# Patient Record
Sex: Female | Born: 1960 | Race: White | Hispanic: No | Marital: Married | Smoking: Former smoker
Health system: Southern US, Community
[De-identification: ages and names within clinical notes are randomized; demographics above are authoritative.]

## PROBLEM LIST (undated history)

## (undated) DIAGNOSIS — M797 Fibromyalgia: Secondary | ICD-10-CM

## (undated) DIAGNOSIS — F430 Acute stress reaction: Secondary | ICD-10-CM

## (undated) DIAGNOSIS — I10 Essential (primary) hypertension: Secondary | ICD-10-CM

## (undated) DIAGNOSIS — F329 Major depressive disorder, single episode, unspecified: Secondary | ICD-10-CM

## (undated) DIAGNOSIS — J329 Chronic sinusitis, unspecified: Secondary | ICD-10-CM

## (undated) DIAGNOSIS — E785 Hyperlipidemia, unspecified: Secondary | ICD-10-CM

## (undated) DIAGNOSIS — K219 Gastro-esophageal reflux disease without esophagitis: Secondary | ICD-10-CM

## (undated) DIAGNOSIS — G4733 Obstructive sleep apnea (adult) (pediatric): Secondary | ICD-10-CM

## (undated) DIAGNOSIS — F32A Depression, unspecified: Secondary | ICD-10-CM

## (undated) DIAGNOSIS — E669 Obesity, unspecified: Secondary | ICD-10-CM

## (undated) DIAGNOSIS — F419 Anxiety disorder, unspecified: Secondary | ICD-10-CM

## (undated) DIAGNOSIS — A6920 Lyme disease, unspecified: Secondary | ICD-10-CM

## (undated) DIAGNOSIS — R918 Other nonspecific abnormal finding of lung field: Secondary | ICD-10-CM

## (undated) DIAGNOSIS — IMO0002 Reserved for concepts with insufficient information to code with codable children: Secondary | ICD-10-CM

## (undated) HISTORY — DX: Chronic sinusitis, unspecified: J32.9

## (undated) HISTORY — DX: Reserved for concepts with insufficient information to code with codable children: IMO0002

## (undated) HISTORY — PX: TUBAL LIGATION: SHX77

## (undated) HISTORY — DX: Fibromyalgia: M79.7

## (undated) HISTORY — DX: Hyperlipidemia, unspecified: E78.5

## (undated) HISTORY — DX: Acute stress reaction: F43.0

## (undated) HISTORY — DX: Obesity, unspecified: E66.9

## (undated) HISTORY — DX: Anxiety disorder, unspecified: F41.9

## (undated) HISTORY — DX: Obstructive sleep apnea (adult) (pediatric): G47.33

## (undated) HISTORY — DX: Gastro-esophageal reflux disease without esophagitis: K21.9

---

## 1997-09-18 ENCOUNTER — Other Ambulatory Visit: Admission: RE | Admit: 1997-09-18 | Discharge: 1997-09-18 | Payer: Self-pay | Admitting: Obstetrics and Gynecology

## 1998-01-19 ENCOUNTER — Emergency Department (HOSPITAL_COMMUNITY): Admission: EM | Admit: 1998-01-19 | Discharge: 1998-01-19 | Payer: Self-pay | Admitting: Emergency Medicine

## 1998-10-21 ENCOUNTER — Other Ambulatory Visit: Admission: RE | Admit: 1998-10-21 | Discharge: 1998-10-21 | Payer: Self-pay | Admitting: Obstetrics & Gynecology

## 1999-01-13 ENCOUNTER — Encounter: Admission: RE | Admit: 1999-01-13 | Discharge: 1999-01-13 | Payer: Self-pay | Admitting: Gastroenterology

## 1999-01-13 ENCOUNTER — Encounter: Payer: Self-pay | Admitting: Gastroenterology

## 1999-03-11 ENCOUNTER — Other Ambulatory Visit: Admission: RE | Admit: 1999-03-11 | Discharge: 1999-03-11 | Payer: Self-pay | Admitting: Obstetrics & Gynecology

## 1999-05-02 ENCOUNTER — Ambulatory Visit (HOSPITAL_COMMUNITY): Admission: RE | Admit: 1999-05-02 | Discharge: 1999-05-02 | Payer: Self-pay | Admitting: Obstetrics & Gynecology

## 1999-05-02 ENCOUNTER — Encounter (INDEPENDENT_AMBULATORY_CARE_PROVIDER_SITE_OTHER): Payer: Self-pay | Admitting: Specialist

## 2000-03-01 HISTORY — PX: LAPAROSCOPY: SHX197

## 2002-04-12 ENCOUNTER — Other Ambulatory Visit: Admission: RE | Admit: 2002-04-12 | Discharge: 2002-04-12 | Payer: Self-pay | Admitting: Obstetrics & Gynecology

## 2003-07-24 ENCOUNTER — Other Ambulatory Visit: Admission: RE | Admit: 2003-07-24 | Discharge: 2003-07-24 | Payer: Self-pay | Admitting: Obstetrics & Gynecology

## 2003-12-23 ENCOUNTER — Ambulatory Visit (HOSPITAL_COMMUNITY): Admission: RE | Admit: 2003-12-23 | Discharge: 2003-12-23 | Payer: Self-pay | Admitting: Pulmonary Disease

## 2004-01-14 ENCOUNTER — Ambulatory Visit: Payer: Self-pay | Admitting: Adult Health

## 2004-06-07 ENCOUNTER — Emergency Department (HOSPITAL_COMMUNITY): Admission: EM | Admit: 2004-06-07 | Discharge: 2004-06-07 | Payer: Self-pay | Admitting: Emergency Medicine

## 2004-08-19 ENCOUNTER — Other Ambulatory Visit: Admission: RE | Admit: 2004-08-19 | Discharge: 2004-08-19 | Payer: Self-pay | Admitting: Obstetrics & Gynecology

## 2004-11-27 ENCOUNTER — Ambulatory Visit: Payer: Self-pay | Admitting: Pulmonary Disease

## 2005-01-24 ENCOUNTER — Emergency Department (HOSPITAL_COMMUNITY): Admission: EM | Admit: 2005-01-24 | Discharge: 2005-01-25 | Payer: Self-pay | Admitting: Emergency Medicine

## 2005-05-31 ENCOUNTER — Ambulatory Visit: Payer: Self-pay | Admitting: Pulmonary Disease

## 2005-12-27 ENCOUNTER — Ambulatory Visit: Payer: Self-pay | Admitting: Pulmonary Disease

## 2006-03-15 ENCOUNTER — Ambulatory Visit: Payer: Self-pay | Admitting: Pulmonary Disease

## 2006-09-13 ENCOUNTER — Ambulatory Visit: Payer: Self-pay | Admitting: Pulmonary Disease

## 2006-09-13 LAB — CONVERTED CEMR LAB
ALT: 25 units/L (ref 0–35)
AST: 20 units/L (ref 0–37)
Albumin: 4.3 g/dL (ref 3.5–5.2)
BUN: 9 mg/dL (ref 6–23)
Basophils Relative: 0.5 % (ref 0.0–1.0)
CO2: 30 meq/L (ref 19–32)
Calcium: 9.6 mg/dL (ref 8.4–10.5)
Chloride: 104 meq/L (ref 96–112)
Creatinine, Ser: 0.9 mg/dL (ref 0.4–1.2)
GFR calc non Af Amer: 72 mL/min
Glucose, Bld: 101 mg/dL — ABNORMAL HIGH (ref 70–99)
Lymphocytes Relative: 21.2 % (ref 12.0–46.0)
MCHC: 35.3 g/dL (ref 30.0–36.0)
MCV: 84.2 fL (ref 78.0–100.0)
Monocytes Absolute: 0.5 10*3/uL (ref 0.2–0.7)
Neutro Abs: 5.1 10*3/uL (ref 1.4–7.7)
RBC: 4.74 M/uL (ref 3.87–5.11)
Sed Rate: 22 mm/hr (ref 0–25)
Sodium: 142 meq/L (ref 135–145)
Total Bilirubin: 0.7 mg/dL (ref 0.3–1.2)
Total Protein: 7.6 g/dL (ref 6.0–8.3)
WBC: 7.2 10*3/uL (ref 4.5–10.5)

## 2006-09-15 ENCOUNTER — Ambulatory Visit: Payer: Self-pay | Admitting: Cardiology

## 2006-09-30 HISTORY — PX: VENTRAL HERNIA REPAIR: SHX424

## 2006-10-26 ENCOUNTER — Ambulatory Visit (HOSPITAL_BASED_OUTPATIENT_CLINIC_OR_DEPARTMENT_OTHER): Admission: RE | Admit: 2006-10-26 | Discharge: 2006-10-26 | Payer: Self-pay | Admitting: General Surgery

## 2007-02-10 ENCOUNTER — Telehealth (INDEPENDENT_AMBULATORY_CARE_PROVIDER_SITE_OTHER): Payer: Self-pay | Admitting: *Deleted

## 2007-03-02 HISTORY — PX: ENDOMETRIAL ABLATION: SHX621

## 2007-03-05 ENCOUNTER — Emergency Department (HOSPITAL_COMMUNITY): Admission: EM | Admit: 2007-03-05 | Discharge: 2007-03-05 | Payer: Self-pay | Admitting: Emergency Medicine

## 2007-04-14 ENCOUNTER — Ambulatory Visit: Payer: Self-pay | Admitting: Pulmonary Disease

## 2007-04-14 DIAGNOSIS — J309 Allergic rhinitis, unspecified: Secondary | ICD-10-CM | POA: Insufficient documentation

## 2007-04-14 DIAGNOSIS — K219 Gastro-esophageal reflux disease without esophagitis: Secondary | ICD-10-CM | POA: Insufficient documentation

## 2007-04-14 LAB — CONVERTED CEMR LAB: Influenza B Ag: NEGATIVE

## 2007-05-22 ENCOUNTER — Ambulatory Visit: Payer: Self-pay | Admitting: Pulmonary Disease

## 2007-05-22 DIAGNOSIS — M797 Fibromyalgia: Secondary | ICD-10-CM | POA: Insufficient documentation

## 2007-05-22 DIAGNOSIS — J4540 Moderate persistent asthma, uncomplicated: Secondary | ICD-10-CM | POA: Insufficient documentation

## 2007-05-22 DIAGNOSIS — E785 Hyperlipidemia, unspecified: Secondary | ICD-10-CM | POA: Insufficient documentation

## 2007-05-22 DIAGNOSIS — IMO0002 Reserved for concepts with insufficient information to code with codable children: Secondary | ICD-10-CM | POA: Insufficient documentation

## 2007-05-23 ENCOUNTER — Encounter: Payer: Self-pay | Admitting: Pulmonary Disease

## 2007-05-24 ENCOUNTER — Ambulatory Visit: Payer: Self-pay | Admitting: Pulmonary Disease

## 2007-06-04 LAB — CONVERTED CEMR LAB
AST: 22 units/L (ref 0–37)
Alkaline Phosphatase: 53 units/L (ref 39–117)
BUN: 9 mg/dL (ref 6–23)
Bilirubin, Direct: 0.1 mg/dL (ref 0.0–0.3)
CO2: 28 meq/L (ref 19–32)
Chloride: 108 meq/L (ref 96–112)
Cholesterol: 201 mg/dL (ref 0–200)
GFR calc non Af Amer: 82 mL/min
HCT: 41.1 % (ref 36.0–46.0)
HDL: 40.6 mg/dL (ref >39.0)
MCV: 84.7 fL (ref 78.0–100.0)
Monocytes Relative: 6.7 % (ref 3.0–11.0)
RBC: 4.85 M/uL (ref 3.87–5.11)
Total Bilirubin: 0.5 mg/dL (ref 0.3–1.2)
Total CHOL/HDL Ratio: 5
Total Protein: 6.9 g/dL (ref 6.0–8.3)
VLDL: 41 mg/dL — ABNORMAL HIGH (ref 0–40)

## 2007-06-06 ENCOUNTER — Encounter (INDEPENDENT_AMBULATORY_CARE_PROVIDER_SITE_OTHER): Payer: Self-pay | Admitting: *Deleted

## 2007-09-14 ENCOUNTER — Ambulatory Visit: Payer: Self-pay | Admitting: Internal Medicine

## 2007-10-12 ENCOUNTER — Telehealth: Payer: Self-pay | Admitting: Pulmonary Disease

## 2008-04-26 ENCOUNTER — Encounter: Payer: Self-pay | Admitting: Pulmonary Disease

## 2008-07-16 ENCOUNTER — Ambulatory Visit: Payer: Self-pay | Admitting: Pulmonary Disease

## 2008-12-26 ENCOUNTER — Ambulatory Visit: Payer: Self-pay | Admitting: Pulmonary Disease

## 2008-12-27 DIAGNOSIS — E559 Vitamin D deficiency, unspecified: Secondary | ICD-10-CM | POA: Insufficient documentation

## 2008-12-27 LAB — CONVERTED CEMR LAB
Basophils Relative: 1.2 % (ref 0.0–3.0)
Bilirubin, Direct: 0.1 mg/dL (ref 0.0–0.3)
CO2: 27 meq/L (ref 19–32)
Calcium: 9 mg/dL (ref 8.4–10.5)
Eosinophils Absolute: 0.2 10*3/uL (ref 0.0–0.7)
GFR calc non Af Amer: 71.16 mL/min (ref >60)
Glucose, Bld: 109 mg/dL — ABNORMAL HIGH (ref 70–99)
HCT: 43.3 % (ref 36.0–46.0)
Hemoglobin, Urine: NEGATIVE
Hemoglobin: 14.8 g/dL (ref 12.0–15.0)
Lymphocytes Relative: 24.3 % (ref 12.0–46.0)
MCV: 88.5 fL (ref 78.0–100.0)
Monocytes Relative: 7.2 % (ref 3.0–12.0)
Nitrite: NEGATIVE
Platelets: 294 10*3/uL (ref 150.0–400.0)
Potassium: 4.1 meq/L (ref 3.5–5.1)
RBC: 4.89 M/uL (ref 3.87–5.11)
Specific Gravity, Urine: >=1.03 (ref 1.000–1.030)
VLDL: 45 mg/dL — ABNORMAL HIGH (ref 0.0–40.0)
WBC: 6.8 10*3/uL (ref 4.5–10.5)
pH: 5.5 (ref 5.0–8.0)

## 2009-03-30 ENCOUNTER — Emergency Department (HOSPITAL_COMMUNITY): Admission: EM | Admit: 2009-03-30 | Discharge: 2009-03-30 | Payer: Self-pay | Admitting: Emergency Medicine

## 2009-04-03 ENCOUNTER — Ambulatory Visit: Payer: Self-pay | Admitting: Pulmonary Disease

## 2009-04-29 ENCOUNTER — Telehealth (INDEPENDENT_AMBULATORY_CARE_PROVIDER_SITE_OTHER): Payer: Self-pay | Admitting: *Deleted

## 2009-04-30 ENCOUNTER — Ambulatory Visit: Payer: Self-pay | Admitting: Pulmonary Disease

## 2009-04-30 DIAGNOSIS — R079 Chest pain, unspecified: Secondary | ICD-10-CM | POA: Insufficient documentation

## 2009-05-02 LAB — CONVERTED CEMR LAB
Basophils Absolute: 0 10*3/uL (ref 0.0–0.1)
CO2: 30 meq/L (ref 19–32)
Chloride: 107 meq/L (ref 96–112)
Creatinine, Ser: 0.8 mg/dL (ref 0.4–1.2)
Eosinophils Absolute: 0.2 10*3/uL (ref 0.0–0.7)
Eosinophils Relative: 2.9 % (ref 0.0–5.0)
Glucose, Bld: 113 mg/dL — ABNORMAL HIGH (ref 70–99)
Hemoglobin: 13.6 g/dL (ref 12.0–15.0)
Hgb A1c MFr Bld: 5.9 % (ref 4.6–6.5)
Lymphocytes Relative: 25.6 % (ref 12.0–46.0)
Monocytes Absolute: 0.4 10*3/uL (ref 0.1–1.0)
Neutrophils Relative %: 63.6 % (ref 43.0–77.0)
Potassium: 4.2 meq/L (ref 3.5–5.1)
RBC: 4.64 M/uL (ref 3.87–5.11)
Sodium: 142 meq/L (ref 135–145)
TSH: 1.72 microintl units/mL (ref 0.35–5.50)
WBC: 5.9 10*3/uL (ref 4.5–10.5)

## 2009-05-08 ENCOUNTER — Ambulatory Visit: Payer: Self-pay | Admitting: Cardiology

## 2009-05-08 DIAGNOSIS — R0602 Shortness of breath: Secondary | ICD-10-CM | POA: Insufficient documentation

## 2009-05-21 ENCOUNTER — Ambulatory Visit: Payer: Self-pay | Admitting: Cardiology

## 2009-05-26 LAB — CONVERTED CEMR LAB
Cholesterol: 164 mg/dL (ref 0–200)
VLDL: 52.6 mg/dL — ABNORMAL HIGH (ref 0.0–40.0)

## 2009-05-28 ENCOUNTER — Encounter: Payer: Self-pay | Admitting: Cardiology

## 2009-05-28 ENCOUNTER — Ambulatory Visit: Payer: Self-pay | Admitting: Cardiology

## 2009-05-28 ENCOUNTER — Ambulatory Visit: Payer: Self-pay

## 2009-05-28 ENCOUNTER — Ambulatory Visit (HOSPITAL_COMMUNITY): Admission: RE | Admit: 2009-05-28 | Discharge: 2009-05-28 | Payer: Self-pay | Admitting: Cardiology

## 2009-05-28 ENCOUNTER — Ambulatory Visit: Payer: Self-pay | Admitting: Internal Medicine

## 2009-05-29 ENCOUNTER — Ambulatory Visit: Payer: Self-pay | Admitting: Cardiology

## 2009-06-05 ENCOUNTER — Ambulatory Visit: Payer: Self-pay | Admitting: Adult Health

## 2009-06-26 ENCOUNTER — Encounter: Payer: Self-pay | Admitting: Pulmonary Disease

## 2009-06-26 ENCOUNTER — Ambulatory Visit (HOSPITAL_BASED_OUTPATIENT_CLINIC_OR_DEPARTMENT_OTHER): Admission: RE | Admit: 2009-06-26 | Discharge: 2009-06-26 | Payer: Self-pay | Admitting: Pulmonary Disease

## 2009-06-28 ENCOUNTER — Ambulatory Visit: Payer: Self-pay | Admitting: Pulmonary Disease

## 2009-07-23 ENCOUNTER — Encounter: Payer: Self-pay | Admitting: Pulmonary Disease

## 2009-07-24 ENCOUNTER — Encounter: Admission: RE | Admit: 2009-07-24 | Discharge: 2009-07-24 | Payer: Self-pay | Admitting: General Surgery

## 2009-08-20 ENCOUNTER — Encounter: Payer: Self-pay | Admitting: Pulmonary Disease

## 2009-08-25 ENCOUNTER — Telehealth: Payer: Self-pay | Admitting: Pulmonary Disease

## 2009-08-29 HISTORY — PX: OTHER SURGICAL HISTORY: SHX169

## 2009-09-08 ENCOUNTER — Ambulatory Visit: Payer: Self-pay | Admitting: Pulmonary Disease

## 2009-09-08 DIAGNOSIS — K439 Ventral hernia without obstruction or gangrene: Secondary | ICD-10-CM | POA: Insufficient documentation

## 2009-09-16 ENCOUNTER — Inpatient Hospital Stay (HOSPITAL_COMMUNITY): Admission: RE | Admit: 2009-09-16 | Discharge: 2009-09-19 | Payer: Self-pay | Admitting: General Surgery

## 2009-09-26 ENCOUNTER — Ambulatory Visit: Payer: Self-pay | Admitting: Pulmonary Disease

## 2009-09-26 ENCOUNTER — Ambulatory Visit: Payer: Self-pay | Admitting: Internal Medicine

## 2009-09-26 ENCOUNTER — Encounter: Payer: Self-pay | Admitting: Adult Health

## 2009-10-22 ENCOUNTER — Encounter: Payer: Self-pay | Admitting: Pulmonary Disease

## 2009-11-12 ENCOUNTER — Encounter: Payer: Self-pay | Admitting: Pulmonary Disease

## 2010-01-09 ENCOUNTER — Encounter: Payer: Self-pay | Admitting: Pulmonary Disease

## 2010-02-06 ENCOUNTER — Encounter
Admission: RE | Admit: 2010-02-06 | Discharge: 2010-02-06 | Payer: Self-pay | Source: Home / Self Care | Attending: General Surgery | Admitting: General Surgery

## 2010-02-11 ENCOUNTER — Encounter: Payer: Self-pay | Admitting: Pulmonary Disease

## 2010-02-26 ENCOUNTER — Telehealth: Payer: Self-pay | Admitting: Pulmonary Disease

## 2010-03-20 ENCOUNTER — Encounter: Payer: Self-pay | Admitting: Pulmonary Disease

## 2010-03-31 NOTE — Assessment & Plan Note (Signed)
Summary: NP follow up   Primary Provider/Referring Provider:  Dr. Murray Hodgkins  CC:  64month follow up - states doing well, is taking amoxicillan for strep throat given by dr. Shirlee Latch.  pt states she has the sensation of something in the back of her throat that feels as though its closing something off when she sneezes and coughs x2weeks, and has been told in the past she may need a sleep study.  History of Present Illness: 50 year old female patient has a known history of asthmatic bronchitis.      April 03, 2009 --Presents for an acute office visit. Pt c/o losing voice last 1 week ago.w/  productive cough with green mucus, nasal drainage clear to light green, S.O.B. Pt states has been in contact with numerous family members with virus. Pt was seen @ [facility] 03/30/09  and was told she has a virus. No tx given. REC for OTC meds. .Cough is getting worse, cant sleep.    April 30, 2009--Presents for an acute office visit. Complains of shortness of breath , fatigue, onset of chest pain x 3 over last 2 weeks lasting about 15 minutes and nausea denies left arm pain. She is under alot of stress w/ work and raising 2 grandchildren-5 yr old/20 months . Last visit w/ asthmatic bronchitis tx w/ omnicef and prednisone taper. She is improving but still has lingering dry cough. Does have intermittent heatburn w/ burning into throat. Lots of family sick w/ virus. Episode occurred yesterday prior to lunch, felt weak,  mid sternal chest pain-gripping pain,  lasted 15 min. took zantac which did helped. Feels tired alot. and thirsty.   June 05, 2009--Presents for 1 month follow up. She is feeling better. chest pain resolved. /w prilosec.  Last visit w/ multiple somatic complaints. She atypical chest pain sent to cards w/ neg stress echo for ischemia, did show mod diastolic dysfunction. EF 60-65%. Recommended to start fish oil. She was tx for GERD w/ Prilosec daily .  Labs were unremarkable and neg for DM . Is  concerned that she has sleep apnea, she snores alot, tired during daytime, feels like she could nap after lunch. Denies chest pain, dyspnea, orthopnea, hemoptysis, fever, n/v/d, edema, headache.   Medications Prior to Update: 1)  Allegra 180 Mg  Tabs (Fexofenadine Hcl) .... Take 1 Tablet By Mouth Once A Day 2)  Advair Diskus 250-50 Mcg/dose  Misc (Fluticasone-Salmeterol) .... Inhale 1 Puff Two Times A Day 3)  Singulair 10 Mg  Tabs (Montelukast Sodium) .... Take 1 Tab By Mouth Once Daily.Marland KitchenMarland Kitchen 4)  Proventil Hfa 108 (90 Base) Mcg/act  Aers (Albuterol Sulfate) .... Inhale 2 Puffs Every 4-6 Hours As Needed For Wheezing... 5)  Cymbalta 60 Mg  Cpep (Duloxetine Hcl) .... Take 1 Tablet By Mouth Once A Day 6)  Nabumetone 750 Mg  Tabs (Nabumetone) .[name].. 1 Tab By Mouth Two Times A Day As Needed For Arthritis Pain... 7)  Vitamin D3 5000 Unit/ml Liqd (Cholecalciferol) .... Take As Directed By Rober Minion... 8)  Ibuprofen 200 Mg Tabs (Ibuprofen) .... Take 1 Tablet By Mouth Every 6 Hours As Needed 9)  Omeprazole 20 Mg Cpdr (Omeprazole) .[name].. 1 By Mouth Once Daily 10)  Fish Oil 1000 Mg Caps (Omega-3 Fatty Acids) .... 2 Daily 11)  Amoxicillin 875 Mg Tabs (Amoxicillin) .... One Tablet Twice A Day For 10days  Current Medications (verified): 1)  Allegra 180 Mg  Tabs (Fexofenadine Hcl) .... Take 1 Tablet By Mouth Once A Day 2)  Advair Diskus 250-50 Mcg/dose  Misc (Fluticasone-Salmeterol) .... Inhale 1 Puff Two Times A Day 3)  Singulair 10 Mg  Tabs (Montelukast Sodium) .... Take 1 Tab By Mouth Once Daily.Marland KitchenMarland Kitchen 4)  Proventil Hfa 108 (90 Base) Mcg/act  Aers (Albuterol Sulfate) .... Inhale 2 Puffs Every 4-6 Hours As Needed For Wheezing... 5)  Cymbalta 60 Mg  Cpep (Duloxetine Hcl) .... Take 1 Tablet By Mouth Once A Day 6)  Nabumetone 750 Mg  Tabs (Nabumetone) .[name].. 1 Tab By Mouth Two Times A Day As Needed For Arthritis Pain... 7)  Vitamin D3 5000 Unit/ml Liqd (Cholecalciferol) .... Take As Directed By Rober Minion... 8)   Ibuprofen 200 Mg Tabs (Ibuprofen) .... Take 1 Tablet By Mouth Every 6 Hours As Needed 9)  Omeprazole 20 Mg Cpdr (Omeprazole) .[name].. 1 By Mouth Once Daily 10)  Fish Oil 1000 Mg Caps (Omega-3 Fatty Acids) .... 2 Daily 11)  Amoxicillin 875 Mg Tabs (Amoxicillin) .... One Tablet Twice A Day For 10days  Allergies (verified): 1)  ! Codeine  Past History:  Family History: Last updated: 05/19/09 Father, estranged- died age 61 w/ Alzheimer's disease. Mother alive age 94 w/ DM, CHF (unsure if she had an MI) 2 Siblings: Brother died age 46 after assault Sister age 83 in good health several half-siblings as well... Grandmother-passed 72 w/ MI Lung cancer in grandmother  Social History: Last updated: 2009-05-19 Married, husb= [place], 65yrs. 2 Children from prev marriage & one daughter on crack She is raising 2 grandkids. Ex-smoker, quit 2006 Social alcohol Caffeine daily  Risk Factors: Smoking Status: quit (04/14/2007)  Past Medical History: 1. hx of SINUSITIS,  (ICD-461.9) 2. ALLERGIC RHINITIS (ICD-477.9) 3. ASTHMA (ICD-493.90) 4. DYSLIPIDEMIA (ICD-272.4) TC 164, HDL 33, LDL 99, TG 262--rx fish oil.  5. GERD (ICD-530.81) 6. DEGENERATIVE DISC DISEASE (ICD-722.6) 7. FIBROMYALGIA (ICD-729.1) 8. VITAMIN D DEFICIENCY (ICD-268.9) 9. STRESS DISORDER (ICD-V62.89) 10.  Obesity 11.  Stress echo (3/11): No evidence for ischemia or infarction.  EF 60-65%, moderate diastolic dysfunction, no significant valvular disease, normal RV.  -TC 164, HDL 33, TG 263, LDL 99 Vital Signs:  Patient profile:   50 year old female Height:      64 inches Weight:      221 pounds BMI:     38.07 O2 Sat:      98 % on Room air Temp:     98.5 degrees F oral Pulse rate:   105 / minute BP sitting:   106 / 72  (left arm) Cuff size:   large  Vitals Entered By: [name] (June 05, 2009 3:04 PM)  O2 Flow:  Room air CC: 56month follow up - states doing well, is taking amoxicillan for strep throat given  by dr. Shirlee Latch.  pt states she has the sensation of something in the back of her throat that feels as though its closing something off when she sneezes and coughs x2weeks, has been told in the past she may need a sleep study Is Patient Diabetic? No Comments Medications reviewed with patient Daytime contact number verified with patient. [name]  June 05, 2009 3:05 PM       Physical Exam  Additional Exam:  WD, Overweight, 50 y/o WF in NAD... GENERAL:  Alert & oriented; pleasant & cooperative... HEENT:  [place]/AT,   EACs-clear, TMs-wnl, NOSE clear , THROAT-clear & wnl. NECK:  Supple w/ full ROM; no JVD; normal carotid impulses w/o bruits; no thyromegaly or nodules palpated; no lymphadenopathy. CHEST:  CTA  bilaterally HEART:  Regular Rhythm; without murmurs/ rubs/ or gallops. ABDOMEN:  Soft & nontender; normal bowel sounds; no organomegaly or masses detected. EXT: without deformities or arthritic changes; no varicose veins/ +venous insuffic/ tr edema.     Impression & Recommendations:  Problem # 1:  GERD (ICD-530.81) Advised on GERD diet.  cont on prilosec once daily  Her updated medication list for this problem includes:    Omeprazole 20 Mg Cpdr (Omeprazole) .[name]... 1 by mouth once daily  Problem # 2:  DYSLIPIDEMIA (ICD-272.4) low fat chol diet.  diet and exercise.  Labs Reviewed: SGOT: 23 (12/26/2008)   SGPT: 28 (12/26/2008)   HDL:33.10 (05/21/2009), 38.70 (12/26/2008)  LDL:DEL (05/24/2007)  Chol:164 (05/21/2009), 217 (12/26/2008)  Trig:263.0 (05/21/2009), 225.0 (12/26/2008)  Problem # 3:  HYPERSOMNIA (ICD-780.54)  Daytime hypersolomence and fatigue will set up for sleep study.   Orders: Est. Patient Level IV (16109) Sleep Disorder Referral (Sleep Disorder)  Complete Medication List: 1)  Allegra 180 Mg Tabs (Fexofenadine hcl) .... Take 1 tablet by mouth once a day 2)  Advair Diskus 250-50 Mcg/dose Misc (Fluticasone-salmeterol) .... Inhale 1 puff two times a day 3)   Singulair 10 Mg Tabs (Montelukast sodium) .... Take 1 tab by mouth once daily.Marland KitchenMarland Kitchen 4)  Proventil Hfa 108 (90 Base) Mcg/act Aers (Albuterol sulfate) .... Inhale 2 puffs every 4-6 hours as needed for wheezing... 5)  Cymbalta 60 Mg Cpep (Duloxetine hcl) .... Take 1 tablet by mouth once a day 6)  Nabumetone 750 Mg Tabs (Nabumetone) .[name].. 1 tab by mouth two times a day as needed for arthritis pain.Marland KitchenMarland Kitchen 7)  Vitamin D3 5000 Unit/ml Liqd (Cholecalciferol) .... Take as directed by drdeveshwar... 8)  Ibuprofen 200 Mg Tabs (Ibuprofen) .... Take 1 tablet by mouth every 6 hours as needed 9)  Omeprazole 20 Mg Cpdr (Omeprazole) .[name].. 1 by mouth once daily 10)  Fish Oil 1000 Mg Caps (Omega-3 fatty acids) .... 2 daily 11)  Amoxicillin 875 Mg Tabs (Amoxicillin) .... One tablet twice a day for 10days  Patient Instructions: 1)  GERD diet 2)  Continue on  Omeprazole 20mg  once daily before meal 3)   We are setting you up a sleep study , I will call with resutls.  4)  follow up Dr. Kriste Basque in 3 months  5)  Increase activity as tolerated 6)  Please contact office for sooner follow up if symptoms do not improve or worsen

## 2010-03-31 NOTE — Letter (Signed)
Summary: [facility]   Imported By: Sherian Rein 08/18/2009 11:57:15  _____________________________________________________________________  External Attachment:    Type:   Image     Comment:   External Document

## 2010-03-31 NOTE — Assessment & Plan Note (Signed)
Summary: NP6/CHEST PAIN/INCREASE RISK FACTOR   Primary Provider:  Dr. Murray Hodgkins   History of Present Illness: 50 yo with history of asthma presents for evaluation of chest pain.  About 1 week ago, while patient was sitting at her desk at work in the morning, she developed a "hard" pain in her central chest.  This was different than her typical GERD, which is a substernal burning with an acid taste in her mouth.  THe pain lasted 10-15 minutes.  She had had coffee but no breakfast prior to the symptoms. She has not had any chest pain since.  She saw her PCP who suggested that she try omeprazole to control GERD, but she has not started it.  Patient also reports dyspnea after walking for 50 yards on flat ground or with climbing a flight of steps.  This has been present for over a year.  The shortness of breath is not associated with wheezing, and she states that her asthma has seemed to be well-controlled.   Labs (10/10): TG 225, LDL 150, HDL 38, TSH normal  ECG: NSR, normal  Current Medications (verified): 1)  Allegra 180 Mg  Tabs (Fexofenadine Hcl) .... Take 1 Tablet By Mouth Once A Day 2)  Advair Diskus 250-50 Mcg/dose  Misc (Fluticasone-Salmeterol) .... Inhale 1 Puff Two Times A Day 3)  Singulair 10 Mg  Tabs (Montelukast Sodium) .... Take 1 Tab By Mouth Once Daily.Marland KitchenMarland Kitchen 4)  Proventil Hfa 108 (90 Base) Mcg/act  Aers (Albuterol Sulfate) .... Inhale 2 Puffs Every 4-6 Hours As Needed For Wheezing... 5)  Cymbalta 60 Mg  Cpep (Duloxetine Hcl) .... Take 1 Tablet By Mouth Once A Day 6)  Nabumetone 750 Mg  Tabs (Nabumetone) .[name].. 1 Tab By Mouth Two Times A Day As Needed For Arthritis Pain... 7)  Vitamin D3 5000 Unit/ml Liqd (Cholecalciferol) .... Take As Directed By Rober Minion... 8)  Ibuprofen 200 Mg Tabs (Ibuprofen) .... Take 1 Tablet By Mouth Every 6 Hours As Needed 9)  Omeprazole 20 Mg Cpdr (Omeprazole) .[name].. 1 By Mouth Once Daily  Allergies (verified): 1)  ! Codeine  Past History:  Past  Medical History: 1. SINUSITIS, ACUTE (ICD-461.9) 2. ALLERGIC RHINITIS (ICD-477.9) 3. ASTHMA (ICD-493.90) 4. DYSLIPIDEMIA (ICD-272.4) 5. GERD (ICD-530.81) 6. DEGENERATIVE DISC DISEASE (ICD-722.6) 7. FIBROMYALGIA (ICD-729.1) 8. VITAMIN D DEFICIENCY (ICD-268.9) 9. STRESS DISORDER (ICD-V62.89) 10.  Obesity  Family History: Father, estranged- died age 53 w/ Alzheimer's disease. Mother alive age 39 w/ DM, CHF (unsure if she had an MI) 2 Siblings: Brother died age 12 after assault Sister age 40 in good health several half-siblings as well... Grandmother-passed 72 w/ MI Lung cancer in grandmother  Social History: Married, husb= [place], 42yrs. 2 Children from prev marriage & one daughter on crack She is raising 2 grandkids. Ex-smoker, quit 2006 Social alcohol Caffeine daily  Review of Systems       All systems reviewed and negative except as noted in HPI.   Vital Signs:  Patient profile:   50 year old female Height:      64 inches Weight:      221 pounds BMI:     38.07 Pulse rate:   93 / minute Resp:     18 per minute BP sitting:   132 / 94  (left arm)  Vitals Entered By: Marrion Coy, CNA (May 08, 2009 11:15 AM)  Physical Exam  General:  Well developed, well nourished, in no acute distress.  Obese.  Head:  normocephalic and atraumatic Nose:  no deformity, discharge, inflammation, or lesions Mouth:  Teeth, gums and palate normal. Oral mucosa normal. Neck:  Neck supple, no JVD. No masses, thyromegaly or abnormal cervical nodes. Lungs:  Clear bilaterally to auscultation and percussion. Heart:  Non-displaced PMI, chest non-tender; regular rate and rhythm, S1, S2 without murmurs, rubs or gallops. Carotid upstroke normal, no bruit. Pedals normal pulses. No edema, no varicosities. Abdomen:  Bowel sounds positive; abdomen soft and non-tender without masses, organomegaly, or hernias noted. No hepatosplenomegaly. Msk:  Back normal, normal gait. Muscle strength and tone  normal. Extremities:  No clubbing or cyanosis. Neurologic:  Alert and oriented x 3. Skin:  Intact without lesions or rashes. Psych:  Normal affect.   Impression & Recommendations:  Problem # 1:  CHEST PAIN (ICD-786.50) Episode of severe atypical chest pain, different than prior GERD. Her only significant risk factor appears to be hyperlipidemia.  I will risk stratify her with an ETT-echo to make sure that there is no evidence for high grade coronary disease.    Problem # 2:  SHORTNESS OF BREATH (ICD-786.05) This could certainly be due to obesity and deconditioning.  I will get an echocardiogram to make sure that the heart is structurally normal.    Problem # 3:  DYSLIPIDEMIA (ICD-272.4) When the patient comes back for echo, we will check fasting lipids.  If still high, I would encourage her to start a statin.    Problem # 4:  OBESITY We spent a long time talking about weight loss and increasing her exercise regimen.    Other Orders: Echocardiogram (Echo) Stress Echo (Stress Echo)  Patient Instructions: 1)  Your physician recommends that you schedule a follow-up appointment in: 2 weeks with Dr. Shirlee Latch. 2)  Your physician recommends that you return for a FASTING lipid profile: the day of your echocardiogram. 3)  Your physician has requested that you have a stress echocardiogram. For further information please visit [email or web address]  Please follow instruction sheet as given. 4)  Your physician has requested that you have an echocardiogram.  Echocardiography is a painless test that uses sound waves to create images of your heart. It provides your doctor with information about the size and shape of your heart and how well your heart's chambers and valves are working.  This procedure takes approximately one hour. There are no restrictions for this procedure.

## 2010-03-31 NOTE — Letter (Signed)
Summary: [facility]   Imported By: Sherian Rein 10/23/2009 07:49:10  _____________________________________________________________________  External Attachment:    Type:   Image     Comment:   External Document

## 2010-03-31 NOTE — Progress Notes (Signed)
Summary: chest pain  Phone Note Call from Patient Call back at [phone]   Caller: Patient Call For: nadel Reason for Call: Talk to Nurse, Talk to Doctor Complaint: Cough/Sore throat, Chest Pain Summary of Call: Would like appt w/SN, c/o chest pains(doesn't feel like Acid Reflux.  Pain waydown in chest sarea towards middle, having coughing fits, get real dizzy, sometimes to the point she has to lay down.  Is 85-90 lbs overweight, real concerned due to high risk of heart attack.  Would like to see SN asap. Initial call taken by: Eugene Gavia,  April 29, 2009 1:14 PM  Follow-up for Phone Call        pt advised she should go to er if she feels these symptoms are acute and can't wait until tomorrow, advised pt we can not determine or r/o if this is cardiac over the phone that she should be seen at the er if symptoms persist or worsen pt wanted ov here for tomorrow--scheduled pt to see tp wed 3/2 at 9am Follow-up by: Philipp Deputy CMA,  April 29, 2009 3:17 PM

## 2010-03-31 NOTE — Letter (Signed)
Summary: [facility]   Imported By: Sherian Rein 11/18/2009 11:14:37  _____________________________________________________________________  External Attachment:    Type:   Image     Comment:   External Document

## 2010-03-31 NOTE — Assessment & Plan Note (Signed)
Summary: sick since w/e/apc   CC:  Pt c/o losing voice last Wednesday, productive cough with green mucus, nasal drainage clear to light green, and S.O.B. Pt states has been in contact with numerous family members with virus. Pt was seen @ [facility] and was told she has a virus.  History of Present Illness: 50 year old female patient has a known history of asthmatic bronchitis.     Jul 16, 2008--Presents for sinus headaches with PND causing prod cough with green sputum, increased SOB, wheezing, chills and sweats for 5 days. Has been out of allegra for few weeks.  Not used any OTC . Coughing is bad, sore throat, cant go to work. Denies chest pain, dyspnea, orthopnea, hemoptysis, fever, n/v/d, edema, headache,recent travel or antibiotics.   April 03, 2009 --Presents for an acute office visit. Pt c/o losing voice last 1 week ago.w/  productive cough with green mucus, nasal drainage clear to light green, S.O.B. Pt states has been in contact with numerous family members with virus. Pt was seen @ [facility] 03/30/09  and was told she has a virus. No tx given. REC for OTC meds. .Cough is getting worse, cant sleep. Denies chest pain, dyspnea, orthopnea, hemoptysis, fever, n/v/d, edema, headache,recent travel or antibiotics   Current Medications (verified): 1)  Allegra 180 Mg  Tabs (Fexofenadine Hcl) .... Take 1 Tablet By Mouth Once A Day 2)  Mucinex Dm 30-600 Mg  Tb12 (Dextromethorphan-Guaifenesin) .[name].. 1-2 Tabs Every 12 Hours As Needed 3)  Advair Diskus 250-50 Mcg/dose  Misc (Fluticasone-Salmeterol) .... Inhale 1 Puff Two Times A Day 4)  Singulair 10 Mg  Tabs (Montelukast Sodium) .... Take 1 Tab By Mouth Once Daily.Marland KitchenMarland Kitchen 5)  Proventil Hfa 108 (90 Base) Mcg/act  Aers (Albuterol Sulfate) .... Inhale 2 Puffs Every 4-6 Hours As Needed For Wheezing... 6)  Cymbalta 60 Mg  Cpep (Duloxetine Hcl) .... Take 1 Tablet By Mouth Once A Day 7)  Nabumetone 750 Mg  Tabs (Nabumetone) .[name].. 1 Tab By Mouth Two Times A Day As Needed For  Arthritis Pain... 8)  Vitamin D3 5000 Unit/ml Liqd (Cholecalciferol) .... Take As Directed By Rober Minion... 9)  Ibuprofen 200 Mg Tabs (Ibuprofen) .... Take 1 Tablet By Mouth Every 6 Hours As Needed  Allergies (verified): 1)  ! Codeine  Past History:  Past Medical History: Last updated: 12/26/2008 SINUSITIS, ACUTE (ICD-461.9) PHYSICAL EXAMINATION (ICD-V70.0) ALLERGIC RHINITIS (ICD-477.9) ASTHMA (ICD-493.90) DYSLIPIDEMIA (ICD-272.4) GERD (ICD-530.81) DEGENERATIVE DISC DISEASE (ICD-722.6) FIBROMYALGIA (ICD-729.1) VITAMIN D DEFICIENCY (ICD-268.9) STRESS DISORDER (ICD-V62.89)  Past Surgical History: Last updated: 12/26/2008 S/P ventral hernia repair by DrThompson 8/08  Review of Systems      See HPI  Vital Signs:  Patient profile:   50 year old female Height:      64 inches Weight:      224.13 pounds O2 Sat:      98 % on Room air Temp:     98.0 degrees F oral Pulse rate:   117 / minute BP sitting:   138 / 82  (left arm) Cuff size:   large  Vitals Entered By: Zackery Barefoot CMA (April 03, 2009 9:50 AM)  O2 Flow:  Room air CC: Pt c/o losing voice last Wednesday, productive cough with green mucus, nasal drainage clear to light green, S.O.B. Pt states has been in contact with numerous family members with virus. Pt was seen @ [facility] and was told she has a virus Comments Medications reviewed with patient Verified pt's contact number Zackery Barefoot CMA  April 03, 2009 9:52 AM    Physical Exam  Additional Exam:  WD, Overweight, 50 y/o WF in NAD... GENERAL:  Alert & oriented; pleasant & cooperative... HEENT:  [place]/AT,   EACs-clear, TMs-wnl, NOSE-red mucosa, max tenderness, THROAT-clear & wnl. NECK:  Supple w/ full ROM; no JVD; normal carotid impulses w/o bruits; no thyromegaly or nodules palpated; no lymphadenopathy. CHEST:  Coarse BS bilaterally HEART:  Regular Rhythm; without murmurs/ rubs/ or gallops. ABDOMEN:  Soft & nontender; normal bowel sounds; no  organomegaly or masses detected. EXT: without deformities or arthritic changes; no varicose veins/ +venous insuffic/ tr edema.     Impression & Recommendations:  Problem # 1:  ASTHMA (ICD-493.90)  Slow to resolve exacerbation REC:  Omnicef 300mg  two times a day for 7 days Muicnex DM two times a day as needed cough/congestion Prednsione taper over next week.  Hydromet 1-2 tsp every 4-6 hr as needed cough/congestion  Please contact office for sooner follow up if symptoms do not improve or worsen   Her updated medication list for this problem includes:    Mucinex Dm 30-600 Mg Tb12 (Dextromethorphan-guaifenesin) .[name]... 1-2 tabs every 12 hours as needed    Cefdinir 300 Mg Caps (Cefdinir) .[name]... 1 by mouth two times a day    Hydromet 5-1.5 Mg/43ml Syrp (Hydrocodone-homatropine) .[name]... 1-2 tsp every 4-6 hr as needed cough  Orders: Est. Patient Level IV ([address])  Medications Added to Medication List This Visit: 1)  Ibuprofen 200 Mg Tabs (Ibuprofen) .... Take 1 tablet by mouth every 6 hours as needed 2)  Cefdinir 300 Mg Caps (Cefdinir) .[name].. 1 by mouth two times a day 3)  Prednisone 10 Mg Tabs (Prednisone) .... 4 tabs for 2 days, then 3 tabs for 2 days, 2 tabs for 2 days, then 1 tab for 2 days, then stop 4)  Hydromet 5-1.5 Mg/34ml Syrp (Hydrocodone-homatropine) .[name].. 1-2 tsp every 4-6 hr as needed cough  Complete Medication List: 1)  Allegra 180 Mg Tabs (Fexofenadine hcl) .... Take 1 tablet by mouth once a day 2)  Mucinex Dm 30-600 Mg Tb12 (Dextromethorphan-guaifenesin) .[name].. 1-2 tabs every 12 hours as needed 3)  Advair Diskus 250-50 Mcg/dose Misc (Fluticasone-salmeterol) .... Inhale 1 puff two times a day 4)  Singulair 10 Mg Tabs (Montelukast sodium) .... Take 1 tab by mouth once daily.Marland KitchenMarland Kitchen 5)  Proventil Hfa 108 (90 Base) Mcg/act Aers (Albuterol sulfate) .... Inhale 2 puffs every 4-6 hours as needed for wheezing... 6)  Cymbalta 60 Mg Cpep (Duloxetine hcl) .... Take 1 tablet by mouth once a day 7)   Nabumetone 750 Mg Tabs (Nabumetone) .[name].. 1 tab by mouth two times a day as needed for arthritis pain.Marland KitchenMarland Kitchen 8)  Vitamin D3 5000 Unit/ml Liqd (Cholecalciferol) .... Take as directed by drdeveshwar... 9)  Ibuprofen 200 Mg Tabs (Ibuprofen) .... Take 1 tablet by mouth every 6 hours as needed 10)  Cefdinir 300 Mg Caps (Cefdinir) .[name].. 1 by mouth two times a day 11)  Prednisone 10 Mg Tabs (Prednisone) .... 4 tabs for 2 days, then 3 tabs for 2 days, 2 tabs for 2 days, then 1 tab for 2 days, then stop 12)  Hydromet 5-1.5 Mg/18ml Syrp (Hydrocodone-homatropine) .[name].. 1-2 tsp every 4-6 hr as needed cough  Patient Instructions: 1)  Omnicef 300mg  two times a day for 7 days 2)  Muicnex DM two times a day as needed cough/congestion 3)  Prednsione taper over next week.  4)  Hydromet 1-2 tsp every 4-6 hr as needed cough/congestion  5)  Please contact office for sooner follow up if symptoms do not improve or worsen  Prescriptions: HYDROMET 5-1.5 MG/5ML SYRP (HYDROCODONE-HOMATROPINE) 1-2 tsp every 4-6 hr as needed cough  #8 oz x 0   Entered and Authorized by:   Rubye Oaks NP   Signed by:   Ray Gervasi NP on 04/03/2009   Method used:   Print then Give to Patient   RxID:   [id number] PREDNISONE 10 MG TABS (PREDNISONE) 4 tabs for 2 days, then 3 tabs for 2 days, 2 tabs for 2 days, then 1 tab for 2 days, then stop  #20 x 0   Entered and Authorized by:   Rubye Oaks NP   Signed by:   Kenza Munar NP on 04/03/2009   Method used:   Electronically to        [facility]  [address] [id number] (retail)       [address]       [place], [place]  [address]       Ph: [phone] or [phone]       Fax: [phone]   RxID:   [id number] CEFDINIR 300 MG CAPS (CEFDINIR) 1 by mouth two times a day  #14 x 0   Entered and Authorized by:   Rubye Oaks NP   Signed by:   Kiearra Oyervides NP on 04/03/2009   Method used:   [facility]ronically to        [facility]  [address] [id number] (retail)       [address]       [place], [place]  [address]       Ph: [phone] or [phone]       Fax: [phone]   RxID:   [id number]     Appended Document: Orders Update    Clinical Lists Changes  Orders: Added new Service order of Xopenex 1.25mg  [phone]) - Signed Added new Service order of Nebulizer Tx ([phone]) - Signed       Medication Administration  Medication # 1:    Medication: Xopenex 1.25mg     Diagnosis: ASTHMA (ICD-493.90)    Dose: 1 VIAL    Route: inhaled    Exp Date: 09-11    Lot #: [id number]    Mfr: SEPRACOR    Patient tolerated medication without complications    Given by: Zackery Barefoot CMA (April 03, 2009 10:33 AM)  Orders Added: 1)  Xopenex 1.25mg  [O2703] 2)  Nebulizer Tx [phone]

## 2010-03-31 NOTE — Assessment & Plan Note (Signed)
Summary: follow up///JJ   Primary Care Provider:  Dr. Murray Hodgkins  CC:  8-9 month ROV & review of mult medical problems....  History of Present Illness: 50 y/o WF here for a follow up visit... she has several medical issues as noted below... Followed for general medical purposes w/ hx of Asthma (ex-smoker quit 2006), Dyslipidiemia, GERD, DJD/ DDD/ FM, and Anxiety... she is also followed by DrDeveshwar for Rheum, DrMcLean for Cards, & DrThompson for [facility]...   ~  December 26, 2008:  when last seen in Mar09 she was sent to [facility] for FM & placed on Cymbalta... she still follows up w/ DrD & ?last seen 2/10- for the FM & Lumbago on Cymbalta 60, Flexeril 10mg  Prn, and Relafen Prn...  also found to have a low Vit d level- treated w/ 50000 u weekly for awhile, then changed to 5000 u twice daily (Vit D levels & Rx per DrDeveshwar per pt)...  she has not had any asthma exac; unfortunately her weight is up 221# & we discussed diet/ exercise...   ~  September 08, 2009:  she saw DrThompson at [facility] for persistant ventral hernia complaints & is sched for re=do w/ mesh next week... she had a Sleep Study 4/11 for hypersomnia- AHI= 3, loud snoring, & rec for weight reduction... she also had cards eval DrMcLean for CP w/ norm EKG> felt to be atyp GERD & improved w/ Omep;  she had a StressEcho which was neg- no ischemia, normal EF, mod DD noted...   Current Problems:   ALLERGIC RHINITIS (ICD-477.9) - on ALLEGRA 180/d- states she needs it daily!  ASTHMA (ICD-493.90) - on ADVAIR 250Bid, SINGULAIR 10mg /d, PROVENTIL HFA as needed, & MUCINEX 1-2 Bid... she had an asthma flair in Feb09 due to exposure w/ grandson in daycare rx'd w/ Levaquin and Pred and improved...   CHEST PAIN (ICD-786.50) - Cardiac eval 3/11 by DrMcLean for atyp CP> symptoms improved w/ PPI Rx.  ~  EKG showed NSR, WNL...  ~  2DECho showed normal LVF w/ EF= 60-65%, norm wall motion, gr 2 DD, valves normal.  ~  Stress Echo was neg- no  ischemia... DYSLIPIDEMIA (ICD-272.4) - on diet + FISH OIL but unfortunately her weight is unchanged at 221#  ~  last FLP 10/07 showed TChol 181, TG 117, HDL 35, LDL 123... rec for better diet & exercise program.  ~  FLP 3/09 (wt=207#) showed TChol 201, TG 204, HDL 41, LDL 128... she prefers diet Rx.  ~  FLP 10/10 (wt=221#) showed TChol 217, TG 225, HDL 39, LDL 150... offered med Rx.  OBESITY (ICD-278.00) - she weighs  ~220# and is 64" tall for a BMI= 38... we discussed diet & exercise program.  ~  7/11:  weight remains 222# & she blames the Cymbalta for her lack of wt reduction.  GERD (ICD-530.81) - on OMEPRAZOLE 20mg /d w/ improvement... no prev eval and symptoms are minimal at present... we discussed the assoc of reflux to asthma...  DEGENERATIVE DISC DISEASE (ICD-722.6) - she is followed by DrDeveshwar for Rheum...she notes "a really bad spell" w/ her FM recently and "my back locks up on me alot"... she has used Nabumetone, and Parafon in the past...  FIBROMYALGIA (ICD-729.1) - DrNeal has given her Ambien to help w/ her sleep pattern... last note from DrDeveshwar 2/10 indicated pt on CYMBALTA 60mg /d & muscle relaxer... she has been referred to [facility] in the past...  VITAMIN D DEFICIENCY (ICD-268.9) - Vit D level here 3/09 was  13 and started on 50K weekly... pt indicates that DrDeveshwar has been following her Vit D level since then and switched her to 5000 u two times a day...  STRESS DISORDER (ICD-V62.89) - she is not currently on anxiolytic therapy.  HEALTH MAINT --- GYN= DrNeal & he does mammograms in his office & BMD as well...   Preventive Screening-Counseling & Management  Alcohol-Tobacco     Smoking Status: quit     Year Quit: 2006     Pack years: 56yrs, less than 1ppd  Allergies: 1)  ! Codeine  Comments:  Nurse/Medical Assistant: The patient's medications and allergies were reviewed with the patient and were updated in the Medication and Allergy  Lists.  Past History:  Past Medical History: 1. hx of SINUSITIS,  (ICD-461.9) 2. ALLERGIC RHINITIS (ICD-477.9) 3. ASTHMA (ICD-493.90) 4. DYSLIPIDEMIA (ICD-272.4) 5. GERD (ICD-530.81) 6. DEGENERATIVE DISC DISEASE (ICD-722.6) 7. FIBROMYALGIA (ICD-729.1) 8. VITAMIN D DEFICIENCY (ICD-268.9) 9. STRESS DISORDER (ICD-V62.89) 10.  Obesity 11.  Stress echo (3/11): No evidence for ischemia or infarction.  EF 60-65%, moderate diastolic dysfunction, no significant valvular disease, normal RV.  12. ?OSA-obese, snoring>>sleep study 06/26/09>>sm. no. of obstructive events, does not meet AHI criteria, she had loud snoring, recommended on aggressive weight loss.   Past Surgical History: S/P ventral hernia repair by DrThompson 8/08 Laparoscopy 2002 Endometrial Ablation 2009  Family History: Reviewed history from 05/08/2009 and no changes required. Father, estranged- died age 52 w/ Alzheimer's disease. Mother alive age 31 w/ DM, CHF (unsure if she had an MI) 2 Siblings: Brother died age 36 after assault Sister age 61 in good health several half-siblings as well... Grandmother-passed 72 w/ MI Lung cancer in grandmother  Social History: Reviewed history from 05/08/2009 and no changes required. Married, husb= [place], 46yrs. 2 Children from prev marriage & one daughter on crack She is raising 2 grandkids. Ex-smoker, quit 2006 Social alcohol Caffeine daily  Review of Systems      See HPI  The patient denies anorexia, fever, weight loss, weight gain, vision loss, decreased hearing, hoarseness, chest pain, syncope, dyspnea on exertion, peripheral edema, prolonged cough, headaches, hemoptysis, abdominal pain, melena, hematochezia, severe indigestion/heartburn, hematuria, incontinence, muscle weakness, suspicious skin lesions, transient blindness, difficulty walking, depression, unusual weight change, abnormal bleeding, enlarged lymph nodes, and angioedema.    Vital Signs:  Patient profile:   50  year old female Height:      64 inches Weight:      221.50 pounds BMI:     38.16 O2 Sat:      97 % on Room air Temp:     98.5 degrees F oral Pulse rate:   105 / minute BP sitting:   138 / 84  (right arm) Cuff size:   regular  Vitals Entered By: [name] CMA (September 08, 2009 3:09 PM)  O2 Sat at Rest %:  97 O2 Flow:  Room air CC: 8-9 month ROV & review of mult medical problems... Is Patient Diabetic? No Pain Assessment Patient in pain? yes      Onset of pain  abd hernia--having surgery on monday--7-18 Comments meds updated today with pt   Physical Exam  Additional Exam:  WD, Overweight, 50 y/o WF in NAD... GENERAL:  Alert & oriented; pleasant & cooperative... HEENT:  [place]/AT, EOM-wnl, PERRLA, EACs-clear, TMs-wnl, NOSE-clear, THROAT-clear & wnl. NECK:  Supple w/ full ROM; no JVD; normal carotid impulses w/o bruits; no thyromegaly or nodules palpated; no lymphadenopathy. CHEST:  Clear to P & A; without wheezes/  rales/ or rhonchi. HEART:  Regular Rhythm; without murmurs/ rubs/ or gallops. ABDOMEN:  Obese, soft & nontender; normal bowel sounds; no organomegaly or masses detected. EXT: without deformities or arthritic changes; no varicose veins/ +venous insuffic/ tr edema. NEURO:  CN's intact;  no focal neuro deficits;  + trigger points on exam... DERM:  No lesions noted; no rash etc...    Impression & Recommendations:  Problem # 1:  ASTHMA (ICD-493.90) She is stable>  we discussed decreasing the Advair to 100-50 Bid... Her updated medication list for this problem includes:    Advair Diskus 100-50 Mcg/dose Aepb (Fluticasone-salmeterol) .[name]... 1 inhalation two times a day...    Singulair 10 Mg Tabs (Montelukast sodium) .[name]... Take 1 tab by mouth once daily...    Proventil Hfa 108 (90 Base) Mcg/act Aers (Albuterol sulfate) ..... Inhale 2 puffs every 4-6 hours as needed for wheezing...  Problem # 2:  CHEST PAIN (ICD-786.50) Neg Cards w/u by DrMcLean...  Problem # 3:   DYSLIPIDEMIA (ICD-272.4) She is not adeq managed on diet alone but has resisted starting medication... we discussed on more opportunity to lose weight & then reconsider meds if she is unsuccessful w/ weight reduction...  Problem # 4:  GERD (ICD-530.81) Symptoms improved w/ OMEP 20mg /d... Her updated medication list for this problem includes:    Omeprazole 20 Mg Cpdr (Omeprazole) .[name]... 1 by mouth once daily  Problem # 5:  FIBROMYALGIA (ICD-729.1) She will discuss the Cymbalta w/ DrDeveshwar & call for f/u visit... The following medications were removed from the medication list:    Ibuprofen 200 Mg Tabs (Ibuprofen) .[name]... Take 1 tablet by mouth every 6 hours as needed Her updated medication list for this problem includes:    Nabumetone 750 Mg Tabs (Nabumetone) .[name]... 1 tab by mouth two times a day as needed for arthritis pain...  Problem # 6:  VITAMIN D DEFICIENCY (ICD-268.9) She continues Vit D supplementation per DrDeveshwar...  Problem # 7:  OTHER MEDICAL PROBLEMS AS NOTED>>>  Complete Medication List: 1)  Allegra 180 Mg Tabs (Fexofenadine hcl) .... Take 1 tablet by mouth once a day 2)  Advair Diskus 100-50 Mcg/dose Aepb (Fluticasone-salmeterol) .[name].. 1 inhalation two times a day... 3)  Singulair 10 Mg Tabs (Montelukast sodium) .... Take 1 tab by mouth once daily.Marland KitchenMarland Kitchen 4)  Proventil Hfa 108 (90 Base) Mcg/act Aers (Albuterol sulfate) .... Inhale 2 puffs every 4-6 hours as needed for wheezing... 5)  Fish Oil 1000 Mg Caps (Omega-3 fatty acids) .... 2 daily 6)  Omeprazole 20 Mg Cpdr (Omeprazole) .[name].. 1 by mouth once daily 7)  Cymbalta 60 Mg Cpep (Duloxetine hcl) .... Take 1 tablet by mouth once a day 8)  Nabumetone 750 Mg Tabs (Nabumetone) .[name].. 1 tab by mouth two times a day as needed for arthritis pain...  Patient Instructions: 1)  Today we updated your med list- see below.... 2)  We refilled your meds per request... 3)  Good luck on the up-coming hernia surg... 4)  Call for any  problems.Marland KitchenMarland Kitchen 5)  Please schedule a follow-up appointment in 6 months. Prescriptions: OMEPRAZOLE 20 MG CPDR (OMEPRAZOLE) 1 by mouth once daily  #30 x 12   Entered and Authorized by:   Michele Mcalpine MD   Signed by:   Michele Mcalpine MD on 09/08/2009   Method used:   Print then Give to Patient   RxID:   [id number] NABUMETONE 750 MG  TABS (NABUMETONE) 1 tab by mouth two times a day as needed for arthritis  pain.Marland KitchenMarland Kitchen  #60 x 12   Entered and Authorized by:   Michele Mcalpine MD   Signed by:   Michele Mcalpine MD on 09/08/2009   Method used:   Print then Give to Patient   RxID:   [id number] PROVENTIL HFA 108 (90 BASE) MCG/ACT  AERS (ALBUTEROL SULFATE) Inhale 2 puffs every 4-6 hours as needed for wheezing...  #1 x 12   Entered and Authorized by:   Michele Mcalpine MD   Signed by:   Michele Mcalpine MD on 09/08/2009   Method used:   Print then Give to Patient   RxID:   [id number] SINGULAIR 10 MG  TABS (MONTELUKAST SODIUM) Take 1 tab by mouth once daily...  #30 x 12   Entered and Authorized by:   Michele Mcalpine MD   Signed by:   Michele Mcalpine MD on 09/08/2009   Method used:   Print then Give to Patient   RxID:   [id number] ADVAIR DISKUS 100-50 MCG/DOSE AEPB (FLUTICASONE-SALMETEROL) 1 inhalation two times a day...  #1 x 12   Entered and Authorized by:   Michele Mcalpine MD   Signed by:   Michele Mcalpine MD on 09/08/2009   Method used:   Print then Give to Patient   RxID:   [id number] ALLEGRA 180 MG  TABS (FEXOFENADINE HCL) Take 1 tablet by mouth once a day  #30 x 12   Entered and Authorized by:   Michele Mcalpine MD   Signed by:   Michele Mcalpine MD on 09/08/2009   Method used:   Print then Give to Patient   RxID:   [id number]

## 2010-03-31 NOTE — Miscellaneous (Signed)
  Clinical Lists Changes  Orders: Added new Service order of Nebulizer Tx ([id number]) - Signed      Medication Administration  Medication # 1:    Medication: Xopenex 1.25mg     Diagnosis: ASTHMA (ICD-493.90)    Dose: 1 vial    Route: inhaled    Exp Date: 10/30/2009    Lot #: [id number]    Mfr: sepracor    Patient tolerated medication without complications    Given by: Kandice Hams CMA (September 26, 2009 4:21 PM)  Orders Added: 1)  Nebulizer Tx [phone]

## 2010-03-31 NOTE — Letter (Signed)
Summary: [facility]   Imported By: Sherian Rein 01/30/2010 14:00:53  _____________________________________________________________________  External Attachment:    Type:   Image     Comment:   External Document

## 2010-03-31 NOTE — Letter (Signed)
Summary: [facility]   Imported By: Lennie Odor 11/26/2009 11:21:14  _____________________________________________________________________  External Attachment:    Type:   Image     Comment:   External Document

## 2010-03-31 NOTE — Assessment & Plan Note (Signed)
Summary: f/u echo and stress echo done 05-28-09   Primary Provider:  Dr. Murray Hodgkins  CC:  f/u echo and stress echo done 05-28-09.  Pt complains of sore throat and possible strep throat infection.  History of Present Illness: 50 yo with history of asthma presents for evaluation of chest pain.  About 1 week prior to last appointment, while patient was sitting at her desk at work in the morning, she developed a "hard" pain in her central chest.  This was different than her typical GERD, which is a substernal burning with an acid taste in her mouth.  THe pain lasted 10-15 minutes.  She had had coffee but no breakfast prior to the symptoms. She has not had any chest pain since.  Patient also reports dyspnea after walking for 50 yards on flat ground or with climbing a flight of steps.  This has been present for over a year.  The shortness of breath is not associated with wheezing, and she states that her asthma has seemed to be well-controlled.   Since last appointment, patient had a stress echo.  There was no evidence for ischemia.  EF was normal.  There was moderate diastolic dysfunction.  I started her on omeprazole and she has had no recurrence of chest pain.   Main complaint today is a severe sore throat.  Her husband and grandson have documented Strep pharyngitis.  She has tender nodes in her neck.  No cough. She is not sure if she is running a fever.    Labs (10/10): TG 225, LDL 150, HDL 38, TSH normal Labs (3/11): HDL 33, LDL 99.5, TGs 263  Current Medications (verified): 1)  Allegra 180 Mg  Tabs (Fexofenadine Hcl) .... Take 1 Tablet By Mouth Once A Day 2)  Advair Diskus 250-50 Mcg/dose  Misc (Fluticasone-Salmeterol) .... Inhale 1 Puff Two Times A Day 3)  Singulair 10 Mg  Tabs (Montelukast Sodium) .... Take 1 Tab By Mouth Once Daily.Marland KitchenMarland Kitchen 4)  Proventil Hfa 108 (90 Base) Mcg/act  Aers (Albuterol Sulfate) .... Inhale 2 Puffs Every 4-6 Hours As Needed For Wheezing... 5)  Cymbalta 60 Mg  Cpep  (Duloxetine Hcl) .... Take 1 Tablet By Mouth Once A Day 6)  Nabumetone 750 Mg  Tabs (Nabumetone) .[name].. 1 Tab By Mouth Two Times A Day As Needed For Arthritis Pain... 7)  Vitamin D3 5000 Unit/ml Liqd (Cholecalciferol) .... Take As Directed By Rober Minion... 8)  Ibuprofen 200 Mg Tabs (Ibuprofen) .... Take 1 Tablet By Mouth Every 6 Hours As Needed 9)  Omeprazole 20 Mg Cpdr (Omeprazole) .[name].. 1 By Mouth Once Daily 10)  Fish Oil 1000 Mg Caps (Omega-3 Fatty Acids) .... 2 Daily 11)  Amoxicillin 875 Mg Tabs (Amoxicillin) .... One Tablet Twice A Day For 10days  Allergies (verified): 1)  ! Codeine  Past History:  Past Medical History: 1. SINUSITIS, ACUTE (ICD-461.9) 2. ALLERGIC RHINITIS (ICD-477.9) 3. ASTHMA (ICD-493.90) 4. DYSLIPIDEMIA (ICD-272.4) 5. GERD (ICD-530.81) 6. DEGENERATIVE DISC DISEASE (ICD-722.6) 7. FIBROMYALGIA (ICD-729.1) 8. VITAMIN D DEFICIENCY (ICD-268.9) 9. STRESS DISORDER (ICD-V62.89) 10.  Obesity 11.  Stress echo (3/11): No evidence for ischemia or infarction.  EF 60-65%, moderate diastolic dysfunction, no significant valvular disease, normal RV.   Family History: Reviewed history from 05/08/2009 and no changes required. Father, estranged- died age 35 w/ Alzheimer's disease. Mother alive age 60 w/ DM, CHF (unsure if she had an MI) 2 Siblings: Brother died age 38 after assault Sister age 28 in good health several half-siblings as well.Marland KitchenMarland Kitchen  Grandmother-passed 72 w/ MI Lung cancer in grandmother  Social History: Reviewed history from 05/08/2009 and no changes required. Married, husb= [place], 7yrs. 2 Children from prev marriage & one daughter on crack She is raising 2 grandkids. Ex-smoker, quit 2006 Social alcohol Caffeine daily  Vital Signs:  Patient profile:   50 year old female Height:      64 inches Weight:      222 pounds BMI:     38.24 Pulse rate:   101 / minute Pulse rhythm:   regular BP sitting:   145 / 92  (left arm) Cuff size:   large  Vitals  Entered By: Judithe Modest CMA (May 29, 2009 4:40 PM)  Physical Exam  General:  Well developed, well nourished, in no acute distress.  Obese.  Mouth:  Erythematous oropharynx.  Neck:  Neck supple, no JVD. No masses, thyromegaly. Tender anterior cervical nodes.  Lungs:  Clear bilaterally to auscultation and percussion. Heart:  Non-displaced PMI, chest non-tender; regular rate and rhythm, S1, S2 without murmurs, rubs or gallops. Carotid upstroke normal, no bruit. Pedals normal pulses. No edema, no varicosities. Abdomen:  Bowel sounds positive; abdomen soft and non-tender without masses, organomegaly, or hernias noted. No hepatosplenomegaly. Extremities:  No clubbing or cyanosis. Neurologic:  Alert and oriented x 3. Psych:  Normal affect.   Impression & Recommendations:  Problem # 1:  CHEST PAIN (ICD-786.50) I suspect that the patient's symptoms were due to GERD.  Chest pain has resolved on omeprazole.  Stress echo showed no evidence for ischemia or infarction.   Problem # 2:  PHARYNGITIS Patient has a sore, erythematous oropharynx on exam and has tender enlarged anterior cervical nodes.  Given her husband and grandson with documented Strep pharyngitis, I suspect that she has the same.  Will treat with amoxicillin 875 mg two times a day x 10 days.   Problem # 3:  DYSLIPIDEMIA (ICD-272.4) LDL is much better.  HDL low and triglycerides high.  I recommended that she start 2000 mg daily fish oil and increase her exercise level.   Patient Instructions: 1)  Your physician has recommended you make the following change in your medication:  2)  Take Amoxicillin 875mg  twice a day for 10days 3)  Start Fish Oil 2000mg  daily 4)  Your physician recommends that you schedule a follow-up appointment as needed with Dr Shirlee Latch.

## 2010-03-31 NOTE — Assessment & Plan Note (Signed)
Summary: chest hurts//td   CC:  Acute Visit pt clo shortness of breath , fatigue, and onset of chest pain x 3 over last 2 weeks lasting about 15 minutes and nausea denies left arm pain.  History of Present Illness: 50 year old female patient has a known history of asthmatic bronchitis.     Jul 16, 2008--Presents for sinus headaches with PND causing prod cough with green sputum, increased SOB, wheezing, chills and sweats for 5 days. Has been out of allegra for few weeks.  Not used any OTC . Coughing is bad, sore throat, cant go to work. Denies chest pain, dyspnea, orthopnea, hemoptysis, fever, n/v/d, edema, headache,recent travel or antibiotics.   April 03, 2009 --Presents for an acute office visit. Pt c/o losing voice last 1 week ago.w/  productive cough with green mucus, nasal drainage clear to light green, S.O.B. Pt states has been in contact with numerous family members with virus. Pt was seen @ [facility] 03/30/09  and was told she has a virus. No tx given. REC for OTC meds. .Cough is getting worse, cant sleep.    April 30, 2009--Presents for an acute office visit. Complains of shortness of breath , fatigue, onset of chest pain x 3 over last 2 weeks lasting about 15 minutes and nausea denies left arm pain. She is under alot of stress w/ work and raising 2 grandchildren-5 yr old/20 months . Last visit w/ asthmatic bronchitis tx w/ omnicef and prednisone taper. She is improving but still has lingering dry cough. Does have intermittent heatburn w/ burning into throat. Lots of family sick w/ virus. Episode occurred yesterday prior to lunch, felt weak,  mid sternal chest pain-gripping pain,  lasted 15 min. took zantac which did helped. Feels tired alot. and thirsty.   Medications Prior to Update: 1)  Allegra 180 Mg  Tabs (Fexofenadine Hcl) .... Take 1 Tablet By Mouth Once A Day 2)  Mucinex Dm 30-600 Mg  Tb12 (Dextromethorphan-Guaifenesin) .[name].. 1-2 Tabs Every 12 Hours As Needed 3)  Advair Diskus 250-50  Mcg/dose  Misc (Fluticasone-Salmeterol) .... Inhale 1 Puff Two Times A Day 4)  Singulair 10 Mg  Tabs (Montelukast Sodium) .... Take 1 Tab By Mouth Once Daily.Marland KitchenMarland Kitchen 5)  Proventil Hfa 108 (90 Base) Mcg/act  Aers (Albuterol Sulfate) .... Inhale 2 Puffs Every 4-6 Hours As Needed For Wheezing... 6)  Cymbalta 60 Mg  Cpep (Duloxetine Hcl) .... Take 1 Tablet By Mouth Once A Day 7)  Nabumetone 750 Mg  Tabs (Nabumetone) .[name].. 1 Tab By Mouth Two Times A Day As Needed For Arthritis Pain... 8)  Vitamin D3 5000 Unit/ml Liqd (Cholecalciferol) .... Take As Directed By Rober Minion... 9)  Ibuprofen 200 Mg Tabs (Ibuprofen) .... Take 1 Tablet By Mouth Every 6 Hours As Needed 10)  Cefdinir 300 Mg Caps (Cefdinir) .[name].. 1 By Mouth Two Times A Day 11)  Prednisone 10 Mg Tabs (Prednisone) .... 4 Tabs For 2 Days, Then 3 Tabs For 2 Days, 2 Tabs For 2 Days, Then 1 Tab For 2 Days, Then Stop 12)  Hydromet 5-1.5 Mg/31ml Syrp (Hydrocodone-Homatropine) .[name].. 1-2 Tsp Every 4-6 Hr As Needed Cough  Current Medications (verified): 1)  Allegra 180 Mg  Tabs (Fexofenadine Hcl) .... Take 1 Tablet By Mouth Once A Day 2)  Advair Diskus 250-50 Mcg/dose  Misc (Fluticasone-Salmeterol) .... Inhale 1 Puff Two Times A Day 3)  Singulair 10 Mg  Tabs (Montelukast Sodium) .... Take 1 Tab By Mouth Once Daily.Marland KitchenMarland Kitchen 4)  Proventil Hfa 108 (90  Base) Mcg/act  Aers (Albuterol Sulfate) .... Inhale 2 Puffs Every 4-6 Hours As Needed For Wheezing... 5)  Cymbalta 60 Mg  Cpep (Duloxetine Hcl) .... Take 1 Tablet By Mouth Once A Day 6)  Nabumetone 750 Mg  Tabs (Nabumetone) .[name].. 1 Tab By Mouth Two Times A Day As Needed For Arthritis Pain... 7)  Vitamin D3 5000 Unit/ml Liqd (Cholecalciferol) .... Take As Directed By Rober Minion... 8)  Ibuprofen 200 Mg Tabs (Ibuprofen) .... Take 1 Tablet By Mouth Every 6 Hours As Needed  Allergies (verified): 1)  ! Codeine  Past History:  Past Medical History: Last updated: 12/26/2008 SINUSITIS, ACUTE (ICD-461.9) PHYSICAL EXAMINATION  (ICD-V70.0) ALLERGIC RHINITIS (ICD-477.9) ASTHMA (ICD-493.90) DYSLIPIDEMIA (ICD-272.4) GERD (ICD-530.81) DEGENERATIVE DISC DISEASE (ICD-722.6) FIBROMYALGIA (ICD-729.1) VITAMIN D DEFICIENCY (ICD-268.9) STRESS DISORDER (ICD-V62.89)  Past Surgical History: Last updated: 12/26/2008 S/P ventral hernia repair by DrThompson 8/08  Family History: Last updated: 27-May-2009 Father, estranged- died age 70 w/ Alzheimer's disease. Mother alive age 10 w/ DM, CHF... 2 Siblings: Brother died age 77 after assault Sister age 51 in good health several half-siblings as well... Grandmother-passed 72 w/ MI Lung cancer in grandmother  Social History: Last updated: 12/26/2008 Married, husb= [place], 73yrs. 2 Children from prev marriage & one daugh on crack She is raising 2 grandkids... Ex-smoker, quit 2006 Social alcohol Caffeine daily  Risk Factors: Smoking Status: quit (04/14/2007)  Family History: Father, estranged- died age 55 w/ Alzheimer's disease. Mother alive age 53 w/ DM, CHF... 2 Siblings: Brother died age 23 after assault Sister age 84 in good health several half-siblings as well... Grandmother-passed 72 w/ MI Lung cancer in grandmother  Review of Systems      See HPI  Vital Signs:  Patient profile:   50 year old female Height:      64 inches Weight:      225 pounds BMI:     38.76 O2 Sat:      98 % on Room air Temp:     97.7 degrees F oral Pulse rate:   96 / minute BP sitting:   140 / 98  (left arm)  Vitals Entered By: [name], [name] (April 30, 1608 9:19 AM)  O2 Flow:  Room air CC: Acute Visit pt clo shortness of breath , fatigue, onset of chest pain x 3 over last 2 weeks lasting about 15 minutes and nausea denies left arm pain Is Patient Diabetic? No Comments Medications reviewed with patient Daytime contact number verified with patient. [name]  May 27, 2009 9:28 AM    Physical Exam  Additional Exam:  WD, Overweight, 50 y/o WF in  NAD... GENERAL:  Alert & oriented; pleasant & cooperative... HEENT:  [name],   EACs-clear, TMs-wnl, NOSE-red mucosa, max tenderness, THROAT-clear & wnl. NECK:  Supple w/ full ROM; no JVD; normal carotid impulses w/o bruits; no thyromegaly or nodules palpated; no lymphadenopathy. CHEST:  Coarse BS bilaterally HEART:  Regular Rhythm; without murmurs/ rubs/ or gallops. ABDOMEN:  Soft & nontender; normal bowel sounds; no organomegaly or masses detected. EXT: without deformities or arthritic changes; no varicose veins/ +venous insuffic/ tr edema.     Impression & Recommendations:  Problem # 1:  CHEST PAIN (ICD-786.50)  Atypical Chest pain , EKG without acute changes. Suspect is secondary to GERD However she has several risk factors w/ family hx- will refer to cardiology for evaluation and possible further  testing to r/o underlying card. dz.  REC:  GERD diet Begin Dexilant 60mg  once daily,  finish samples-then begin Omeprazole 20mg  once daily before meal We are referring you to Cardiology to evaluate chest pain.  I will call with labs Low fat cholestrol diet, decrease portions Increase activity as tolerated after evaluation with cardiology.  follow up 4 weeks in [place] [facility]  Please contact office for sooner follow up if symptoms do not improve or worsen   Orders: Cardiology Referral (Cardiology) TLB-BMP (Basic Metabolic Panel-BMET) (80048-METABOL) TLB-CBC Platelet - w/Differential (85025-CBCD) TLB-TSH (Thyroid Stimulating Hormone) (84443-TSH) TLB-A1C / Hgb A1C (Glycohemoglobin) (83036-A1C) Est. Patient Level IV (40981)  Medications Added to Medication List This Visit: 1)  Omeprazole 20 Mg Cpdr (Omeprazole) .[name].. 1 by mouth once daily  Complete Medication List: 1)  Allegra 180 Mg Tabs (Fexofenadine hcl) .... Take 1 tablet by mouth once a day 2)  Advair Diskus 250-50 Mcg/dose Misc (Fluticasone-salmeterol) .... Inhale 1 puff two times a day 3)  Singulair 10 Mg Tabs (Montelukast  sodium) .... Take 1 tab by mouth once daily.Marland KitchenMarland Kitchen 4)  Proventil Hfa 108 (90 Base) Mcg/act Aers (Albuterol sulfate) .... Inhale 2 puffs every 4-6 hours as needed for wheezing... 5)  Cymbalta 60 Mg Cpep (Duloxetine hcl) .... Take 1 tablet by mouth once a day 6)  Nabumetone 750 Mg Tabs (Nabumetone) .[name].. 1 tab by mouth two times a day as needed for arthritis pain.Marland KitchenMarland Kitchen 7)  Vitamin D3 5000 Unit/ml Liqd (Cholecalciferol) .... Take as directed by drdeveshwar... 8)  Ibuprofen 200 Mg Tabs (Ibuprofen) .... Take 1 tablet by mouth every 6 hours as needed 9)  Omeprazole 20 Mg Cpdr (Omeprazole) .[name].. 1 by mouth once daily  Patient Instructions: 1)  GERD diet 2)  Begin Dexilant 60mg  once daily, finish samples-then begin Omeprazole 20mg  once daily before meal 3)  We are referring you to Cardiology to evaluate chest pain.  4)  I will call with labs 5)  Low fat cholestrol diet, decrease portions 6)  Increase activity as tolerated after evaluation with cardiology.  7)  follow up 4 weeks in [place] [facility]  8)  Please contact office for sooner follow up if symptoms do not improve or worsen  Prescriptions: OMEPRAZOLE 20 MG CPDR (OMEPRAZOLE) 1 by mouth once daily  #30 x 5   Entered and Authorized by:   Rubye Oaks NP   Signed by:   Konrad Hoak NP on 04/30/2009   Method used:   Electronically to        [facility]  [address] [id number] (retail)       [address]       [place], [place]  [address]       Ph: [phone] or [phone]       Fax: [phone]   RxID:   [id number]    Card[employer] ECG  ID: [id number] Patient: Jaime Chapman, Jaime Chapman DOB: 08/13/1960 Age: 50 Years Old Sex: Female Race: White Physician: Rubye Oaks PCP: Alroy Dust Technician: [name] Height: 64 Weight: 225 Status: Unconfirmed Past Medical History:  SINUSITIS, ACUTE (ICD-461.9) PHYSICAL EXAMINATION (ICD-V70.0) ALLERGIC RHINITIS (ICD-477.9) ASTHMA (ICD-493.90) DYSLIPIDEMIA (ICD-272.4) GERD  (ICD-530.81) DEGENERATIVE DISC DISEASE (ICD-722.6) FIBROMYALGIA (ICD-729.1) VITAMIN D DEFICIENCY (ICD-268.9) STRESS DISORDER (ICD-V62.89)   Recorded: 04/30/2009 09:27 AM P/PR: 95 ms / 148 ms - Heart rate (maximum exercise) QRS: 80 QT/QTc/QTd: 385 ms / 418 ms / 40 ms - Heart rate (maximum exercise)  P/QRS/T axis: 51 deg / 33 deg / 75 deg - Heart rate (maximum exercise)  Heartrate: 79 bpm  Interpretation:  sinus rhythm   Normal ECG

## 2010-03-31 NOTE — Progress Notes (Signed)
Summary: arm bruised and swollen  Phone Note Call from Patient Call back at [phone]   Caller: Patient Call For: nadel Summary of Call: pt have swelling and bruise on rt arm. she have not injured it  Initial call taken by: Rickard Patience,  August 25, 2009 10:01 AM  Follow-up for Phone Call        Spoke with pt.  Pt states on Thursday morning she noticed right arm pit was swollen and sore.  This progressed to pains going down arm, forearm becoming hard/firm, and arm hurting even when not touching it.  States on Friday a bruise appeared on arm.  Now bruising has spread up the arm and hand starting to hurt.  States she has not bumped into anything or injured it any way.  Requesting SN's recs.  Will forward message to SN-pls advise.  Thanks! Follow-up by: Gweneth Dimitri RN,  August 25, 2009 10:21 AM  Additional Follow-up for Phone Call Additional follow up Details #1::        called and spoke with pt---per SN---she will need to be seen for this---no openings with SN---TP has appt s in the am---or she can go to [facility] for this---pt stated that she is scared to wait so she will go on to an [facility] to be seen for this. [name] CMA  August 25, 2009 11:09 AM

## 2010-03-31 NOTE — Assessment & Plan Note (Signed)
Summary: cough/jd   Primary Provider/Referring Provider:  Dr. Murray Hodgkins  CC:  hernia repair on 7-18  spent x 5 days in the hosp--started with cough while in hosp but worse since being home with cream color mucus---denies fever/fatigue.  History of Present Illness: 50 year old female patient has a known history of asthmatic bronchitis.      April 03, 2009 --Presents for an acute office visit. Pt c/o losing voice last 1 week ago.w/  productive cough with green mucus, nasal drainage clear to light green, S.O.B. Pt states has been in contact with numerous family members with virus. Pt was seen @ [facility] 03/30/09  and was told she has a virus. No tx given. REC for OTC meds. .Cough is getting worse, cant sleep.    April 30, 2009--Presents for an acute office visit. Complains of shortness of breath , fatigue, onset of chest pain x 3 over last 2 weeks lasting about 15 minutes and nausea denies left arm pain. She is under alot of stress w/ work and raising 2 grandchildren-5 yr old/20 months . Last visit w/ asthmatic bronchitis tx w/ omnicef and prednisone taper. She is improving but still has lingering dry cough. Does have intermittent heatburn w/ burning into throat. Lots of family sick w/ virus. Episode occurred yesterday prior to lunch, felt weak,  mid sternal chest pain-gripping pain,  lasted 15 min. took zantac which did helped. Feels tired alot. and thirsty.   June 05, 2009--Presents for 1 month follow up. She is feeling better. chest pain resolved. /w prilosec.  Last visit w/ multiple somatic complaints. She atypical chest pain sent to cards w/ neg stress echo for ischemia, did show mod diastolic dysfunction. EF 60-65%. Recommended to start fish oil. She was tx for GERD w/ Prilosec daily .  Labs were unremarkable and neg for DM . Is concerned that she has sleep apnea, she snores alot, tired during daytime, feels like she could nap after lunch.    September 26, 2009--Presents for an acute office  visit. Complains of cough for 2 weeks.  She underwent  underwent an uncomplicated laparoscopic  repair of this recurrent ventral hernia with mesh. on 09/15/09 -Dr Janee Morn. [name]  Postoperatively, she had some exacerbations  of her asthma as well as a mild ileus.  Developed cough during hospital stay. Now still coughing , green mucus started 2 days. Abd is still very sore. Denies chest pain, orthopnea, hemoptysis, fever, n/v/d, edema, headache  Preventive Screening-Counseling & Management  Alcohol-Tobacco     Smoking Status: quit     Year Started: 1978     Year Quit: 2006     Pack years: 31yrs, less than 1ppd  Comments: QUIT FOR YEARS AT A TIME SO THIS IS NOT A CONT OF SMOKING PER PT  Allergies: 1)  ! Codeine  Comments:  Nurse/Medical Assistant: The patient's medications and allergies were reviewed with the patient and were updated in the Medication and Allergy Lists.  Past History:  Past Medical History: Last updated: 09/08/2009 1. hx of SINUSITIS,  (ICD-461.9) 2. ALLERGIC RHINITIS (ICD-477.9) 3. ASTHMA (ICD-493.90) 4. DYSLIPIDEMIA (ICD-272.4) 5. GERD (ICD-530.81) 6. DEGENERATIVE DISC DISEASE (ICD-722.6) 7. FIBROMYALGIA (ICD-729.1) 8. VITAMIN D DEFICIENCY (ICD-268.9) 9. STRESS DISORDER (ICD-V62.89) 10.  Obesity 11.  Stress echo (3/11): No evidence for ischemia or infarction.  EF 60-65%, moderate diastolic dysfunction, no significant valvular disease, normal RV.  12. ?OSA-obese, snoring>>sleep study 06/26/09>>sm. no. of obstructive events, does not meet AHI criteria, she had loud snoring,  recommended on aggressive weight loss.   Past Surgical History: Last updated: 09/08/2009 S/P ventral hernia repair by DrThompson 8/08 Laparoscopy 2002 Endometrial Ablation 2009  Review of Systems      See HPI  Vital Signs:  Patient profile:   50 year old female Height:      64 inches Weight:      218.13 pounds BMI:     37.58 O2 Sat:      98 % on Room air Temp:     99.7 degrees F  oral Pulse rate:   105 / minute BP sitting:   124 / 90  (right arm) Cuff size:   regular  Vitals Entered By: [name] CMA (September 26, 2009 3:50 PM)  O2 Sat at Rest %:  98 O2 Flow:  Room air CC: hernia repair on 7-18  spent x 5 days in the hosp--started with cough while in hosp but worse since being home with cream color mucus---denies fever/fatigue Is Patient Diabetic? No Pain Assessment Patient in pain? yes      Onset of pain  some pain from the hernia surgery on july 18 Comments meds and allergies updated with pt today daytime phone number updated today--cell number [phone]   Physical Exam  Additional Exam:  WD, Overweight, 50 y/o WF in NAD... GENERAL:  Alert & oriented; pleasant & cooperative... HEENT:  [place]/AT,   EACs-clear, TMs-wnl, NOSE clear , THROAT-clear & wnl. NECK:  Supple w/ full ROM; no JVD; normal carotid impulses w/o bruits; no thyromegaly or nodules palpated; no lymphadenopathy. CHEST:  CTA bilaterally HEART:  Regular Rhythm; without murmurs/ rubs/ or gallops. ABDOMEN:  binder in place w/ incision sites -no redness well approximated.  EXT: without deformities or arthritic changes; no varicose veins/ +venous insuffic/ tr edema.     Impression & Recommendations:  Problem # 1:  ASTHMA (ICD-493.90)  Exacerbation  REC:  Hold fish oil Avelox 400mg  once daily for 7 days Mucinex DM two times a day as needed cough/congestion  May use hydrocodone for pain and/or cough.  Increase fluids.   I will call with xray results.  Please contact office for sooner follow up if symptoms do not improve or worsen   Orders: Est. Patient Level IV (45409) T-2 View CXR (71020TC)  Medications Added to Medication List This Visit: 1)  Hydrocodone-acetaminophen 5-325 Mg Tabs (Hydrocodone-acetaminophen) .[name].. 1 tablet by mouth every 4 hours as needed for pain 2)  Robaxin 500 Mg Tabs (Methocarbamol) .[name].. 1 by mouth every 6 hours as needed 3)  Avelox 400 Mg Tabs (Moxifloxacin hcl)  .[name].. 1 by mouth once daily  Complete Medication List: 1)  Allegra 180 Mg Tabs (Fexofenadine hcl) .... Take 1 tablet by mouth once a day 2)  Advair Diskus 100-50 Mcg/dose Aepb (Fluticasone-salmeterol) .[name].. 1 inhalation two times a day... 3)  Singulair 10 Mg Tabs (Montelukast sodium) .... Take 1 tab by mouth once daily.Marland KitchenMarland Kitchen 4)  Proventil Hfa 108 (90 Base) Mcg/act Aers (Albuterol sulfate) .... Inhale 2 puffs every 4-6 hours as needed for wheezing... 5)  Fish Oil 1000 Mg Caps (Omega-3 fatty acids) .... 2 daily 6)  Omeprazole 20 Mg Cpdr (Omeprazole) .[name].. 1 by mouth once daily 7)  Cymbalta 60 Mg Cpep (Duloxetine hcl) .... Take 1 tablet by mouth once a day 8)  Nabumetone 750 Mg Tabs (Nabumetone) .[name].. 1 tab by mouth two times a day as needed for arthritis pain.Marland KitchenMarland Kitchen 9)  Hydrocodone-acetaminophen 5-325 Mg Tabs (Hydrocodone-acetaminophen) .[name].. 1 tablet by mouth every 4 hours  as needed for pain 10)  Robaxin 500 Mg Tabs (Methocarbamol) .[name].. 1 by mouth every 6 hours as needed 11)  Avelox 400 Mg Tabs (Moxifloxacin hcl) .[name].. 1 by mouth once daily  Patient Instructions: 1)  Hold fish oil 2)  Avelox 400mg  once daily for 7 days 3)  Mucinex DM two times a day as needed cough/congestion  4)  May use hydrocodone for pain and/or cough.  5)  Increase fluids.  6)   I will call with xray results.  7)  Please contact office for sooner follow up if symptoms do not improve or worsen  Prescriptions: AVELOX 400 MG TABS (MOXIFLOXACIN HCL) 1 by mouth once daily  #7 x 0   Entered and Authorized by:   Rubye Oaks NP   Signed by:   Tammy Parrett NP on 09/26/2009   Method used:   Electronically to        [facility]  [address] [id number] (retail)       [address]       [place], [place]  [address]       Ph: [phone] or [phone]       Fax: [phone]   RxID:   [id number]

## 2010-04-02 NOTE — Letter (Signed)
Summary: [facility]   Imported By: Sherian Rein 03/05/2010 07:17:22  _____________________________________________________________________  External Attachment:    Type:   Image     Comment:   External Document

## 2010-04-02 NOTE — Progress Notes (Signed)
Summary: new referral  Phone Note Call from Patient Call back at Home Phone [phone] Call back at [phone]   Caller: Patient Call For: nadel Reason for Call: Talk to Nurse Summary of Call: Patient asking if Dr. Kriste Basque would refer her to a different surgeon for hernia reapair. Initial call taken by: Lehman Prom,  February 26, 2010 12:52 PM  Follow-up for Phone Call        called and spoke with pt---she stated that Dr. Janee Morn did the initial hernia repair and she has had nothing but problems after that---sob, the hernia returned---she is requesting to be refered to another surgeon not in that practice.  please advise. thanks [name] CMA  February 26, 2010 2:43 PM   Additional Follow-up for Phone Call Additional follow up Details #1::        per SN---the general surgeons in [place] are all in one practice--- SN recs to see Dr. Daphine Deutscher   otherwise she will have to go to [facility] or [place]----called and spoke with pt and she is ok to see Dr. Daphine Deutscher and order for this has been put in  [name] [facility]  February 26, 2010 3:56 PM

## 2010-04-16 NOTE — Letter (Signed)
Summary: [facility]  [facility]   Imported By: Sherian Rein 04/06/2010 10:17:46  _____________________________________________________________________  External Attachment:    Type:   Image     Comment:   External Document

## 2010-04-22 ENCOUNTER — Encounter: Payer: BC Managed Care – PPO | Attending: Rheumatology | Admitting: *Deleted

## 2010-04-22 DIAGNOSIS — Z713 Dietary counseling and surveillance: Secondary | ICD-10-CM | POA: Insufficient documentation

## 2010-04-22 DIAGNOSIS — Z724 Inappropriate diet and eating habits: Secondary | ICD-10-CM | POA: Insufficient documentation

## 2010-05-17 LAB — CBC
Hemoglobin: 14.6 g/dL (ref 12.0–15.0)
MCH: 29.3 pg (ref 26.0–34.0)
MCHC: 34.3 g/dL (ref 30.0–36.0)
Platelets: 227 10*3/uL (ref 150–400)
RDW: 13 % (ref 11.5–15.5)

## 2010-05-17 LAB — SURGICAL PCR SCREEN: Staphylococcus aureus: NEGATIVE

## 2010-07-14 NOTE — Op Note (Signed)
Jaime Chapman, Jaime Chapman              ACCOUNT NO.:  [id number]   MEDICAL RECORD NO.:  [id number]          PATIENT TYPE:  AMB   LOCATION:  DSC                          FACILITY:  [facility]   PHYSICIAN:  Gabrielle Dare. Janee Morn, M.D.DATE OF BIRTH:  Oct 29, 1960   DATE OF PROCEDURE:  10/26/2006  DATE OF DISCHARGE:                               OPERATIVE REPORT   PREOPERATIVE DIAGNOSIS:  Ventral abdominal hernia.   POSTOPERATIVE DIAGNOSIS:  Ventral abdominal hernia.   PROCEDURE:  Repair of ventral abdominal hernia.   SURGEON:  Gabrielle Dare. Janee Morn, M.D.   ANESTHESIA:  General.   HISTORY OF PRESENT ILLNESS:  Jaime Chapman is a 50 year old female who I  evaluated in the office for symptomatic ventral abdominal hernia that is  approximately 3 cm above her umbilicus.  She presents today for elective  repair.   PROCEDURE IN DETAIL:  Informed consent was obtained. The patient  received intravenous antibiotics.  Her site was marked.  She was brought  to the operating room.  General anesthesia was administered by the  anesthesia staff.  Her abdomen was prepped and draped in a sterile  fashion.  0.25%  Marcaine with epinephrine was injected subcutaneously  over the hernia site.  A small midline incision was made.  Subcutaneous  tissues were dissected down revealing the hernia contents. This was  omental fat.  It was circumferentially dissected free from the  surrounding fascia and easily reduced back into the abdomen maintaining  excellent hemostasis.  The fascia was cleared off circumferentially.  The defect in the fascia was actually only about 1.5 cm in size, but  there was a rather large amount of omentum herniating through.  Once  everything was nicely reduced, the hernia was repaired with interrupted  #1 Novofil sutures. The area was copiously irrigated.  Meticulous  hemostasis was ensured.  Some additional local anesthetic was injected  in the musculature and subcutaneous tissues.  The wound was then  closed  in layers with the subcutaneous tissues approximated with interrupted 2-  0 Vicryl sutures and the skin was closed with a running 4-0 Monocryl  subcuticular stitch.  Sponge, needle and instrument counts were correct.  Benzoin, Steri-Strips and sterile dressings were applied.  The patient  tolerated the procedure well without apparent complication and was taken  to the recovery room in stable condition.      Gabrielle Dare Janee Morn, M.D.  Electronically Signed     BET/MEDQ  D:  10/26/2006  T:  10/26/2006  Job:  [id number]   cc:   Lonzo Cloud. Kriste Basque, MD

## 2010-07-17 NOTE — H&P (Signed)
[name]  Patient:    Jaime Chapman, Jaime Chapman                     MRN: [id number] Adm. Date:  [id number] Attending:  Minette Headland                         History and Physical  PREOPERATIVE DIAGNOSIS:           Chronic pelvic pain, menorrhagia.  POSTOPERATIVE DIAGNOSES:          Chronic pelvic pain, menorrhagia, with slight  boggy enlargement of uterus possibly consistent with adenomyosis.  OPERATION:                        Dilatation and curettage followed by diagnostic laparoscopy and uterosacral nerve ablation with bipolar coagulation of uterosacral ligaments.  SURGEON:                          Freddy Finner, M.D.  ANESTHESIA:                       General.  ESTIMATED INTRAOPERATIVE BLOOD LOSS:  10 cc.  INTRAOPERATIVE COMPLICATIONS:     None.  IMPRESSION:  The patient is a 50 year old, gravida 5, para 2, who has a long history of pelvic pain and has had recurring ovarian cysts.  She has previously had surgical sterilization.  She had a pelvic ultrasound in the office which was essentially normal except for perhaps slight enlargement of the uterus but no nodules and no definite abnormalities.  There were no adnexal masses.  She is admitted now for [facility] laparoscopy.  INTRAOPERATIVE FINDINGS:  Basically as described in the postoperative diagnoses. The uterus itself was perhaps 1-1/2 times normal but symmetrical.  There were no definite nodules.  It was boggy to palpation with a blunt probe.  There was no evidence of pelvic adhesions, no peritoneal disease, specifically no evidence of endometriosis.  The appendix could not be visualized and was presumed to be retrocecal.  There was no apparent abnormality in the upper abdomen.  DESCRIPTION OF PROCEDURE:  The patient was admitted on the morning of surgery, brought to the operating room, placed under adequate general anesthesia, placed in the dorsolithotomy position using Allen stirrup  system.  Betadine prep of abdomen, perineum, and vagina was carried out.  A wide-mouth speculum was introduced.  The cervix was grasped with a single-tooth tenaculum.  The cervix was progressively dilated to 25 with Pratts.  Gentle, thorough curettage was carried out, the uterus sounded to approximately 9.5 to 10 cm.  Exploration with polyp forceps was then carried out.  Tissue was submitted for histologic examination.  Hulka tenaculum was attached to the cervix.  The Bladder was evacuated with a Robinson catheter.  Sterile drapes were applied.  Two small incisions were made, one at the umbilicus, one just above the symphysis.  A 10 mm trocar was introduced through the upper incision while elevating the anterior abdominal wall manually. Direct inspection revealed adequate placement with no evidence of injury on entry. Pneumoperitoneum was allowed to accumulate using carbon dioxide gas.  A 5 mm trocar was placed through the lower incision and a blunt probe used during the procedure through the trocar sleeve.  The patient was placed in steep Trendelenburg position.  careful systematic examination of pelvic and abdominal contents was carried  out. There were appropriate segments of tube missing on each side consistent with previous tubal ligation.  The uterosacrals were put on stretch by elevating the  uterus with a Hulka tenaculum, and they were fulgurated at the junction with the cervix using bipolar forceps.  The procedure was terminated at this point.  Gas was allowed to escape.  All instruments were removed. Skin incisions were closed with interrupted subcuticular sutures of 3-0 Dexon.  Steri-Strips were applied to the lower incision.  The patient was taken to the recovery room in good condition and will be discharged in the postop period with Darvocet for pain and routine postoperative instructions. DD:  05/02/99 TD:  05/03/99 Job: [id number]

## 2010-07-17 NOTE — Assessment & Plan Note (Signed)
[facility]                             PULMONARY OFFICE NOTE   NAME:Jaime Chapman, Jaime Chapman                     MRN:          [id number]  DATE:03/15/2006                            DOB:          June 18, 1960    This is a 50 year old white female patient of Dr. Jodelle Green who has an  known history of asthmatic bronchitis, presents for an acute office  visit.  The patient complains of a 5 day history of productive cough,  thick green sputum, nasal congestion, hoarseness, and wheezing.  The  patient has had to use her albuterol several times a day over the last  few days.  The patient denies any hemoptysis, chest pain, orthopnea,  PND, recent travel, or antibiotic use.   PAST MEDICAL HISTORY:  Reviewed.   CURRENT MEDICATIONS:  Reviewed.   PHYSICAL EXAMINATION:  The patient is a pleasant female in no acute  distress.  She is afebrile with stable vital signs.  O2 saturation is  98% on room air.  HEENT:  Unremarkable.  NECK:  Supple without cervical adenopathy, no JVD.  LUNGS:  Sounds reveal coarse breath sounds bilaterally without any  wheezing noted.  CARDIAC:  A regular rate and rhythm.  ABDOMEN:  Soft and benign.  EXTREMITIES:  Warm without any edema.   IMPRESSION/PLAN:  Acute exacerbation of asthmatic bronchitis.  The  patient is to begin Omnicef times 5 days.  Mucinex DM twice daily.  Prednisone taper over the next week.  Endal HD #8 ounces 1-2 teaspoons  every 4-6 hours as needed for cough.  The patient is aware of sedating  effect.  The patient was given the Xopenex nebulizer treatment in the  office.  The patient is to return back with Dr. Kriste Basque as scheduled or  sooner if needed.      Rubye Oaks, NP  Electronically Signed      Lonzo Cloud. Kriste Basque, MD  Electronically Signed   TP/MedQ  DD: 03/15/2006  DT: 03/15/2006  Job #: [id number]

## 2010-07-23 ENCOUNTER — Telehealth: Payer: Self-pay | Admitting: Pulmonary Disease

## 2010-07-23 DIAGNOSIS — M549 Dorsalgia, unspecified: Secondary | ICD-10-CM

## 2010-07-23 NOTE — Telephone Encounter (Signed)
I spoke with the patient and she states on May 3rd she fell in the bathtub. She states her arm was bruised and she was sore but didn't think anything was hurt, but about 4 days later she began to have low back pain and states it is very painful to walk or move at all. She went to a chiropractor yesterday and he took some xrays and told her it looked like she had a fracture in her lower back. He told her to contact her PCP for further xrays because his machine was very old and the images were not the best so he did not want to refer her to an ortho doc without being sure she had a fracture. Pt was not sure how to proceed. Please advise. Carron Curie, CMA

## 2010-07-23 NOTE — Telephone Encounter (Signed)
Per SN----SN feels that it would be better to get xrays done with ortho docs--so these dont have to be repeated--needs appt asap with ortho--fall with back pain.  thanks

## 2010-07-23 NOTE — Telephone Encounter (Signed)
Spoke with pt and notified of recs per SN.  She verbalized understanding. Order for ortho referral was sent.

## 2010-07-25 ENCOUNTER — Inpatient Hospital Stay (INDEPENDENT_AMBULATORY_CARE_PROVIDER_SITE_OTHER)
Admission: RE | Admit: 2010-07-25 | Discharge: 2010-07-25 | Disposition: A | Discharge status: Home / Self Care | Payer: BC Managed Care – PPO | Source: Ambulatory Visit | Attending: Family Medicine | Admitting: Family Medicine

## 2010-07-25 DIAGNOSIS — J069 Acute upper respiratory infection, unspecified: Secondary | ICD-10-CM

## 2010-08-31 ENCOUNTER — Other Ambulatory Visit: Payer: Self-pay | Admitting: Orthopedic Surgery

## 2010-08-31 DIAGNOSIS — M545 Low back pain, unspecified: Secondary | ICD-10-CM

## 2010-09-09 ENCOUNTER — Other Ambulatory Visit: Payer: Self-pay | Admitting: Pulmonary Disease

## 2010-10-13 ENCOUNTER — Telehealth: Payer: Self-pay | Admitting: Pulmonary Disease

## 2010-10-13 DIAGNOSIS — Z0000 Encounter for general adult medical examination without abnormal findings: Secondary | ICD-10-CM

## 2010-10-13 NOTE — Telephone Encounter (Signed)
Pt has a physical scheduled next week on Aug 22 with SN.  She would like to come in prior to this for labs.  Pls advise.  Thanks!

## 2010-10-13 NOTE — Telephone Encounter (Signed)
Per TP: okay for pt to come in for CBCD, BMET, LIPID, TSH, urinalysis.  Called spoke with patient who will come in Friday morning for labs.  Pt is aware to be fasting.  Orders placed in epic.

## 2010-10-16 ENCOUNTER — Other Ambulatory Visit (INDEPENDENT_AMBULATORY_CARE_PROVIDER_SITE_OTHER): Payer: BC Managed Care – PPO | Admitting: Pulmonary Disease

## 2010-10-16 ENCOUNTER — Other Ambulatory Visit (INDEPENDENT_AMBULATORY_CARE_PROVIDER_SITE_OTHER): Payer: BC Managed Care – PPO

## 2010-10-16 DIAGNOSIS — Z0000 Encounter for general adult medical examination without abnormal findings: Secondary | ICD-10-CM

## 2010-10-16 LAB — BASIC METABOLIC PANEL
BUN: 16 mg/dL (ref 6–23)
CO2: 23 mEq/L (ref 19–32)
Calcium: 9.3 mg/dL (ref 8.4–10.5)
Chloride: 111 mEq/L (ref 96–112)
Creatinine, Ser: 0.9 mg/dL (ref 0.4–1.2)

## 2010-10-16 LAB — URINALYSIS
Bilirubin Urine: NEGATIVE
Hgb urine dipstick: NEGATIVE
Leukocytes, UA: NEGATIVE
Nitrite: NEGATIVE
Total Protein, Urine: NEGATIVE
pH: 5.5 (ref 5.0–8.0)

## 2010-10-16 LAB — CBC WITH DIFFERENTIAL/PLATELET
Basophils Relative: 0.3 % (ref 0.0–3.0)
Eosinophils Absolute: 0.2 10*3/uL (ref 0.0–0.7)
HCT: 39.5 % (ref 36.0–46.0)
Hemoglobin: 13.3 g/dL (ref 12.0–15.0)
Lymphocytes Relative: 23.5 % (ref 12.0–46.0)
Lymphs Abs: 1.5 10*3/uL (ref 0.7–4.0)
MCHC: 33.7 g/dL (ref 30.0–36.0)
Monocytes Relative: 6.9 % (ref 3.0–12.0)
Neutro Abs: 4.1 10*3/uL (ref 1.4–7.7)
RBC: 4.52 Mil/uL (ref 3.87–5.11)
RDW: 13.4 % (ref 11.5–14.6)

## 2010-10-16 LAB — LIPID PANEL
Cholesterol: 182 mg/dL (ref 0–200)
LDL Cholesterol: 114 mg/dL — ABNORMAL HIGH (ref 0–99)

## 2010-10-16 LAB — TSH: TSH: 2.15 u[IU]/mL (ref 0.35–5.50)

## 2010-10-21 ENCOUNTER — Ambulatory Visit (INDEPENDENT_AMBULATORY_CARE_PROVIDER_SITE_OTHER): Payer: BC Managed Care – PPO | Admitting: Pulmonary Disease

## 2010-10-21 ENCOUNTER — Ambulatory Visit (INDEPENDENT_AMBULATORY_CARE_PROVIDER_SITE_OTHER)
Admission: RE | Admit: 2010-10-21 | Discharge: 2010-10-21 | Disposition: A | Discharge status: Home / Self Care | Payer: BC Managed Care – PPO | Source: Ambulatory Visit | Attending: Pulmonary Disease | Admitting: Pulmonary Disease

## 2010-10-21 ENCOUNTER — Encounter: Payer: Self-pay | Admitting: Pulmonary Disease

## 2010-10-21 DIAGNOSIS — K219 Gastro-esophageal reflux disease without esophagitis: Secondary | ICD-10-CM

## 2010-10-21 DIAGNOSIS — E785 Hyperlipidemia, unspecified: Secondary | ICD-10-CM

## 2010-10-21 DIAGNOSIS — R0602 Shortness of breath: Secondary | ICD-10-CM

## 2010-10-21 DIAGNOSIS — K439 Ventral hernia without obstruction or gangrene: Secondary | ICD-10-CM

## 2010-10-21 DIAGNOSIS — E669 Obesity, unspecified: Secondary | ICD-10-CM

## 2010-10-21 DIAGNOSIS — J45909 Unspecified asthma, uncomplicated: Secondary | ICD-10-CM

## 2010-10-21 DIAGNOSIS — Z0000 Encounter for general adult medical examination without abnormal findings: Secondary | ICD-10-CM

## 2010-10-21 DIAGNOSIS — IMO0002 Reserved for concepts with insufficient information to code with codable children: Secondary | ICD-10-CM

## 2010-10-21 DIAGNOSIS — F411 Generalized anxiety disorder: Secondary | ICD-10-CM | POA: Insufficient documentation

## 2010-10-21 DIAGNOSIS — IMO0001 Reserved for inherently not codable concepts without codable children: Secondary | ICD-10-CM

## 2010-10-21 DIAGNOSIS — F410 Panic disorder [episodic paroxysmal anxiety] without agoraphobia: Secondary | ICD-10-CM

## 2010-10-21 MED ORDER — MONTELUKAST SODIUM 10 MG PO TABS: 10.0000 mg | ORAL_TABLET | Freq: Every day | ORAL | Status: DC

## 2010-10-21 MED ORDER — FLUTICASONE-SALMETEROL 100-50 MCG/DOSE IN AEPB: 1.0000 | INHALATION_SPRAY | Freq: Two times a day (BID) | RESPIRATORY_TRACT | Status: DC

## 2010-10-21 MED ORDER — OMEPRAZOLE 20 MG PO CPDR: 20.0000 mg | DELAYED_RELEASE_CAPSULE | Freq: Every day | ORAL | Status: DC

## 2010-10-21 MED ORDER — ALBUTEROL SULFATE HFA 108 (90 BASE) MCG/ACT IN AERS: 2.0000 | INHALATION_SPRAY | RESPIRATORY_TRACT | Status: DC | PRN

## 2010-10-21 MED ORDER — ALPRAZOLAM 0.5 MG PO TABS: ORAL_TABLET | ORAL | Status: DC

## 2010-10-21 NOTE — Patient Instructions (Signed)
Today we updated your med list in EPIC...    We refilled your meds, and we wrote a new prescription for ALPRAZOLAM 0.5mg  to take 1/2 to 1 tab up to 3 times daily as needed for the panic attacks & anxiety...  You recent labs looks good>    Today we did your Pulm function test & CXR...    Please call the PHONE TREE in a few days for your results...    Dial [id number] & when prompted enter your patient number followed by the # symbol...    Your patient number is:   [id number]#  We will arrange for a GI evaluation by one of our Gastroenterologists as the next step in the work up of your abd symptoms...  Call for any questions.Marland KitchenMarland Kitchen

## 2010-10-22 ENCOUNTER — Encounter: Payer: Self-pay | Admitting: Gastroenterology

## 2010-10-22 ENCOUNTER — Encounter: Payer: Self-pay | Admitting: Pulmonary Disease

## 2010-10-22 NOTE — Progress Notes (Signed)
Subjective:    Patient ID: Jaime Chapman, female    DOB: December 21, 1960, 50 y.o.   MRN: [id number]  HPI 50 y/o WF here for a follow up visit... she has several medical issues as noted below... Followed for general medical purposes w/ hx of Asthma (ex-smoker quit 2006), Dyslipidiemia, GERD, DJD/ DDD/ FM, and Anxiety... she is also followed by DrDeveshwar for Rheum, DrMcLean for Cards, & DrThompson for [facility]...  ~  December 26, 2008:  when last seen in Mar09 she was sent to [facility] for FM & placed on Cymbalta... she still follows up w/ DrD & ?last seen 2/10- for the FM & Lumbago on Cymbalta 60, Flexeril 10mg  Prn, and Relafen Prn...  also found to have a low Vit d level- treated w/ 50000 u weekly for awhile, then changed to 5000 u twice daily (Vit D levels & Rx per DrDeveshwar per pt)...  she has not had any asthma exac; unfortunately her weight is up 221# & we discussed diet/ exercise...  ~  September 08, 2009:  she saw DrThompson at [facility] for persistant ventral hernia complaints & is sched for re=do w/ mesh next week... she had a Sleep Study 4/11 for hypersomnia- AHI= 3, loud snoring, & rec for weight reduction... she also had cards eval DrMcLean for CP w/ norm EKG> felt to be atyp GERD & improved w/ Omep;  she had a StressEcho which was neg- no ischemia, normal EF, mod DD noted...  ~  October 21, 2010:  37mo ROV & she notes "I've had a bad yr" starting w/ her laparoscopic abd wall hernia repair w/ mesh 7/11 by DrThompson; post op pain & difficulty w/ recurrence seen on CTAbd, 2nd opinion DrMMartin & she was disappointed "he wanted me to have lap-band surg"; she hasn't been evaluated by GI yet & I suggested that she see GI now & consider seeking the opinion of DrHeneford in [place] if more surg is contemplated; she has prob w/ constip (firm, hard to [pass BM ~qod, no blood seen)...    She notes increased SOB "ever since the closed MRI was attempted"; she feels that pressure from her abd is pushing up on her  diaphragm & makes her SOB; plus she gets panic attacks & hypervent episodes w/ paresthesias;  We checked CXR> clear, NAD (epicard fat pad at right cardiophrenic angle); and PFTs> c/w mod restriction + small airways dis...    She also notes a fall 5/12 in bathroom w/ subseq LBP, bruising, didn't go to ER but saw her chiropractor several days later w/ ?fx of a lumbar vert==> sent to DrDuda, no fx seen, tried to get MRI (had panic attack), finally sent to [facility] for OPEN MRI which she reports showed spinal arthritis & he rec shots but she declined;  She went to Rheum, Rober Minion, for review & she rec relafen, TENS, therapy, etc (Improved); 7/12 Rheum note reviewed...   Current Problems:   ALLERGIC RHINITIS (ICD-477.9) - on ALLEGRA 180/d- states she needs it daily!  ASTHMA (ICD-493.90) - on ADVAIR 100Bid, SINGULAIR 10mg /d, PROVENTIL HFA as needed, & MUCINEX 1-2 Bid... she had an asthma flair in Feb09 due to exposure w/ grandson in daycare rx'd w/ Levaquin and Pred and improved...   CHEST PAIN (ICD-786.50) - Cardiac eval 3/11 by DrMcLean for atyp CP> symptoms improved w/ PPI Rx. ~  EKG showed NSR, WNL... ~  2DECho showed normal LVF w/ EF= 60-65%, norm wall motion, gr 2 DD, valves normal. ~  Stress Echo was  neg- no ischemia...  DYSLIPIDEMIA (ICD-272.4) - on diet + FISH OIL but unfortunately her weight is up to 230# ~  FLP 10/07 showed TChol 181, TG 117, HDL 35, LDL 123... rec for better diet & exercise program. ~  FLP 3/09 (wt=207#) showed TChol 201, TG 204, HDL 41, LDL 128... she prefers diet Rx. ~  FLP 10/10 (wt=221#) showed TChol 217, TG 225, HDL 39, LDL 150... offered med Rx. ~  FLP 8/12 (wt=230#) showed TChol 182, TG 146, HDL 39, LDL 114  OBESITY (ZOX-096.04) - she weighs ~230# and is 64" tall for a BMI= 40... we discussed diet & exercise program. ~  7/11:  weight remains 222# & she blames the Cymbalta for her lack of wt reduction. ~  8/12:  Weight = 230# & she is off the Cymbalta...  GERD  (ICD-530.81) - on OMEPRAZOLE 20mg /d w/ improvement... no prev eval and symptoms are minimal at present... we discussed the assoc of reflux to asthma...  DEGENERATIVE DISC DISEASE (ICD-722.6) - she is followed by DrDeveshwar for Rheum> notes reviewed: FM, fall w/ LBP, MRI w/ facet arthritis, CSpine disc dis w/ chronic pain;  On RELAFEN 750mg  Bid as needed... Also tried Lidoderm, PT, TENS, heat etc... ~  5/12:  She reports a fall in bathroon w/ bruising in her side> saw Chiropractor, then DrDuda, then [employer]....  FIBROMYALGIA (ICD-729.1) - DrNeal has given her Ambien to help w/ her sleep pattern; She tried Cymbalta but blamed wt gain on this med; she has been referred to Integrative therapies in the past... Currently taking RELAFEN 750mg  bid prn...  VITAMIN D DEFICIENCY (ICD-268.9) - Vit D level here 3/09 was 13 and started on 50K weekly... pt indicates that DrDeveshwar has been following her Vit D level since then and switched her to 5000 u two times a day... ~  8/12:  There is no Vit D supplement listed on her med sheet...  STRESS DISORDER & PANIC ATTACKS - she blames the closed MRI w/ the recurrence of her Panic attacks> Rx w/ ALPRAZOLAM 0.5mg  Tid as directed...  HEALTH MAINT --- GYN= DrNeal & he does mammograms in his office & BMD as well...   Past Surgical History  Procedure Date  . Ventral hernia repair 09/2006    Dr. Janee Morn  . Laparoscopy 2002  . Endometrial ablation 2009  . Laparoscopic ventral hernia repair w/ mesh 08/2009    Dr. Janee Morn, [facility]    Outpatient Encounter Prescriptions as of 10/21/2010  Medication Sig Dispense Refill  . albuterol (PROVENTIL HFA) 108 (90 BASE) MCG/ACT inhaler Inhale 2 puffs into the lungs every 4 (four) hours as needed for wheezing.  6.7 g  11  . Black Cohosh 160 MG CAPS Take 3 capsules by mouth daily.        . fexofenadine (ALLEGRA) 180 MG tablet Take 180 mg by mouth daily.        . Fluticasone-Salmeterol (ADVAIR) 100-50 MCG/DOSE AEPB Inhale 1  puff into the lungs every 12 (twelve) hours.  60 each  11  . montelukast (SINGULAIR) 10 MG tablet Take 1 tablet (10 mg total) by mouth at bedtime.  30 tablet  11  . nabumetone (RELAFEN) 750 MG tablet Take 750 mg by mouth 2 (two) times daily. As needed for arthritis pain       . omeprazole (PRILOSEC) 20 MG capsule Take 1 capsule (20 mg total) by mouth daily.  30 capsule  11  . ALPRAZolam (XANAX) 0.5 MG tablet Take 1/2 to 1 tablet  by mouth three times daily as needed for panic attacks  90 tablet  5  . DISCONTD: DULoxetine (CYMBALTA) 60 MG capsule Take 60 mg by mouth daily.        [name] DISCONTD: fish oil-omega-3 fatty acids 1000 MG capsule Take 2 g by mouth daily.          Allergies  Allergen Reactions  . Codeine     REACTION: nausea,vomiting,dizziness  . Cymbalta (Duloxetine Hcl)     Pt states it caused weight gain    Current Medications, Allergies, Past Medical History, Past Surgical History, Family History, and Social History were reviewed in [employer] record.    Review of Systems         See HPI - all other systems neg except as noted...  The patient denies anorexia, fever, weight loss, weight gain, vision loss, decreased hearing, hoarseness, chest pain, syncope, dyspnea on exertion, peripheral edema, prolonged cough, headaches, hemoptysis, abdominal pain, melena, hematochezia, severe indigestion/heartburn, hematuria, incontinence, muscle weakness, suspicious skin lesions, transient blindness, difficulty walking, depression, unusual weight change, abnormal bleeding, enlarged lymph nodes, and angioedema.     Objective:   Physical Exam    WD, Overweight, 50 y/o WF in NAD... GENERAL:  Alert & oriented; pleasant & cooperative... HEENT:  [place]/AT, EOM-wnl, PERRLA, EACs-clear, TMs-wnl, NOSE-clear, THROAT-clear & wnl. NECK:  Supple w/ full ROM; no JVD; normal carotid impulses w/o bruits; no thyromegaly or nodules palpated; no lymphadenopathy. CHEST:  Clear to P & A;  without wheezes/ rales/ or rhonchi. HEART:  Regular Rhythm; without murmurs/ rubs/ or gallops. ABDOMEN:  Obese, soft & mild tender; normal bowel sounds; no organomegaly or masses detected. EXT: without deformities or arthritic changes; no varicose veins/ +venous insuffic/ tr edema. NEURO:  CN's intact;  no focal neuro deficits;  + trigger points on exam... DERM:  No lesions noted; no rash etc...  CXR:   Clear, NAD, +epicardial fat pad at right cardiophrenic angle...  PFT's:   FVC= 2.14 (64%), FEV1= 1.65 (60%), %1sec= 77, Mid-flows= 48%pred; ==> prob mod restriction & superimposed small airways disease...   Assessment & Plan:   ABDOMINAL symptoms>  S/p surg for recurrent abd wall hernias & they are present again; in addition she has mult GI complaints; before she considers the need for any additional surg I feel she should be evaluated by her gastroenterologist & she should consider a consultation from DrHeneford in [place]...  ASTHMA>  Stable on her combination of Advair, Proventil, Singulair; we discussed her restrictive lung disease & it's relation to chest wall muscle spasm & panic attacks...  We have prescribed Xanax and discussed using it regularly at first...  DYSPNEA/ PANIC ATTACKS>  As above- we have initiated Alprazolam Tid to start & she if she can relax sufficiently...  DYSLIPIDEMIA>  Her FLP is actually improved on her current diet despite wt gain etc;  Imagine what she could do if she lost the weight...  Elevated Fasting blood sugar>  FBS= 127 & she is admonished to get on diet (low card, no sweets) & get her weight down...  Obesity>  As above diet + exercise are the keys!!!  GERD>  On Prilosec OTC daily...  DJD, CSpine DDD, Chr Pain, Spinal arthritis>  Followed by DrDeveshwar on RELAFEN 750mg  Bid, TENS, Lidoderm as needed...  Vit D deficiency>  Pt states Vit D levels checked by DrDeveshwar who has her on OTC Vit d supplement daily...  ANXIETY/ Panic disorder>  She will  start ALPRAZOLAM 0.5mg   Tid as needed.Marland KitchenMarland Kitchen

## 2010-10-28 ENCOUNTER — Encounter: Payer: Self-pay | Admitting: Gastroenterology

## 2010-10-28 ENCOUNTER — Ambulatory Visit (INDEPENDENT_AMBULATORY_CARE_PROVIDER_SITE_OTHER): Payer: BC Managed Care – PPO | Admitting: Gastroenterology

## 2010-10-28 VITALS — BP 134/76 | HR 88 | Ht 64.0 in | Wt 228.0 lb

## 2010-10-28 DIAGNOSIS — K760 Fatty (change of) liver, not elsewhere classified: Secondary | ICD-10-CM | POA: Insufficient documentation

## 2010-10-28 DIAGNOSIS — K5900 Constipation, unspecified: Secondary | ICD-10-CM

## 2010-10-28 DIAGNOSIS — R109 Unspecified abdominal pain: Secondary | ICD-10-CM

## 2010-10-28 DIAGNOSIS — K7689 Other specified diseases of liver: Secondary | ICD-10-CM

## 2010-10-28 DIAGNOSIS — K219 Gastro-esophageal reflux disease without esophagitis: Secondary | ICD-10-CM

## 2010-10-28 MED ORDER — NA SULFATE-K SULFATE-MG SULF 17.5-3.13-1.6 GM/177ML PO SOLN: 1.0000 | ORAL | Status: DC

## 2010-10-28 MED ORDER — OMEPRAZOLE 20 MG PO CPDR: 20.0000 mg | DELAYED_RELEASE_CAPSULE | Freq: Two times a day (BID) | ORAL | Status: DC

## 2010-10-28 NOTE — Patient Instructions (Signed)
You have been scheduled for a Upper Endoscopy/ Colonoscopy. See separate instructions. Your prep kit and your prescription for omeprazole has been sent to the pharmacy.  Start Miralax 17 grams in 8 oz of water 1-2 x daily as needed for constipation. Patient advised to avoid spicy, acidic, citrus, chocolate, mints, fruit and fruit juices.  Limit the intake of caffeine, alcohol and Soda.  Don't exercise too soon after eating.  Don't lie down within 3-4 hours of eating.  Elevate the head of your bed. High fiber diet given. cc: Alroy Dust, MD

## 2010-10-28 NOTE — Progress Notes (Signed)
History of Present Illness: This is a 50 year old female who has had problems with a recurrent ventral hernia. She relates problems with swelling, bulging and pain around her hernia repair incision. She initially underwent repair in 2009 and had a redo repair in 2011 with mesh. Her symptoms returned and an abdominal CT scan in December 2011 showed a persistent ventral hernia and fatty liver. She was initially evaluated by Dr. Janee Morn and Dr. Daphine Deutscher. They recommended weight loss prior to attempting another surgical repair. She has problems with reflux symptoms that are not well-controlled on daily omeprazole. She states she has breakthrough symptoms 3-4 times each week. She also has mild constipation with a bowel movement every 2-3 days. She occasionally has gas and bloating. Denies weight loss, abdominal pain, diarrhea, change in stool caliber, melena, hematochezia, nausea, vomiting, dysphagia. She was receives started on Xanax for panic attacks and chest pain. Past Medical History  Diagnosis Date  . Sinusitis   . Allergic rhinitis   . Asthma   . Dyslipidemia   . GERD (gastroesophageal reflux disease)   . DDD (degenerative disc disease)   . Fibromyalgia   . Vitamin D deficiency   . Stress disorder, acute   . Obesity   . OSA (obstructive sleep apnea)   . Anxiety    Past Surgical History  Procedure Date  . Ventral hernia repair 09/2006    Dr. Janee Morn  . Laparoscopy 2002  . Endometrial ablation 2009  . Laparoscopic ventral hernia repair w/ mesh 08/2009    Dr. Janee Morn, [facility]  . Cesarean section 1982, 1985    x 2   . Tubal ligation     reports that she quit smoking about 6 years ago. She has never used smokeless tobacco. She reports that she drinks alcohol. She reports that she does not use illicit drugs. family history includes Diabetes in her mother; Heart disease in her mother; and Lung cancer in her maternal grandmother.  There is no history of Colon cancer. Allergies  Allergen Reactions   . Codeine     REACTION: nausea,vomiting,dizziness  . Cymbalta (Duloxetine Hcl)     Pt states it caused weight gain   Outpatient Encounter Prescriptions as of 10/28/2010  Medication Sig Dispense Refill  . albuterol (PROVENTIL HFA) 108 (90 BASE) MCG/ACT inhaler Inhale 2 puffs into the lungs every 4 (four) hours as needed for wheezing.  6.7 g  11  . ALPRAZolam (XANAX) 0.5 MG tablet Take 1/2 to 1 tablet by mouth three times daily as needed for panic attacks  90 tablet  5  . Black Cohosh 160 MG CAPS Take 3 capsules by mouth daily.        . fexofenadine (ALLEGRA) 180 MG tablet Take 180 mg by mouth daily.        . Fluticasone-Salmeterol (ADVAIR) 100-50 MCG/DOSE AEPB Inhale 1 puff into the lungs every 12 (twelve) hours.  60 each  11  . montelukast (SINGULAIR) 10 MG tablet Take 1 tablet (10 mg total) by mouth at bedtime.  30 tablet  11  . nabumetone (RELAFEN) 750 MG tablet Take 750 mg by mouth 2 (two) times daily. As needed for arthritis pain       . omeprazole (PRILOSEC) 20 MG capsule Take 1 capsule (20 mg total) by mouth 2 (two) times daily.  60 capsule  11  . DISCONTD: omeprazole (PRILOSEC) 20 MG capsule Take 1 capsule (20 mg total) by mouth daily.  30 capsule  11  . Na Sulfate-K Sulfate-Mg Sulf (SUPREP  BOWEL PREP) SOLN Take 1 kit by mouth as directed.  177 mL  0    Review of Systems: Pertinent positive and negative review of systems were noted in the above HPI section. All other review of systems were otherwise negative.  Physical Exam: General: Well developed , well nourished, no acute distress Head: Normocephalic and atraumatic Eyes:  sclerae anicteric, EOMI Ears: Normal auditory acuity Mouth: No deformity or lesions Neck: Supple, no masses or thyromegaly Lungs: Clear throughout to auscultation Heart: Regular rate and rhythm; no murmurs, rubs or bruits Abdomen: Soft, non tender and non distended. Midline incision just above her umbilicus with a bulging around the incision consistent with a  ventral hernia. No masses, hepatosplenomegaly or hernias noted. Normal Bowel sounds Rectal: Deferred to colonoscopy Musculoskeletal: Symmetrical with no gross deformities  Skin: No lesions on visible extremities Pulses:  Normal pulses noted Extremities: No clubbing, cyanosis, edema or deformities noted Neurological: Alert oriented x 4, grossly nonfocal Cervical Nodes:  No significant cervical adenopathy Inguinal Nodes: No significant inguinal adenopathy Psychological:  Alert and cooperative. Normal mood and affect  Assessment and Recommendations:  1. Recurrent ventral hernia associated with pain status post 2 repairs, the second with mesh. Patient states Dr. Kriste Basque is arranging another surgical opinion for management options.  2. Mild chronic constipation and abdominal pain. Her abdominal pain is most likely related to her ventral hernia. Rule out colorectal lesions. The risks, benefits, and alternatives to colonoscopy with possible biopsy and possible polypectomy were discussed with the patient and they consent to proceed. Increase dietary fiber and water intake. Avoid straining with bowel movements. Begin MiraLax titrated for adequate bowel movements.  3. GERD. Standard antireflux measures and increase omeprazole to 20 mg twice daily. Rule out ulcer disease and esophagitis. The risks, benefits, and alternatives to endoscopy with possible biopsy and possible dilation were discussed with the patient and they consent to proceed.   4. Fatty liver. Long-term weight loss program supervised by her primary physician.

## 2010-11-20 ENCOUNTER — Encounter: Payer: Self-pay | Admitting: Gastroenterology

## 2010-11-20 ENCOUNTER — Ambulatory Visit (AMBULATORY_SURGERY_CENTER): Payer: BC Managed Care – PPO | Admitting: Gastroenterology

## 2010-11-20 DIAGNOSIS — K2970 Gastritis, unspecified, without bleeding: Secondary | ICD-10-CM

## 2010-11-20 DIAGNOSIS — D126 Benign neoplasm of colon, unspecified: Secondary | ICD-10-CM

## 2010-11-20 DIAGNOSIS — R109 Unspecified abdominal pain: Secondary | ICD-10-CM

## 2010-11-20 DIAGNOSIS — K2940 Chronic atrophic gastritis without bleeding: Secondary | ICD-10-CM

## 2010-11-20 DIAGNOSIS — K219 Gastro-esophageal reflux disease without esophagitis: Secondary | ICD-10-CM

## 2010-11-20 DIAGNOSIS — K2990 Gastroduodenitis, unspecified, without bleeding: Secondary | ICD-10-CM

## 2010-11-20 MED ORDER — SODIUM CHLORIDE 0.9 % IV SOLN: 500.0000 mL | INTRAVENOUS | Status: DC

## 2010-11-20 NOTE — Patient Instructions (Signed)
Follow your discharge instructions.  Continue your medications.  Await pathology results. 

## 2010-11-23 ENCOUNTER — Telehealth: Payer: Self-pay | Admitting: *Deleted

## 2010-11-23 NOTE — Telephone Encounter (Signed)
Message left for the patient on answering machine.

## 2010-11-25 ENCOUNTER — Encounter: Payer: Self-pay | Admitting: Gastroenterology

## 2010-12-28 ENCOUNTER — Encounter: Payer: Self-pay | Admitting: Adult Health

## 2010-12-28 ENCOUNTER — Ambulatory Visit (INDEPENDENT_AMBULATORY_CARE_PROVIDER_SITE_OTHER): Payer: BC Managed Care – PPO | Admitting: Adult Health

## 2010-12-28 VITALS — BP 160/96 | HR 106 | Temp 97.8°F | Ht 64.0 in | Wt 229.8 lb

## 2010-12-28 DIAGNOSIS — J45909 Unspecified asthma, uncomplicated: Secondary | ICD-10-CM

## 2010-12-28 MED ORDER — PREDNISONE 10 MG PO TABS: ORAL_TABLET | ORAL | Status: AC

## 2010-12-28 MED ORDER — ALBUTEROL SULFATE (2.5 MG/3ML) 0.083% IN NEBU
2.5000 mg | INHALATION_SOLUTION | Freq: Once | RESPIRATORY_TRACT | Status: AC
  Administered 2010-12-28: 2.5 mg via RESPIRATORY_TRACT

## 2010-12-28 MED ORDER — AMOXICILLIN-POT CLAVULANATE 875-125 MG PO TABS: 1.0000 | ORAL_TABLET | Freq: Two times a day (BID) | ORAL | Status: AC

## 2010-12-28 NOTE — Patient Instructions (Signed)
Augmentin 875mg  Twice daily  For 7 days w/ food.  Mucinex DM Twice daily  As needed  Cough/congestion  Fluids and rest  Saline nasal rinses As needed   Prednisone taper over next week.  Please contact office for sooner follow up if symptoms do not improve or worsen or seek emergency care

## 2010-12-28 NOTE — Progress Notes (Signed)
Subjective:    Patient ID: Jaime Chapman, female    DOB: 1960-06-13, 50 y.o.   MRN: [id number]  HPI  50 y/o WF  Followed for general medical purposes w/ hx of Asthma (ex-smoker quit 2006), Dyslipidiemia, GERD, DJD/ DDD/ FM, and Anxiety... she is also followed by DrDeveshwar for Rheum, DrMcLean for Cards, & DrThompson for [facility]...  ~  December 26, 2008:  when last seen in Mar09 she was sent to [facility] for FM & placed on Cymbalta... she still follows up w/ DrD & ?last seen 2/10- for the FM & Lumbago on Cymbalta 60, Flexeril 10mg  Prn, and Relafen Prn...  also found to have a low Vit d level- treated w/ 50000 u weekly for awhile, then changed to 5000 u twice daily (Vit D levels & Rx per DrDeveshwar per pt)...  she has not had any asthma exac; unfortunately her weight is up 221# & we discussed diet/ exercise...  ~  September 08, 2009:  she saw DrThompson at [facility] for persistant ventral hernia complaints & is sched for re=do w/ mesh next week... she had a Sleep Study 4/11 for hypersomnia- AHI= 3, loud snoring, & rec for weight reduction... she also had cards eval DrMcLean for CP w/ norm EKG> felt to be atyp GERD & improved w/ Omep;  she had a StressEcho which was neg- no ischemia, normal EF, mod DD noted...  ~  October 21, 2010:  50 y.o. ROV & she notes "I've had a bad yr" starting w/ her laparoscopic abd wall hernia repair w/ mesh 7/11 by DrThompson; post op pain & difficulty w/ recurrence seen on CTAbd, 2nd opinion DrMMartin & she was disappointed "he wanted me to have lap-band surg"; she hasn't been evaluated by GI yet & I suggested that she see GI now & consider seeking the opinion of DrHeneford in [place] if more surg is contemplated; she has prob w/ constip (firm, hard to [pass BM ~qod, no blood seen)...    She notes increased SOB "ever since the closed MRI was attempted"; she feels that pressure from her abd is pushing up on her diaphragm & makes her SOB; plus she gets panic attacks & hypervent episodes w/  paresthesias;  We checked CXR> clear, NAD (epicard fat pad at right cardiophrenic angle); and PFTs> c/w mod restriction + small airways dis...    She also notes a fall 5/12 in bathroom w/ subseq LBP, bruising, didn't go to ER but saw her chiropractor several days later w/ ?fx of a lumbar vert==> sent to DrDuda, no fx seen, tried to get MRI (had panic attack), finally sent to [facility] for OPEN MRI which she reports showed spinal arthritis & he rec shots but she declined;  She went to Rheum, Rober Minion, for review & she rec relafen, TENS, therapy, etc (Improved); 7/12 Rheum note reviewed...  12/28/2010 Acute OV  Pt complains of 1 week of cough, congestion, drainage, watery eyes with lots of cough. Now starting to cough up thick green mucus. Wheezing started yesterday. Has been using Allegra and saline nasal rinses without much help. Has a lot of drainage in throat. No fever, chest pain, or edema. No recent travel or antibiotic use.   PMH:   ALLERGIC RHINITIS (ICD-477.9) - on ALLEGRA 180/d- states she needs it daily!  ASTHMA (ICD-493.90) - on ADVAIR 100Bid, SINGULAIR 10mg /d, PROVENTIL HFA as needed, & MUCINEX 1-2 Bid... she had an asthma flair in Feb09 due to exposure w/ grandson in daycare rx'd w/ Levaquin and Pred and  improved...   CHEST PAIN (ICD-786.50) - Cardiac eval 3/11 by DrMcLean for atyp CP> symptoms improved w/ PPI Rx. ~  EKG showed NSR, WNL... ~  2DECho showed normal LVF w/ EF= 60-65%, norm wall motion, gr 2 DD, valves normal. ~  Stress Echo was neg- no ischemia...  DYSLIPIDEMIA (ICD-272.4) - on diet + FISH OIL but unfortunately her weight is up to 230# ~  FLP 10/07 showed TChol 181, TG 117, HDL 35, LDL 123... rec for better diet & exercise program. ~  FLP 3/09 (wt=207#) showed TChol 201, TG 204, HDL 41, LDL 128... she prefers diet Rx. ~  FLP 10/10 (wt=221#) showed TChol 217, TG 225, HDL 39, LDL 150... offered med Rx. ~  FLP 8/12 (wt=230#) showed TChol 182, TG 146, HDL 39, LDL  114  OBESITY (ICD-278.00) - she weighs ~230# and is 64" tall for a BMI= 40... we discussed diet & exercise program. ~  7/11:  weight remains 222# & she blames the Cymbalta for her lack of wt reduction. ~  8/12:  Weight = 230# & she is off the Cymbalta...  GERD (ICD-530.81) - on OMEPRAZOLE 20mg /d w/ improvement... no prev eval and symptoms are minimal at present... we discussed the assoc of reflux to asthma...  DEGENERATIVE DISC DISEASE (ICD-722.6) - she is followed by DrDeveshwar for Rheum> notes reviewed: FM, fall w/ LBP, MRI w/ facet arthritis, CSpine disc dis w/ chronic pain;  On RELAFEN 750mg  Bid as needed... Also tried Lidoderm, PT, TENS, heat etc... ~  5/12:  She reports a fall in bathroon w/ bruising in her side> saw Chiropractor, then DrDuda, then [employer]....  FIBROMYALGIA (ICD-729.1) - DrNeal has given her Ambien to help w/ her sleep pattern; She tried Cymbalta but blamed wt gain on this med; she has been referred to Integrative therapies in the past... Currently taking RELAFEN 750mg  bid prn...  VITAMIN D DEFICIENCY (ICD-268.9) - Vit D level here 3/09 was 13 and started on 50K weekly... pt indicates that DrDeveshwar has been following her Vit D level since then and switched her to 5000 u two times a day... ~  8/12:  There is no Vit D supplement listed on her med sheet...  STRESS DISORDER & PANIC ATTACKS - she blames the closed MRI w/ the recurrence of her Panic attacks> Rx w/ ALPRAZOLAM 0.5mg  Tid as directed...  HEALTH MAINT --- GYN= DrNeal & he does mammograms in his office & BMD as well...   Past Surgical History  Procedure Date  . Ventral hernia repair 09/2006    Dr. Janee Morn  . Laparoscopy 2002  . Endometrial ablation 2009  . Laparoscopic ventral hernia repair w/ mesh 08/2009    Dr. Janee Morn, [facility]  . Cesarean section 1982, 1985    x 2   . Tubal ligation     Outpatient Encounter Prescriptions as of 10/21/2010  Medication Sig Dispense Refill  . albuterol (PROVENTIL HFA)  108 (90 BASE) MCG/ACT inhaler Inhale 2 puffs into the lungs every 4 (four) hours as needed for wheezing.  6.7 g  11  . Black Cohosh 160 MG CAPS Take 3 capsules by mouth daily.        . fexofenadine (ALLEGRA) 180 MG tablet Take 180 mg by mouth daily.        . Fluticasone-Salmeterol (ADVAIR) 100-50 MCG/DOSE AEPB Inhale 1 puff into the lungs every 12 (twelve) hours.  60 each  11  . montelukast (SINGULAIR) 10 MG tablet Take 1 tablet (10 mg total) by mouth at  bedtime.  30 tablet  11  . nabumetone (RELAFEN) 750 MG tablet Take 750 mg by mouth 2 (two) times daily. As needed for arthritis pain       . omeprazole (PRILOSEC) 20 MG capsule Take 1 capsule (20 mg total) by mouth daily.  30 capsule  11  . ALPRAZolam (XANAX) 0.5 MG tablet Take 1/2 to 1 tablet by mouth three times daily as needed for panic attacks  90 tablet  5  . DISCONTD: DULoxetine (CYMBALTA) 60 MG capsule Take 60 mg by mouth daily.        [name] DISCONTD: fish oil-omega-3 fatty acids 1000 MG capsule Take 2 g by mouth daily.          Allergies  Allergen Reactions  . Codeine     REACTION: nausea,vomiting,dizziness  . Cymbalta (Duloxetine Hcl)     Pt states it caused weight gain    Current Medications, Allergies, Past Medical History, Past Surgical History, Family History, and Social History were reviewed in [employer] record.    Review of Systems         Constitutional:   No  weight loss, night sweats,  Fevers, chills, fatigue, or  lassitude.  HEENT:   No headaches,  Difficulty swallowing,  Tooth/dental problems, or  Sore throat,                No sneezing, itching, ear ache,  ++nasal congestion, post nasal drip,   CV:  No chest pain,  Orthopnea, PND, swelling in lower extremities, anasarca, dizziness, palpitations, syncope.   GI  No heartburn, indigestion, abdominal pain, nausea, vomiting, diarrhea, change in bowel habits, loss of appetite, bloody stools.   Resp:   No coughing up of blood.  No chest wall  deformity  Skin: no rash or lesions.  GU: no dysuria, change in color of urine, no urgency or frequency.  No flank pain, no hematuria   MS:  No joint pain or swelling.  No decreased range of motion.  No back pain.  Psych:  No change in mood or affect. No depression or anxiety.  No memory loss.       Objective:   Physical Exam     WD, Overweight, 50 y/o WF in NAD... GENERAL:  Alert & oriented; pleasant & cooperative... HEENT:  English/AT, EOM-wnl, PERRLA, EACs-clear, TMs-wnl , NOSE-clear drainage  THROAT-clear & wnl. NECK:  Supple w/ full ROM; no JVD; normal carotid impulses w/o bruits; no thyromegaly or nodules palpated; no lymphadenopathy. CHEST:  Coarse BS  without wheezes/ rales/ or rhonchi. HEART:  Regular Rhythm; without murmurs/ rubs/ or gallops. ABDOMEN:  Obese, soft & mild tender; normal bowel sounds; no organomegaly or masses detected. EXT: without deformities or arthritic changes; no varicose veins/ +venous insuffic/ tr edema.   DERM:  No lesions noted; no rash etc...    Assessment & Plan:

## 2010-12-28 NOTE — Assessment & Plan Note (Addendum)
Asthmatic bronchitic exacerbation with rhinitis  xopenex neb in office    Plan:  Augmentin 875mg  Twice daily  For 7 days w/ food.  Mucinex DM Twice daily  As needed  Cough/congestion  Fluids and rest  Saline nasal rinses As needed   Prednisone taper over next week.  Please contact office for sooner follow up if symptoms do not improve or worsen or seek emergency care

## 2011-02-22 ENCOUNTER — Telehealth: Payer: Self-pay | Admitting: Pulmonary Disease

## 2011-02-22 NOTE — Telephone Encounter (Signed)
Per Leigh, can add to SN schedule on 03/04/11 @ 4pm.

## 2011-03-04 ENCOUNTER — Ambulatory Visit (INDEPENDENT_AMBULATORY_CARE_PROVIDER_SITE_OTHER): Payer: BC Managed Care – PPO | Admitting: Pulmonary Disease

## 2011-03-04 ENCOUNTER — Other Ambulatory Visit (INDEPENDENT_AMBULATORY_CARE_PROVIDER_SITE_OTHER): Payer: BC Managed Care – PPO

## 2011-03-04 ENCOUNTER — Encounter: Payer: Self-pay | Admitting: Pulmonary Disease

## 2011-03-04 DIAGNOSIS — R7303 Prediabetes: Secondary | ICD-10-CM | POA: Insufficient documentation

## 2011-03-04 DIAGNOSIS — K760 Fatty (change of) liver, not elsewhere classified: Secondary | ICD-10-CM

## 2011-03-04 DIAGNOSIS — E669 Obesity, unspecified: Secondary | ICD-10-CM

## 2011-03-04 DIAGNOSIS — IMO0001 Reserved for inherently not codable concepts without codable children: Secondary | ICD-10-CM

## 2011-03-04 DIAGNOSIS — IMO0002 Reserved for concepts with insufficient information to code with codable children: Secondary | ICD-10-CM

## 2011-03-04 DIAGNOSIS — J45909 Unspecified asthma, uncomplicated: Secondary | ICD-10-CM

## 2011-03-04 DIAGNOSIS — R7301 Impaired fasting glucose: Secondary | ICD-10-CM

## 2011-03-04 DIAGNOSIS — K219 Gastro-esophageal reflux disease without esophagitis: Secondary | ICD-10-CM

## 2011-03-04 DIAGNOSIS — F410 Panic disorder [episodic paroxysmal anxiety] without agoraphobia: Secondary | ICD-10-CM

## 2011-03-04 DIAGNOSIS — E785 Hyperlipidemia, unspecified: Secondary | ICD-10-CM

## 2011-03-04 NOTE — Patient Instructions (Signed)
Today we updated your med list in our EPIC system...    Continue your current medications the same...  We reviewed the data from [facility] including your CXR & Lung Scan...    The two tiny nodules are likely related to old infections, scar tissue but they do need follow up to be sure they are not enlarging...  Let's plan a follow up OV in 6 months for CXR & to discuss a repeat CT scan  Today we did your follow up blood work including an A1c...    Please call the [facility] in a few days for your results...    Dial [id number] & when prompted enter your patient number followed by the # symbol...    Your patient number is:  [id number]#  Call for any questions.Marland KitchenMarland Kitchen

## 2011-03-05 LAB — BASIC METABOLIC PANEL
CO2: 26 mEq/L (ref 19–32)
Calcium: 9 mg/dL (ref 8.4–10.5)
Chloride: 104 mEq/L (ref 96–112)
Creatinine, Ser: 0.9 mg/dL (ref 0.4–1.2)
Glucose, Bld: 114 mg/dL — ABNORMAL HIGH (ref 70–99)
Potassium: 4.2 mEq/L (ref 3.5–5.1)

## 2011-03-06 ENCOUNTER — Encounter: Payer: Self-pay | Admitting: Pulmonary Disease
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
## 2011-05-11 ENCOUNTER — Ambulatory Visit: Payer: BC Managed Care – PPO | Admitting: Adult Health

## 2011-07-05 ENCOUNTER — Telehealth: Payer: Self-pay | Admitting: Pulmonary Disease

## 2011-07-05 DIAGNOSIS — K469 Unspecified abdominal hernia without obstruction or gangrene: Secondary | ICD-10-CM

## 2011-07-05 NOTE — Telephone Encounter (Signed)
Called and spoke with pt and she is c/o hernia pain.  She stated that this pain is getting worse in the last 2-3 weeks. She stated that if she turns even a small amount that the pain shoots into her left side.  She stated that she did see Dr. Daphine Deutscher 03/2010 and is requesting a referral to him again.  SN please advise if ok to do referral again.  Thanks

## 2011-07-05 NOTE — Telephone Encounter (Signed)
Referral has been sent in to the [facility] for appt with Dr. Daphine Deutscher for the pt per the pts request.  Ok per TP for this.  Thanks  Pt is aware.

## 2011-07-30 ENCOUNTER — Encounter (INDEPENDENT_AMBULATORY_CARE_PROVIDER_SITE_OTHER): Payer: Self-pay | Admitting: Surgery

## 2011-07-30 ENCOUNTER — Ambulatory Visit (INDEPENDENT_AMBULATORY_CARE_PROVIDER_SITE_OTHER): Payer: BC Managed Care – PPO | Admitting: Surgery

## 2011-07-30 VITALS — BP 152/106 | HR 76 | Temp 98.1°F | Resp 16 | Ht 64.0 in | Wt 218.0 lb

## 2011-07-30 DIAGNOSIS — K449 Diaphragmatic hernia without obstruction or gangrene: Secondary | ICD-10-CM

## 2011-07-30 NOTE — Patient Instructions (Signed)
Consider bariatric surgery options before next visit

## 2011-07-30 NOTE — Progress Notes (Signed)
Jaime Chapman 51 y.o.  Body mass index is 37.42 kg/(m^2).  Patient Active Problem List  Diagnoses  . VITAMIN D DEFICIENCY  . DYSLIPIDEMIA  . OBESITY  . SINUSITIS, ACUTE  . ALLERGIC RHINITIS  . ASTHMA  . GERD  . VENTRAL HERNIA  . DEGENERATIVE DISC DISEASE  . FIBROMYALGIA  . SHORTNESS OF BREATH  . CHEST PAIN  . Physical exam, annual  . Panic anxiety syndrome  . Fatty liver disease, nonalcoholic  . Impaired fasting glucose    Allergies  Allergen Reactions  . Codeine     REACTION: nausea,vomiting,dizziness  . Cymbalta (Duloxetine Hcl)     Pt states it caused weight gain    Past Surgical History  Procedure Date  . Ventral hernia repair 09/2006    Dr. Janee Chapman  . Laparoscopy 2002  . Endometrial ablation 2009  . Laparoscopic ventral hernia repair w/ mesh 08/2009    Dr. Janee Chapman, [facility]  . Cesarean section 1982, 1985    x 2   . Tubal ligation    Jaime Mcalpine, MD, MD No diagnosis found.  Jaime Chapman returns today to discuss her twice recurrent ventral hernia last repaired by Dr. Janee Chapman with a piece of physio-mesh which was 15 x 20 cm. She developed a recurrent hernia and intermittently has pain and distress with this. We have discussed bariatric options with her and also needed to look at her esophageal gastric junction to determine if she has a significant hiatal hernia causing her reflux.  She is interested in possibly having bariatric Chapman because with a BMI of right at 39 she is miserable. Her fibromyalgia is worse. I suggested that we obtain an upper GI series now and that way we reconvene to discuss whether a lap band with hiatal hernia repair and had a ventral incisional hernia repair would be the best approach. I will obtain an upper GI series on her and then I'll see her back in the office after that for further discussion. Jaime B. Daphine Deutscher, MD, [facility] [phone] beeper [phone]  07/30/2011 10:30 AM

## 2011-08-09 ENCOUNTER — Ambulatory Visit
Admission: RE | Admit: 2011-08-09 | Discharge: 2011-08-09 | Disposition: A | Discharge status: Home / Self Care | Payer: BC Managed Care – PPO | Source: Ambulatory Visit | Attending: Surgery | Admitting: Surgery

## 2011-08-09 DIAGNOSIS — K449 Diaphragmatic hernia without obstruction or gangrene: Secondary | ICD-10-CM

## 2011-08-19 ENCOUNTER — Ambulatory Visit (INDEPENDENT_AMBULATORY_CARE_PROVIDER_SITE_OTHER): Payer: BC Managed Care – PPO | Admitting: Surgery

## 2011-08-19 ENCOUNTER — Encounter (INDEPENDENT_AMBULATORY_CARE_PROVIDER_SITE_OTHER): Payer: Self-pay | Admitting: Surgery

## 2011-08-19 VITALS — BP 134/88 | HR 70 | Temp 97.6°F | Resp 14 | Ht 64.0 in | Wt 217.0 lb

## 2011-08-19 DIAGNOSIS — E669 Obesity, unspecified: Secondary | ICD-10-CM

## 2011-08-19 NOTE — Progress Notes (Signed)
Chief Complaint:  GERD, hiatal hernia, and ventral hernia, and morbid obesity  History of Present Illness:  Jaime Chapman is an 51 y.o. female who comes in followup with a multiply recurrent ventral hernia repaired initially by Dr. Janee Morn, a history of GERD, and obesity. I think the best approach to this will be to do a lap band hiatal hernia repair and also redo her ventral hernia repair at the same sitting.I have discussed this with her in shorter the posterior on hiatal hernia repair She had an upper GI  That showed a small hiatal hernia but with free reflux. I will go ahead and treated began the workup together approved for laparoscopic adjustable gastric banding  Past Medical History  Diagnosis Date  . Sinusitis   . Allergic rhinitis   . Asthma   . Dyslipidemia   . GERD (gastroesophageal reflux disease)   . Fibromyalgia   . Vitamin d deficiency   . Stress disorder, acute   . Obesity   . OSA (obstructive sleep apnea)   . Anxiety   . DDD (degenerative disc disease)     also has spinal arthritis  . Diabetes mellitus     borderline    Past Surgical History  Procedure Date  . Ventral hernia repair 09/2006    Dr. Janee Morn  . Laparoscopy 2002  . Endometrial ablation 2009  . Laparoscopic ventral hernia repair w/ mesh 08/2009    Dr. Janee Morn, [facility]  . Cesarean section 1982, 1985    x 2   . Tubal ligation     Current Outpatient Prescriptions  Medication Sig Dispense Refill  . BIOTIN PO Take by mouth daily.      [name] MAGNESIUM PO Take by mouth daily.      [name] albuterol (PROVENTIL HFA) 108 (90 BASE) MCG/ACT inhaler Inhale 2 puffs into the lungs every 4 (four) hours as needed for wheezing.  6.7 g  11  . ALPRAZolam (XANAX) 0.5 MG tablet Take 1/2 to 1 tablet by mouth three times daily as needed for panic attacks  90 tablet  5  . Cholecalciferol (VITAMIN D3) 1000 UNITS CAPS Take by mouth.      . fexofenadine (ALLEGRA) 180 MG tablet Take 180 mg by mouth daily.        . montelukast  (SINGULAIR) 10 MG tablet Take 1 tablet (10 mg total) by mouth at bedtime.  30 tablet  11  . nabumetone (RELAFEN) 750 MG tablet Take 750 mg by mouth 2 (two) times daily. As needed for arthritis pain       . omeprazole (PRILOSEC) 20 MG capsule Take 1 capsule (20 mg total) by mouth 2 (two) times daily.  60 capsule  11   Codeine and Cymbalta Family History  Problem Relation Age of Onset  . Lung cancer Maternal Grandmother   . Cancer Maternal Grandmother     lung  . Colon cancer Neg Hx   . Diabetes Mother   . Heart disease Mother   . Arthritis Mother    Social History:   reports that she quit smoking about 7 years ago. She has never used smokeless tobacco. She reports that she drinks alcohol. She reports that she does not use illicit drugs.   REVIEW OF SYSTEMS - PERTINENT POSITIVES ONLY: . Noncontributory  Physical Exam:   Blood pressure 134/88, pulse 70, temperature 97.6 F (36.4 C), temperature source Temporal, resp. rate 14, height 5\' 4"  (1.626 m), weight 217 lb (98.431 kg). Body mass index is 37.25  kg/(m^2).  Gen:  WDWN white female NAD  Neurological: Alert and oriented to person, place, and time. Motor and sensory function is grossly intact  Head: Normocephalic and atraumatic.  Eyes: Conjunctivae are normal. Pupils are equal, round, and reactive to light. No scleral icterus.  Neck: Normal range of motion. Neck supple. No tracheal deviation or thyromegaly present.  Cardiovascular:  SR without murmurs or gallops.  No carotid bruits Respiratory: Effort normal.  No respiratory distress. No chest wall tenderness. Breath sounds normal.  No wheezes, rales or rhonchi.  Abdomen:  Tender in the area of her previous ventral hernia air with a weakness noted. Patient advised to wear an abdominal binder in support only of her bowel and abdomen but her back GU: Musculoskeletal: Normal range of motion. Extremities are nontender. No cyanosis, edema or clubbing noted Lymphadenopathy: No cervical,  preauricular, postauricular or axillary adenopathy is present Skin: Skin is warm and dry. No rash noted. No diaphoresis. No erythema. No pallor. Pscyh: Normal mood and affect. Behavior is normal. Judgment and thought content normal.   LABORATORY RESULTS: No results found for this or any previous visit (from the past 48 hour(s)).  RADIOLOGY RESULTS: No results found.  Problem List: Patient Active Problem List  Diagnosis  . VITAMIN D DEFICIENCY  . DYSLIPIDEMIA  . OBESITY  . SINUSITIS, ACUTE  . ALLERGIC RHINITIS  . ASTHMA  . GERD  . VENTRAL HERNIA  . DEGENERATIVE DISC DISEASE  . FIBROMYALGIA  . SHORTNESS OF BREATH  . CHEST PAIN  . Physical exam, annual  . Panic anxiety syndrome  . Fatty liver disease, nonalcoholic  . Impaired fasting glucose    Assessment & Plan: We'll begin workup for lapband with hiatal hernia repair to treat obesity and reflux. Also repair of ventral hernia the same time.    Matt B. Daphine Deutscher, MD, [facility] [phone] beeper [phone]  08/19/2011 10:20 AM

## 2011-08-19 NOTE — Patient Instructions (Addendum)
Thanks for your patience.  If you need further assistance after leaving the office, please call our office and speak with Tracy A.  [phone].  If you want to leave a message for Dr. Yenny Kosa, please call his office phone at [phone]. 

## 2011-08-24 ENCOUNTER — Other Ambulatory Visit (INDEPENDENT_AMBULATORY_CARE_PROVIDER_SITE_OTHER): Payer: Self-pay | Admitting: General Surgery

## 2011-08-24 DIAGNOSIS — Z9884 Bariatric surgery status: Secondary | ICD-10-CM

## 2011-08-25 ENCOUNTER — Telehealth (INDEPENDENT_AMBULATORY_CARE_PROVIDER_SITE_OTHER): Payer: Self-pay | Admitting: General Surgery

## 2011-08-25 NOTE — Telephone Encounter (Signed)
Pt calling after talking with sleep study center at [facility].  She has had a sleep study there previously, in April 2011, and was told they usually don't repeat them for 5 years.  Please advise

## 2011-10-19 ENCOUNTER — Ambulatory Visit (HOSPITAL_COMMUNITY): Admission: RE | Admit: 2011-10-19 | Payer: BC Managed Care – PPO | Source: Ambulatory Visit

## 2011-10-19 ENCOUNTER — Ambulatory Visit (HOSPITAL_COMMUNITY): Payer: BC Managed Care – PPO

## 2011-10-20 ENCOUNTER — Ambulatory Visit: Payer: BC Managed Care – PPO | Admitting: *Deleted

## 2011-10-21 ENCOUNTER — Encounter: Payer: Self-pay | Admitting: *Deleted

## 2011-11-05 ENCOUNTER — Encounter (HOSPITAL_COMMUNITY): Admission: RE | Payer: Self-pay | Source: Ambulatory Visit

## 2011-11-05 SURGERY — BREATH TEST, FOR HELICOBACTER PYLORI

## 2011-11-09 ENCOUNTER — Ambulatory Visit (HOSPITAL_COMMUNITY): Admission: RE | Admit: 2011-11-09 | Payer: BC Managed Care – PPO | Source: Ambulatory Visit | Admitting: Surgery

## 2011-11-17 ENCOUNTER — Other Ambulatory Visit: Payer: Self-pay | Admitting: Pulmonary Disease

## 2011-12-21 ENCOUNTER — Telehealth: Payer: Self-pay | Admitting: Pulmonary Disease

## 2011-12-21 ENCOUNTER — Other Ambulatory Visit: Payer: Self-pay | Admitting: Pulmonary Disease

## 2011-12-21 NOTE — Telephone Encounter (Signed)
Leigh sent in the refill already today [facility] for the pt to be notified

## 2012-01-13 ENCOUNTER — Encounter: Payer: Self-pay | Admitting: Adult Health

## 2012-01-13 ENCOUNTER — Other Ambulatory Visit: Payer: Self-pay | Admitting: Pulmonary Disease

## 2012-01-13 ENCOUNTER — Ambulatory Visit (INDEPENDENT_AMBULATORY_CARE_PROVIDER_SITE_OTHER): Payer: BC Managed Care – PPO | Admitting: Adult Health

## 2012-01-13 VITALS — BP 164/96 | HR 87 | Temp 98.4°F | Ht 64.0 in | Wt 187.4 lb

## 2012-01-13 DIAGNOSIS — Z0000 Encounter for general adult medical examination without abnormal findings: Secondary | ICD-10-CM

## 2012-01-13 DIAGNOSIS — J019 Acute sinusitis, unspecified: Secondary | ICD-10-CM

## 2012-01-13 DIAGNOSIS — J45909 Unspecified asthma, uncomplicated: Secondary | ICD-10-CM

## 2012-01-13 MED ORDER — PREDNISONE 10 MG PO TABS: ORAL_TABLET | ORAL | Status: DC

## 2012-01-13 MED ORDER — CEFDINIR 300 MG PO CAPS: 300.0000 mg | ORAL_CAPSULE | Freq: Two times a day (BID) | ORAL | Status: DC

## 2012-01-13 MED ORDER — LEVALBUTEROL HCL 0.63 MG/3ML IN NEBU
0.6300 mg | INHALATION_SOLUTION | Freq: Once | RESPIRATORY_TRACT | Status: AC
  Administered 2012-01-13: 0.63 mg via RESPIRATORY_TRACT
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
## 2012-01-13 NOTE — Assessment & Plan Note (Addendum)
Acute Sinusitis w/ asthma flare   Plan   Omnicef 300mg  Twice daily  For 10 days  Mucinex DM Twice daily  As needed  Cough/congestion  Saline nasal rinses As needed   Prednisone taper over next week.  Please contact office for sooner follow up if symptoms do not improve or worsen or seek emergency care  Follow up Dr. Kriste Basque  As planned and As needed

## 2012-01-13 NOTE — Patient Instructions (Addendum)
Omnicef 300mg  Twice daily  For 10 days  Mucinex DM Twice daily  As needed  Cough/congestion  Saline nasal rinses As needed   Prednisone taper over next week.  Please contact office for sooner follow up if symptoms do not improve or worsen or seek emergency care  Follow up Dr. Kriste Basque  As planned and As needed

## 2012-01-20 ENCOUNTER — Other Ambulatory Visit (INDEPENDENT_AMBULATORY_CARE_PROVIDER_SITE_OTHER): Payer: BC Managed Care – PPO

## 2012-01-20 DIAGNOSIS — Z0000 Encounter for general adult medical examination without abnormal findings: Secondary | ICD-10-CM

## 2012-01-20 LAB — BASIC METABOLIC PANEL
BUN: 20 mg/dL (ref 6–23)
CO2: 29 mEq/L (ref 19–32)
Chloride: 104 mEq/L (ref 96–112)
Creatinine, Ser: 0.9 mg/dL (ref 0.4–1.2)

## 2012-01-20 LAB — LIPID PANEL
Cholesterol: 198 mg/dL (ref 0–200)
Triglycerides: 106 mg/dL (ref 0.0–149.0)

## 2012-01-20 LAB — CBC WITH DIFFERENTIAL/PLATELET
Basophils Relative: 0.3 % (ref 0.0–3.0)
Eosinophils Absolute: 0 10*3/uL (ref 0.0–0.7)
Lymphocytes Relative: 15.5 % (ref 12.0–46.0)
MCHC: 33.5 g/dL (ref 30.0–36.0)
Neutrophils Relative %: 78.3 % — ABNORMAL HIGH (ref 43.0–77.0)
RBC: 5.03 Mil/uL (ref 3.87–5.11)
WBC: 11.8 10*3/uL — ABNORMAL HIGH (ref 4.5–10.5)

## 2012-01-20 LAB — URINALYSIS
Hgb urine dipstick: NEGATIVE
Leukocytes, UA: NEGATIVE
Nitrite: NEGATIVE
Total Protein, Urine: NEGATIVE
Urobilinogen, UA: 0.2 (ref 0.0–1.0)

## 2012-01-20 LAB — HEPATIC FUNCTION PANEL
ALT: 49 U/L — ABNORMAL HIGH (ref 0–35)
Bilirubin, Direct: 0.1 mg/dL (ref 0.0–0.3)
Total Protein: 7.7 g/dL (ref 6.0–8.3)

## 2012-01-25 ENCOUNTER — Encounter: Payer: Self-pay | Admitting: *Deleted

## 2012-01-26 ENCOUNTER — Ambulatory Visit (INDEPENDENT_AMBULATORY_CARE_PROVIDER_SITE_OTHER): Payer: BC Managed Care – PPO | Admitting: Pulmonary Disease

## 2012-01-26 ENCOUNTER — Encounter: Payer: Self-pay | Admitting: Pulmonary Disease

## 2012-01-26 VITALS — BP 142/82 | HR 74 | Temp 98.0°F | Ht 64.0 in | Wt 187.6 lb

## 2012-01-26 DIAGNOSIS — IMO0001 Reserved for inherently not codable concepts without codable children: Secondary | ICD-10-CM

## 2012-01-26 DIAGNOSIS — E663 Overweight: Secondary | ICD-10-CM

## 2012-01-26 DIAGNOSIS — K439 Ventral hernia without obstruction or gangrene: Secondary | ICD-10-CM

## 2012-01-26 DIAGNOSIS — K219 Gastro-esophageal reflux disease without esophagitis: Secondary | ICD-10-CM

## 2012-01-26 DIAGNOSIS — E785 Hyperlipidemia, unspecified: Secondary | ICD-10-CM

## 2012-01-26 DIAGNOSIS — Z23 Encounter for immunization: Secondary | ICD-10-CM

## 2012-01-26 DIAGNOSIS — Z0000 Encounter for general adult medical examination without abnormal findings: Secondary | ICD-10-CM

## 2012-01-26 DIAGNOSIS — K760 Fatty (change of) liver, not elsewhere classified: Secondary | ICD-10-CM

## 2012-01-26 DIAGNOSIS — R918 Other nonspecific abnormal finding of lung field: Secondary | ICD-10-CM

## 2012-01-26 DIAGNOSIS — IMO0002 Reserved for concepts with insufficient information to code with codable children: Secondary | ICD-10-CM

## 2012-01-26 DIAGNOSIS — K7689 Other specified diseases of liver: Secondary | ICD-10-CM

## 2012-01-26 DIAGNOSIS — J45909 Unspecified asthma, uncomplicated: Secondary | ICD-10-CM

## 2012-01-26 DIAGNOSIS — E559 Vitamin D deficiency, unspecified: Secondary | ICD-10-CM

## 2012-01-26 MED ORDER — FLUTICASONE-SALMETEROL 100-50 MCG/DOSE IN AEPB: 1.0000 | INHALATION_SPRAY | Freq: Two times a day (BID) | RESPIRATORY_TRACT | Status: DC

## 2012-01-26 MED ORDER — ALPRAZOLAM 0.5 MG PO TABS: ORAL_TABLET | ORAL | Status: DC

## 2012-01-26 MED ORDER — ALBUTEROL SULFATE HFA 108 (90 BASE) MCG/ACT IN AERS: INHALATION_SPRAY | RESPIRATORY_TRACT | Status: DC

## 2012-01-26 MED ORDER — MONTELUKAST SODIUM 10 MG PO TABS: 10.0000 mg | ORAL_TABLET | Freq: Every day | ORAL | Status: DC

## 2012-01-26 NOTE — Patient Instructions (Addendum)
Today we updated your med list in our EPIC system...    Continue your current medications the same...    We refilled your meds per request...  We reviewed your recent blood work & gave you a copy for your records...  We will arrange for a CT Chest to compare to the prev scan 12/12 in [facility]>    We will call you w/ the results when avail...  Call for any questions...  Keep up the GREAT JOB w/ weight reduction!!!
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
## 2012-01-28 ENCOUNTER — Ambulatory Visit (INDEPENDENT_AMBULATORY_CARE_PROVIDER_SITE_OTHER)
Admission: RE | Admit: 2012-01-28 | Discharge: 2012-01-28 | Disposition: A | Discharge status: Home / Self Care | Payer: BC Managed Care – PPO | Source: Ambulatory Visit | Attending: Pulmonary Disease | Admitting: Pulmonary Disease

## 2012-01-28 DIAGNOSIS — R918 Other nonspecific abnormal finding of lung field: Secondary | ICD-10-CM

## 2012-01-28 MED ORDER — IOHEXOL 300 MG/ML  SOLN
80.0000 mL | Freq: Once | INTRAMUSCULAR | Status: AC | PRN
  Administered 2012-01-28: 80 mL via INTRAVENOUS

## 2012-02-18 ENCOUNTER — Encounter (INDEPENDENT_AMBULATORY_CARE_PROVIDER_SITE_OTHER): Payer: Self-pay | Admitting: Surgery

## 2012-02-18 ENCOUNTER — Ambulatory Visit (INDEPENDENT_AMBULATORY_CARE_PROVIDER_SITE_OTHER): Payer: BC Managed Care – PPO | Admitting: Surgery

## 2012-02-18 ENCOUNTER — Other Ambulatory Visit (INDEPENDENT_AMBULATORY_CARE_PROVIDER_SITE_OTHER): Payer: Self-pay

## 2012-02-18 VITALS — BP 142/98 | HR 76 | Temp 97.6°F | Ht 64.0 in | Wt 192.0 lb

## 2012-02-18 DIAGNOSIS — K439 Ventral hernia without obstruction or gangrene: Secondary | ICD-10-CM

## 2012-02-18 NOTE — Progress Notes (Signed)
Chief Complaint:  Twice recurrent ventral hernia; the most recent repaired with a large piece of Physiomesh.    History of Present Illness:  Jaime Chapman is an 51 y.o. female who had hernia surgery on 2 occasions by Dr. Violeta Gelinas. Unfortunately she has had a recurrent hernia and a picture this is quite a large balls its in the supraumbilical region. On her last surgery he placed a large sheet of physeal mesh on the supraumbilical hernia. This started as a omental incarcerated 1/2 cm hernia the repair open.  I described laparoscopic approach with her. Forcibly she has lost some weight using Optifast. Avoid regular schedule for laparoscopically assisted ventral hernia repair with mesh. I told her I might try to use the mesh that we had or remove and replace it completely.  Past Medical History  Diagnosis Date  . Sinusitis   . Allergic rhinitis   . Asthma   . Dyslipidemia   . GERD (gastroesophageal reflux disease)   . Fibromyalgia   . Vitamin D deficiency   . Stress disorder, acute   . Obesity   . OSA (obstructive sleep apnea)   . Anxiety   . DDD (degenerative disc disease)     also has spinal arthritis  . Diabetes mellitus     borderline    Past Surgical History  Procedure Date  . Ventral hernia repair 09/2006    Dr. Janee Morn  . Laparoscopy 2002  . Endometrial ablation 2009  . Laparoscopic ventral hernia repair w/ mesh 08/2009    Dr. Janee Morn, [facility]  . Cesarean section 1982, 1985    x 2   . Tubal ligation     Current Outpatient Prescriptions  Medication Sig Dispense Refill  . albuterol (PROVENTIL HFA) 108 (90 BASE) MCG/ACT inhaler 2 puffs every 4 hours as needed for wheezing  6.7 each  11  . ALPRAZolam (XANAX) 0.5 MG tablet Take 1/2 to 1 tablet by mouth three times daily as needed for panic attacks  90 tablet  5  . BIOTIN PO Take by mouth daily.      . Cholecalciferol (VITAMIN D3) 1000 UNITS CAPS Take by mouth.      . cyclobenzaprine (AMRIX) 15 MG 24 hr capsule Take 15  mg by mouth daily as needed.      . fexofenadine (ALLEGRA) 180 MG tablet Take 180 mg by mouth daily.        . Fluticasone-Salmeterol (ADVAIR DISKUS) 100-50 MCG/DOSE AEPB Inhale 1 puff into the lungs 2 (two) times daily.  60 each  11  . MAGNESIUM PO Take by mouth daily.      [name] MINIVELLE 0.075 MG/24HR Apply 1 patch every day      . montelukast (SINGULAIR) 10 MG tablet Take 1 tablet (10 mg total) by mouth at bedtime.  30 tablet  11  . nabumetone (RELAFEN) 750 MG tablet Take 750 mg by mouth 2 (two) times daily. As needed for arthritis pain       . omeprazole (PRILOSEC) 20 MG capsule Take 1 capsule (20 mg total) by mouth 2 (two) times daily.  60 capsule  11  . medroxyPROGESTERone (PROVERA) 2.5 MG tablet        Progesterone; Codeine; and Cymbalta Family History  Problem Relation Age of Onset  . Lung cancer Maternal Grandmother   . Cancer Maternal Grandmother     lung  . Colon cancer Neg Hx   . Diabetes Mother   . Heart disease Mother   . Arthritis Mother  Social History:   reports that she quit smoking about 7 years ago. She has never used smokeless tobacco. She reports that she drinks alcohol. She reports that she does not use illicit drugs.   REVIEW OF SYSTEMS - PERTINENT POSITIVES ONLY: negative  Physical Exam:   Blood pressure 142/98, pulse 76, temperature 97.6 F (36.4 C), temperature source Temporal, height 5\' 4"  (1.626 m), weight 192 lb (87.091 kg), SpO2 98.00%. Body mass index is 32.96 kg/(m^2).  Gen:  WDWN [name]  Neurological: Alert and oriented to person, place, and time. Motor and sensory function is grossly intact  Head: Normocephalic and atraumatic.  Eyes: Conjunctivae are normal. Pupils are equal, round, and reactive to light. No scleral icterus.  Neck: Normal range of motion. Neck supple. No tracheal deviation or thyromegaly present.  Cardiovascular:  SR without murmurs or gallops.  No carotid bruits Respiratory: Effort normal.  No respiratory distress. No chest wall  tenderness. Breath sounds normal.  No wheezes, rales or rhonchi.  Abdomen:  Bulging mass above the umbilicus consistent with recurrent supraumbilical hernia GU: Musculoskeletal: Normal range of motion. Extremities are nontender. No cyanosis, edema or clubbing noted Lymphadenopathy: No cervical, preauricular, postauricular or axillary adenopathy is present Skin: Skin is warm and dry. No rash noted. No diaphoresis. No erythema. No pallor. Pscyh: Normal mood and affect. Behavior is normal. Judgment and thought content normal.   LABORATORY RESULTS: No results found for this or any previous visit (from the past 48 hour(s)).  RADIOLOGY RESULTS: No results found.  Problem List: Patient Active Problem List  Diagnosis  . VITAMIN D DEFICIENCY  . DYSLIPIDEMIA  . SINUSITIS, ACUTE  . ALLERGIC RHINITIS  . ASTHMA  . GERD  . VENTRAL HERNIA  . DEGENERATIVE DISC DISEASE  . FIBROMYALGIA  . SHORTNESS OF BREATH  . CHEST PAIN  . Physical exam, annual  . Panic anxiety syndrome  . Fatty liver disease, nonalcoholic  . Impaired fasting glucose  . Pulmonary nodules  . Overweight    Assessment & Plan: Twice recurrent ventral hernia.  Plan laparoscopic repair of ventral hernia    Matt B. Daphine Deutscher, MD, [facility] [phone] beeper [phone]  02/18/2012 10:17 AM

## 2012-02-18 NOTE — Patient Instructions (Signed)
Thanks for your patience.  If you need further assistance after leaving the office, please call our office and speak with a [facility] nurse.  [phone].  If you want to leave a message for Dr. Maytte Jacot, please call his office phone at [phone]. 

## 2012-04-03 ENCOUNTER — Encounter (HOSPITAL_COMMUNITY): Payer: Self-pay | Admitting: Pharmacy Technician

## 2012-04-04 ENCOUNTER — Telehealth (INDEPENDENT_AMBULATORY_CARE_PROVIDER_SITE_OTHER): Payer: Self-pay | Admitting: *Deleted

## 2012-04-04 ENCOUNTER — Encounter (HOSPITAL_COMMUNITY)
Admission: RE | Admit: 2012-04-04 | Discharge: 2012-04-04 | Disposition: A | Discharge status: Home / Self Care | Payer: BC Managed Care – PPO | Source: Ambulatory Visit | Attending: Surgery | Admitting: Surgery

## 2012-04-04 ENCOUNTER — Encounter (HOSPITAL_COMMUNITY): Payer: Self-pay

## 2012-04-04 HISTORY — DX: Other nonspecific abnormal finding of lung field: R91.8

## 2012-04-04 LAB — CBC
MCH: 29.9 pg (ref 26.0–34.0)
MCHC: 34.1 g/dL (ref 30.0–36.0)
MCV: 87.7 fL (ref 78.0–100.0)
Platelets: 259 10*3/uL (ref 150–400)
RDW: 12.3 % (ref 11.5–15.5)
WBC: 7.9 10*3/uL (ref 4.0–10.5)

## 2012-04-04 LAB — BASIC METABOLIC PANEL
Calcium: 9.5 mg/dL (ref 8.4–10.5)
Creatinine, Ser: 0.79 mg/dL (ref 0.50–1.10)
GFR calc Af Amer: >90 mL/min (ref >90)
GFR calc non Af Amer: >90 mL/min (ref >90)

## 2012-04-04 MED ORDER — CHLORHEXIDINE GLUCONATE 4 % EX LIQD
1.0000 "application " | Freq: Once | CUTANEOUS | Status: DC
  Filled 2012-04-04: qty 15

## 2012-04-04 MED ORDER — CHLORHEXIDINE GLUCONATE 4 % EX LIQD: 1.0000 "application " | Freq: Once | CUTANEOUS | Status: DC

## 2012-04-04 NOTE — Pre-Procedure Instructions (Signed)
PREOP CBC, BMET, SERUM PREGNANCY TEST, EKG WERE DONE TODAY AT [facility] AS PER ANESTHESIOLOGIST'S GUIDELINES.  PT HAS CHEST CT REPORT IN EPIC DONE 02/01/12.

## 2012-04-04 NOTE — Patient Instructions (Addendum)
YOUR SURGERY IS SCHEDULED AT [facility]  ON:   Friday  2/14  REPORT TO [place] [facility]AY [facility] AT:  5:30 AM      PHONE # FOR SHORT STAY IS [phone]             FLEETS ENEMA THE NIGHT BEFORE YOUR SURGERY.  DO NOT EAT OR DRINK ANYTHING AFTER MIDNIGHT THE NIGHT BEFORE YOUR SURGERY.  YOU MAY BRUSH YOUR TEETH, RINSE OUT YOUR MOUTH--BUT NO WATER, NO FOOD, NO CHEWING GUM, NO MINTS, NO CANDIES, NO CHEWING TOBACCO.  PLEASE TAKE THE FOLLOWING MEDICATIONS THE AM OF YOUR SURGERY WITH A FEW SIPS OF WATER:  ALPRAZOLAM IF NEEDED, OMEPRAZOLE, FEXOFENADINE.  USE YOUR ADVAIR AND ALBUTEROL INHALERS AND BRING THE ALBUTEROL TO SURGERY.  MAY WEAR HORMONE PATCH.  IF YOU USE INHALERS--USE YOUR INHALERS THE AM OF YOUR SURGERY AND BRING INHALERS TO THE HOSPITAL -TAKE TO SURGERY.    IF YOU ARE DIABETIC:  DO NOT TAKE ANY DIABETIC MEDICATIONS THE AM OF YOUR SURGERY.  IF YOU TAKE INSULIN IN THE EVENINGS--PLEASE ONLY TAKE 1/2 NORMAL EVENING DOSE THE NIGHT BEFORE YOUR SURGERY.  NO INSULIN THE AM OF YOUR SURGERY.  IF YOU HAVE SLEEP APNEA AND USE CPAP OR BIPAP--PLEASE BRING THE MASK AND THE TUBING.  DO NOT BRING YOUR MACHINE.  DO NOT BRING VALUABLES, MONEY, CREDIT CARDS.  DO NOT WEAR JEWELRY, MAKE-UP, NAIL POLISH AND NO METAL PINS OR CLIPS IN YOUR HAIR. CONTACT LENS, DENTURES / PARTIALS, GLASSES SHOULD NOT BE WORN TO SURGERY AND IN MOST CASES-HEARING AIDS WILL NEED TO BE REMOVED.  BRING YOUR GLASSES CASE, ANY EQUIPMENT NEEDED FOR YOUR CONTACT LENS. FOR PATIENTS ADMITTED TO THE HOSPITAL--CHECK OUT TIME THE DAY OF DISCHARGE IS 11:00 AM.  ALL INPATIENT ROOMS ARE PRIVATE - WITH BATHROOM, TELEPHONE, TELEVISION AND WIFI INTERNET.  IF YOU ARE BEING DISCHARGED THE SAME DAY OF YOUR SURGERY--YOU CAN NOT DRIVE YOURSELF HOME--AND SHOULD NOT GO HOME ALONE BY TAXI OR BUS.  NO DRIVING OR OPERATING MACHINERY FOR 24 HOURS FOLLOWING ANESTHESIA / PAIN MEDICATIONS.  PLEASE MAKE ARRANGEMENTS FOR SOMEONE TO BE WITH YOU AT HOME  THE FIRST 24 HOURS AFTER SURGERY. RESPONSIBLE DRIVER'S NAME  DENNIS Vilar - PT'S HUSBAND                                               PHONE #  [phone]                              PLEASE READ OVER ANY  FACT SHEETS THAT YOU WERE GIVEN: MRSA INFORMATION, BLOOD TRANSFUSION INFORMATION, INCENTIVE SPIROMETER INFORMATION. FAILURE TO FOLLOW THESE INSTRUCTIONS MAY RESULT IN THE CANCELLATION OF YOUR SURGERY.   PATIENT SIGNATURE_________________________________

## 2012-04-04 NOTE — Telephone Encounter (Signed)
Patient called to question whether she was suppose to do the GoLytely along with a fleets enema.  Spoke to Dr. Daphine Deutscher who stated to only use the GoLytely.  Patient advised.

## 2012-04-13 NOTE — H&P (Signed)
Chief Complaint: Twice recurrent ventral hernia; the most recent repaired with a large piece of Physiomesh.  History of Present Illness: Jaime Chapman is an 52 y.o. female who had hernia surgery on 2 occasions by Dr. Violeta Gelinas. Unfortunately she has developed a recurrent hernia  in the supraumbilical region. On her last surgery he placed a large sheet of Physio mesh on the supraumbilical hernia. This started as a omental incarcerated 1/2 cm hernia the repair open.  I described laparoscopic approach with her. Fortunately she has lost some weight using Optifast. Will plan to  schedule for laparoscopically assisted ventral hernia repair with mesh. I told her I might try to use the mesh that we had or remove and replace it completely.  Past Medical History   Diagnosis  Date   .  Sinusitis    .  Allergic rhinitis    .  Asthma    .  Dyslipidemia    .  GERD (gastroesophageal reflux disease)    .  Fibromyalgia    .  Vitamin D deficiency    .  Stress disorder, acute    .  Obesity    .  OSA (obstructive sleep apnea)    .  Anxiety    .  DDD (degenerative disc disease)      also has spinal arthritis   .  Diabetes mellitus      borderline    Past Surgical History   Procedure  Date   .  Ventral hernia repair  09/2006     Dr. Janee Morn   .  Laparoscopy  2002   .  Endometrial ablation  2009   .  Laparoscopic ventral hernia repair w/ mesh  08/2009     Dr. Janee Morn, [facility]   .  Cesarean section  1982, 1985     x 2   .  Tubal ligation     Current Outpatient Prescriptions   Medication  Sig  Dispense  Refill   .  albuterol (PROVENTIL HFA) 108 (90 BASE) MCG/ACT inhaler  2 puffs every 4 hours as needed for wheezing  6.7 each  11   .  ALPRAZolam (XANAX) 0.5 MG tablet  Take 1/2 to 1 tablet by mouth three times daily as needed for panic attacks  90 tablet  5   .  BIOTIN PO  Take by mouth daily.     .  Cholecalciferol (VITAMIN D3) 1000 UNITS CAPS  Take by mouth.     .  cyclobenzaprine (AMRIX) 15 MG 24  hr capsule  Take 15 mg by mouth daily as needed.     .  fexofenadine (ALLEGRA) 180 MG tablet  Take 180 mg by mouth daily.     .  Fluticasone-Salmeterol (ADVAIR DISKUS) 100-50 MCG/DOSE AEPB  Inhale 1 puff into the lungs 2 (two) times daily.  60 each  11   .  MAGNESIUM PO  Take by mouth daily.     [name]  MINIVELLE 0.075 MG/24HR  Apply 1 patch every day     .  montelukast (SINGULAIR) 10 MG tablet  Take 1 tablet (10 mg total) by mouth at bedtime.  30 tablet  11   .  nabumetone (RELAFEN) 750 MG tablet  Take 750 mg by mouth 2 (two) times daily. As needed for arthritis pain     .  omeprazole (PRILOSEC) 20 MG capsule  Take 1 capsule (20 mg total) by mouth 2 (two) times daily.  60 capsule  11   .  medroxyPROGESTERone (PROVERA) 2.5 MG tablet      Progesterone; Codeine; and Cymbalta  Family History   Problem  Relation  Age of Onset   .  Lung cancer  Maternal Grandmother    .  Cancer  Maternal Grandmother       lung    .  Colon cancer  Neg Hx    .  Diabetes  Mother    .  Heart disease  Mother    .  Arthritis  Mother    Social History: reports that she quit smoking about 7 years ago. She has never used smokeless tobacco. She reports that she drinks alcohol. She reports that she does not use illicit drugs.  REVIEW OF SYSTEMS - PERTINENT POSITIVES ONLY:  negative  Physical Exam:  Blood pressure 142/98, pulse 76, temperature 97.6 F (36.4 C), temperature source Temporal, height 5\' 4"  (1.626 m), weight 192 lb (87.091 kg), SpO2 98.00%.  Body mass index is 32.96 kg/(m^2).  Gen: WDWN [name]  Neurological: Alert and oriented to person, place, and time. Motor and sensory function is grossly intact  Head: Normocephalic and atraumatic.  Eyes: Conjunctivae are normal. Pupils are equal, round, and reactive to light. No scleral icterus.  Neck: Normal range of motion. Neck supple. No tracheal deviation or thyromegaly present.  Cardiovascular: SR without murmurs or gallops. No carotid bruits  Respiratory: Effort  normal. No respiratory distress. No chest wall tenderness. Breath sounds normal. No wheezes, rales or rhonchi.  Abdomen: Bulging mass above the umbilicus consistent with recurrent supraumbilical hernia  GU:  Musculoskeletal: Normal range of motion. Extremities are nontender. No cyanosis, edema or clubbing noted Lymphadenopathy: No cervical, preauricular, postauricular or axillary adenopathy is present Skin: Skin is warm and dry. No rash noted. No diaphoresis. No erythema. No pallor. Pscyh: Normal mood and affect. Behavior is normal. Judgment and thought content normal.  LABORATORY RESULTS:  No results found for this or any previous visit (from the past 48 hour(s)).  RADIOLOGY RESULTS:  No results found.  Problem List:  Patient Active Problem List   Diagnosis   .  VITAMIN D DEFICIENCY   .  DYSLIPIDEMIA   .  SINUSITIS, ACUTE   .  ALLERGIC RHINITIS   .  ASTHMA   .  GERD   .  VENTRAL HERNIA   .  DEGENERATIVE DISC DISEASE   .  FIBROMYALGIA   .  SHORTNESS OF BREATH   .  CHEST PAIN   .  Physical exam, annual   .  Panic anxiety syndrome   .  Fatty liver disease, nonalcoholic   .  Impaired fasting glucose   .  Pulmonary nodules   .  Overweight   Assessment & Plan:  Twice recurrent ventral hernia. Plan laparoscopic repair of ventral hernia  Matt B. Daphine Deutscher, MD, [facility]  [phone] beeper  [phone]

## 2012-04-14 ENCOUNTER — Ambulatory Visit (HOSPITAL_COMMUNITY): Payer: BC Managed Care – PPO | Admitting: Certified Registered Nurse Anesthetist

## 2012-04-14 ENCOUNTER — Encounter (HOSPITAL_COMMUNITY): Payer: Self-pay

## 2012-04-14 ENCOUNTER — Encounter (HOSPITAL_COMMUNITY)
Admission: RE | Disposition: A | Discharge status: Home / Self Care | Payer: Self-pay | Source: Ambulatory Visit | Attending: Surgery

## 2012-04-14 ENCOUNTER — Observation Stay (HOSPITAL_COMMUNITY)
Admission: RE | Admit: 2012-04-14 | Discharge: 2012-04-16 | Disposition: A | Discharge status: Home / Self Care | Payer: BC Managed Care – PPO | Source: Ambulatory Visit | Attending: Surgery | Admitting: Surgery

## 2012-04-14 ENCOUNTER — Encounter (HOSPITAL_COMMUNITY): Payer: Self-pay | Admitting: Certified Registered Nurse Anesthetist

## 2012-04-14 DIAGNOSIS — R7309 Other abnormal glucose: Secondary | ICD-10-CM | POA: Insufficient documentation

## 2012-04-14 DIAGNOSIS — K439 Ventral hernia without obstruction or gangrene: Secondary | ICD-10-CM

## 2012-04-14 DIAGNOSIS — K432 Incisional hernia without obstruction or gangrene: Principal | ICD-10-CM | POA: Insufficient documentation

## 2012-04-14 DIAGNOSIS — J45909 Unspecified asthma, uncomplicated: Secondary | ICD-10-CM | POA: Insufficient documentation

## 2012-04-14 DIAGNOSIS — E785 Hyperlipidemia, unspecified: Secondary | ICD-10-CM | POA: Insufficient documentation

## 2012-04-14 DIAGNOSIS — G4733 Obstructive sleep apnea (adult) (pediatric): Secondary | ICD-10-CM | POA: Insufficient documentation

## 2012-04-14 DIAGNOSIS — K219 Gastro-esophageal reflux disease without esophagitis: Secondary | ICD-10-CM | POA: Insufficient documentation

## 2012-04-14 DIAGNOSIS — IMO0001 Reserved for inherently not codable concepts without codable children: Secondary | ICD-10-CM | POA: Insufficient documentation

## 2012-04-14 DIAGNOSIS — Z79899 Other long term (current) drug therapy: Secondary | ICD-10-CM | POA: Insufficient documentation

## 2012-04-14 HISTORY — PX: VENTRAL HERNIA REPAIR: SHX424

## 2012-04-14 HISTORY — PX: HERNIA REPAIR: SHX51

## 2012-04-14 LAB — CBC
HCT: 39.6 % (ref 36.0–46.0)
Platelets: 225 10*3/uL (ref 150–400)
RBC: 4.53 MIL/uL (ref 3.87–5.11)
RDW: 12.3 % (ref 11.5–15.5)
WBC: 11.1 10*3/uL — ABNORMAL HIGH (ref 4.0–10.5)

## 2012-04-14 LAB — CREATININE, SERUM
GFR calc Af Amer: >90 mL/min (ref >90)
GFR calc non Af Amer: >90 mL/min (ref >90)

## 2012-04-14 LAB — GLUCOSE, CAPILLARY

## 2012-04-14 SURGERY — REPAIR, HERNIA, VENTRAL, LAPAROSCOPIC
Anesthesia: General | Wound class: Clean

## 2012-04-14 MED ORDER — ALBUTEROL SULFATE HFA 108 (90 BASE) MCG/ACT IN AERS
1.0000 | INHALATION_SPRAY | RESPIRATORY_TRACT | Status: DC | PRN
  Filled 2012-04-14: qty 6.7

## 2012-04-14 MED ORDER — HEPARIN SODIUM (PORCINE) 5000 UNIT/ML IJ SOLN
5000.0000 [IU] | Freq: Three times a day (TID) | INTRAMUSCULAR | Status: DC
  Administered 2012-04-14 – 2012-04-16 (×6): 5000 [IU] via SUBCUTANEOUS
  Filled 2012-04-14 (×9): qty 1

## 2012-04-14 MED ORDER — BUPIVACAINE-EPINEPHRINE 0.25% -1:200000 IJ SOLN
INTRAMUSCULAR | Status: AC
  Filled 2012-04-14: qty 1

## 2012-04-14 MED ORDER — PANTOPRAZOLE SODIUM 40 MG PO TBEC
40.0000 mg | DELAYED_RELEASE_TABLET | Freq: Every day | ORAL | Status: DC
  Administered 2012-04-14 – 2012-04-16 (×3): 40 mg via ORAL
  Filled 2012-04-14 (×3): qty 1

## 2012-04-14 MED ORDER — HYDROMORPHONE HCL PF 1 MG/ML IJ SOLN
INTRAMUSCULAR | Status: AC
  Filled 2012-04-14: qty 1

## 2012-04-14 MED ORDER — KCL IN DEXTROSE-NACL 20-5-0.45 MEQ/L-%-% IV SOLN
INTRAVENOUS | Status: AC
  Filled 2012-04-14: qty 1000

## 2012-04-14 MED ORDER — HETASTARCH-ELECTROLYTES 6 % IV SOLN
INTRAVENOUS | Status: DC | PRN
  Administered 2012-04-14: 09:00:00 via INTRAVENOUS

## 2012-04-14 MED ORDER — MONTELUKAST SODIUM 10 MG PO TABS
10.0000 mg | ORAL_TABLET | Freq: Every day | ORAL | Status: DC
Start: 2012-04-14 — End: 2012-04-16
  Administered 2012-04-14 – 2012-04-15 (×2): 10 mg via ORAL
  Filled 2012-04-14 (×3): qty 1

## 2012-04-14 MED ORDER — DEXAMETHASONE SODIUM PHOSPHATE 10 MG/ML IJ SOLN
INTRAMUSCULAR | Status: DC | PRN
  Administered 2012-04-14: 10 mg via INTRAVENOUS

## 2012-04-14 MED ORDER — GLYCOPYRROLATE 0.2 MG/ML IJ SOLN
INTRAMUSCULAR | Status: DC | PRN
  Administered 2012-04-14: 0.6 mg via INTRAVENOUS

## 2012-04-14 MED ORDER — NEOSTIGMINE METHYLSULFATE 1 MG/ML IJ SOLN
INTRAMUSCULAR | Status: DC | PRN
  Administered 2012-04-14: 5 mg via INTRAVENOUS

## 2012-04-14 MED ORDER — HYDROMORPHONE HCL PF 1 MG/ML IJ SOLN
0.2500 mg | INTRAMUSCULAR | Status: DC | PRN
  Administered 2012-04-14 (×3): 0.5 mg via INTRAVENOUS

## 2012-04-14 MED ORDER — DEXTROSE 5 % IV SOLN
2.0000 g | INTRAVENOUS | Status: AC
  Administered 2012-04-14: 2 g via INTRAVENOUS

## 2012-04-14 MED ORDER — FENTANYL CITRATE 0.05 MG/ML IJ SOLN
INTRAMUSCULAR | Status: DC | PRN
  Administered 2012-04-14 (×5): 50 ug via INTRAVENOUS

## 2012-04-14 MED ORDER — MIDAZOLAM HCL 5 MG/5ML IJ SOLN
INTRAMUSCULAR | Status: DC | PRN
  Administered 2012-04-14: 2 mg via INTRAVENOUS

## 2012-04-14 MED ORDER — ONDANSETRON HCL 4 MG/2ML IJ SOLN
INTRAMUSCULAR | Status: DC | PRN
  Administered 2012-04-14: 4 mg via INTRAVENOUS

## 2012-04-14 MED ORDER — BUPIVACAINE LIPOSOME 1.3 % IJ SUSP
20.0000 mL | Freq: Once | INTRAMUSCULAR | Status: AC
  Administered 2012-04-14: 20 mL
  Filled 2012-04-14: qty 20

## 2012-04-14 MED ORDER — KCL IN DEXTROSE-NACL 20-5-0.45 MEQ/L-%-% IV SOLN
INTRAVENOUS | Status: DC
  Administered 2012-04-14: 12:00:00 via INTRAVENOUS
  Administered 2012-04-14 – 2012-04-15 (×2): 100 mL/h via INTRAVENOUS
  Administered 2012-04-15: 20:00:00 via INTRAVENOUS
  Filled 2012-04-14 (×5): qty 1000

## 2012-04-14 MED ORDER — HEPARIN SODIUM (PORCINE) 5000 UNIT/ML IJ SOLN
5000.0000 [IU] | Freq: Once | INTRAMUSCULAR | Status: AC
  Administered 2012-04-14: 5000 [IU] via SUBCUTANEOUS
  Filled 2012-04-14: qty 1

## 2012-04-14 MED ORDER — ONDANSETRON HCL 4 MG/2ML IJ SOLN
4.0000 mg | Freq: Four times a day (QID) | INTRAMUSCULAR | Status: DC | PRN
  Administered 2012-04-14: 4 mg via INTRAVENOUS
  Filled 2012-04-14: qty 2

## 2012-04-14 MED ORDER — OXYCODONE HCL 5 MG/5ML PO SOLN
5.0000 mg | Freq: Once | ORAL | Status: DC | PRN
  Filled 2012-04-14: qty 5

## 2012-04-14 MED ORDER — HYDROMORPHONE HCL PF 1 MG/ML IJ SOLN
0.5000 mg | INTRAMUSCULAR | Status: DC | PRN
  Administered 2012-04-14 – 2012-04-15 (×6): 0.5 mg via INTRAVENOUS
  Filled 2012-04-14 (×6): qty 1

## 2012-04-14 MED ORDER — ONDANSETRON HCL 4 MG PO TABS: 4.0000 mg | ORAL_TABLET | Freq: Four times a day (QID) | ORAL | Status: DC | PRN

## 2012-04-14 MED ORDER — LACTATED RINGERS IR SOLN
Status: DC | PRN
  Administered 2012-04-14 (×2): 1000 mL

## 2012-04-14 MED ORDER — CEFOXITIN SODIUM-DEXTROSE 1-4 GM-% IV SOLR (PREMIX)
INTRAVENOUS | Status: AC
  Filled 2012-04-14: qty 100

## 2012-04-14 MED ORDER — LIDOCAINE HCL 4 % MT SOLN
OROMUCOSAL | Status: DC | PRN
  Administered 2012-04-14: 4 mL via TOPICAL

## 2012-04-14 MED ORDER — 0.9 % SODIUM CHLORIDE (POUR BTL) OPTIME
TOPICAL | Status: DC | PRN
  Administered 2012-04-14: 1000 mL

## 2012-04-14 MED ORDER — METOCLOPRAMIDE HCL 5 MG/ML IJ SOLN: 10.0000 mg | Freq: Once | INTRAMUSCULAR | Status: DC | PRN

## 2012-04-14 MED ORDER — OXYCODONE HCL 5 MG PO TABS: 5.0000 mg | ORAL_TABLET | Freq: Once | ORAL | Status: DC | PRN

## 2012-04-14 MED ORDER — ACETAMINOPHEN 10 MG/ML IV SOLN
INTRAVENOUS | Status: AC
  Filled 2012-04-14: qty 100

## 2012-04-14 MED ORDER — ALPRAZOLAM 0.5 MG PO TABS
0.5000 mg | ORAL_TABLET | Freq: Three times a day (TID) | ORAL | Status: DC | PRN
  Administered 2012-04-14: 0.5 mg via ORAL
  Filled 2012-04-14: qty 1

## 2012-04-14 MED ORDER — PROPOFOL 10 MG/ML IV BOLUS
INTRAVENOUS | Status: DC | PRN
  Administered 2012-04-14: 150 mg via INTRAVENOUS

## 2012-04-14 MED ORDER — SUCCINYLCHOLINE CHLORIDE 20 MG/ML IJ SOLN
INTRAMUSCULAR | Status: DC | PRN
  Administered 2012-04-14: 100 mg via INTRAVENOUS

## 2012-04-14 MED ORDER — ACETAMINOPHEN 10 MG/ML IV SOLN
INTRAVENOUS | Status: DC | PRN
  Administered 2012-04-14: 1000 mg via INTRAVENOUS

## 2012-04-14 MED ORDER — LACTATED RINGERS IV SOLN
INTRAVENOUS | Status: DC | PRN
  Administered 2012-04-14: 07:00:00 via INTRAVENOUS

## 2012-04-14 MED ORDER — ROCURONIUM BROMIDE 100 MG/10ML IV SOLN
INTRAVENOUS | Status: DC | PRN
  Administered 2012-04-14 (×2): 10 mg via INTRAVENOUS
  Administered 2012-04-14: 40 mg via INTRAVENOUS
  Administered 2012-04-14: 10 mg via INTRAVENOUS

## 2012-04-14 SURGICAL SUPPLY — 61 items
BENZOIN TINCTURE PRP APPL 2/3 (GAUZE/BANDAGES/DRESSINGS) IMPLANT
BINDER ABD UNIV 12 45-62 (WOUND CARE) IMPLANT
BINDER ABDOMINAL 46IN 62IN (WOUND CARE)
CABLE HI FREQUENCY MONOPOLAR (ELECTROSURGICAL) ×2 IMPLANT
CANISTER SUCTION 2500CC (MISCELLANEOUS) ×2 IMPLANT
CANNULA ENDOPATH XCEL 11M (ENDOMECHANICALS) IMPLANT
CLOTH BEACON ORANGE TIMEOUT ST (SAFETY) ×2 IMPLANT
COVER SURGICAL LIGHT HANDLE (MISCELLANEOUS) IMPLANT
DECANTER SPIKE VIAL GLASS SM (MISCELLANEOUS) IMPLANT
DEVICE SECURE STRAP 25 ABSORB (INSTRUMENTS) IMPLANT
DEVICE TROCAR PUNCTURE CLOSURE (ENDOMECHANICALS) ×2 IMPLANT
DISSECTOR BLUNT TIP ENDO 5MM (MISCELLANEOUS) IMPLANT
DRAIN CHANNEL RND F F (WOUND CARE) IMPLANT
DRAPE CAMERA CLOSED 9X96 (DRAPES) ×2 IMPLANT
DRAPE LAPAROSCOPIC ABDOMINAL (DRAPES) ×2 IMPLANT
ELECT REM PT RETURN 9FT ADLT (ELECTROSURGICAL) ×2
ELECTRODE REM PT RTRN 9FT ADLT (ELECTROSURGICAL) ×1 IMPLANT
EVACUATOR SILICONE 100CC (DRAIN) IMPLANT
GAUZE SPONGE 2X2 8PLY STRL LF (GAUZE/BANDAGES/DRESSINGS) ×1 IMPLANT
GLOVE BIOGEL M 8.0 STRL (GLOVE) ×2 IMPLANT
GLOVE BIOGEL PI IND STRL 7.0 (GLOVE) ×3 IMPLANT
GLOVE BIOGEL PI INDICATOR 7.0 (GLOVE) ×3
GOWN STRL NON-REIN LRG LVL3 (GOWN DISPOSABLE) ×4 IMPLANT
GOWN STRL REIN XL XLG (GOWN DISPOSABLE) ×4 IMPLANT
HAND ACTIVATED (MISCELLANEOUS) IMPLANT
IV LACTATED RINGER IRRG 3000ML (IV SOLUTION)
IV LR IRRIG 3000ML ARTHROMATIC (IV SOLUTION) IMPLANT
KIT BASIN OR (CUSTOM PROCEDURE TRAY) ×2 IMPLANT
MARKER SKIN DUAL TIP RULER LAB (MISCELLANEOUS) IMPLANT
NEEDLE SPNL 22GX3.5 QUINCKE BK (NEEDLE) ×2 IMPLANT
NS IRRIG 1000ML POUR BTL (IV SOLUTION) IMPLANT
PENCIL BUTTON HOLSTER BLD 10FT (ELECTRODE) ×2 IMPLANT
SCISSORS LAP 5X35 DISP (ENDOMECHANICALS) ×2 IMPLANT
SET IRRIG TUBING LAPAROSCOPIC (IRRIGATION / IRRIGATOR) ×2 IMPLANT
SHEARS CURVED HARMONIC AC 45CM (MISCELLANEOUS) ×2 IMPLANT
SLEEVE XCEL OPT CAN 5 100 (ENDOMECHANICALS) IMPLANT
SLEEVE Z-THREAD 5X100MM (TROCAR) IMPLANT
SOLUTION ANTI FOG 6CC (MISCELLANEOUS) ×2 IMPLANT
SPONGE GAUZE 2X2 STER 10/PKG (GAUZE/BANDAGES/DRESSINGS) ×1
SPONGE GAUZE 4X4 12PLY (GAUZE/BANDAGES/DRESSINGS) ×4 IMPLANT
SPONGE LAP 18X18 X RAY DECT (DISPOSABLE) ×2 IMPLANT
STAPLER VISISTAT 35W (STAPLE) ×2 IMPLANT
STRIP CLOSURE SKIN 1/2X4 (GAUZE/BANDAGES/DRESSINGS) IMPLANT
SUT NOVA 0 T19/GS 22DT (SUTURE) IMPLANT
SUT NOVA 1 T20/GS 25DT (SUTURE) IMPLANT
SUT NOVA NAB DX-16 0-1 5-0 T12 (SUTURE) ×4 IMPLANT
SUT PROLENE 0 CT 1 CR/8 (SUTURE) IMPLANT
SUT VIC AB 4-0 SH 18 (SUTURE) ×2 IMPLANT
SYR 30ML LL (SYRINGE) ×2 IMPLANT
TACKER 5MM HERNIA 3.5CML NAB (ENDOMECHANICALS) IMPLANT
TAPE CLOTH SURG 4X10 WHT LF (GAUZE/BANDAGES/DRESSINGS) ×2 IMPLANT
TRAY FOLEY CATH 14FRSI W/METER (CATHETERS) ×2 IMPLANT
TRAY LAP CHOLE (CUSTOM PROCEDURE TRAY) ×2 IMPLANT
TROCAR BLADELESS OPT 5 100 (ENDOMECHANICALS) ×6 IMPLANT
TROCAR HASSON GELL 12X100 (TROCAR) IMPLANT
TROCAR XCEL NON-BLD 11X100MML (ENDOMECHANICALS) ×2 IMPLANT
TROCAR Z-THREAD FIOS 11X100 BL (TROCAR) IMPLANT
TROCAR Z-THREAD FIOS 5X100MM (TROCAR) IMPLANT
TROCAR Z-THREAD SLEEVE 11X100 (TROCAR) IMPLANT
TUBING FILTER THERMOFLATOR (ELECTROSURGICAL) IMPLANT
TUBING INSUFFLATION 10FT LAP (TUBING) ×2 IMPLANT

## 2012-04-14 NOTE — Anesthesia Postprocedure Evaluation (Signed)
Anesthesia Post Note  Patient: Jaime Chapman  Procedure(s) Performed: Procedure(s) (LRB): Laparoscopic Repair of  Ventral Hernia (N/A)  Anesthesia type: general  Patient location: PACU  Post pain: Pain level controlled  Post assessment: Patient's Cardiovascular Status Stable  Last Vitals:  Filed Vitals:   04/14/12 1156  BP: 157/95  Pulse: 98  Temp: 36.4 C  Resp: 14    Post vital signs: Reviewed and stable  Level of consciousness: sedated  Complications: No apparent anesthesia complications

## 2012-04-14 NOTE — Op Note (Signed)
Surgeon: Wenda Low, MD, FACS  Asst:  none  Anes:  general  Procedure: Laparoscopic enterolysis and delineation of recurrent ventral hernia, open primary closure  Diagnosis: Twice recurrent ventral hernia  Complications: None seen  EBL:   20 cc  Description of Procedure:  The patient Was taken to or 11 in given general anesthesia. The abdomen was prepped with PCMX and draped sterilely. After a timeout the abdomen was entered to the left upper quadrant using a 5 mm 0 Optiview technique without difficulty. The patient had a twice recurrent ventral hernia in the supraumbilical region and she had extensive adhesions to this. I placed ultimately 2 other 5 mm probe trochars in the left lower quadrant and right lower quadrant. Alternating the camera port was able to work away around this and using sharp dissection mainly and sometimes a harmonic scalpel I took down these adhesions. Over on the left side the patient had incarcerated omental fat up into the hernia itself which I was able to reduce and then cut away the pseudo-sac but was entangled within the mesh which was a previous replaced physio-mesh. This appeared to have pulled away superiorly allowing the recurrent herniation. There were areas however where the mesh was very firmly adherent to the abdominal wall. Following this I elected to excise the old scar over the small hole and excised the sac which I was able to do under laparoscopic vision. The bowel was well away from this. After doing this I was able to check the area that was stuck up there and I should pulled up into the wound and examined it and there was no evidence there is any injury to the bowel. The defect looked like it would closed transversely better and with wide bites full thickness I would incorporate mesh into this closure. I placed approximately 6 sutures transversely to close the defect which was about 2 fingerbreadths in diameter defect. Following the placement of these I  then tied them. We then reinsufflated and allowed for closure from the inside and it looked good. I further examined and irrigated and found slowly oozing areas on the omentum which I controlled with the harmonic. The wounds were injected with Exparel. There were closed 4-0 Vicryl with benzoin and Steri-Strips. Patient our the procedure well taken recovery room in satisfactory condition.  Matt B. Daphine Deutscher, MD, [facility], [place] [phone]

## 2012-04-14 NOTE — Preoperative (Signed)
Beta Blockers   Reason not to administer Beta Blockers:Not Applicable 

## 2012-04-14 NOTE — Progress Notes (Signed)
Husband has inhalers

## 2012-04-14 NOTE — Anesthesia Preprocedure Evaluation (Signed)
Anesthesia Evaluation  Patient identified by MRN, date of birth, ID band Patient awake    Reviewed: Allergy & Precautions, H&P , NPO status , Patient's Chart, lab work & pertinent test results, reviewed documented beta blocker date and time   Airway Mallampati: II TM Distance: >3 FB Neck ROM: full    Dental   Pulmonary shortness of breath and with exertion, asthma ,  breath sounds clear to auscultation        Cardiovascular negative cardio ROS  Rhythm:regular     Neuro/Psych PSYCHIATRIC DISORDERS  Neuromuscular disease negative neurological ROS     GI/Hepatic Neg liver ROS, GERD-  Medicated and Controlled,  Endo/Other  diabetes  Renal/GU negative Renal ROS  negative genitourinary   Musculoskeletal   Abdominal   Peds  Hematology negative hematology ROS (+)   Anesthesia Other Findings See surgeon's H&P   Reproductive/Obstetrics negative OB ROS                           Anesthesia Physical Anesthesia Plan  ASA: III  Anesthesia Plan: General   Post-op Pain Management:    Induction: Intravenous  Airway Management Planned: Oral ETT  Additional Equipment:   Intra-op Plan:   Post-operative Plan: Extubation in OR  Informed Consent: I have reviewed the patients History and Physical, chart, labs and discussed the procedure including the risks, benefits and alternatives for the proposed anesthesia with the patient or authorized representative who has indicated his/her understanding and acceptance.   Dental Advisory Given  Plan Discussed with: CRNA and Surgeon  Anesthesia Plan Comments:         Anesthesia Quick Evaluation

## 2012-04-14 NOTE — Transfer of Care (Signed)
Immediate Anesthesia Transfer of Care Note  Patient: Jaime Chapman  Procedure(s) Performed: Procedure(s) (LRB): Laparoscopic Repair of  Ventral Hernia (N/A)  Patient Location: PACU  Anesthesia Type: General  Level of Consciousness: sedated, patient cooperative and responds to stimulaton  Airway & Oxygen Therapy: Patient Spontanous Breathing and Patient connected to face mask oxgen  Post-op Assessment: Report given to PACU RN and Post -op Vital signs reviewed and stable  Post vital signs: Reviewed and stable  Complications: No apparent anesthesia complications

## 2012-04-15 LAB — CBC
Hemoglobin: 12 g/dL (ref 12.0–15.0)
MCHC: 33.4 g/dL (ref 30.0–36.0)
RDW: 12.4 % (ref 11.5–15.5)
WBC: 11.2 10*3/uL — ABNORMAL HIGH (ref 4.0–10.5)

## 2012-04-15 LAB — BASIC METABOLIC PANEL
GFR calc Af Amer: >90 mL/min (ref >90)
GFR calc non Af Amer: >90 mL/min (ref >90)
Potassium: 3.6 mEq/L (ref 3.5–5.1)
Sodium: 137 mEq/L (ref 135–145)

## 2012-04-15 MED ORDER — OXYCODONE-ACETAMINOPHEN 5-325 MG PO TABS
1.0000 | ORAL_TABLET | ORAL | Status: DC | PRN
  Administered 2012-04-15 – 2012-04-16 (×4): 2 via ORAL
  Filled 2012-04-15 (×4): qty 2

## 2012-04-15 MED ORDER — CHLORHEXIDINE GLUCONATE 0.12 % MT SOLN
15.0000 mL | Freq: Two times a day (BID) | OROMUCOSAL | Status: DC
  Administered 2012-04-15 (×2): 15 mL via OROMUCOSAL
  Filled 2012-04-15 (×5): qty 15

## 2012-04-15 MED ORDER — METHOCARBAMOL 500 MG PO TABS: 750.0000 mg | ORAL_TABLET | Freq: Four times a day (QID) | ORAL | Status: DC | PRN

## 2012-04-15 MED ORDER — BIOTENE DRY MOUTH MT LIQD
15.0000 mL | Freq: Two times a day (BID) | OROMUCOSAL | Status: DC
  Administered 2012-04-15 (×2): 15 mL via OROMUCOSAL

## 2012-04-15 NOTE — Progress Notes (Signed)
Patient ID: Jaime Chapman, female   DOB: November 30, 1960, 52 y.o.   MRN: [id number] 1 Day Post-Op  Subjective: Moderate pain, controlled with meds. Some nausea yesterday but this is resolved and she is tolerating clear liquid diet. No flatus or bowel movement.  Objective: Vital signs in last 24 hours: Temp:  [96.1 F (35.6 C)-98.2 F (36.8 C)] 98.2 F (36.8 C) (02/15 0628) Pulse Rate:  [72-107] 80 (02/15 0628) Resp:  [8-16] 16 (02/15 0628) BP: (127-159)/(68-95) 128/77 mmHg (02/15 0628) SpO2:  [95 %-100 %] 98 % (02/15 0628) Weight:  [191 lb (86.637 kg)] 191 lb (86.637 kg) (02/15 0500) Last BM Date: 04/14/12  Intake/Output from previous day: 02/14 0701 - 02/15 0700 In: 4590 [I.V.:4090; IV Piggyback:500] Out: 1725 [Urine:1700; Blood:25] Intake/Output this shift:    General appearance: alert, cooperative and no distress GI: generally soft with appropriate mild incisional tenderness Incision/Wound: dressings clean and dry  Lab Results:   Recent Labs  04/14/12 1315 04/15/12 0440  WBC 11.1* 11.2*  HGB 13.5 12.0  HCT 39.6 35.9*  PLT 225 211   BMET  Recent Labs  04/14/12 1315 04/15/12 0440  NA  --  137  K  --  3.6  CL  --  107  CO2  --  23  GLUCOSE  --  123*  BUN  --  5*  CREATININE 0.71 0.73  CALCIUM  --  8.1*     Studies/Results: No results found.  Anti-infectives: Anti-infectives   Start     Dose/Rate Route Frequency Ordered Stop   04/14/12 0515  cefOXitin (MEFOXIN) 2 g in dextrose 5 % 50 mL IVPB     2 g 100 mL/hr over 30 Minutes Intravenous On call to O.R. 04/14/12 0515 04/14/12 0726      Assessment/Plan: s/p Procedure(s): Laparoscopic Repair of  Ventral Hernia Stable postop. Not quite ready for discharge if she is still on parenteral medications and nausea just resolving. Continue current management. Possible home tomorrow.   LOS: 1 day    Xiong Haidar T 04/15/2012

## 2012-04-15 NOTE — Progress Notes (Signed)
UR completed 

## 2012-04-16 LAB — CBC
HCT: 33.4 % — ABNORMAL LOW (ref 36.0–46.0)
Hemoglobin: 11.2 g/dL — ABNORMAL LOW (ref 12.0–15.0)
RBC: 3.75 MIL/uL — ABNORMAL LOW (ref 3.87–5.11)
RDW: 12.7 % (ref 11.5–15.5)
WBC: 7.2 10*3/uL (ref 4.0–10.5)

## 2012-04-16 LAB — BASIC METABOLIC PANEL
BUN: 9 mg/dL (ref 6–23)
Chloride: 106 mEq/L (ref 96–112)
GFR calc Af Amer: 89 mL/min — ABNORMAL LOW (ref >90)
Potassium: 3.7 mEq/L (ref 3.5–5.1)

## 2012-04-16 MED ORDER — OXYCODONE-ACETAMINOPHEN 5-325 MG PO TABS: 1.0000 | ORAL_TABLET | ORAL | Status: DC | PRN

## 2012-04-16 NOTE — Progress Notes (Signed)
Assessment unchanged. Pt verbalized understanding of dc instructions. Introduced to My Chart and able to verbalized to nurse how to sign up at home. Script x1 given as provided by MD. Discharged via wc to front entrance to meet husband and awaiting vehicle to carry home. Accompanied by NT.

## 2012-04-16 NOTE — Discharge Summary (Signed)
   Patient ID: RENEE ERB [id number] 52 y.o. 14-Jun-1960  04/14/2012  Discharge date and time: 04/16/2012   Admitting Physician: Luretha Murphy  Discharge Physician: Glenna Fellows T  Admission Diagnoses: ventral hernia  Discharge Diagnoses: Same  Operations: Procedure(s): Laparoscopic Repair of  Ventral Hernia  Admission Condition: good  Discharged Condition: good  Indication for Admission: patient is a 52 year old female with a symptomatic recurrent ventral hernia admitted electively by Dr. Daphine Deutscher for repair.  Hospital Course: on the morning of admission the patient underwent laparoscopy with delineation of the hernia and an open primary repair. She tolerated the procedure well. On the first postoperative day she had some expected discomfort and some nausea but was stable. On the second postop day she had had some gas cramps but was passing gas and this was much improved. She is ambulatory. Tolerating her diet. Abdomen is soft and nontender incisions are healing well without complication. She is ready for discharge.   Disposition: Home  Patient Instructions:    Medication List    TAKE these medications       albuterol 108 (90 BASE) MCG/ACT inhaler  Commonly known as:  PROVENTIL HFA  2 puffs every 4 hours as needed for wheezing     ALPRAZolam 0.5 MG tablet  Commonly known as:  XANAX  Take 1/2 to 1 tablet by mouth three times daily as needed for panic attacks     B-complex with vitamin C tablet  Take 1 tablet by mouth daily.     BIOTIN PO  Take 1 capsule by mouth daily.     fexofenadine 180 MG tablet  Commonly known as:  ALLEGRA  Take 180 mg by mouth daily.     Fluticasone-Salmeterol 100-50 MCG/DOSE Aepb  Commonly known as:  ADVAIR DISKUS  Inhale 1 puff into the lungs 2 (two) times daily.     ibuprofen 200 MG tablet  Commonly known as:  ADVIL,MOTRIN  Take 400 mg by mouth every 6 (six) hours as needed. Pain     MAGNESIUM PO  Take 1 tablet by mouth  daily.     Melatonin 3 MG Tabs  Take 1 tablet by mouth at bedtime.     MINIVELLE 0.075 MG/24HR  Generic drug:  estradiol  Place 1 patch onto the skin every 3 (three) days. Apply 1 patch every day     montelukast 10 MG tablet  Commonly known as:  SINGULAIR  Take 1 tablet (10 mg total) by mouth at bedtime.     nabumetone 750 MG tablet  Commonly known as:  RELAFEN  Take 750 mg by mouth 2 (two) times daily. As needed for arthritis pain     omeprazole 20 MG capsule  Commonly known as:  PRILOSEC  Take 1 capsule (20 mg total) by mouth 2 (two) times daily.     oxyCODONE-acetaminophen 5-325 MG per tablet  Commonly known as:  PERCOCET/ROXICET  Take 1-2 tablets by mouth every 4 (four) hours as needed.     Vitamin D3 1000 UNITS Caps  Take 1 capsule by mouth daily.        Activity: no heavy lifting for 6 weeks Diet: regular diet Wound Care: none needed  Follow-up:  With Dr. Daphine Deutscher in 3 weeks.  Signed: Mariella Saa MD, FACS  04/16/2012, 7:59 AM

## 2012-04-17 ENCOUNTER — Encounter (HOSPITAL_COMMUNITY): Payer: Self-pay | Admitting: Surgery

## 2012-05-01 ENCOUNTER — Ambulatory Visit (INDEPENDENT_AMBULATORY_CARE_PROVIDER_SITE_OTHER): Payer: BC Managed Care – PPO | Admitting: Surgery

## 2012-05-01 ENCOUNTER — Encounter (INDEPENDENT_AMBULATORY_CARE_PROVIDER_SITE_OTHER): Payer: Self-pay | Admitting: Surgery

## 2012-05-01 ENCOUNTER — Encounter (INDEPENDENT_AMBULATORY_CARE_PROVIDER_SITE_OTHER): Payer: Self-pay

## 2012-05-01 ENCOUNTER — Telehealth: Payer: Self-pay | Admitting: Pulmonary Disease

## 2012-05-01 VITALS — BP 136/88 | HR 81 | Temp 97.2°F | Ht 64.0 in | Wt 195.0 lb

## 2012-05-01 DIAGNOSIS — Z9889 Other specified postprocedural states: Secondary | ICD-10-CM

## 2012-05-01 DIAGNOSIS — Z8719 Personal history of other diseases of the digestive system: Secondary | ICD-10-CM

## 2012-05-01 MED ORDER — LORAZEPAM 1 MG PO TABS: ORAL_TABLET | ORAL | Status: DC

## 2012-05-01 NOTE — Patient Instructions (Signed)
Thanks for your patience.  If you need further assistance after leaving the office, please call our office and speak with a [facility] nurse.  [phone].  If you want to leave a message for Dr. Martin, please call his office phone at [phone]. 

## 2012-05-01 NOTE — Telephone Encounter (Signed)
Called and spoke with pt and she stated that she is having increase in panic attacks.  Pt stated that she is not sure how to deal with these panic attacks.  She stated that she has the xanax but she is not able to take these like they are prescribed due to the side effects.  She stated that she has been on cymbalta but gained 50 lbs.  She has increase in stress at work.  Wanting SN recs for this.  Please advise. Thanks  Allergies  Allergen Reactions  . Progesterone     Stroke-like symptoms  . Codeine     REACTION: nausea,vomiting,dizziness  . Cymbalta (Duloxetine Hcl)     Pt states it caused weight gain

## 2012-05-01 NOTE — Progress Notes (Signed)
Jaime Chapman 52 y.o.  Body mass index is 33.46 kg/(m^2).  Patient Active Problem List  Diagnosis  . VITAMIN D DEFICIENCY  . DYSLIPIDEMIA  . SINUSITIS, ACUTE  . ALLERGIC RHINITIS  . ASTHMA  . GERD  . VENTRAL HERNIA-twice recurrent  . DEGENERATIVE DISC DISEASE  . FIBROMYALGIA  . SHORTNESS OF BREATH  . CHEST PAIN  . Physical exam, annual  . Panic anxiety syndrome  . Fatty liver disease, nonalcoholic  . Impaired fasting glucose  . Pulmonary nodules  . Overweight    Allergies  Allergen Reactions  . Progesterone     Stroke-like symptoms  . Codeine     REACTION: nausea,vomiting,dizziness  . Cymbalta (Duloxetine Hcl)     Pt states it caused weight gain    Past Surgical History  Procedure Laterality Date  . Ventral hernia repair  09/2006    Dr. Janee Morn  . Laparoscopy  2002  . Endometrial ablation  2009  . Laparoscopic ventral hernia repair w/ mesh  08/2009    Dr. Janee Morn, [facility]  . Cesarean section  1982, 1985    x 2   . Tubal ligation    . Ventral hernia repair N/A 04/14/2012    Procedure: Laparoscopic Repair of  Ventral Hernia;  Surgeon: Valarie Merino, MD;  Location: [facility];  Service: General;  Laterality: N/A;  Repair of Recurrent Ventral Hernia  . Hernia repair  04/14/12    ventral hernia repair   [name],SCOTT M, MD No diagnosis found.  Postop complex ventral hernia repair.  The incision has healed nicely. There is no evidence of a structural defect at the present time. Jaime Chapman feels good and feels like it is repaired. 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 Jaime Chapman's not ready return to work yet but I would get a return to work on March 17. I'll see her back in 2 months and routine followup. Impression doing well after repair of twice recurrent ventral hernia Matt B. Daphine Deutscher, MD, [facility] [phone] beeper [phone]  05/01/2012 2:04 PM

## 2012-05-01 NOTE — Telephone Encounter (Signed)
Per SN----if not taking the alprazolam due to not being able to "function" then we will need to change the medication to ativan 1 mg  #90  1/2 to 1 tablet po tid .  Pt [facility] that [facility] [address].  Per SN we can refer her to susan bond and i gave the pt the number to the office so she can call and schedule an appt.

## 2012-05-30 ENCOUNTER — Other Ambulatory Visit: Payer: Self-pay | Admitting: Pulmonary Disease

## 2012-06-02 ENCOUNTER — Ambulatory Visit (INDEPENDENT_AMBULATORY_CARE_PROVIDER_SITE_OTHER): Payer: 59 | Admitting: Licensed Clinical Social Worker

## 2012-06-02 DIAGNOSIS — F4323 Adjustment disorder with mixed anxiety and depressed mood: Secondary | ICD-10-CM

## 2012-06-05 ENCOUNTER — Encounter (INDEPENDENT_AMBULATORY_CARE_PROVIDER_SITE_OTHER): Payer: Self-pay

## 2012-06-09 ENCOUNTER — Ambulatory Visit (INDEPENDENT_AMBULATORY_CARE_PROVIDER_SITE_OTHER): Payer: 59 | Admitting: Licensed Clinical Social Worker

## 2012-06-09 DIAGNOSIS — F4323 Adjustment disorder with mixed anxiety and depressed mood: Secondary | ICD-10-CM

## 2012-06-22 ENCOUNTER — Telehealth: Payer: Self-pay | Admitting: Pulmonary Disease

## 2012-06-23 ENCOUNTER — Other Ambulatory Visit (INDEPENDENT_AMBULATORY_CARE_PROVIDER_SITE_OTHER): Payer: 59

## 2012-06-23 ENCOUNTER — Ambulatory Visit (INDEPENDENT_AMBULATORY_CARE_PROVIDER_SITE_OTHER): Payer: 59 | Admitting: Licensed Clinical Social Worker

## 2012-06-23 ENCOUNTER — Encounter: Payer: Self-pay | Admitting: Adult Health

## 2012-06-23 ENCOUNTER — Ambulatory Visit (INDEPENDENT_AMBULATORY_CARE_PROVIDER_SITE_OTHER): Payer: 59 | Admitting: Adult Health

## 2012-06-23 VITALS — BP 112/62 | HR 68 | Temp 98.7°F | Ht 64.0 in | Wt 198.2 lb

## 2012-06-23 DIAGNOSIS — K219 Gastro-esophageal reflux disease without esophagitis: Secondary | ICD-10-CM

## 2012-06-23 DIAGNOSIS — F4323 Adjustment disorder with mixed anxiety and depressed mood: Secondary | ICD-10-CM

## 2012-06-23 LAB — BASIC METABOLIC PANEL
BUN: 18 mg/dL (ref 6–23)
CO2: 28 mEq/L (ref 19–32)
Calcium: 9.3 mg/dL (ref 8.4–10.5)
Creatinine, Ser: 0.8 mg/dL (ref 0.4–1.2)
Glucose, Bld: 91 mg/dL (ref 70–99)

## 2012-06-23 LAB — CBC WITH DIFFERENTIAL/PLATELET
Eosinophils Relative: 1.8 % (ref 0.0–5.0)
HCT: 42.5 % (ref 36.0–46.0)
Hemoglobin: 14.7 g/dL (ref 12.0–15.0)
Lymphs Abs: 1.7 10*3/uL (ref 0.7–4.0)
Monocytes Relative: 7.1 % (ref 3.0–12.0)
Neutro Abs: 4.2 10*3/uL (ref 1.4–7.7)
RDW: 12.6 % (ref 11.5–14.6)

## 2012-06-23 LAB — LIPASE: Lipase: 23 U/L (ref 11.0–59.0)

## 2012-06-23 LAB — HEPATIC FUNCTION PANEL
Albumin: 4.4 g/dL (ref 3.5–5.2)
Total Protein: 7.3 g/dL (ref 6.0–8.3)

## 2012-06-23 NOTE — Telephone Encounter (Signed)
Pt called yesterday c/o pain under ribcage  and I scheduled an appt for her to see TP (scheduled this yesterday so will close this encounter as it was not needed after all. )Jaime Chapman

## 2012-06-23 NOTE — Patient Instructions (Signed)
May use Gas-X, with meals and as needed. Increase Prilosec to 20 mg twice daily before meals  Begin stool softener daily as needed. For constipation.  Begin probiotic, Align, daily. I will call with lab results. Follow up Dr. Kriste Basque as scheduled and as needed. Please contact office for sooner follow up if symptoms do not improve or worsen or seek emergency care

## 2012-06-23 NOTE — Progress Notes (Signed)
Subjective:    Patient ID: Jaime Chapman, female    DOB: 1960/10/09, 52 y.o.   MRN: [id number]  HPI 52 y/o WF  Followed for general medical purposes w/ hx of Asthma (ex-smoker quit 2006), Dyslipidiemia, GERD, DJD/ DDD/ FM, and Anxiety... she is also followed by DrDeveshwar for Rheum, DrMcLean for Cards, & DrThompson/MMartin for CCS...   06/23/2012 Acute OV  Complains of tenderness in ribs under the right breast x4 days. Sore to touch along the edge of the ribs and right upper abd quad.  Complains of gas and bloating. Has chronic constipation.  No fever, rash, n/v/d, or injury. No exertional chest pain.  No previous episodes.  Started physical therapy 4 days ago for low back strain.  Had abdominal hernia repair 04/14/12 .  No meds taken for this pain. Taking pain meds for her back .  Some heartburn /indigestion despite prilosec daily . She denies any bloody stools, fever, or urinary symptoms, chest pain, recent travel or antibiotic use Pain occurs all day. Does see some increase with food, but also other times when she has not eaten              Problem List:   ALLERGIC RHINITIS (ICD-477.9) - on ALLEGRA 180/d- states she needs it daily!  ASTHMA (ICD-493.90) >> on ADVAIR 100Bid, SINGULAIR 10mg /d, PROVENTIL HFA rescue, & MUCINEX 1-2 Bid... ABNORMAL CT CHEST (ICD=793.19) >>   ~  she had an asthma flair in Feb09 due to exposure w/ grandson in daycare rx'd w/ Levaquin and Pred and improved...  ~  CXR 8/12 showed prom epicardial fat pad, normal heart size, clear lungs... ~  PFTs 8/12 showed FVC=2.14 (64%), FEV1=1.65 (60%), %1sec=77, mid-flows= 48% predicted... ~  12/12: prob sudden panic attack & evaluated in [place]> CTAngio neg for PE, incidental 5mm nodule postRUL & 3mm nodule antLLL, bullous dis in sup seg RLL, & atx in bases;  We discussed protocol for f/u CXR & CT... ~  CT Chest 12/13 showed ==> stable nodules measuring postRUL & 4mm in RLL, no change, NAD; we will follow w/  CXR from here...  CHEST PAIN (ICD-786.50) - Cardiac eval 3/11 by DrMcLean for atyp CP> symptoms improved w/ PPI Rx. ~  EKG showed NSR, WNL... ~  2DECho showed normal LVF w/ EF= 60-65%, norm wall motion, gr 2 DD, valves normal. ~  Stress Echo was neg- no ischemia...  DYSLIPIDEMIA (ICD-272.4) - on diet + FISH OIL but unfortunately her weight is up to 230# ~  FLP 10/07 showed TChol 181, TG 117, HDL 35, LDL 123... rec for better diet & exercise program. ~  FLP 3/09 (wt=207#) showed TChol 201, TG 204, HDL 41, LDL 128... she prefers diet Rx. ~  FLP 10/10 (wt=221#) showed TChol 217, TG 225, HDL 39, LDL 150... offered med Rx. ~  FLP 8/12 (wt=230#) showed TChol 182, TG 146, HDL 39, LDL 114 ~  FLP 11/13 (wt=188#) showed TChol 198, TG 106, HDL 54, LDL 123   Borderline DM> on diet alone; prev FBS 101-127 & random BS in HPER 182; BS=114, A1c=5.9; REC> low carb diet, get wt down. ~  Labs 11/13 (wt=188#) showed BS=88  OBESITY (ICD-278.00) - she weighs ~230# and is 64" tall for a BMI= 40... we discussed diet & exercise program. ~  7/11:  weight remains 222# & she blames the Cymbalta for her lack of wt reduction. ~  8/12:  Weight = 230# & she is off the Cymbalta... ~  1/13:  Weight = 230# & we reviewed diet & exercise prescription. ~  11/13:  Weight = 188# on Optifast diet via [facility] in [place].  GERD (ICD-530.81) - on OMEPRAZOLE 20mg Bid w/ improvement;  we discussed the assoc of reflux to asthma... ~  EGD 9/12 by DrStark for reflux symptoms showed mild gastitis; rec to incr PPI to Bid... ~  UGISeries 6/13 by DrMMartin showed tiny HH but signif reflux seen, barium pill passed normally into the stomach w/o delay...  COLON POLYP >> she has mild constip, gas, bloating & rec to take Miralax & anti-gas meds as needed... ~  Colonoscopy 9/12 by DrStark showed normal colon x polyp in sigmoid area; path= hyperpl & f/u planned 57yrs...  FATTY LIVER DISEASE >> we have discussed the need for diet,  exercise, & wt reduction strategies... ~  Labs here 2009 showed SGOT=22, SGPT=26 ~  Labs here 2010 showed SGOT=23, SGPT=28 ~  Labs [place] 12/12 showed SGOT=54, SGPT=66... She is asked to get on diet & get wt down! ~  Labs here 11/13 showed SGOT=25, SGPT=49 with her weight down 42# to 188# today...  RECURRENT VENTRAL HERNIA >>  ~  Ventral hernia repair 2008 by DrThompson ==> hernia recurred... ~ s/p Lap ventral hernia repair w/ mesh 7/11 by DrThompson; she is c/o recurrent pain & f/u CT showed recurrent hernia; she had second opinion consult DrMMartin & he stressed wt reduction & offered lap band surg... DrStark agreed w/ thoughts for another opinion from [facility] in [place] when she is ready. ~  11/13:  She has lost 42# on Optifast diet thru [facility]; she will check back w/ DrMartin regarding ventral hernia surg...  DEGENERATIVE DISC DISEASE (ICD-722.6) - she is followed by DrDeveshwar for Rheum> notes reviewed: FM, fall w/ LBP, MRI w/ facet arthritis, CSpine disc dis w/ chronic pain;  On RELAFEN 750mg  Bid as needed... Also tried Lidoderm, PT, TENS, heat etc... ~  5/12:  She reports a fall in [place] w/ bruising in her side> saw Chiropractor, then DrDuda, then [employer]....  FIBROMYALGIA (ICD-729.1) - DrNeal has given her Ambien to help w/ her sleep pattern; She tried Cymbalta but blamed wt gain on this med; she has been referred to Integrative therapies in the past... Currently taking RELAFEN 750mg  bid prn... ~  10/13:  Note from DrDeveshwar reviewed> FM, DDD in Cspine & LSspine; symptoms improved w/ wt loss; still fatigued & stressed; hx of "sleep eating" after taking Ambien; they tried [facility] 15mg  Qhs...  VITAMIN D DEFICIENCY (ICD-268.9) - Vit D level here 3/09 was 13 and started on 50K weekly... pt indicates that DrDeveshwar has been following her Vit D level since then and switched her to 5000 u two times a day... ~  8/12:  There is no Vit D supplement listed on her med sheet... ~   11/13:  She is taking Vit D 1000u daily...  STRESS DISORDER & PANIC ATTACKS - she blames the closed MRI w/ the recurrence of her Panic attacks> Rx w/ ALPRAZOLAM 0.5mg  Tid as directed... ~  12/12: had another ?panic attack at work, taken to [place] w/ neg eval...  [facility] --- GYN= DrNeal & he does mammograms in his office & BMD as well...   Past Surgical History  Procedure Laterality Date  . Ventral hernia repair  09/2006    Dr. Janee Morn  . Laparoscopy  2002  . Endometrial ablation  2009  . Laparoscopic ventral hernia repair w/ mesh  08/2009    Dr. Janee Morn, [facility]  .  Cesarean section  1982, 1985    x 2   . Tubal ligation    . Ventral hernia repair N/A 04/14/2012    Procedure: Laparoscopic Repair of  Ventral Hernia;  Surgeon: Valarie Merino, MD;  Location: [facility];  Service: General;  Laterality: N/A;  Repair of Recurrent Ventral Hernia  . Hernia repair  04/14/12    ventral hernia repair    Outpatient Encounter Prescriptions as of 06/23/2012  Medication Sig Dispense Refill  . B Complex-C (B-COMPLEX WITH VITAMIN C) tablet Take 1 tablet by mouth daily.      [name] BIOTIN PO Take 1 capsule by mouth daily.       . Cholecalciferol (VITAMIN D3) 1000 UNITS CAPS Take 1 capsule by mouth daily.       . fexofenadine (ALLEGRA) 180 MG tablet Take 180 mg by mouth daily.       . Fluticasone-Salmeterol (ADVAIR DISKUS) 100-50 MCG/DOSE AEPB Inhale 1 puff into the lungs 2 (two) times daily.  60 each  11  . ibuprofen (ADVIL,MOTRIN) 200 MG tablet Take 400 mg by mouth every 6 (six) hours as needed. Pain      . MAGNESIUM PO Take 1 tablet by mouth daily.       . Melatonin 3 MG TABS Take 1 tablet by mouth at bedtime.      [name] MINIVELLE 0.075 MG/24HR Place 1 patch onto the skin every 3 (three) days.       . montelukast (SINGULAIR) 10 MG tablet Take 1 tablet (10 mg total) by mouth at bedtime.  30 tablet  11  . nabumetone (RELAFEN) 750 MG tablet Take 750 mg by mouth 2 (two) times daily. As needed for arthritis pain       . omeprazole (PRILOSEC) 20 MG capsule Take 1 capsule (20 mg total) by mouth 2 (two) times daily.  60 capsule  11  . oxyCODONE-acetaminophen (PERCOCET/ROXICET) 5-325 MG per tablet Take 1-2 tablets by mouth every 4 (four) hours as needed.  40 tablet  0  . PROVENTIL HFA 108 (90 BASE) MCG/ACT inhaler INHALE 2 PUFFS EVERY 4 HOURS AS NEEDED FOR WHEEZING  6.7 each  4  . LORazepam (ATIVAN) 1 MG tablet Take 1/2 to 1 tablet by mouth three times daily as needed for anxiety  90 tablet  1   No facility-administered encounter medications on file as of 06/23/2012.    Allergies  Allergen Reactions  . Progesterone     Stroke-like symptoms  . Codeine     REACTION: nausea,vomiting,dizziness  . Cymbalta (Duloxetine Hcl)     Pt states it caused weight gain    Current Medications, Allergies, Past Medical History, Past Surgical History, Family History, and Social History were reviewed in [employer] record.    Review of Systems         Constitutional:   No  weight loss, night sweats,  Fevers, chills, fatigue, or  lassitude.  HEENT:   No headaches,  Difficulty swallowing,  Tooth/dental problems, or  Sore throat,                No sneezing, itching, ear ache, nasal congestion, post nasal drip,   CV:  No chest pain,  Orthopnea, PND, swelling in lower extremities, anasarca, dizziness, palpitations, syncope.   GI +heartburn, indigestion, abdominal pain, NO  nausea, vomiting, diarrhea, change in bowel habits, loss of appetite, bloody stools.   Resp: No shortness of breath with exertion or at rest.  No excess mucus, no  productive cough,  No non-productive cough,  No coughing up of blood.  No change in color of mucus.  No wheezing.  No chest wall deformity  Skin: no rash or lesions.  GU: no dysuria, change in color of urine, no urgency or frequency.  No flank pain, no hematuria   MS:  No joint pain or swelling.  No decreased range of motion.  No back pain.  Psych:  No change in mood  or affect. No depression or anxiety.  No memory loss.       Objective:   Physical Exam    WD, Overweight, 52 y/o WF in NAD... GENERAL:  Alert & oriented; pleasant & cooperative... HEENT:  [place]/AT,  EACs-clear, TMs-wnl, NOSE-clear, THROAT-clear & wnl. NECK:  Supple w/ full ROM; no JVD; normal carotid impulses w/o bruits; no thyromegaly or nodules palpated; no lymphadenopathy. CHEST:  Clear to P & A; without wheezes/ rales/ or rhonchi. HEART:  Regular Rhythm; without murmurs/ rubs/ or gallops. ABDOMEN:  Obese, soft & mild tender in RUQ ; normal bowel sounds; no organomegaly or masses detected. No guarding or rebound  EXT: without deformities or arthritic changes; no varicose veins/ +venous insuffic/ tr edema. NEURO:    no focal neuro deficits;    DERM:  No lesions noted; no rash etc...    Assessment & Plan:

## 2012-06-23 NOTE — Assessment & Plan Note (Signed)
GERD Flare with ? IBS/Constipation  Check labs to r/o gallbladder dz.   Plan  May use Gas-X, with meals and as needed. Increase Prilosec to 20 mg twice daily before meals  Begin stool softener daily as needed. For constipation.  Begin probiotic, Align, daily. I will call with lab results. Follow up Dr. Kriste Basque as scheduled and as needed. Please contact office for sooner follow up if symptoms do not improve or worsen or seek emergency care

## 2012-06-27 NOTE — Progress Notes (Signed)
Quick Note:  LMOM TCB x1. ______ 

## 2012-06-27 NOTE — Progress Notes (Signed)
Quick Note:  Patient returned call. Advised of lab results / recs as stated by TP. Pt verbalized understanding and denied any questions. ______ 

## 2012-06-30 ENCOUNTER — Ambulatory Visit (INDEPENDENT_AMBULATORY_CARE_PROVIDER_SITE_OTHER): Payer: 59 | Admitting: Licensed Clinical Social Worker

## 2012-06-30 DIAGNOSIS — F411 Generalized anxiety disorder: Secondary | ICD-10-CM

## 2012-06-30 DIAGNOSIS — F331 Major depressive disorder, recurrent, moderate: Secondary | ICD-10-CM

## 2012-07-13 ENCOUNTER — Ambulatory Visit (INDEPENDENT_AMBULATORY_CARE_PROVIDER_SITE_OTHER): Payer: 59 | Admitting: Surgery

## 2012-07-13 VITALS — BP 136/82 | HR 74 | Temp 97.8°F | Resp 18 | Ht 64.0 in | Wt 197.0 lb

## 2012-07-13 DIAGNOSIS — Z8719 Personal history of other diseases of the digestive system: Secondary | ICD-10-CM

## 2012-07-13 DIAGNOSIS — Z9889 Other specified postprocedural states: Secondary | ICD-10-CM

## 2012-07-13 NOTE — Progress Notes (Signed)
Jenipher Havel Riffel 52 y.o.  Body mass index is 33.8 kg/(m^2).  Patient Active Problem List   Diagnosis Date Noted  . H/O ventral hernia repair 05/01/2012  . Pulmonary nodules 01/26/2012  . Overweight 01/26/2012  . Impaired fasting glucose 03/04/2011  . Fatty liver disease, nonalcoholic 10/28/2010  . Physical exam, annual 10/21/2010  . Panic anxiety syndrome 10/21/2010  . VENTRAL HERNIA-twice recurrent 09/08/2009  . SHORTNESS OF BREATH 05/08/2009  . CHEST PAIN 04/30/2009  . VITAMIN D DEFICIENCY 12/27/2008  . SINUSITIS, ACUTE 07/16/2008  . DYSLIPIDEMIA 05/22/2007  . ASTHMA 05/22/2007  . DEGENERATIVE DISC DISEASE 05/22/2007  . FIBROMYALGIA 05/22/2007  . ALLERGIC RHINITIS 04/14/2007  . GERD 04/14/2007    Allergies  Allergen Reactions  . Progesterone     Stroke-like symptoms  . Codeine     REACTION: nausea,vomiting,dizziness  . Cymbalta (Duloxetine Hcl)     Pt states it caused weight gain    Past Surgical History  Procedure Laterality Date  . Ventral hernia repair  09/2006    Dr. Janee Morn  . Laparoscopy  2002  . Endometrial ablation  2009  . Laparoscopic ventral hernia repair w/ mesh  08/2009    Dr. Janee Morn, [facility]  . Cesarean section  1982, 1985    x 2   . Tubal ligation    . Ventral hernia repair N/A 04/14/2012    Procedure: Laparoscopic Repair of  Ventral Hernia;  Surgeon: Valarie Merino, MD;  Location: [facility];  Service: General;  Laterality: N/A;  Repair of Recurrent Ventral Hernia  . Hernia repair  04/14/12    ventral hernia repair   [name],SCOTT M, MD No diagnosis found.  Incision has healed OK.  Repair is intact.  Having back pain from old fall in bathtub.  Discussed continuing efforts at weight loss.  Return prn Matt B. Daphine Deutscher, MD, [facility] [phone] beeper [phone]  07/13/2012 10:47 AM

## 2012-07-21 ENCOUNTER — Ambulatory Visit: Payer: 59 | Admitting: Licensed Clinical Social Worker

## 2012-08-16 IMAGING — CT CT ABD-PELV W/ CM
3 of 5 series · 13 of 36 positions shown, 19 images · IV contrast (READICAT/WATER & [ID] OMNI 300)
Comparison: None

CLINICAL DATA: History of laparoscopic incisional hernia repair

CT ABDOMEN AND PELVIS WITH CONTRAST
TECHNIQUE: Multidetector CT imaging of the abdomen and pelvis was
performed following the standard protocol during bolus
administration of intravenous contrast.
Contrast: 100 ml of omni 300

[Series 3: routine abdomen · axial · 0.78mm/px · z∈[-392,-92]mm · 6 of 84 slices shown, 11 images]
[im 12/84  soft-tissue]
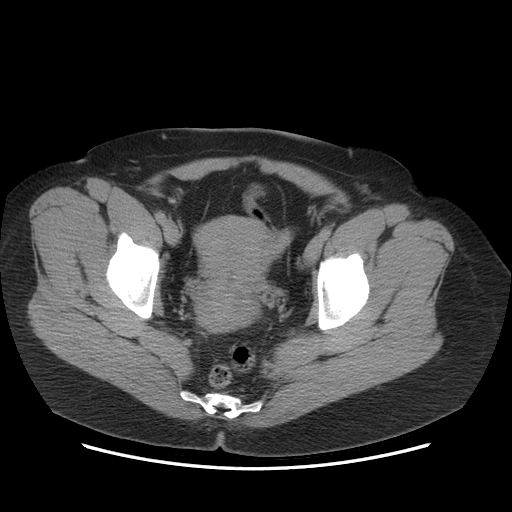
[im 12/84  bone]
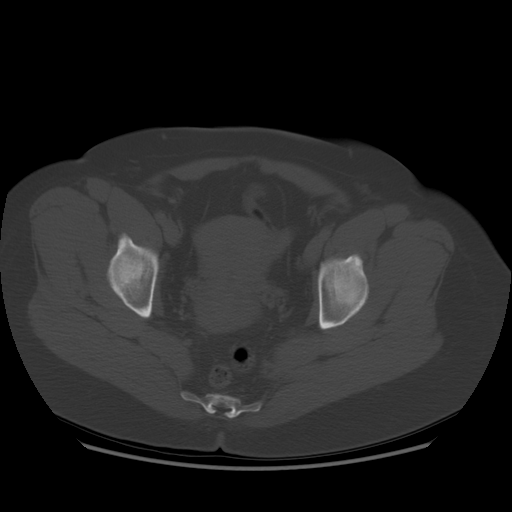
[im 24/84  soft-tissue]
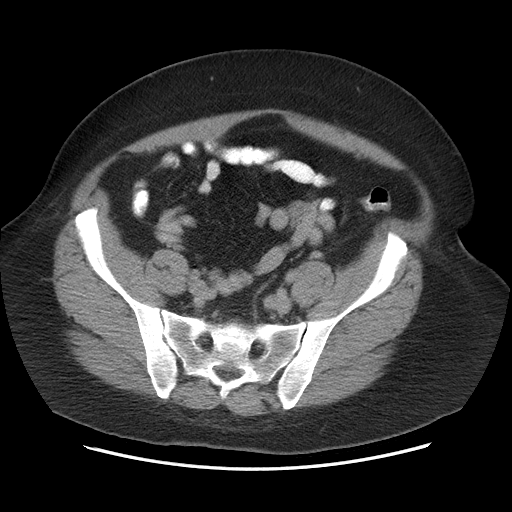
[im 36/84  soft-tissue]
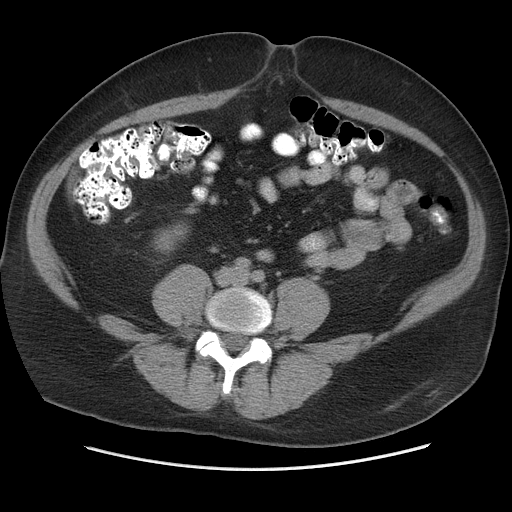
[im 36/84  lung]
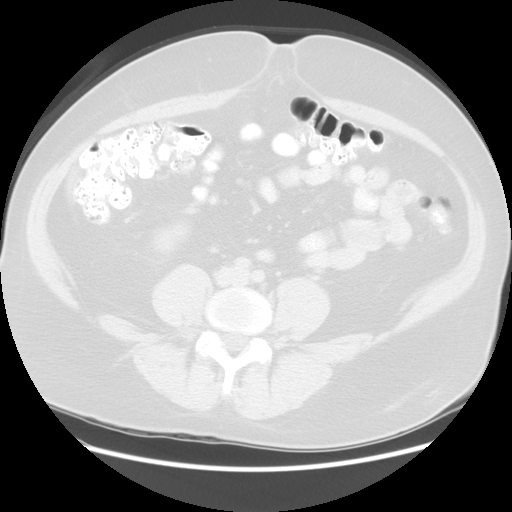
[im 48/84  soft-tissue]
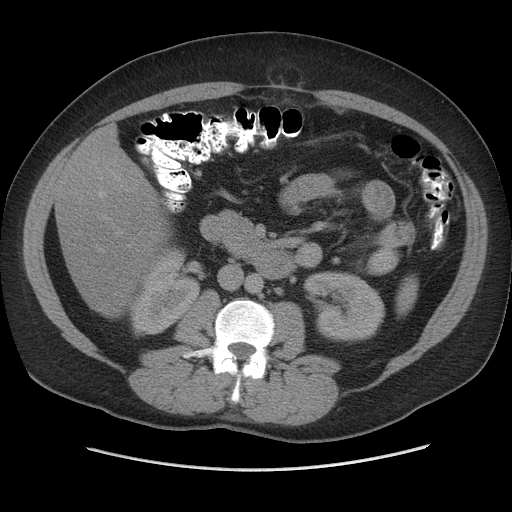
[im 48/84  lung]
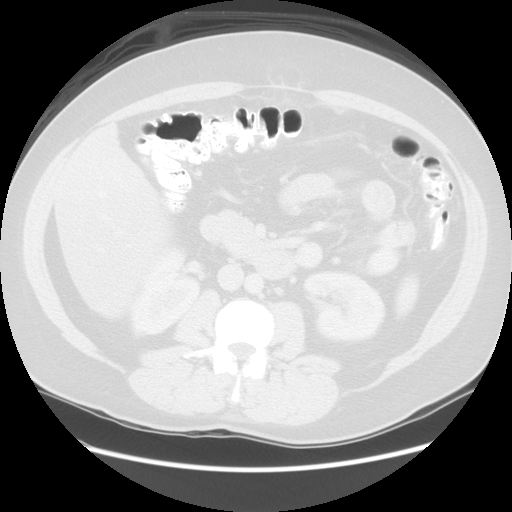
[im 60/84  soft-tissue]
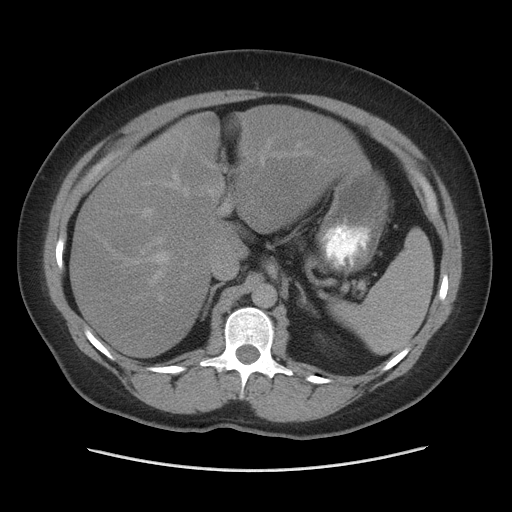
[im 60/84  lung]
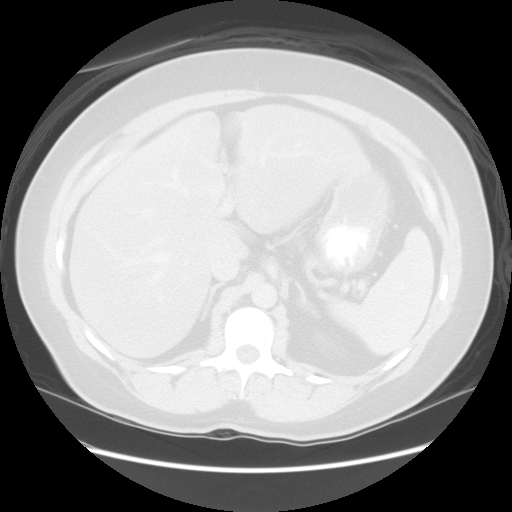
[im 72/84  soft-tissue]
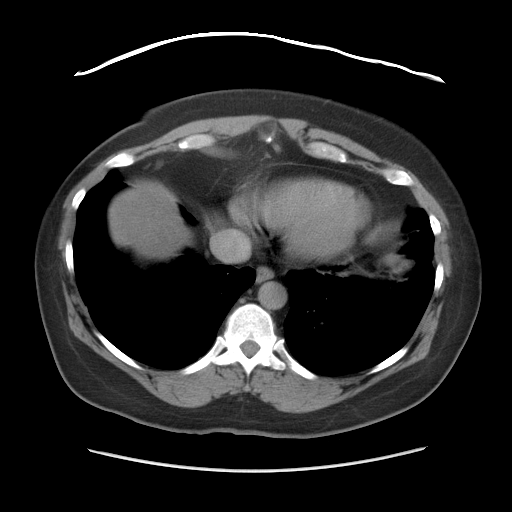
[im 72/84  lung]
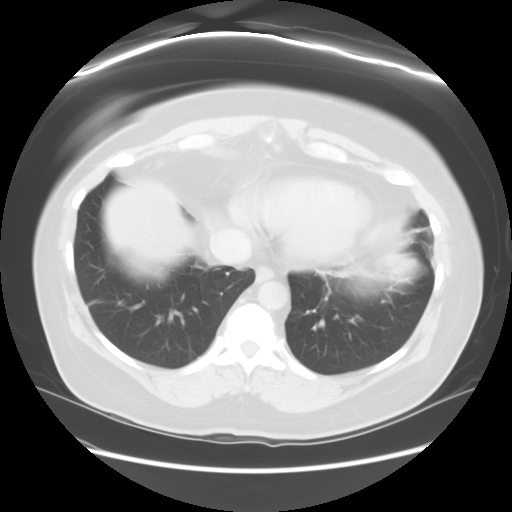

[Series 601: coronal body · coronal · 0.91mm/px · 1 of 137 slices shown, 2 images]
[im 46/137  soft-tissue]
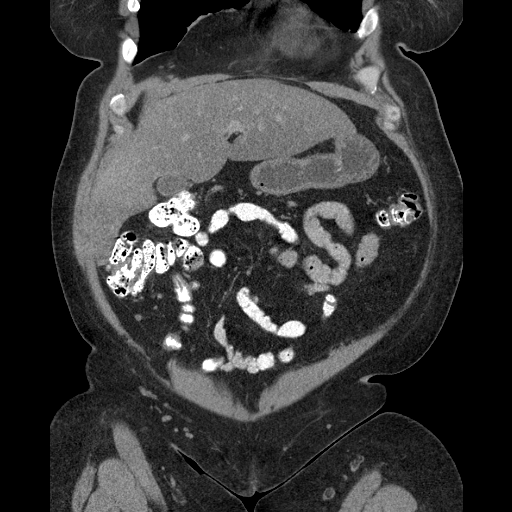
[im 46/137  bone]
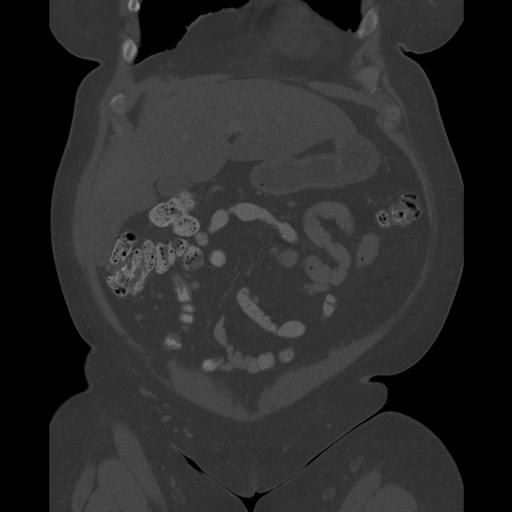

[Series 602: sagittal body · sagittal · 0.91mm/px · 6 of 161 slices shown]
[im 11/161  soft-tissue]
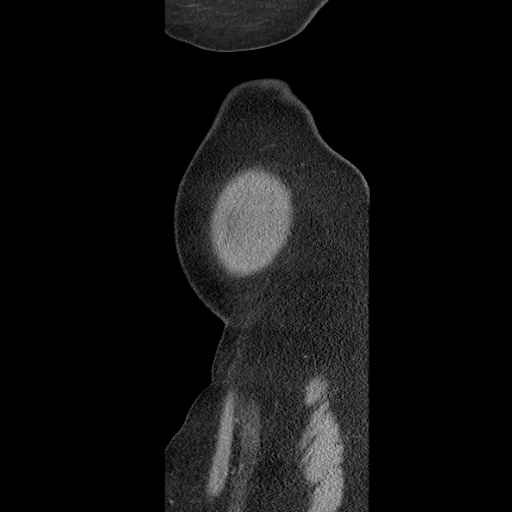
[im 31/161  soft-tissue]
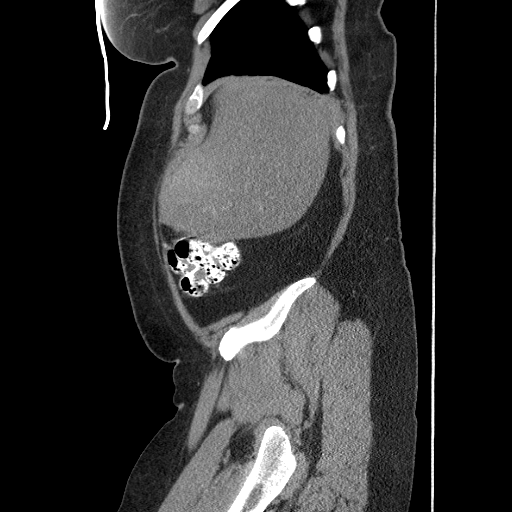
[im 51/161  soft-tissue]
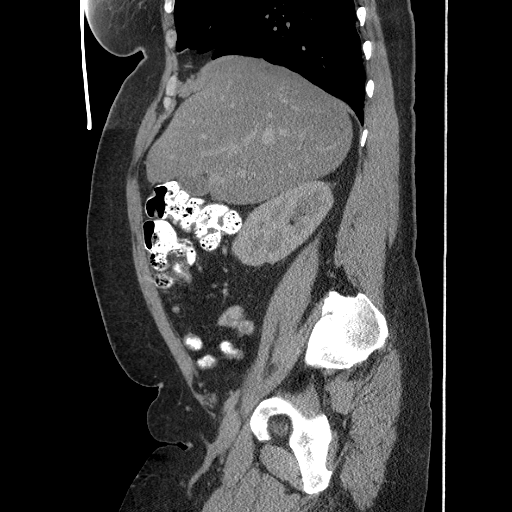
[im 71/161  soft-tissue]
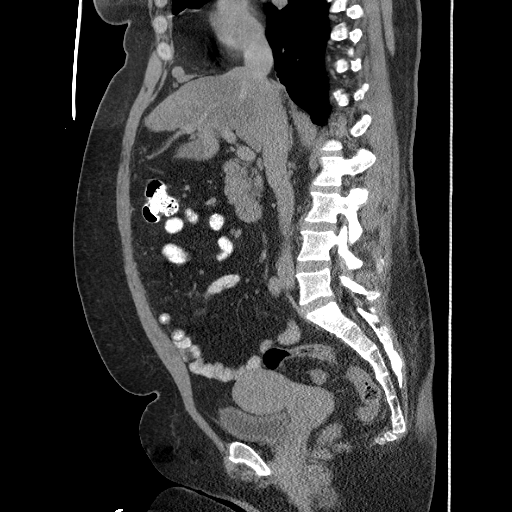
[im 91/161  soft-tissue]
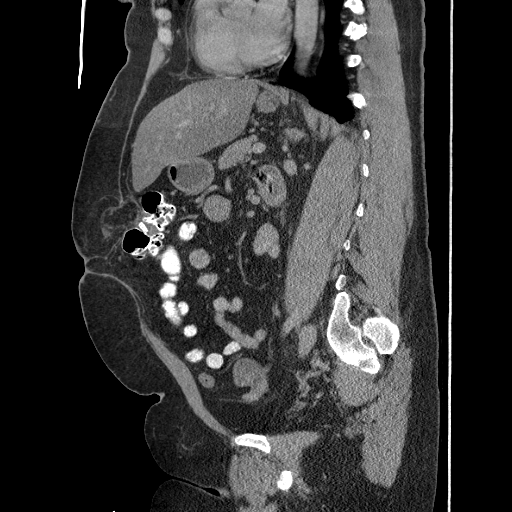
[im 111/161  soft-tissue]
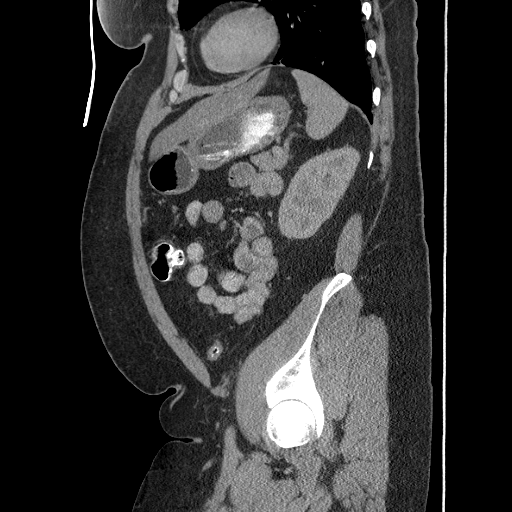

[13 of 36 positions shown; findings below may reference images not displayed]

FINDINGS: The lung bases appear clear.

No pericardial or pleural effusion.

There is mild diffuse fatty infiltration of the liver.

There is no focal liver abnormality noted.

Focal fatty sparing is identified adjacent to the falciform
ligament within the medial segment of the left hepatic lobe, image
28.

The gallbladder is normal.

The spleen appears normal.  The adrenal glands are normal.

The pancreas is unremarkable.  The kidneys both appear normal.

There are no enlarged lymph nodes within the upper abdomen or the
pelvis.  No inguinal adenopathy noted.

There is a supraumbilical ventral abdominal wall hernia which
contains fat only.  This measures 3.2 x 3.4 x t 2.5 cm.

There is a umbilical hernia present which contains fat only.  This
measures 3.8 x 3.6 x 3.2 cm.

No free fluid or fluid collections noted within the abdomen or
pelvis.

The uterus and adnexal structures appear normal.

The urinary bladder appears normal.

No enlarged pelvic or inguinal lymph nodes identified.

Review of the visualized osseous structures is unremarkable.
IMPRESSION: 1.  No acute findings identified within the abdomen or pelvis.
2.  Ventral abdominal wall hernias as above.
3.  Fatty infiltration of liver.

## 2012-10-17 ENCOUNTER — Other Ambulatory Visit: Payer: Self-pay | Admitting: Obstetrics & Gynecology

## 2012-10-17 DIAGNOSIS — N644 Mastodynia: Secondary | ICD-10-CM

## 2012-10-17 DIAGNOSIS — R922 Inconclusive mammogram: Secondary | ICD-10-CM

## 2012-11-01 ENCOUNTER — Ambulatory Visit
Admission: RE | Admit: 2012-11-01 | Discharge: 2012-11-01 | Disposition: A | Discharge status: Home / Self Care | Payer: 59 | Source: Ambulatory Visit | Attending: Obstetrics & Gynecology | Admitting: Obstetrics & Gynecology

## 2012-11-01 ENCOUNTER — Other Ambulatory Visit: Payer: Self-pay | Admitting: Obstetrics & Gynecology

## 2012-11-01 DIAGNOSIS — N644 Mastodynia: Secondary | ICD-10-CM

## 2012-11-01 DIAGNOSIS — R922 Inconclusive mammogram: Secondary | ICD-10-CM

## 2012-11-01 DIAGNOSIS — R9230 Dense breasts, unspecified: Secondary | ICD-10-CM

## 2012-11-06 ENCOUNTER — Other Ambulatory Visit: Payer: Self-pay | Admitting: Orthopaedic Surgery

## 2012-11-06 DIAGNOSIS — M542 Cervicalgia: Secondary | ICD-10-CM

## 2012-11-15 ENCOUNTER — Ambulatory Visit
Admission: RE | Admit: 2012-11-15 | Discharge: 2012-11-15 | Disposition: A | Discharge status: Home / Self Care | Payer: 59 | Source: Ambulatory Visit | Attending: Orthopaedic Surgery | Admitting: Orthopaedic Surgery

## 2012-11-15 DIAGNOSIS — M542 Cervicalgia: Secondary | ICD-10-CM

## 2012-12-22 ENCOUNTER — Ambulatory Visit (INDEPENDENT_AMBULATORY_CARE_PROVIDER_SITE_OTHER): Payer: 59 | Admitting: Adult Health

## 2012-12-22 ENCOUNTER — Encounter: Payer: Self-pay | Admitting: Adult Health

## 2012-12-22 VITALS — BP 122/84 | HR 72 | Temp 98.5°F | Ht 64.0 in | Wt 200.2 lb

## 2012-12-22 DIAGNOSIS — J45909 Unspecified asthma, uncomplicated: Secondary | ICD-10-CM

## 2012-12-22 MED ORDER — PREDNISONE 10 MG PO TABS: ORAL_TABLET | ORAL | Status: DC

## 2012-12-22 MED ORDER — AMOXICILLIN-POT CLAVULANATE 875-125 MG PO TABS: 1.0000 | ORAL_TABLET | Freq: Two times a day (BID) | ORAL | Status: AC

## 2012-12-22 NOTE — Assessment & Plan Note (Signed)
Asthmatic Bronchitis   Plan  Augmentin 875mg  Twice daily  For 7 days w/ food.  Mucinex DM Twice daily  As needed  Cough/congestion  Fluids and rest  Prednisone taper over next week.  Please contact office for sooner follow up if symptoms do not improve or worsen or seek emergency care

## 2012-12-22 NOTE — Progress Notes (Signed)
Subjective:    Patient ID: Jaime Chapman, female    DOB: 29-Jul-1960, 52 y.o.   MRN: [id number]  HPI 52 y/o WF  Followed for general medical purposes w/ hx of Asthma (ex-smoker quit 2006), Dyslipidiemia, GERD, DJD/ DDD/ FM, and Anxiety... she is also followed by DrDeveshwar for Rheum, DrMcLean for Cards, & DrThompson/MMartin for CCS...   12/22/2012 Acute OV    Complains of chest tightness, prod cough with green mucus, increased SOB, wheezing, sinus congestion, PND, some chills/low grade temp x3days.  taking mucines dm.  denies nausea, vomiting, hemoptysis, edema. No wt loss.  Wheezing on/off for last few days.  No recent travel or abx use.              Problem List:   ALLERGIC RHINITIS (ICD-477.9) - on ALLEGRA 180/d- states she needs it daily!  ASTHMA (ICD-493.90) >> on ADVAIR 100Bid, SINGULAIR 10mg /d, PROVENTIL HFA rescue, & MUCINEX 1-2 Bid... ABNORMAL CT CHEST (ICD=793.19) >>   ~  she had an asthma flair in Feb09 due to exposure w/ grandson in daycare rx'd w/ Levaquin and Pred and improved...  ~  CXR 8/12 showed prom epicardial fat pad, normal heart size, clear lungs... ~  PFTs 8/12 showed FVC=2.14 (64%), FEV1=1.65 (60%), %1sec=77, mid-flows= 48% predicted... ~  12/12: prob sudden panic attack & evaluated in [place]> CTAngio neg for PE, incidental 5mm nodule postRUL & 3mm nodule antLLL, bullous dis in sup seg RLL, & atx in bases;  We discussed protocol for f/u CXR & CT... ~  CT Chest 12/13 showed ==> stable nodules measuring postRUL & 4mm in RLL, no change, NAD; we will follow w/ CXR from here...  CHEST PAIN (ICD-786.50) - Cardiac eval 3/11 by DrMcLean for atyp CP> symptoms improved w/ PPI Rx. ~  EKG showed NSR, WNL... ~  2DECho showed normal LVF w/ EF= 60-65%, norm wall motion, gr 2 DD, valves normal. ~  Stress Echo was neg- no ischemia...  DYSLIPIDEMIA (ICD-272.4) - on diet + FISH OIL but unfortunately her weight is up to 230# ~  FLP 10/07 showed TChol 181, TG 117, HDL  35, LDL 123... rec for better diet & exercise program. ~  FLP 3/09 (wt=207#) showed TChol 201, TG 204, HDL 41, LDL 128... she prefers diet Rx. ~  FLP 10/10 (wt=221#) showed TChol 217, TG 225, HDL 39, LDL 150... offered med Rx. ~  FLP 8/12 (wt=230#) showed TChol 182, TG 146, HDL 39, LDL 114 ~  FLP 11/13 (wt=188#) showed TChol 198, TG 106, HDL 54, LDL 123   Borderline DM> on diet alone; prev FBS 101-127 & random BS in HPER 182; BS=114, A1c=5.9; REC> low carb diet, get wt down. ~  Labs 11/13 (wt=188#) showed BS=88  OBESITY (ICD-278.00) - she weighs ~230# and is 64" tall for a BMI= 40... we discussed diet & exercise program. ~  7/11:  weight remains 222# & she blames the Cymbalta for her lack of wt reduction. ~  8/12:  Weight = 230# & she is off the Cymbalta... ~  1/13:  Weight = 230# & we reviewed diet & exercise prescription. ~  11/13:  Weight = 188# on Optifast diet via [facility] in [place].  GERD (ICD-530.81) - on OMEPRAZOLE 20mg Bid w/ improvement;  we discussed the assoc of reflux to asthma... ~  EGD 9/12 by DrStark for reflux symptoms showed mild gastitis; rec to incr PPI to Bid... ~  UGISeries 6/13 by DrMMartin showed tiny HH but signif reflux seen, barium  pill passed normally into the stomach w/o delay...  COLON POLYP >> she has mild constip, gas, bloating & rec to take Miralax & anti-gas meds as needed... ~  Colonoscopy 9/12 by DrStark showed normal colon x polyp in sigmoid area; path= hyperpl & f/u planned 43yrs...  FATTY LIVER DISEASE >> we have discussed the need for diet, exercise, & wt reduction strategies... ~  Labs here 2009 showed SGOT=22, SGPT=26 ~  Labs here 2010 showed SGOT=23, SGPT=28 ~  Labs [place] 12/12 showed SGOT=54, SGPT=66... She is asked to get on diet & get wt down! ~  Labs here 11/13 showed SGOT=25, SGPT=49 with her weight down 42# to 188# today...  RECURRENT VENTRAL HERNIA >>  ~  Ventral hernia repair 2008 by DrThompson ==> hernia recurred... ~ s/p  Lap ventral hernia repair w/ mesh 7/11 by DrThompson; she is c/o recurrent pain & f/u CT showed recurrent hernia; she had second opinion consult DrMMartin & he stressed wt reduction & offered lap band surg... DrStark agreed w/ thoughts for another opinion from [facility] in [place] when she is ready. ~  11/13:  She has lost 42# on Optifast diet thru [facility]; she will check back w/ DrMartin regarding ventral hernia surg...  DEGENERATIVE DISC DISEASE (ICD-722.6) - she is followed by DrDeveshwar for Rheum> notes reviewed: FM, fall w/ LBP, MRI w/ facet arthritis, CSpine disc dis w/ chronic pain;  On RELAFEN 750mg  Bid as needed... Also tried Lidoderm, PT, TENS, heat etc... ~  5/12:  She reports a fall in [place] w/ bruising in her side> saw Chiropractor, then DrDuda, then [employer]....  FIBROMYALGIA (ICD-729.1) - DrNeal has given her Ambien to help w/ her sleep pattern; She tried Cymbalta but blamed wt gain on this med; she has been referred to Integrative therapies in the past... Currently taking RELAFEN 750mg  bid prn... ~  10/13:  Note from DrDeveshwar reviewed> FM, DDD in [facility] & [facility]; symptoms improved w/ wt loss; still fatigued & stressed; hx of "sleep eating" after taking Ambien; they tried AMRIX 15mg  Qhs...  VITAMIN D DEFICIENCY (ICD-268.9) - Vit D level here 3/09 was 13 and started on 50K weekly... pt indicates that DrDeveshwar has been following her Vit D level since then and switched her to 5000 u two times a day... ~  8/12:  There is no Vit D supplement listed on her med sheet... ~  11/13:  She is taking Vit D 1000u daily...  STRESS DISORDER & PANIC ATTACKS - she blames the closed MRI w/ the recurrence of her Panic attacks> Rx w/ ALPRAZOLAM 0.5mg  Tid as directed... ~  12/12: had another ?panic attack at work, taken to [place] w/ neg eval...  [facility] --- GYN= DrNeal & he does mammograms in his office & BMD as well...   Past Surgical History  Procedure Laterality Date  .  Ventral hernia repair  09/2006    Dr. Janee Morn  . Laparoscopy  2002  . Endometrial ablation  2009  . Laparoscopic ventral hernia repair w/ mesh  08/2009    Dr. Janee Morn, [facility]  . Cesarean section  1982, 1985    x 2   . Tubal ligation    . Ventral hernia repair N/A 04/14/2012    Procedure: Laparoscopic Repair of  Ventral Hernia;  Surgeon: Valarie Merino, MD;  Location: [facility];  Service: General;  Laterality: N/A;  Repair of Recurrent Ventral Hernia  . Hernia repair  04/14/12    ventral hernia repair    Outpatient Encounter  Prescriptions as of 12/22/2012  Medication Sig Dispense Refill  . B Complex-C (B-COMPLEX WITH VITAMIN C) tablet Take 1 tablet by mouth daily.      . clobetasol ointment (TEMOVATE) 0.05 % As directed      . diclofenac (VOLTAREN) 75 MG EC tablet Take 75 mg by mouth 2 (two) times daily.      [name] escitalopram (LEXAPRO) 10 MG tablet Take 1.5 tablets by mouth daily.      . fexofenadine (ALLEGRA) 180 MG tablet Take 180 mg by mouth daily.       . Fluticasone-Salmeterol (ADVAIR DISKUS) 100-50 MCG/DOSE AEPB Inhale 1 puff into the lungs 2 (two) times daily.  60 each  11  . ibuprofen (ADVIL,MOTRIN) 200 MG tablet Take 400 mg by mouth every 6 (six) hours as needed. Pain      . LORazepam (ATIVAN) 1 MG tablet Take 1/2 to 1 tablet by mouth three times daily as needed for anxiety  90 tablet  1  . MAGNESIUM PO Take 1 tablet by mouth daily.       . Melatonin 3 MG TABS Take 1 tablet by mouth at bedtime.      [name] MINIVELLE 0.075 MG/24HR Place 1 patch onto the skin every 3 (three) days.       . montelukast (SINGULAIR) 10 MG tablet Take 1 tablet (10 mg total) by mouth at bedtime.  30 tablet  11  . omeprazole (PRILOSEC) 20 MG capsule Take 1 capsule (20 mg total) by mouth 2 (two) times daily.  60 capsule  11  . oxyCODONE-acetaminophen (PERCOCET/ROXICET) 5-325 MG per tablet Take 1-2 tablets by mouth every 4 (four) hours as needed.  40 tablet  0  . PROVENTIL HFA 108 (90 BASE) MCG/ACT inhaler INHALE 2  PUFFS EVERY 4 HOURS AS NEEDED FOR WHEEZING  6.7 each  4  . BIOTIN PO Take 1 capsule by mouth daily.       . Cholecalciferol (VITAMIN D3) 1000 UNITS CAPS Take 1 capsule by mouth daily.       [name] HYDROcodone-acetaminophen (NORCO/VICODIN) 5-325 MG per tablet Take 1 tablet by mouth every 8 (eight) hours as needed.      . nabumetone (RELAFEN) 750 MG tablet Take 750 mg by mouth 2 (two) times daily. As needed for arthritis pain       No facility-administered encounter medications on file as of 12/22/2012.    Allergies  Allergen Reactions  . Progesterone     Stroke-like symptoms  . Codeine     REACTION: nausea,vomiting,dizziness  . Cymbalta [Duloxetine Hcl]     Pt states it caused weight gain    Current Medications, Allergies, Past Medical History, Past Surgical History, Family History, and Social History were reviewed in [employer] record.    Review of Systems         Constitutional:   No  weight loss, night sweats,  Fevers, chills, fatigue, or  lassitude.  HEENT:   No headaches,  Difficulty swallowing,  Tooth/dental problems, or  Sore throat,                No sneezing, itching, ear ache,  +nasal congestion, post nasal drip,   CV:  No chest pain,  Orthopnea, PND, swelling in lower extremities, anasarca, dizziness, palpitations, syncope.   GI +heartburn, indigestion, abdominal pain, NO  nausea, vomiting, diarrhea, change in bowel habits, loss of appetite, bloody stools.   Resp:  No chest wall deformity  Skin: no rash or lesions.  GU: no dysuria, change in color of urine, no urgency or frequency.  No flank pain, no hematuria   MS:  No joint pain or swelling.  No decreased range of motion.  No back pain.  Psych:  No change in mood or affect. No depression or anxiety.  No memory loss.       Objective:   Physical Exam    WD, Overweight, 52 y/o WF in NAD... GENERAL:  Alert & oriented; pleasant & cooperative... HEENT:  [place]/AT,  EACs-clear, TMs-wnl,  NOSE-clear, THROAT-clear & wnl. NECK:  Supple w/ full ROM; no JVD; normal carotid impulses w/o bruits; no thyromegaly or nodules palpated; no lymphadenopathy. CHEST:  Clear to P & A; without wheezes/ rales/ or rhonchi. HEART:  Regular Rhythm; without murmurs/ rubs/ or gallops. ABDOMEN:  Obese, soft & mild tender in RUQ ; normal bowel sounds; no organomegaly or masses detected. No guarding or rebound  EXT: without deformities or arthritic changes; no varicose veins/ +venous insuffic/ tr edema. NEURO:    no focal neuro deficits;    DERM:  No lesions noted; no rash etc...    Assessment & Plan:

## 2012-12-22 NOTE — Patient Instructions (Addendum)
Augmentin 875mg  Twice daily  For 7 days w/ food.  Mucinex DM Twice daily  As needed  Cough/congestion  Fluids and rest   Prednisone taper over next week.  Please contact office for sooner follow up if symptoms do not improve or worsen or seek emergency care

## 2013-03-07 ENCOUNTER — Ambulatory Visit: Payer: 59 | Admitting: Pulmonary Disease

## 2013-04-15 ENCOUNTER — Emergency Department (HOSPITAL_COMMUNITY)
Admission: EM | Admit: 2013-04-15 | Discharge: 2013-04-15 | Disposition: A | Discharge status: Home / Self Care | Payer: 59 | Source: Home / Self Care | Attending: Family Medicine | Admitting: Family Medicine

## 2013-04-15 ENCOUNTER — Encounter (HOSPITAL_COMMUNITY): Payer: Self-pay | Admitting: Emergency Medicine

## 2013-04-15 DIAGNOSIS — B028 Zoster with other complications: Secondary | ICD-10-CM

## 2013-04-15 DIAGNOSIS — B029 Zoster without complications: Secondary | ICD-10-CM

## 2013-04-15 DIAGNOSIS — L308 Other specified dermatitis: Principal | ICD-10-CM

## 2013-04-15 HISTORY — DX: Depression, unspecified: F32.A

## 2013-04-15 HISTORY — DX: Major depressive disorder, single episode, unspecified: F32.9

## 2013-04-15 MED ORDER — CAPSAICIN 0.075 % EX CREA: 1.0000 "application " | TOPICAL_CREAM | Freq: Four times a day (QID) | CUTANEOUS | Status: DC

## 2013-04-15 MED ORDER — VALACYCLOVIR HCL 1 G PO TABS: 1000.0000 mg | ORAL_TABLET | Freq: Three times a day (TID) | ORAL | Status: DC

## 2013-04-15 NOTE — ED Provider Notes (Signed)
CSN: [id number]     Arrival date & time 04/15/13  0920 History   First MD Initiated Contact with Patient 04/15/13 [phone]     Chief Complaint  Patient presents with  . Herpes Zoster     (Consider location/radiation/quality/duration/timing/severity/associated sxs/prior Treatment) Patient is a 53 y.o. female presenting with rash. The history is provided by the patient.  Rash Location:  Torso Torso rash location:  R chest Quality: blistering, painful and redness   Pain details:    Severity:  Moderate   Onset quality:  Gradual   Duration:  10 days   Progression:  Worsening Severity:  Moderate Chronicity:  New   Past Medical History  Diagnosis Date  . Sinusitis   . Allergic rhinitis   . Asthma   . Dyslipidemia   . GERD (gastroesophageal reflux disease)   . Fibromyalgia   . Vitamin D deficiency   . Stress disorder, acute   . Obesity   . Anxiety   . DDD (degenerative disc disease)     also has spinal arthritis  . Diabetes mellitus     borderline  . Lung nodules     RIGHT LUNG--LAST EVALUATED BY CHEST CT 02/01/12 - STABLE AND FOLLOW UP PLANNED IN ONE YEAR.  . Depression    Past Surgical History  Procedure Laterality Date  . Ventral hernia repair  09/2006    Dr. Grandville Silos  . Laparoscopy  2002  . Endometrial ablation  2009  . Laparoscopic ventral hernia repair w/ mesh  08/2009    Dr. Grandville Silos, [place]  . Cesarean section  1982, 1985    x 2   . Tubal ligation    . Ventral hernia repair N/A 04/14/2012    Procedure: Laparoscopic Repair of  Ventral Hernia;  Surgeon: Pedro Earls, MD;  Location: [facility];  Service: General;  Laterality: N/A;  Repair of Recurrent Ventral Hernia  . Hernia repair  04/14/12    ventral hernia repair   Family History  Problem Relation Age of Onset  . Lung cancer Maternal Grandmother   . Cancer Maternal Grandmother     lung  . Colon cancer Neg Hx   . Diabetes Mother   . Heart disease Mother   . Arthritis Mother    History  Substance Use Topics   . Smoking status: Former Smoker    Quit date: 03/01/2004  . Smokeless tobacco: Never Used  . Alcohol Use: No   OB History   Grav Para Term Preterm Abortions TAB SAB Ect Mult Living                 Review of Systems  Constitutional: Negative.   Skin: Positive for rash.      Allergies  Progesterone; Codeine; and Cymbalta  Home Medications   Current Outpatient Rx  Name  Route  Sig  Dispense  Refill  . escitalopram (LEXAPRO) 10 MG tablet   Oral   Take 1.5 tablets by mouth daily.         [name] ibuprofen (ADVIL,MOTRIN) 200 MG tablet   Oral   Take 400 mg by mouth every 6 (six) hours as needed. Pain         . MAGNESIUM PO   Oral   Take 1 tablet by mouth daily.          [name] MINIVELLE 0.075 MG/24HR   Transdermal   Place 1 patch onto the skin every 3 (three) days.          [name] omeprazole (  PRILOSEC) 20 MG capsule   Oral   Take 1 capsule (20 mg total) by mouth 2 (two) times daily.   60 capsule   11   . B Complex-C (B-COMPLEX WITH VITAMIN C) tablet   Oral   Take 1 tablet by mouth daily.         [name] BIOTIN PO   Oral   Take 1 capsule by mouth daily.          . capsicum (ZOSTRIX) 0.075 % topical cream   Topical   Apply 1 application topically 4 (four) times daily.   60 g   1   . Cholecalciferol (VITAMIN D3) 1000 UNITS CAPS   Oral   Take 1 capsule by mouth daily.          . clobetasol ointment (TEMOVATE) 0.05 %      As directed         . diclofenac (VOLTAREN) 75 MG EC tablet   Oral   Take 75 mg by mouth 2 (two) times daily.         . fexofenadine (ALLEGRA) 180 MG tablet   Oral   Take 180 mg by mouth daily.          . Fluticasone-Salmeterol (ADVAIR DISKUS) 100-50 MCG/DOSE AEPB   Inhalation   Inhale 1 puff into the lungs 2 (two) times daily.   60 each   11   . HYDROcodone-acetaminophen (NORCO/VICODIN) 5-325 MG per tablet   Oral   Take 1 tablet by mouth every 8 (eight) hours as needed.         [name] LORazepam (ATIVAN) 1 MG tablet      Take  1/2 to 1 tablet by mouth three times daily as needed for anxiety   90 tablet   1   . Melatonin 3 MG TABS   Oral   Take 1 tablet by mouth at bedtime.         . montelukast (SINGULAIR) 10 MG tablet   Oral   Take 1 tablet (10 mg total) by mouth at bedtime.   30 tablet   11   . nabumetone (RELAFEN) 750 MG tablet   Oral   Take 750 mg by mouth 2 (two) times daily. As needed for arthritis pain         . oxyCODONE-acetaminophen (PERCOCET/ROXICET) 5-325 MG per tablet   Oral   Take 1-2 tablets by mouth every 4 (four) hours as needed.   40 tablet   0   . predniSONE (DELTASONE) 10 MG tablet      4 tabs for 2 days, then 3 tabs for 2 days, 2 tabs for 2 days, then 1 tab for 2 days, then stop   20 tablet   0   . PROVENTIL HFA 108 (90 BASE) MCG/ACT inhaler      INHALE 2 PUFFS EVERY 4 HOURS AS NEEDED FOR WHEEZING   6.7 each   4   . valACYclovir (VALTREX) 1000 MG tablet   Oral   Take 1 tablet (1,000 mg total) by mouth 3 (three) times daily.   21 tablet   0    BP 168/92  Pulse 78  Temp(Src) 99 F (37.2 C) (Oral)  Resp 18  SpO2 99% Physical Exam  Nursing note and vitals reviewed. Constitutional: She is oriented to person, place, and time. She appears well-developed and well-nourished.  Neurological: She is alert and oriented to person, place, and time.  Skin: Skin is warm and dry. Rash noted.  Right dermatomal vesicular rash on chest, tender, neuralgia.    ED Course  Procedures (including critical care time) Labs Review Labs Reviewed - No data to display Imaging Review No results found.    MDM   Final diagnoses:  Herpes zoster dermatitis        Billy Fischer, MD 04/15/13 1024

## 2013-04-15 NOTE — ED Notes (Signed)
C/O right mid-back soreness with small "patch" of rash; over past few days lesions have wrapped around to right side of torso; c/o severe "burning, stabbing pains" with some pruritis.

## 2013-04-23 ENCOUNTER — Ambulatory Visit (INDEPENDENT_AMBULATORY_CARE_PROVIDER_SITE_OTHER)
Admission: RE | Admit: 2013-04-23 | Discharge: 2013-04-23 | Disposition: A | Discharge status: Home / Self Care | Payer: 59 | Source: Ambulatory Visit | Attending: Pulmonary Disease | Admitting: Pulmonary Disease

## 2013-04-23 ENCOUNTER — Ambulatory Visit (INDEPENDENT_AMBULATORY_CARE_PROVIDER_SITE_OTHER): Payer: 59 | Admitting: Pulmonary Disease

## 2013-04-23 ENCOUNTER — Encounter: Payer: Self-pay | Admitting: Pulmonary Disease

## 2013-04-23 VITALS — BP 134/80 | HR 102 | Temp 97.1°F | Ht 64.0 in | Wt 212.6 lb

## 2013-04-23 DIAGNOSIS — R918 Other nonspecific abnormal finding of lung field: Secondary | ICD-10-CM

## 2013-04-23 DIAGNOSIS — K760 Fatty (change of) liver, not elsewhere classified: Secondary | ICD-10-CM

## 2013-04-23 DIAGNOSIS — K219 Gastro-esophageal reflux disease without esophagitis: Secondary | ICD-10-CM

## 2013-04-23 DIAGNOSIS — K7689 Other specified diseases of liver: Secondary | ICD-10-CM

## 2013-04-23 DIAGNOSIS — J45909 Unspecified asthma, uncomplicated: Secondary | ICD-10-CM

## 2013-04-23 DIAGNOSIS — Z9889 Other specified postprocedural states: Secondary | ICD-10-CM

## 2013-04-23 DIAGNOSIS — E785 Hyperlipidemia, unspecified: Secondary | ICD-10-CM

## 2013-04-23 DIAGNOSIS — Z8719 Personal history of other diseases of the digestive system: Secondary | ICD-10-CM

## 2013-04-23 DIAGNOSIS — IMO0002 Reserved for concepts with insufficient information to code with codable children: Secondary | ICD-10-CM

## 2013-04-23 DIAGNOSIS — IMO0001 Reserved for inherently not codable concepts without codable children: Secondary | ICD-10-CM

## 2013-04-23 DIAGNOSIS — E663 Overweight: Secondary | ICD-10-CM

## 2013-04-23 MED ORDER — FLUTICASONE-SALMETEROL 100-50 MCG/DOSE IN AEPB: 1.0000 | INHALATION_SPRAY | Freq: Two times a day (BID) | RESPIRATORY_TRACT | Status: DC

## 2013-04-23 MED ORDER — MONTELUKAST SODIUM 10 MG PO TABS: 10.0000 mg | ORAL_TABLET | Freq: Every day | ORAL | Status: DC

## 2013-04-23 MED ORDER — ALBUTEROL SULFATE HFA 108 (90 BASE) MCG/ACT IN AERS: INHALATION_SPRAY | RESPIRATORY_TRACT | Status: DC
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
## 2013-04-23 NOTE — Patient Instructions (Signed)
Today we updated your med list in our EPIC system...    Continue your current medications the same...    We refilled your  meds per request...  Today we did a follow up CXR...    We will contact you w/ the results when available...   Let's get back on track w/ our diet & exercise program...    Try to get back to the "optifast" weight...  Call for any questions.Marland KitchenMarland Kitchen

## 2013-04-25 ENCOUNTER — Telehealth: Payer: Self-pay | Admitting: Pulmonary Disease

## 2013-04-25 NOTE — Telephone Encounter (Signed)
Notes Recorded by Noralee Space, MD on 04/25/2013 at 8:42 AM Please notify patient>  CXR is ok- unchanged fat pad, clear lungs...  I spoke with patient about results and she verbalized understanding and had no questions.

## 2013-07-05 ENCOUNTER — Encounter: Payer: Self-pay | Admitting: Family Medicine

## 2013-07-05 ENCOUNTER — Ambulatory Visit (INDEPENDENT_AMBULATORY_CARE_PROVIDER_SITE_OTHER): Payer: 59 | Admitting: Family Medicine

## 2013-07-05 VITALS — BP 120/86 | HR 78 | Temp 97.8°F | Ht 64.76 in | Wt 209.5 lb

## 2013-07-05 DIAGNOSIS — R0789 Other chest pain: Secondary | ICD-10-CM | POA: Insufficient documentation

## 2013-07-05 DIAGNOSIS — J45909 Unspecified asthma, uncomplicated: Secondary | ICD-10-CM

## 2013-07-05 DIAGNOSIS — IMO0001 Reserved for inherently not codable concepts without codable children: Secondary | ICD-10-CM

## 2013-07-05 DIAGNOSIS — R7301 Impaired fasting glucose: Secondary | ICD-10-CM

## 2013-07-05 DIAGNOSIS — R918 Other nonspecific abnormal finding of lung field: Secondary | ICD-10-CM

## 2013-07-05 DIAGNOSIS — F410 Panic disorder [episodic paroxysmal anxiety] without agoraphobia: Secondary | ICD-10-CM

## 2013-07-05 DIAGNOSIS — E785 Hyperlipidemia, unspecified: Secondary | ICD-10-CM

## 2013-07-05 LAB — LIPID PANEL
CHOL/HDL RATIO: 4
Cholesterol: 191 mg/dL (ref 0–200)
HDL: 44.2 mg/dL (ref >39.00)
LDL CALC: 107 mg/dL — AB (ref 0–99)
Triglycerides: 197 mg/dL — ABNORMAL HIGH (ref 0.0–149.0)
VLDL: 39.4 mg/dL (ref 0.0–40.0)

## 2013-07-05 LAB — COMPREHENSIVE METABOLIC PANEL
ALBUMIN: 4.7 g/dL (ref 3.5–5.2)
ALT: 28 U/L (ref 0–35)
AST: 24 U/L (ref 0–37)
Alkaline Phosphatase: 41 U/L (ref 39–117)
BUN: 15 mg/dL (ref 6–23)
CALCIUM: 9.3 mg/dL (ref 8.4–10.5)
CHLORIDE: 106 meq/L (ref 96–112)
CO2: 29 mEq/L (ref 19–32)
Creatinine, Ser: 1 mg/dL (ref 0.4–1.2)
GFR: 65.63 mL/min (ref >60.00)
Glucose, Bld: 109 mg/dL — ABNORMAL HIGH (ref 70–99)
POTASSIUM: 4.3 meq/L (ref 3.5–5.1)
Sodium: 139 mEq/L (ref 135–145)
Total Bilirubin: 0.5 mg/dL (ref 0.2–1.2)
Total Protein: 7.7 g/dL (ref 6.0–8.3)

## 2013-07-05 LAB — HEMOGLOBIN A1C: Hgb A1c MFr Bld: 5.9 % (ref 4.6–6.5)

## 2013-07-05 NOTE — Progress Notes (Signed)
Subjective:    Patient ID: Jaime Chapman, female    DOB: 09/12/1960, 53 y.o.   MRN: [id number]   HPI  53 year old female presents to establish care. She previously saw Dr. Lenna Gilford with pulmonology. She sees Dr. Nori Riis for GYN. Last CPX 10/2012 pap, mammogram etc.  Colonoscopy Dr, Stark 2013 nml, repeat 10 year  Dr. Estanislado Pandy for Rheum: fibromyalgia and vit D deficiency: moderate on on relafen, rarely uses hydrocodone for pain.  DrMcLean for Cards: Had eval for chest pain years ago, does not see him regularly ORTHO Dr. Ninfa Linden for neck and low back pain:   In last week she has had episode of chest tightness/pressure, no pain, shortness of breath.  Occurred while walking slowly.  Lasted 3 min after rest and relaxation.  Had nml EKG and treadmill stress test several years.  She also has history of panic attacks and anxiety... This may have been due to this. Uses ativan  As needed, uses about 1 time a week.  She states mood much bettter control since leaving her job 11/2012.  No issues since or further episodes. She has been feeling well otherwise.  Overdue for chol and DM screen. Hx of dyslipidemia and prediabtes.    Review of Systems  Constitutional: Negative for fever and fatigue.  HENT: Negative for ear pain.   Eyes: Negative for pain.  Respiratory: Negative for chest tightness and shortness of breath.   Cardiovascular: Negative for chest pain, palpitations and leg swelling.       No current chest pain  Gastrointestinal: Negative for abdominal pain.  Genitourinary: Negative for dysuria.       Objective:   Physical Exam  Constitutional: She is oriented to person, place, and time. Vital signs are normal. She appears well-developed and well-nourished. She is cooperative.  Non-toxic appearance. She does not appear ill. No distress.  Central obesity  HENT:  Head: Normocephalic.  Right Ear: Hearing, tympanic membrane, external ear and ear canal normal. Tympanic membrane is not  erythematous, not retracted and not bulging.  Left Ear: Hearing, tympanic membrane, external ear and ear canal normal. Tympanic membrane is not erythematous, not retracted and not bulging.  Nose: No mucosal edema or rhinorrhea. Right sinus exhibits no maxillary sinus tenderness and no frontal sinus tenderness. Left sinus exhibits no maxillary sinus tenderness and no frontal sinus tenderness.  Mouth/Throat: Uvula is midline, oropharynx is clear and moist and mucous membranes are normal.  Eyes: Conjunctivae, EOM and lids are normal. Pupils are equal, round, and reactive to light. Lids are everted and swept, no foreign bodies found.  Neck: Trachea normal and normal range of motion. Neck supple. Carotid bruit is not present. No mass and no thyromegaly present.  Cardiovascular: Normal rate, regular rhythm, S1 normal, S2 normal, normal heart sounds, intact distal pulses and normal pulses.  Exam reveals no gallop and no friction rub.   No murmur heard. Pulmonary/Chest: Effort normal and breath sounds normal. Not tachypneic. No respiratory distress. She has no decreased breath sounds. She has no wheezes. She has no rhonchi. She has no rales.  Abdominal: Soft. Normal appearance and bowel sounds are normal. There is no tenderness.  Neurological: She is alert and oriented to person, place, and time.  Skin: Skin is warm, dry and intact. No rash noted.  Psychiatric: Her speech is normal and behavior is normal. Judgment and thought content normal. Her mood appears not anxious. Cognition and memory are normal. She does not exhibit a depressed mood.  Assessment & Plan:

## 2013-07-05 NOTE — Assessment & Plan Note (Signed)
Due for re-eval. 

## 2013-07-05 NOTE — Assessment & Plan Note (Signed)
Stable control., Using ativan 1 time a week.

## 2013-07-05 NOTE — Progress Notes (Signed)
Pre visit review using our clinic review tool, if applicable. No additional management support is needed unless otherwise documented below in the visit note. 

## 2013-07-05 NOTE — Patient Instructions (Addendum)
Stop at lab on way out.. Call if chest pain recurring, if severe go to ER. Schedule CPX in next 1-2 months, no labs prior.

## 2013-07-05 NOTE — Assessment & Plan Note (Addendum)
EKG showed no changes from 2014.  Symptoms likely due to anxiety/panic vs GERD.  If recurs consider stress test.  She has family history risk, elevated glucose hx and lipid hx.

## 2013-07-06 ENCOUNTER — Encounter: Payer: Self-pay | Admitting: *Deleted

## 2013-10-09 ENCOUNTER — Ambulatory Visit (INDEPENDENT_AMBULATORY_CARE_PROVIDER_SITE_OTHER): Payer: No Typology Code available for payment source | Admitting: Family Medicine

## 2013-10-09 ENCOUNTER — Encounter: Payer: Self-pay | Admitting: Family Medicine

## 2013-10-09 VITALS — BP 140/86 | HR 73 | Temp 98.0°F | Ht 64.5 in | Wt 205.0 lb

## 2013-10-09 DIAGNOSIS — R7301 Impaired fasting glucose: Secondary | ICD-10-CM

## 2013-10-09 DIAGNOSIS — E785 Hyperlipidemia, unspecified: Secondary | ICD-10-CM

## 2013-10-09 DIAGNOSIS — Z0000 Encounter for general adult medical examination without abnormal findings: Secondary | ICD-10-CM

## 2013-10-09 NOTE — Assessment & Plan Note (Signed)
Likely improved with diet changes.

## 2013-10-09 NOTE — Progress Notes (Signed)
Subjective:    Patient ID: Jaime Chapman, female    DOB: 26-Aug-1960, 53 y.o.   MRN: [id number]  HPI  The patient is here for annual wellness exam and preventative care.    She previously saw Dr. Lenna Gilford with pulmonology.  She sees Dr. Nori Riis for GYN.  Last CPX 10/2012 pap, mammogram etc.  Colonoscopy Dr, [place] 2013 nml, repeat 10 year   Dr. Estanislado Pandy for Rheum: Fibromyalgia and vit D deficiency: moderate on on relafen, rarely uses hydrocodone for pain.  DrMcLean for Cards: Had eval for chest pain years ago, does not see him regularly  ORTHO Dr. Ninfa Linden for neck and low back pain:   HX of Elevated Cholesterol: LDL at goal < 130 on no med. Lab Results  Component Value Date   CHOL 191 07/05/2013   HDL 44.20 07/05/2013   LDLCALC 107* 07/05/2013   LDLDIRECT 99.5 05/21/2009   TRIG 197.0* 07/05/2013   CHOLHDL 4 07/05/2013  Diet compliance: Improving diet, more veggies spinach, less carbs. Exercise: walking some, gardening, swimming. Other complaints:  HX of impaired glucose tolerance:  Glucose 109 Lab Results  Component Value Date   HGBA1C 5.9 07/05/2013    She has been seeing chiropractor/alternative med Dr. Leonides Schanz. Who recommended gluten and lactose free diet for belly bloating. Also told lowish thyroid in 07/2013..started on supplements. She has started feeling better. She is less bloated. Wt Readings from Last 3 Encounters:  10/09/13 205 lb (92.987 kg)  07/05/13 209 lb 8 oz (95.029 kg)  04/23/13 212 lb 9.6 oz (96.435 kg)    No further chest pain. Rare palpitations.      Review of Systems  Constitutional: Positive for fatigue. Negative for fever.  HENT: Negative for ear pain.   Eyes: Negative for pain.  Respiratory: Negative for chest tightness and shortness of breath.   Cardiovascular: Negative for chest pain, palpitations and leg swelling.  Gastrointestinal: Positive for abdominal pain.       Occ pain in RUQ no relation to food  Genitourinary: Negative for dysuria.    Hematological: Bruises/bleeds easily.       Objective:   Physical Exam  Constitutional: Vital signs are normal. She appears well-developed and well-nourished. She is cooperative.  Non-toxic appearance. She does not appear ill. No distress.  HENT:  Head: Normocephalic.  Right Ear: Hearing, tympanic membrane, external ear and ear canal normal.  Left Ear: Hearing, tympanic membrane, external ear and ear canal normal.  Nose: Nose normal.  Eyes: Conjunctivae, EOM and lids are normal. Pupils are equal, round, and reactive to light. Lids are everted and swept, no foreign bodies found.  Neck: Trachea normal and normal range of motion. Neck supple. Carotid bruit is not present. No mass and no thyromegaly present.  Cardiovascular: Normal rate, regular rhythm, S1 normal, S2 normal, normal heart sounds and intact distal pulses.  Exam reveals no gallop.   No murmur heard. Pulmonary/Chest: Effort normal and breath sounds normal. No respiratory distress. She has no wheezes. She has no rhonchi. She has no rales.  Abdominal: Soft. Normal appearance and bowel sounds are normal. She exhibits no distension, no fluid wave, no abdominal bruit and no mass. There is no hepatosplenomegaly. There is no tenderness. There is no rebound, no guarding and no CVA tenderness. No hernia.  Lymphadenopathy:    She has no cervical adenopathy.    She has no axillary adenopathy.  Neurological: She is alert. She has normal strength. No cranial nerve deficit or sensory deficit.  Skin:  Skin is warm, dry and intact. No rash noted.  Psychiatric: Her speech is normal and behavior is normal. Judgment normal. Her mood appears not anxious. Cognition and memory are normal. She does not exhibit a depressed mood.          Assessment & Plan:  The patient's preventative maintenance and recommended screening tests for an annual wellness exam were reviewed in full today. Brought up to date unless services declined.  Counselled on the  importance of diet, exercise, and its role in overall health and mortality. The patient's FH and SH was reviewed, including their home life, tobacco status, and drug and alcohol status.   Vaccines: up to date with Tdap  Mammo: 10/2012  Breast/pelvic: Per Dr. Nori Riis Colonoscopy Dr, [place] 2013 nml, repeat 10 year  Former smoker 15 pack year history  Pulm nodules followed by Dr. Lenna Gilford.

## 2013-10-09 NOTE — Patient Instructions (Addendum)
Bring thyroid test results by. Work on increasing exercise as tolerated and weight loss. Call if symptoms returning.

## 2013-10-09 NOTE — Assessment & Plan Note (Signed)
Well controlled on no med. Encouraged exercise, weight loss, healthy eating habits.   

## 2013-10-09 NOTE — Progress Notes (Signed)
Pre visit review using our clinic review tool, if applicable. No additional management support is needed unless otherwise documented below in the visit note. 

## 2013-10-31 ENCOUNTER — Telehealth: Payer: Self-pay | Admitting: Family Medicine

## 2013-10-31 NOTE — Telephone Encounter (Signed)
Patient dropped off lab report from her Chiropractor for you to review on Monday.  Patient asked to be called back on her cell phone.

## 2013-11-16 ENCOUNTER — Telehealth: Payer: Self-pay

## 2013-11-16 DIAGNOSIS — E039 Hypothyroidism, unspecified: Secondary | ICD-10-CM | POA: Insufficient documentation

## 2013-11-16 MED ORDER — LEVOTHYROXINE SODIUM 50 MCG PO TABS: 50.0000 ug | ORAL_TABLET | Freq: Every day | ORAL | Status: DC

## 2013-11-16 NOTE — Telephone Encounter (Signed)
He thyroid function looks normal but is borderline with one test. No thyroid med recommended but will rec recheck here in 3 months TSH, free T3 and free T4.

## 2013-11-16 NOTE — Telephone Encounter (Signed)
Pt brought thyroid lab results to office couple of weeks ago for Dr Diona Browner to review (11/06/13 scanned document). Pt request cb to see if pt needs to take thyroid med.Please advise.

## 2013-11-16 NOTE — Telephone Encounter (Signed)
I disagree, there is only one borderline test. Given her symptoms we can start low dose thyroid med, but we cannot go out of range of normal . We do not want to cause her to have too much thyroid hormone as this cause issues as well. Will send in rx and have her schedule her to have labs in 3-4 weeks.

## 2013-11-16 NOTE — Telephone Encounter (Signed)
Jaime Chapman notified as instructed by telephone.  She states that all her levels are sub-optimal according to the levels written in under the flag column by her chiropractor.  She states she can't lose weight and her hair is falling out so something has to be wrong with her thyroid. I advised I would send message back to Dr. Diona Browner with this information for review but I am not sure that Dr. Diona Browner will prescribed thyroid medication at this time.

## 2013-11-19 NOTE — Telephone Encounter (Signed)
Talita notified as instructed by telephone.  She will call back to schedule lab appointment for 4 weeks.  While on the phone, Ilse states she is having abdominal pain, bloating and SOB.  Symptoms did not sound emergent.   Appointment scheduled with Dr. Diona Browner 11/20/2013 at 9:00 am.

## 2013-11-20 ENCOUNTER — Encounter: Payer: Self-pay | Admitting: Family Medicine

## 2013-11-20 ENCOUNTER — Ambulatory Visit (INDEPENDENT_AMBULATORY_CARE_PROVIDER_SITE_OTHER): Payer: No Typology Code available for payment source | Admitting: Family Medicine

## 2013-11-20 VITALS — BP 140/86 | HR 75 | Temp 98.1°F | Ht 64.5 in | Wt 206.8 lb

## 2013-11-20 DIAGNOSIS — R143 Flatulence: Secondary | ICD-10-CM

## 2013-11-20 DIAGNOSIS — R140 Abdominal distension (gaseous): Secondary | ICD-10-CM

## 2013-11-20 DIAGNOSIS — K5909 Other constipation: Secondary | ICD-10-CM

## 2013-11-20 DIAGNOSIS — R141 Gas pain: Secondary | ICD-10-CM

## 2013-11-20 DIAGNOSIS — E039 Hypothyroidism, unspecified: Secondary | ICD-10-CM

## 2013-11-20 DIAGNOSIS — R142 Eructation: Secondary | ICD-10-CM

## 2013-11-20 DIAGNOSIS — E559 Vitamin D deficiency, unspecified: Secondary | ICD-10-CM

## 2013-11-20 DIAGNOSIS — K5904 Chronic idiopathic constipation: Secondary | ICD-10-CM

## 2013-11-20 NOTE — Telephone Encounter (Signed)
Noted  

## 2013-11-20 NOTE — Progress Notes (Signed)
Subjective:    Patient ID: Jaime Chapman, female    DOB: Jul 28, 1960, 53 y.o.   MRN: [id number]  Abdominal Pain Pertinent negatives include no dysuria or fever.  Shortness of Breath Associated symptoms include abdominal pain. Pertinent negatives include no chest pain, ear pain, fever or leg swelling.    53 year old female with history of smoking, asthma, GERD presents with new onset diffuse abdominal pain from bloating in abdomen longterm, off and on flares, worse in last few weeks.. Distension of abdomen at times cause shortness of breath which then triggers her anxiety and panic disorder. No specific trigger, sometimes after meals , sometimes No chest pain.  She has noted some improvement limiting dairy and going gluten free. She is chronically constipated. BMs occuring ever few days. Stool softners, miralax in past do not help.   No cough, no wheeze, no fever, no cold symptoms associated.   ( hx of hernia repair in 2011, repeat repair last year, since then abdominal pain)   She has tried albuterol without relief for SOB.  She has been out of vit D for last 7 months.  She is starting low dose thyroid medication.  For borderline thyroid issues.  Liver function tests nml in 06/2013.   Review of Systems  Constitutional: Negative for fever and fatigue.  HENT: Negative for ear pain.   Eyes: Negative for pain.  Respiratory: Positive for shortness of breath. Negative for chest tightness.   Cardiovascular: Negative for chest pain, palpitations and leg swelling.  Gastrointestinal: Positive for abdominal pain.  Genitourinary: Negative for dysuria.       Objective:   Physical Exam  Constitutional: Vital signs are normal. She appears well-developed and well-nourished. She is cooperative.  Non-toxic appearance. She does not appear ill. No distress.  HENT:  Head: Normocephalic.  Right Ear: Hearing, tympanic membrane, external ear and ear canal normal. Tympanic membrane is not  erythematous, not retracted and not bulging.  Left Ear: Hearing, tympanic membrane, external ear and ear canal normal. Tympanic membrane is not erythematous, not retracted and not bulging.  Nose: No mucosal edema or rhinorrhea. Right sinus exhibits no maxillary sinus tenderness and no frontal sinus tenderness. Left sinus exhibits no maxillary sinus tenderness and no frontal sinus tenderness.  Mouth/Throat: Uvula is midline, oropharynx is clear and moist and mucous membranes are normal.  Eyes: Conjunctivae, EOM and lids are normal. Pupils are equal, round, and reactive to light. Lids are everted and swept, no foreign bodies found.  Neck: Trachea normal and normal range of motion. Neck supple. Carotid bruit is not present. No mass and no thyromegaly present.  Cardiovascular: Normal rate, regular rhythm, S1 normal, S2 normal, normal heart sounds, intact distal pulses and normal pulses.  Exam reveals no gallop and no friction rub.   No murmur heard. Pulmonary/Chest: Effort normal and breath sounds normal. Not tachypneic. No respiratory distress. She has no decreased breath sounds. She has no wheezes. She has no rhonchi. She has no rales.  Abdominal: Soft. Normal appearance and bowel sounds are normal. There is generalized tenderness. There is no rigidity, no rebound, no guarding and no CVA tenderness.  Neurological: She is alert.  Skin: Skin is warm, dry and intact. No rash noted.  Psychiatric: Her speech is normal and behavior is normal. Judgment and thought content normal. Her mood appears not anxious. Cognition and memory are normal. She does not exhibit a depressed mood.          Assessment & Plan:  Shortness of breath and anxiety stemming from abdominal bloating and pain. No clear cardiac or respiratory issue.  Abdominal pain/bloating likely IBS, with constipation.  May improve with start of thyroid med.  Add probiotic.  Start fiber, water and exercise. If not improving , given failure of  all OTC meds, will consider amitiza at next appt.

## 2013-11-20 NOTE — Progress Notes (Signed)
Pre visit review using our clinic review tool, if applicable. No additional management support is needed unless otherwise documented below in the visit note. 

## 2013-11-20 NOTE — Patient Instructions (Signed)
Start align over the counter for probiotic. Start thyroid medication as directed.  Push fluids, start regular exercise Return for labs as schedule. Schedule follow up after labs.

## 2013-11-28 ENCOUNTER — Other Ambulatory Visit: Payer: Self-pay | Admitting: Pulmonary Disease

## 2013-12-13 DIAGNOSIS — A6920 Lyme disease, unspecified: Secondary | ICD-10-CM | POA: Insufficient documentation

## 2013-12-14 ENCOUNTER — Other Ambulatory Visit: Payer: No Typology Code available for payment source

## 2013-12-20 ENCOUNTER — Ambulatory Visit: Payer: No Typology Code available for payment source | Admitting: Family Medicine

## 2014-04-08 ENCOUNTER — Other Ambulatory Visit: Payer: Self-pay | Admitting: Obstetrics & Gynecology

## 2014-04-09 ENCOUNTER — Telehealth: Payer: Self-pay | Admitting: Pulmonary Disease

## 2014-04-09 LAB — CYTOLOGY - PAP

## 2014-04-09 NOTE — Telephone Encounter (Signed)
Spoke with patient-appointment scheduled for Wednesday 04-24-14 at 9:30am. Pt would like to know if she will need lab work piror to visit. Pt aware that I will send message to Leigh to have SN advise. Thanks.

## 2014-04-09 NOTE — Telephone Encounter (Signed)
Per SN---  Labs are being done by her new PCP  Dr. Diona Browner, so we will check her cxr that day at her appt.   Called and spoke with pt and she is aware and nothing further is needed.

## 2014-04-24 ENCOUNTER — Ambulatory Visit: Payer: No Typology Code available for payment source | Admitting: Pulmonary Disease

## 2014-05-01 ENCOUNTER — Ambulatory Visit (INDEPENDENT_AMBULATORY_CARE_PROVIDER_SITE_OTHER)
Admission: RE | Admit: 2014-05-01 | Discharge: 2014-05-01 | Disposition: A | Discharge status: Home / Self Care | Payer: 59 | Source: Ambulatory Visit | Attending: Pulmonary Disease | Admitting: Pulmonary Disease

## 2014-05-01 ENCOUNTER — Ambulatory Visit (INDEPENDENT_AMBULATORY_CARE_PROVIDER_SITE_OTHER): Payer: 59 | Admitting: Pulmonary Disease

## 2014-05-01 ENCOUNTER — Encounter: Payer: Self-pay | Admitting: Pulmonary Disease

## 2014-05-01 VITALS — BP 144/90 | HR 77 | Temp 96.1°F | Ht 64.0 in | Wt 201.5 lb

## 2014-05-01 DIAGNOSIS — J4540 Moderate persistent asthma, uncomplicated: Secondary | ICD-10-CM

## 2014-05-01 DIAGNOSIS — E039 Hypothyroidism, unspecified: Secondary | ICD-10-CM

## 2014-05-01 DIAGNOSIS — J302 Other seasonal allergic rhinitis: Secondary | ICD-10-CM

## 2014-05-01 DIAGNOSIS — F410 Panic disorder [episodic paroxysmal anxiety] without agoraphobia: Secondary | ICD-10-CM

## 2014-05-01 DIAGNOSIS — R918 Other nonspecific abnormal finding of lung field: Secondary | ICD-10-CM

## 2014-05-01 DIAGNOSIS — K219 Gastro-esophageal reflux disease without esophagitis: Secondary | ICD-10-CM

## 2014-05-01 MED ORDER — FLUTICASONE-SALMETEROL 100-50 MCG/DOSE IN AEPB: 1.0000 | INHALATION_SPRAY | Freq: Two times a day (BID) | RESPIRATORY_TRACT | Status: DC

## 2014-05-01 MED ORDER — ALBUTEROL SULFATE HFA 108 (90 BASE) MCG/ACT IN AERS: INHALATION_SPRAY | RESPIRATORY_TRACT | Status: DC

## 2014-05-01 MED ORDER — MONTELUKAST SODIUM 10 MG PO TABS: 10.0000 mg | ORAL_TABLET | Freq: Every day | ORAL | Status: DC

## 2014-05-01 MED ORDER — LORAZEPAM 1 MG PO TABS: ORAL_TABLET | ORAL | Status: DC

## 2014-05-01 NOTE — Progress Notes (Signed)
Subjective:    Patient ID: Jaime Chapman, female    DOB: 11-06-1960, 54 y.o.   MRN: [id number]  HPI 54 y/o WF here for a follow up visit...  Followed for general medical purposes w/ hx of Asthma (ex-smoker quit 2006), Dyslipidiemia, GERD, DJD/ DDD/ FM, and Anxiety... she is also followed by DrDeveshwar for Rheum, DrMcLean for Cards, & DrThompson/MMartin for CCS... ~  SEE PREV EPIC NOTES FOR OLDER DATA >>   ~  January 26, 2012:  Yearly ROV & CPX> Jaime Chapman has done a fabulous job w/ wt reduction on an [employer] from [place] in [employer]- it costs her $100/wk & includes an 800cal diet & Optifast supplements- she has lost 42# down to 188# today...  She had an URI w/ asthma exac several wks ago- seen by TP & Rx w/ Omnicef, Pred, Mucinex & much improved now...  She is due for her f/u CTChest to re-eval the 2 tiny nodules seen 12/12 CTA in [place]...    AR/ Asthma> on Allegra, Advair100, Singulair10, Proair rescue, Mucinex prn; recent URI/AB episode resolved & breathing at baseline now on maint meds.    Hx Abn CTA> this was 12/12 in [place]; scan was neg for PE but showed incidental 29mm nodule postRUL & 30mm nodule antLLL, bullous dis in sup seg RLL, & atx in bases; plan is for f/u CTChest now (52mo later)...    Hx CP> likely musculoskeletal pain w/ neg cardiac eval DrMcLean 2011; no recurrence & she is doing exercises etc...    Dyslipid> on diet & FishOil;  FLP 11/13 shows TChol 198, TG 106, HDL 54, LDL 123    Hx Borderline DM> on diet alone; prev FBS 101-127 & random BS in [place] 182; last yr BS=114 & A1c=5.9; now w/ 42# wt loss her BS=88...    Obesity> wt= 230# ==> 188# on Optifast program thru [employer] ...    GI> she saw DrStark 9/12> recurrent ventral hernia; EGD w/ gastritis (neg HPylori) & rec incr Omep20 to Bid; Colon w/ 1 hyperpl polyp removed- Rx Miralax & f/u 23yrs; Fatty liver dis=> should improve w/ wt reduction...    Ortho> DDD, FM, etc> followed by DrDeveshwar on Relafen, Tylenol,  Amrix15; she is somewhat improved w/ her wt reduction...    Anxiety/ Panic> she has Alprazolam for prn use & we reviewed this med... We reviewed prob list, meds, xrays and labs> see below for updates >> she had Flu vaccine 10/13...  LABS 11/13:  FLP- ok x LDL=123;  Chems- ok x SGPT=49;  CBC- wnl x wbc=11.8 after Pred;  TSH=1.06;  UA- clear...  CT Chest 11/13 ==> stable nodules measuring 36mmin postRUL & 91mm in RLL, no change, NAD...  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 We reviewed the following medical problems during today's office visit >>     AR/ Asthma> on Allegra, Advair100, Singulair10, Proair rescue, Mucinex prn; no recent URI/AB episodes & breathing at baseline now on maint meds.    Hx Abn CTA> this was 12/12  in [place]; scan was neg for PE but showed incidental 68mm nodule postRUL & 32mm nodule antLLL, bullous dis in sup seg RLL, & atx in bases; f/u CTChest 11/13 was stable, no change...    Hx CP> likely musculoskeletal pain w/ neg cardiac eval DrMcLean 2011; no recurrence & she is doing exercises etc...    Dyslipid> on diet & FishOil;  FLP 11/13 showed TChol 198, TG 106, HDL 54, LDL 123    Hx Borderline DM> on diet alone; prev FBS 101-127 & random BS in [place] 182; last yr BS=114 & A1c=5.9; now w/ 42# wt loss her BS=88...    Obesity> wt= 230# ==> 188# on Optifast program thru [facility]; now back to 213# 7 we reviewed diet, exercise, & wt reduction strategies...Marland KitchenMarland Kitchen.    GI> she saw DrStark 9/12> recurrent ventral hernia; EGD w/ gastritis (neg HPylori) & rec incr  Omep20 to Bid; Colon w/ 1 hyperpl polyp removed- Rx Miralax & f/u 29yrs; Fatty liver dis=> should improve w/ wt reduction...    Ortho> DDD, FM, etc> followed by DrDeveshwar on Relafen, Tylenol, Amrix15; she is somewhat improved w/ her wt reduction...    Anxiety/ Panic> she has Alprazolam for prn use & we reviewed this med... We reviewed prob list, meds, xrays and labs> see below for updates >>   CXR 2/15 showed heart at upper lim of norm, lungs clear w/o lesions, NAD...  ~  May 01, 2014:  1 year ROV & Jaime Chapman is going thru a lot of stress w/ husb recent Dx of lung cancer (age 68, smoker); she is seeing Jackelyn Hoehn for counseling (prev Richardo Priest) & she is given refill of Ativan 1mg - 1/2 to 1 tab Tid prn... She is a free Veterinary surgeon; she has established Primary Care w/ DrBedsole but sees Dr. Harvel Quale ([place] [facility] w/ Dr. Lenor Coffin & Dr. Roena Malady) for Nature's Thyroid and bioequiv hormonal therapy (she states referred there by her Chiro since her Lyme test was neg but she had dog tapeworm & required treatment w/ Biltricide); she says she's sensitive to gluten (seen on food allergy panel) and now taking supplements from these doctors...     We follow her for hx Asthma and an abn CT Chest>> she is an ex-smoker, quit in 2006...    AR/ Asthma> on Allegra, Advair100Bid, Singulair10, Proair rescue, Mucinex prn; no recent URI/AB episodes & breathing at baseline now on maint meds w/o cough/ sput/ dyspnea/ CP/ etc...    Hx Abn CTA> this was 12/12 in [place]; scan was neg for PE but showed incidental 74mm nodule postRUL & 58mm nodule antLLL, bullous dis in sup seg RLL, & atx in bases; f/u CTChest 11/13 was stable, no change...    Anxiety/ Panic> she has Alprazolam for prn use & we renewed this med at her request in light of her husband's recent lung cancer dx.. We reviewed prob list, meds, xrays and labs> see below for updates >> her last Flu vaccine was 1/15 per  Epic...  CXR 3/16 showed norm heart size, prom epicard fat pad, no nodules seen on CXR/ clear/ NAD.Marland KitchenMarland Kitchen                Problem List:   ALLERGIC RHINITIS (ICD-477.9) - on ALLEGRA 180/d- states she needs it daily!  ASTHMA (ICD-493.90) >> on ADVAIR 100Bid, SINGULAIR 10mg /d, PROVENTIL HFA rescue, & MUCINEX 1-2 Bid... ABNORMAL CT CHEST (ICD=793.19) >>   ~  she had an asthma flair  in Feb09 due to exposure w/ grandson in daycare rx'd w/ Levaquin and Pred and improved...  ~  CXR 8/12 showed prom epicardial fat pad, normal heart size, clear lungs... ~  PFTs 8/12 showed FVC=2.14 (64%), FEV1=1.65 (60%), %1sec=77, mid-flows= 48% predicted... ~  12/12: prob sudden panic attack & evaluated in [place]> CTAngio neg for PE, incidental 49mm nodule postRUL & 91mm nodule antLLL, bullous dis in sup seg RLL, & atx in bases;  We discussed protocol for f/u CXR & CT... ~  CT Chest 12/13 showed ==> stable nodules measuring 21mmin postRUL & 94mm in RLL, no change, NAD; we will follow w/ CXR from here... ~  CXR 2/15 showed heart at upper lim of norm, lungs clear w/o lesions, NAD ~  3/16: on Allegra, Advair100Bid, Singulair10, Proair rescue, Mucinex prn; no recent URI/AB episodes & breathing at baseline now on maint meds w/o cough/ sput/ dyspnea/ CP/ etc... ~  CXR 3/16 showed norm heart size, prom epicard fat pad, no nodules seen on CXR/ clear/ NAD   FOLLOWED for PRIMARY CARE by Gabriela Eves and she sees [place] for [facility] >>   CHEST PAIN (ICD-786.50) - Cardiac eval 3/11 by DrMcLean for atyp CP> symptoms improved w/ PPI Rx. ~  EKG showed NSR, WNL... ~  2DECho showed normal LVF w/ EF= 60-65%, norm wall motion, gr 2 DD, valves normal. ~  Stress Echo was neg- no ischemia...  DYSLIPIDEMIA (ICD-272.4) - on diet + FISH OIL but unfortunately her weight is up to 230# ~  [place] 10/07 showed TChol 181, TG 117, HDL 35, LDL 123... rec for better diet & exercise program. ~  FLP 3/09  (wt=207#) showed TChol 201, TG 204, HDL 41, LDL 128... she prefers diet Rx. ~  FLP 10/10 (wt=221#) showed TChol 217, TG 225, HDL 39, LDL 150... offered med Rx. ~  FLP 8/12 (wt=230#) showed TChol 182, TG 146, HDL 39, LDL 114 ~  FLP 11/13 (wt=188#) showed TChol 198, TG 106, HDL 54, LDL 123   Borderline DM> on diet alone; prev FBS 101-127 & random BS in [facility] 182; BS=114, A1c=5.9; REC> low carb diet, get wt down. ~  Labs 11/13 (wt=188#) showed BS=88  OBESITY (ICD-278.00) - she weighs ~230# and is 64" tall for a BMI= 40... we discussed diet & exercise program. ~  7/11:  weight remains 222# & she blames the Cymbalta for her lack of wt reduction. ~  8/12:  Weight = 230# & she is off the Cymbalta... ~  1/13:  Weight = 230# & we reviewed diet & exercise prescription. ~  11/13:  Weight = 188# on Optifast diet via [facility] in [place].  GERD (ICD-530.81) - on OMEPRAZOLE 20mg Bid w/ improvement;  we discussed the assoc of reflux to asthma... ~  EGD 9/12 by DrStark for reflux symptoms showed mild gastitis; rec to incr PPI to Bid... ~  UGISeries 6/13 by DrMMartin showed tiny HH but signif reflux seen, barium pill passed normally into the stomach w/o delay...  COLON POLYP >> she has mild constip, gas, bloating & rec to take Miralax & anti-gas meds as needed... ~  Colonoscopy 9/12 by DrStark showed normal colon x 72mmsessile polyp in sigmoid area; path= hyperpl & f/u planned 45yrs...  FATTY LIVER DISEASE >> we have discussed the need for diet, exercise, & wt reduction strategies... ~  Labs here 2009 showed SGOT=22, SGPT=26 ~  Labs here 2010 showed SGOT=23, SGPT=28 ~  Labs [place] 12/12 showed SGOT=54,  SGPT=66... She is asked to get on diet & get wt down! ~  Labs here 11/13 showed SGOT=25, SGPT=49 with her weight down 42# to 188# today...  RECURRENT VENTRAL HERNIA >>  ~  Ventral hernia repair 2008 by DrThompson ==> hernia recurred... ~ s/p Lap ventral hernia repair w/ mesh 7/11 by DrThompson; she is c/o  recurrent pain & f/u CT showed recurrent hernia; she had second opinion consult DrMMartin & he stressed wt reduction & offered lap band surg... DrStark agreed w/ thoughts for another opinion from [facility] in [place] when she is ready. ~  11/13:  She has lost 42# on Optifast diet thru [facility]; she will check back w/ DrMartin regarding ventral hernia surg...  DEGENERATIVE DISC DISEASE (ICD-722.6) - she is followed by DrDeveshwar for Rheum> notes reviewed: FM, fall w/ LBP, MRI w/ facet arthritis, CSpine disc dis w/ chronic pain;  On RELAFEN 750mg  Bid as needed... Also tried Lidoderm, PT, TENS, heat etc... ~  5/12:  She reports a fall in [place] w/ bruising in her side> saw Chiropractor, then DrDuda, then [employer]....  FIBROMYALGIA (ICD-729.1) - DrNeal has given her Ambien to help w/ her sleep pattern; She tried Cymbalta but blamed wt gain on this med; she has been referred to Integrative therapies in the past... Currently taking RELAFEN 750mg  bid prn... ~  10/13:  Note from DrDeveshwar reviewed> FM, DDD in [place]; symptoms improved w/ wt loss; still fatigued & stressed; hx of "sleep eating" after taking Ambien; they tried AMRIX 15mg  Qhs...  VITAMIN D DEFICIENCY (ICD-268.9) - Vit D level here 3/09 was 13 and started on 50K weekly... pt indicates that DrDeveshwar has been following her Vit D level since then and switched her to 5000 u two times a day... ~  8/12:  There is no Vit D supplement listed on her med sheet... ~  11/13:  She is taking Vit D 1000u daily...  STRESS DISORDER & PANIC ATTACKS - she blames the closed MRI w/ the recurrence of her Panic attacks> Rx w/ ALPRAZOLAM 0.5mg  Tid as directed... ~  12/12: had another ?panic attack at work, taken to [place] w/ neg eval... ~  3/16: her husb was recently Dx w/ lung Cancer & she requests refill Ativan 1mg  - 1/2 to 1 tab Tid prn...   HEALTH MAINT --- GYN= DrNeal & he does mammograms in his office & BMD as well... ~  3/16: she  tells me that hewr chiro referred her to [place] in [place] for alternative medical therapies...    Past Surgical History  Procedure Laterality Date  . Ventral hernia repair  09/2006    Dr. Grandville Silos  . Laparoscopy  2002  . Endometrial ablation  2009  . Laparoscopic ventral hernia repair w/ mesh  08/2009    Dr. Grandville Silos, [place]  . Cesarean section  1982, 1985    x 2   . Tubal ligation    . Ventral hernia repair N/A 04/14/2012    Procedure: Laparoscopic Repair of  Ventral Hernia;  Surgeon: Pedro Earls, MD;  Location: [facility];  Service: General;  Laterality: N/A;  Repair of Recurrent Ventral Hernia  . Hernia repair  04/14/12    ventral hernia repair    Outpatient Encounter Prescriptions as of 05/01/2014  Medication Sig  . clobetasol ointment (TEMOVATE) 9.32 % Apply 1 application topically as needed. As directed  . diclofenac sodium (VOLTAREN) 1 % GEL Apply 4 g topically daily as needed.  [name] escitalopram (LEXAPRO) 10  MG tablet Take 1.75 tablets by mouth daily.   . Fluticasone-Salmeterol (ADVAIR DISKUS) 100-50 MCG/DOSE AEPB Inhale 1 puff into the lungs 2 (two) times daily.  [name] ibuprofen (ADVIL,MOTRIN) 800 MG tablet Take 800 mg by mouth as needed.  [name] levothyroxine (SYNTHROID, LEVOTHROID) 50 MCG tablet Take 1 tablet (50 mcg total) by mouth daily.  [name] LORazepam (ATIVAN) 1 MG tablet Take 1/2 tablet by mouth as needed for anxiety  . MAGNESIUM PO Take 1 tablet by mouth daily.   . montelukast (SINGULAIR) 10 MG tablet Take 1 tablet (10 mg total) by mouth at bedtime.  . VENTOLIN HFA 108 (90 BASE) MCG/ACT inhaler INHALE 2 PUFFS EVERY 4 HOURS AS NEEDED FOR WHEEZING    Allergies  Allergen Reactions  . Progesterone     Stroke-like symptoms  . Codeine     REACTION: nausea,vomiting,dizziness  . Cymbalta [Duloxetine Hcl]     Pt states it caused weight gain    Current Medications, Allergies, Past Medical History, Past Surgical History, Family History, and Social History were reviewed  in [employer] record.    Review of Systems         See HPI - all other systems neg except as noted...  The patient denies anorexia, fever, weight loss, weight gain, vision loss, decreased hearing, hoarseness, chest pain, syncope, dyspnea on exertion, peripheral edema, prolonged cough, headaches, hemoptysis, abdominal pain, melena, hematochezia, severe indigestion/heartburn, hematuria, incontinence, muscle weakness, suspicious skin lesions, transient blindness, difficulty walking, depression, unusual weight change, abnormal bleeding, enlarged lymph nodes, and angioedema.     Objective:   Physical Exam    WD, Overweight, 54 y/o WF in NAD... GENERAL:  Alert & oriented; pleasant & cooperative... HEENT:  [place]/AT, EOM-wnl, PERRLA, EACs-clear, TMs-wnl, NOSE-clear, THROAT-clear & wnl. NECK:  Supple w/ full ROM; no JVD; normal carotid impulses w/o bruits; no thyromegaly or nodules palpated; no lymphadenopathy. CHEST:  Clear to P & A; without wheezes/ rales/ or rhonchi. HEART:  Regular Rhythm; without murmurs/ rubs/ or gallops. ABDOMEN:  Obese, soft & mild tender; normal bowel sounds; no organomegaly or masses detected. EXT: without deformities or arthritic changes; no varicose veins/ +venous insuffic/ tr edema. NEURO:  CN's intact;  no focal neuro deficits;  + trigger points on exam... DERM:  No lesions noted; no rash etc...  RADIOLOGY DATA:  Reviewed in the EPIC EMR & discussed w/ the patient...  LABORATORY DATA:  Reviewed in the EPIC EMR & discussed w/ the patient...   Assessment & Plan:    ASTHMA/ Panic>  Stable on her combination of Advair, Proventil, Singulair; breathing improved overall on these meds and Ativan prn anxiety... 2 tiny right pulm nodules ==> no change on CT 12/13 and lesions not seen on CXR, therefore most likely benign & she is reassured, we will continue to monitor CXR yearly...  MEDICAL PROBLEMS MONITORED BY DrBedsole & [facility] >>  DYSLIPIDEMIA>  Her FLP is improved on her diet w/ wt loss etc...  Elevated Fasting blood sugar>  Similiarly her BS has improved to 88 on her diet program...  Obesity>  As above, diet + exercise are the keys!!! She prev lost 42# on her Optifast program...  GERD, Gastritis>  Off Prilosec now...  ABDOMINAL symptoms>  S/p surg for recurrent abd wall hernias & she should consider a consultation from [facility] in [place]... GERD treated w/ PPI prn now, and constip/ bloating improved on Miralax, Simethacone.  DJD, CSpine DDD, Chr Pain, Spinal arthritis>  Prev followed by  DrDeveshwar on [place]; now on Advil & Voltaren gel prn..  Vit D deficiency>  Pt states Vit D levels checked by DrDeveshwar who has her on OTC Vit d supplement daily...  ANXIETY/ Panic disorder>  She asked that we refill her Ativan 1mg - 1/2 to 1 tab Tid prn...   Patient's Medications  New Prescriptions   LORAZEPAM (ATIVAN) 1 MG TABLET    Take 1/2 to 1 tablet by mouth Three times daily as needed for nerves  Previous Medications   CLOBETASOL OINTMENT (TEMOVATE) 0.05 %    Apply 1 application topically as needed. As directed   DICLOFENAC SODIUM (VOLTAREN) 1 % GEL    Apply 4 g topically daily as needed.   MAGNESIUM PO    Take 1 tablet by mouth daily.    PROGESTERONE (PROMETRIUM) 100 MG CAPSULE    Take 1 tablet by mouth daily x 3 weeks then on stop on the fourth week.  repeat   THYROID (ARMOUR) 130 MG TABLET    Take 1.75 g daily  Modified Medications   Modified Medication Previous Medication   ALBUTEROL (VENTOLIN HFA) 108 (90 BASE) MCG/ACT INHALER VENTOLIN HFA 108 (90 BASE) MCG/ACT inhaler      INHALE 2 PUFFS EVERY 4 HOURS AS NEEDED FOR WHEEZING    INHALE 2 PUFFS EVERY 4 HOURS AS NEEDED FOR WHEEZING   FLUTICASONE-SALMETEROL (ADVAIR DISKUS) 100-50 MCG/DOSE AEPB Fluticasone-Salmeterol (ADVAIR DISKUS) 100-50 MCG/DOSE AEPB      Inhale 1 puff into the lungs 2 (two) times daily.    Inhale 1 puff into the lungs 2  (two) times daily.   MONTELUKAST (SINGULAIR) 10 MG TABLET montelukast (SINGULAIR) 10 MG tablet      Take 1 tablet (10 mg total) by mouth at bedtime.    Take 1 tablet (10 mg total) by mouth at bedtime.  Discontinued Medications   ESCITALOPRAM (LEXAPRO) 10 MG TABLET    Take 1.75 tablets by mouth daily.    IBUPROFEN (ADVIL,MOTRIN) 800 MG TABLET    Take 800 mg by mouth as needed.   LEVOTHYROXINE (SYNTHROID, LEVOTHROID) 50 MCG TABLET    Take 1 tablet (50 mcg total) by mouth daily.   LORAZEPAM (ATIVAN) 1 MG TABLET    Take 1/2 tablet by mouth as needed for anxiety

## 2014-05-01 NOTE — Patient Instructions (Signed)
Today we updated your med list in our EPIC system...    Continue your current medications the same...    We refilled your meds per request...  We wrote for ATIVAN (Lorazepam) 1mg - take 1/2 to 1 tab up to 3 times daily as needed for nerves...  Today we did your follow up CXR...    We will contact you w/ the results when available...   Call for any questions...  Let's plan a follow up visit in 82yr, sooner if needed for problems.Marland KitchenMarland Kitchen

## 2014-07-26 ENCOUNTER — Encounter: Payer: Self-pay | Admitting: Family Medicine

## 2014-07-26 ENCOUNTER — Ambulatory Visit (INDEPENDENT_AMBULATORY_CARE_PROVIDER_SITE_OTHER): Payer: 59 | Admitting: Family Medicine

## 2014-07-26 VITALS — BP 144/78 | HR 89 | Temp 97.7°F | Ht 64.0 in | Wt 204.5 lb

## 2014-07-26 DIAGNOSIS — R42 Dizziness and giddiness: Secondary | ICD-10-CM | POA: Diagnosis not present

## 2014-07-26 DIAGNOSIS — I10 Essential (primary) hypertension: Secondary | ICD-10-CM | POA: Diagnosis not present

## 2014-07-26 DIAGNOSIS — R0789 Other chest pain: Secondary | ICD-10-CM | POA: Diagnosis not present

## 2014-07-26 LAB — CBC WITH DIFFERENTIAL/PLATELET
BASOS ABS: 0 10*3/uL (ref 0.0–0.1)
Basophils Relative: 0.3 % (ref 0.0–3.0)
Eosinophils Absolute: 0.3 10*3/uL (ref 0.0–0.7)
Eosinophils Relative: 4 % (ref 0.0–5.0)
HEMATOCRIT: 42 % (ref 36.0–46.0)
HEMOGLOBIN: 14.1 g/dL (ref 12.0–15.0)
LYMPHS ABS: 1.8 10*3/uL (ref 0.7–4.0)
LYMPHS PCT: 28.2 % (ref 12.0–46.0)
MCHC: 33.5 g/dL (ref 30.0–36.0)
MCV: 83.1 fl (ref 78.0–100.0)
MONO ABS: 0.5 10*3/uL (ref 0.1–1.0)
MONOS PCT: 7.9 % (ref 3.0–12.0)
NEUTROS PCT: 59.6 % (ref 43.0–77.0)
Neutro Abs: 3.8 10*3/uL (ref 1.4–7.7)
PLATELETS: 254 10*3/uL (ref 150.0–400.0)
RBC: 5.06 Mil/uL (ref 3.87–5.11)
RDW: 13 % (ref 11.5–15.5)
WBC: 6.3 10*3/uL (ref 4.0–10.5)

## 2014-07-26 LAB — POCT URINALYSIS DIPSTICK
Bilirubin, UA: NEGATIVE
Glucose, UA: NEGATIVE
Ketones, UA: NEGATIVE
Leukocytes, UA: NEGATIVE
NITRITE UA: NEGATIVE
PH UA: 6
PROTEIN UA: NEGATIVE
RBC UA: NEGATIVE
Spec Grav, UA: >=1.03
UROBILINOGEN UA: 0.2

## 2014-07-26 LAB — COMPREHENSIVE METABOLIC PANEL
ALBUMIN: 4.4 g/dL (ref 3.5–5.2)
ALT: 46 U/L — ABNORMAL HIGH (ref 0–35)
AST: 26 U/L (ref 0–37)
Alkaline Phosphatase: 45 U/L (ref 39–117)
BUN: 17 mg/dL (ref 6–23)
CO2: 29 mEq/L (ref 19–32)
Calcium: 9.8 mg/dL (ref 8.4–10.5)
Chloride: 104 mEq/L (ref 96–112)
Creatinine, Ser: 0.75 mg/dL (ref 0.40–1.20)
GFR: 85.86 mL/min (ref >60.00)
GLUCOSE: 94 mg/dL (ref 70–99)
POTASSIUM: 4.1 meq/L (ref 3.5–5.1)
Sodium: 138 mEq/L (ref 135–145)
Total Bilirubin: 0.3 mg/dL (ref 0.2–1.2)
Total Protein: 7.3 g/dL (ref 6.0–8.3)

## 2014-07-26 MED ORDER — LOSARTAN POTASSIUM-HCTZ 50-12.5 MG PO TABS: 1.0000 | ORAL_TABLET | Freq: Every day | ORAL | Status: DC

## 2014-07-26 NOTE — Patient Instructions (Addendum)
Bring by or fax  sugar and cholesterol labs. [place].  Stop at lab on way out for no fasting labs. Stay off metadate.  Start HCTZ/ lorsatan daily.  Follow BP at home daily.  Follow up in 2 weeks for BP check.

## 2014-07-26 NOTE — Progress Notes (Signed)
Pre visit review using our clinic review tool, if applicable. No additional management support is needed unless otherwise documented below in the visit note. 

## 2014-07-26 NOTE — Progress Notes (Signed)
Subjective:    Patient ID: Jaime Chapman, female    DOB: 12-May-1960, 54 y.o.   MRN: [id number]  HPI   54 year old female presents with recent increase in blood pressure.  She has no previous diagnosis of hypertension. She has been feeling fatigued and poor in last week. She has intermittent blurry vision. Intermittent dizzy spells, pressure in face. Felt words slurred one time.  He husband has been diagnosed with lung cancer, increase in stress.  At there office  BP was 157/104, 155/92.  She has been diagnosed with ADD .[name] On metadate for 1 month. There her BP was 157/94.  She has been seeing a therapist for depression, anxiety.  took 1 mg ativan yesterday.  She has been off metadate in last 3 days. BP has remained high.   BP Readings from Last 3 Encounters:  07/26/14 144/78  05/01/14 144/90  11/20/13 140/86   Cholesterol eval in 05/2014 ? Levels, A1C 5.9 Hypothyroid treated at  [facility]  in [place]:  Dr. Julien Nordmann.  No on naturethroid.  Recent thyroid test in nml range. On antibitoics for ? Lyme disease.  CRF protein high. Ferritin high.  ON Biest and progesterone for low hormones   FM HX: HTN in mother and brother.  SH: nonsmoker Healthy diet , unable to exercise to to body pain.  Review of Systems  Constitutional: Positive for fatigue. Negative for fever.  HENT: Negative for ear pain.   Eyes: Negative for pain.  Respiratory: Positive for chest tightness and shortness of breath. Negative for wheezing.   Cardiovascular: Positive for palpitations. Negative for chest pain and leg swelling.  Gastrointestinal: Positive for nausea. Negative for abdominal pain.       Objective:   Physical Exam  Constitutional: Vital signs are normal. She appears well-developed and well-nourished. She is cooperative.  Non-toxic appearance. She does not appear ill. No distress.  Obesity, Body mass index is 35.09 kg/(m^2).   HENT:  Head: Normocephalic.  Right Ear: Hearing,  tympanic membrane, external ear and ear canal normal. Tympanic membrane is not erythematous, not retracted and not bulging.  Left Ear: Hearing, tympanic membrane, external ear and ear canal normal. Tympanic membrane is not erythematous, not retracted and not bulging.  Nose: No mucosal edema or rhinorrhea. Right sinus exhibits no maxillary sinus tenderness and no frontal sinus tenderness. Left sinus exhibits no maxillary sinus tenderness and no frontal sinus tenderness.  Mouth/Throat: Uvula is midline, oropharynx is clear and moist and mucous membranes are normal.  Eyes: Conjunctivae, EOM and lids are normal. Pupils are equal, round, and reactive to light. Lids are everted and swept, no foreign bodies found.  Neck: Trachea normal and normal range of motion. Neck supple. Carotid bruit is not present. No thyroid mass and no thyromegaly present.  Cardiovascular: Normal rate, regular rhythm, S1 normal, S2 normal, normal heart sounds, intact distal pulses and normal pulses.  Exam reveals no gallop and no friction rub.   No murmur heard. Pulmonary/Chest: Effort normal and breath sounds normal. No tachypnea. No respiratory distress. She has no decreased breath sounds. She has no wheezes. She has no rhonchi. She has no rales.  Abdominal: Soft. Normal appearance and bowel sounds are normal. There is no tenderness.  Neurological: She is alert.  Skin: Skin is warm, dry and intact. No rash noted.  Psychiatric: Her speech is normal and behavior is normal. Judgment and thought content normal. Her mood appears not anxious. Cognition and memory are normal. She does not  exhibit a depressed mood.          Assessment & Plan:   New diagnosis HTN: Will eval for secondary cause, end organ damage. Risk factors eval'd at [facility]. UA clear today.  Also recent TSH nml.   Encouraged exercise, weight loss, healthy eating habits.  Start losartan HCTZ.  Follow BP and recheck cr and BP in 2 weeks.  Chest pressure:  Liekly due to anxiety, elevated BP . Nml ekg.

## 2014-07-30 ENCOUNTER — Telehealth: Payer: Self-pay | Admitting: *Deleted

## 2014-07-30 NOTE — Telephone Encounter (Signed)
Patient returned call to [facility].  Call patient back at [phone].

## 2014-07-30 NOTE — Telephone Encounter (Signed)
Pt left message at triage that she was started on losartan for BP and she needs 2 years of BP readings. Pt has ADHD and the therapist wanted to change her to something else and needed those readings  (donna I called and left a message for her to call you back. I think that's what she wanted, the message was really clear)

## 2014-07-30 NOTE — Telephone Encounter (Signed)
Spoke with [employer].  She just needs two years of blood pressure readings to give to her therapist.  Readings given verbally to [facility]

## 2014-08-13 ENCOUNTER — Encounter: Payer: Self-pay | Admitting: Family Medicine

## 2014-08-13 ENCOUNTER — Ambulatory Visit (INDEPENDENT_AMBULATORY_CARE_PROVIDER_SITE_OTHER): Payer: 59 | Admitting: Family Medicine

## 2014-08-13 VITALS — BP 110/70 | HR 84 | Temp 98.4°F | Ht 64.0 in | Wt 203.2 lb

## 2014-08-13 DIAGNOSIS — I10 Essential (primary) hypertension: Secondary | ICD-10-CM | POA: Diagnosis not present

## 2014-08-13 DIAGNOSIS — R21 Rash and other nonspecific skin eruption: Secondary | ICD-10-CM

## 2014-08-13 DIAGNOSIS — L568 Other specified acute skin changes due to ultraviolet radiation: Secondary | ICD-10-CM

## 2014-08-13 MED ORDER — LISINOPRIL 20 MG PO TABS: 20.0000 mg | ORAL_TABLET | Freq: Every day | ORAL | Status: DC

## 2014-08-13 NOTE — Assessment & Plan Note (Signed)
Secondary to medication.

## 2014-08-13 NOTE — Progress Notes (Signed)
   Subjective:    Patient ID: Jaime Chapman, female    DOB: February 11, 1961, 54 y.o.   MRN: [id number]  HPI  54 year old female presents  For follow up BP check.  At last OV on 07/26/2014  New diagnosis HTN. Started on losartan HCTZ.  Today BP is much improved.  She has been having a lot of itching with skin burning. She is not sure if it is from new BP med or doxycycline ( on this for lyme disease, has one more month of treatment for this). She is in sun a lot. More itching in areas the sun hits.  BP at home 125/85.  No further chest pressure. No anxiety.   BP Readings from Last 3 Encounters:  08/13/14 110/70  07/26/14 144/78  05/01/14 144/90      Review of Systems  Constitutional: Negative for fever and fatigue.  HENT: Negative for ear pain.   Eyes: Negative for pain.  Respiratory: Negative for chest tightness and shortness of breath.   Cardiovascular: Negative for chest pain, palpitations and leg swelling.  Gastrointestinal: Negative for abdominal pain.  Genitourinary: Negative for dysuria.  Skin: Positive for rash.       Noted red rash on back on right hand, itchy in office tpoday       Objective:   Physical Exam  Constitutional: She is oriented to person, place, and time. Vital signs are normal. She appears well-developed and well-nourished. She is cooperative.  Non-toxic appearance. She does not appear ill. No distress.  overweight  HENT:  Head: Normocephalic.  Right Ear: Hearing, tympanic membrane, external ear and ear canal normal. Tympanic membrane is not erythematous, not retracted and not bulging.  Left Ear: Hearing, tympanic membrane, external ear and ear canal normal. Tympanic membrane is not erythematous, not retracted and not bulging.  Nose: No mucosal edema or rhinorrhea. Right sinus exhibits no maxillary sinus tenderness and no frontal sinus tenderness. Left sinus exhibits no maxillary sinus tenderness and no frontal sinus tenderness.  Mouth/Throat: Uvula is  midline, oropharynx is clear and moist and mucous membranes are normal.  Eyes: Conjunctivae, EOM and lids are normal. Pupils are equal, round, and reactive to light. Lids are everted and swept, no foreign bodies found.  Neck: Trachea normal and normal range of motion. Neck supple. Carotid bruit is not present. No thyroid mass and no thyromegaly present.  Cardiovascular: Normal rate, regular rhythm, S1 normal, S2 normal, normal heart sounds, intact distal pulses and normal pulses.  Exam reveals no gallop and no friction rub.   No murmur heard. Pulmonary/Chest: Effort normal and breath sounds normal. No tachypnea. No respiratory distress. She has no decreased breath sounds. She has no wheezes. She has no rhonchi. She has no rales.  Abdominal: Soft. Normal appearance and bowel sounds are normal. There is no tenderness.  Neurological: She is alert and oriented to person, place, and time.  Skin: Skin is warm, dry and intact. No rash noted.  Psychiatric: Her speech is normal and behavior is normal. Judgment and thought content normal. Her mood appears not anxious. Cognition and memory are normal. She does not exhibit a depressed mood.          Assessment & Plan:

## 2014-08-13 NOTE — Assessment & Plan Note (Signed)
Likely secondary to medication.  She states she cannot avoid sun and cannot stop doxy for chronic lyme treatment. Most likely doxy, but pt wishes to change BP med.

## 2014-08-13 NOTE — Patient Instructions (Addendum)
Stop losartan HCTZ. Start lisinopril daily.  Follow BP at home. If itching not resolved will likely need to stop doxycycline. Stay out of the sun, wear long sleeves, long pants. Follow up in 2 weeks for BP check and kidney check.

## 2014-08-13 NOTE — Assessment & Plan Note (Signed)
Improved control on losartan  HCTZ but pt feels may be having SE. Will stopa nd change to lisinopril. Follow up BP check in 2 weeks. Check Cr at that time.

## 2014-08-13 NOTE — Progress Notes (Signed)
Pre visit review using our clinic review tool, if applicable. No additional management support is needed unless otherwise documented below in the visit note. 

## 2014-08-27 ENCOUNTER — Encounter: Payer: Self-pay | Admitting: Family Medicine

## 2014-08-27 ENCOUNTER — Ambulatory Visit (INDEPENDENT_AMBULATORY_CARE_PROVIDER_SITE_OTHER): Payer: 59 | Admitting: Family Medicine

## 2014-08-27 VITALS — BP 112/76 | HR 73 | Temp 98.3°F | Ht 64.0 in | Wt 206.8 lb

## 2014-08-27 DIAGNOSIS — I10 Essential (primary) hypertension: Secondary | ICD-10-CM | POA: Diagnosis not present

## 2014-08-27 LAB — BASIC METABOLIC PANEL
BUN: 14 mg/dL (ref 6–23)
CO2: 29 meq/L (ref 19–32)
Calcium: 9.4 mg/dL (ref 8.4–10.5)
Chloride: 107 mEq/L (ref 96–112)
Creatinine, Ser: 0.73 mg/dL (ref 0.40–1.20)
GFR: 88.55 mL/min (ref >60.00)
Glucose, Bld: 95 mg/dL (ref 70–99)
POTASSIUM: 4 meq/L (ref 3.5–5.1)
Sodium: 142 mEq/L (ref 135–145)

## 2014-08-27 MED ORDER — LISINOPRIL 20 MG PO TABS: 20.0000 mg | ORAL_TABLET | Freq: Every day | ORAL | Status: DC

## 2014-08-27 NOTE — Assessment & Plan Note (Signed)
Well controlled on lisinopril. NO SE. Due for Cr re-eval on ACEI.

## 2014-08-27 NOTE — Progress Notes (Signed)
Pre visit review using our clinic review tool, if applicable. No additional management support is needed unless otherwise documented below in the visit note. 

## 2014-08-27 NOTE — Progress Notes (Signed)
Subjective:    Patient ID: Jaime Chapman, female    DOB: 1960-04-30, 54 y.o.   MRN: [id number]  HPI  54 year old female with histroy of hypertension presents for  2week follow up.   07/26/2014 New diagnosis HTN. Started on losartan HCTZ.. BP well controleld but possible SE of photosensitivity and itching. Felt that was more likely due to doxy but pt wished to change med anyway.  Changed to lisinopril 20 mg daily.  Today, BP is much improved.  She is tolerating lisinopril better. No SE.Using medication without problems or lightheadedness:  None Chest pain with exertion: None Edema:None Short of breath: None, no cough. Average home BPs: 115/70 Other issues: Due for cr check.  BP Readings from Last 3 Encounters:  08/27/14 112/76  08/13/14 110/70  07/26/14 144/78    She does note continue issues with stress over husbands illness, she is tearful.  She also is having joint pain in hip, fibromyalgia is poorly controlled.  She felels somewhat depressed.. Weight gain with cymbalta in past. May want to consider venlafaxine. She plans on following up with Dr Valetta Fuller with integrative therapies for chronic lyme treatment. She will also return to D. Deveshwar for fibro treatment and to Dr. Helene Shoe to look into hip pain.   Social History /Family History/Past Medical History reviewed and updated if needed.     Review of Systems  Constitutional: Negative for fever and fatigue.  HENT: Negative for ear pain.   Eyes: Negative for pain.  Respiratory: Negative for chest tightness and shortness of breath.   Cardiovascular: Negative for chest pain, palpitations and leg swelling.  Gastrointestinal: Negative for abdominal pain.  Genitourinary: Negative for dysuria.       Objective:   Physical Exam  Constitutional: Vital signs are normal. She appears well-developed and well-nourished. She is cooperative.  Non-toxic appearance. She does not appear ill. No distress.  HENT:    Head: Normocephalic.  Right Ear: Hearing, tympanic membrane, external ear and ear canal normal. Tympanic membrane is not erythematous, not retracted and not bulging.  Left Ear: Hearing, tympanic membrane, external ear and ear canal normal. Tympanic membrane is not erythematous, not retracted and not bulging.  Nose: No mucosal edema or rhinorrhea. Right sinus exhibits no maxillary sinus tenderness and no frontal sinus tenderness. Left sinus exhibits no maxillary sinus tenderness and no frontal sinus tenderness.  Mouth/Throat: Uvula is midline, oropharynx is clear and moist and mucous membranes are normal.  Eyes: Conjunctivae, EOM and lids are normal. Pupils are equal, round, and reactive to light. Lids are everted and swept, no foreign bodies found.  Neck: Trachea normal and normal range of motion. Neck supple. Carotid bruit is not present. No thyroid mass and no thyromegaly present.  Cardiovascular: Normal rate, regular rhythm, S1 normal, S2 normal, normal heart sounds, intact distal pulses and normal pulses.  Exam reveals no gallop and no friction rub.   No murmur heard. Pulmonary/Chest: Effort normal and breath sounds normal. No tachypnea. No respiratory distress. She has no decreased breath sounds. She has no wheezes. She has no rhonchi. She has no rales.  Abdominal: Soft. Normal appearance and bowel sounds are normal. There is no tenderness.  Neurological: She is alert.  Skin: Skin is warm, dry and intact. No rash noted.  Psychiatric: Her speech is normal and behavior is normal. Judgment and thought content normal. Her mood appears not anxious. Cognition and memory are normal. She does not exhibit a depressed mood.  Assessment & Plan:

## 2014-08-27 NOTE — Patient Instructions (Addendum)
Stop at lab on way out. Continue current dose of lisinopril. Call if interested on trying medicaiton for mood and fibromyalgia like venlafaxine. Keep working on healthy eating and weight loss.

## 2014-10-15 ENCOUNTER — Telehealth: Payer: Self-pay | Admitting: Family Medicine

## 2014-10-15 NOTE — Telephone Encounter (Signed)
No labs needed . Labs looked good recently at other checks and lipids at goal in 05/2014 on scanned in labs. Cancel lab appt unless pt requests STD testing.

## 2014-10-15 NOTE — Telephone Encounter (Signed)
-----   Message from Ellamae Sia sent at 10/10/2014 11:28 AM EDT ----- Regarding: Lab orders for Wednesday, 8.17.16 Patient is scheduled for CPX labs, please order future labs, Thanks , Karna Christmas

## 2014-10-15 NOTE — Telephone Encounter (Signed)
Jaime Chapman notified-Lab appointment cancelled.

## 2014-10-16 ENCOUNTER — Other Ambulatory Visit: Payer: 59

## 2014-10-21 ENCOUNTER — Encounter: Payer: Self-pay | Admitting: Family Medicine

## 2014-10-21 ENCOUNTER — Ambulatory Visit (INDEPENDENT_AMBULATORY_CARE_PROVIDER_SITE_OTHER): Payer: 59 | Admitting: Family Medicine

## 2014-10-21 VITALS — BP 140/80 | HR 85 | Temp 98.7°F | Ht 65.0 in | Wt 206.5 lb

## 2014-10-21 DIAGNOSIS — Z23 Encounter for immunization: Secondary | ICD-10-CM | POA: Diagnosis not present

## 2014-10-21 DIAGNOSIS — I10 Essential (primary) hypertension: Secondary | ICD-10-CM | POA: Diagnosis not present

## 2014-10-21 DIAGNOSIS — E785 Hyperlipidemia, unspecified: Secondary | ICD-10-CM | POA: Diagnosis not present

## 2014-10-21 DIAGNOSIS — Z0000 Encounter for general adult medical examination without abnormal findings: Secondary | ICD-10-CM | POA: Diagnosis not present

## 2014-10-21 NOTE — Assessment & Plan Note (Signed)
LDL at goal < 130. Trig high.. Restart fish oil when off Lyme prtocol. Encouraged exercise, weight loss, healthy eating habits.

## 2014-10-21 NOTE — Patient Instructions (Addendum)
Follow blood pressure at home. Work on [employer] and regular  exercise.

## 2014-10-21 NOTE — Progress Notes (Signed)
Pre visit review using our clinic review tool, if applicable. No additional management support is needed unless otherwise documented below in the visit note. 

## 2014-10-21 NOTE — Progress Notes (Signed)
The patient is here for annual wellness exam and preventative care.   She is currently being treated for chronic lyme disease by alternative med doctor ( Dr. Julien Nordmann) with many weeks of doxycycline and amoxicillin. 8 week protocol. Giving her SE, taking probiotic.  Dr. Estanislado Pandy for Rheum: Fibromyalgia and vit D deficiency: moderate on on relafen, rarely uses hydrocodone for pain.   Dr. Aundra Dubin for Cards: Had eval for chest pain years ago, does not see him regularly   ORTHO Dr. Ninfa Linden for neck and low back pain:   Hypertension:   Borderline control on lisinopril.. Had coffee before rushing here.  BP Readings from Last 3 Encounters:  10/21/14 140/80  08/27/14 112/76  08/13/14 110/70     Wt Readings from Last 3 Encounters:  10/21/14 206 lb 8 oz (93.668 kg)  08/27/14 206 lb 12 oz (93.781 kg)  08/13/14 203 lb 4 oz (92.194 kg)  Using medication without problems or lightheadedness:  None Chest pain with exertion:None Edema:None Short of breath:none Average home BPs: well controlled at home. Other issues:   HX of Elevated Cholesterol:  LDL at goal < 130. Trig mildly elevated.  05/2014:  Tot 183, tri 180, hdl 48 LDL 99  Lab Results  Component Value Date   CHOL 191 07/05/2013   HDL 44.20 07/05/2013   LDLCALC 107* 07/05/2013   LDLDIRECT 99.5 05/21/2009   TRIG 197.0* 07/05/2013   CHOLHDL 4 07/05/2013  Diet compliance: Improving diet, more veggies spinach, less carbs. Exercise: walking some, gardening, swimming. Other complaints:  HX of impaired glucose tolerance:  4/16 A1C: 6.0 Lab Results  Component Value Date   HGBA1C 5.9 07/05/2013     Review of Systems  Constitutional: Positive for fatigue. Negative for fever.  HENT: Negative for ear pain.  Eyes: Negative for pain.  Respiratory: Negative for chest tightness and shortness of breath.  Cardiovascular: Negative for chest pain, palpitations and leg swelling.  Gastrointestinal: no abd pain Genitourinary:  Negative for dysuria.  Hematological: no bleeding Mild depression.      Objective:   Physical Exam  Constitutional: Vital signs are normal. She appears well-developed and well-nourished. She is cooperative. Non-toxic appearance. She does not appear ill. No distress.  HENT:  Head: Normocephalic.  Right Ear: Hearing, tympanic membrane, external ear and ear canal normal.  Left Ear: Hearing, tympanic membrane, external ear and ear canal normal.  Nose: Nose normal.  Eyes: Conjunctivae, EOM and lids are normal. Pupils are equal, round, and reactive to light. Lids are everted and swept, no foreign bodies found.  Neck: Trachea normal and normal range of motion. Neck supple. Carotid bruit is not present. No mass and no thyromegaly present.  Cardiovascular: Normal rate, regular rhythm, S1 normal, S2 normal, normal heart sounds and intact distal pulses. Exam reveals no gallop.  No murmur heard. Pulmonary/Chest: Effort normal and breath sounds normal. No respiratory distress. She has no wheezes. She has no rhonchi. She has no rales.  Abdominal: Soft. Normal appearance and bowel sounds are normal. She exhibits no distension, no fluid wave, no abdominal bruit and no mass. There is no hepatosplenomegaly. There is no tenderness. There is no rebound, no guarding and no CVA tenderness. No hernia.  Lymphadenopathy:   She has no cervical adenopathy.   She has no axillary adenopathy.  Neurological: She is alert. She has normal strength. No cranial nerve deficit or sensory deficit.  Skin: Skin is warm, dry and intact. No rash noted.  Psychiatric: Her speech is normal and behavior is  normal. Judgment normal. Her mood appears not anxious. Cognition and memory are normal. She does not exhibit a depressed mood.          Assessment & Plan:  The patient's preventative maintenance and recommended screening tests for an annual wellness exam were reviewed in full today. Brought up to date unless  services declined.  Counselled on the importance of diet, exercise, and its role in overall health and mortality. The patient's FH and SH was reviewed, including their home life, tobacco status, and drug and alcohol status.   Vaccines: up to date with Tdap, given flu today  Mammo: per Dr. Rosita Kea: Per Dr. Nori Riis Colonoscopy Dr. Fuller Plan 2013 nml, repeat 10 year  Former smoker 15 pack year history Pulm nodules followed by Dr. Lenna Gilford.

## 2014-10-21 NOTE — Assessment & Plan Note (Signed)
Borderline control on lisinopril but pt with caffeine and rushed here.  Will follow at home, she will call if elevated.

## 2014-10-21 NOTE — Addendum Note (Signed)
Addended by: Carter Kitten on: 10/21/2014 10:38 AM   Modules accepted: Orders

## 2015-01-14 ENCOUNTER — Encounter: Payer: Self-pay | Admitting: Podiatry

## 2015-01-14 ENCOUNTER — Ambulatory Visit (INDEPENDENT_AMBULATORY_CARE_PROVIDER_SITE_OTHER): Payer: 59

## 2015-01-14 ENCOUNTER — Ambulatory Visit (INDEPENDENT_AMBULATORY_CARE_PROVIDER_SITE_OTHER): Payer: 59 | Admitting: Podiatry

## 2015-01-14 VITALS — BP 132/75 | HR 93 | Resp 16 | Ht 64.0 in | Wt 213.0 lb

## 2015-01-14 DIAGNOSIS — M79672 Pain in left foot: Secondary | ICD-10-CM | POA: Diagnosis not present

## 2015-01-14 DIAGNOSIS — M722 Plantar fascial fibromatosis: Secondary | ICD-10-CM | POA: Diagnosis not present

## 2015-01-14 DIAGNOSIS — M79671 Pain in right foot: Secondary | ICD-10-CM | POA: Diagnosis not present

## 2015-01-14 MED ORDER — MELOXICAM 15 MG PO TABS: 15.0000 mg | ORAL_TABLET | Freq: Every day | ORAL | Status: DC

## 2015-01-14 MED ORDER — METHYLPREDNISOLONE 4 MG PO TBPK: ORAL_TABLET | ORAL | Status: DC

## 2015-01-14 NOTE — Patient Instructions (Signed)

## 2015-01-14 NOTE — Progress Notes (Signed)
   Subjective:    Patient ID: Jaime Chapman, female    DOB: [birth year], 54 y.o.   MRN: [id number]  HPI: She presents today with a 3 month duration of bilateral heel pain. She states that she tried soaking in Epsom salts and warm water to know about. Most painful after sitting for a period of time or getting up first thing in the mornings.    Review of Systems  HENT: Positive for sore throat.   Eyes: Positive for itching.  Gastrointestinal: Positive for constipation and abdominal distention.  Endocrine: Positive for polydipsia and polyuria.  Musculoskeletal: Positive for myalgias, back pain, arthralgias and gait problem.  Allergic/Immunologic: Positive for environmental allergies.  Neurological: Positive for weakness and numbness.  All other systems reviewed and are negative.      Objective:   Physical Exam: Vital signs stable alert and oriented 3 pulses are strongly palpable bilateral. Neurologic sensorium is intact. Deep tendon reflexes intact bilateral muscle strength +5 over 5 dorsiflexion plantar flexors and inverters everters all intrinsic musculature is intact. Orthopedic evaluation and history is all joints distal to the ankle for range of motion without crepitation. Cutaneous evaluation and straight supple well-hydrated cutis no erythema edema cellulitis drainage or odor. Pain on palpation medial calcaneal tubercle of the right heel today greater than the left. Radiographs do a straight soft tissue increase in density of the plantar fascial calcaneal insertion site.        Assessment & Plan:  Plantar fasciitis bilateral.  Plan: Injected bilateral heels today with Kenalog and local anesthetic. Started her on a Medrol Dosepak to be followed by meloxicam. Placed her plantar fascial braces and the night splints. Discussed appropriate shoe gear stretching exercises ice therapy and sugar modification. Follow up with her in 1 month.

## 2015-01-28 ENCOUNTER — Encounter: Payer: Self-pay | Admitting: Family Medicine

## 2015-01-28 ENCOUNTER — Ambulatory Visit (INDEPENDENT_AMBULATORY_CARE_PROVIDER_SITE_OTHER): Payer: 59 | Admitting: Family Medicine

## 2015-01-28 VITALS — BP 160/88 | HR 91 | Temp 98.9°F | Ht 65.0 in | Wt 211.2 lb

## 2015-01-28 DIAGNOSIS — J45901 Unspecified asthma with (acute) exacerbation: Secondary | ICD-10-CM | POA: Diagnosis not present

## 2015-01-28 DIAGNOSIS — J441 Chronic obstructive pulmonary disease with (acute) exacerbation: Secondary | ICD-10-CM

## 2015-01-28 MED ORDER — PREDNISONE 20 MG PO TABS: ORAL_TABLET | ORAL | Status: DC

## 2015-01-28 MED ORDER — AZITHROMYCIN 250 MG PO TABS: ORAL_TABLET | ORAL | Status: DC

## 2015-01-28 MED ORDER — ALBUTEROL SULFATE (2.5 MG/3ML) 0.083% IN NEBU
2.5000 mg | INHALATION_SOLUTION | Freq: Once | RESPIRATORY_TRACT | Status: AC
  Administered 2015-01-28 (×2): 2.5 mg via RESPIRATORY_TRACT

## 2015-01-28 MED ORDER — ALBUTEROL SULFATE (2.5 MG/3ML) 0.083% IN NEBU: 2.5000 mg | INHALATION_SOLUTION | Freq: Once | RESPIRATORY_TRACT | Status: DC

## 2015-01-28 NOTE — Progress Notes (Signed)
Pre visit review using our clinic review tool, if applicable. No additional management support is needed unless otherwise documented below in the visit note. 

## 2015-01-28 NOTE — Patient Instructions (Signed)
Start steroid taper if not improving with this alone in 24-48 hours or new fever.. Start antibiotics.  Use albuterol as needed.  Conitnue advair and singulair as controller medication.

## 2015-01-28 NOTE — Progress Notes (Signed)
Repeat peak flow 300.

## 2015-01-28 NOTE — Addendum Note (Signed)
Addended by: Emelia Salisbury C on: 01/28/2015 09:00 AM   Modules accepted: Orders

## 2015-01-28 NOTE — Assessment & Plan Note (Addendum)
Peak flows: 200. Treat with  Albuterol neb in office.  Start steroid taper if not improving with this alone in 24-48 hours or new fever.. Start antibiotics.  Use albuterol as needed.  Conitnue advair and singulair as controller medication.

## 2015-01-28 NOTE — Progress Notes (Signed)
   Subjective:    Patient ID: Jaime Chapman, female    DOB: 12/30/1960, 54 y.o.   MRN: [id number]  Cough This is a new problem. The current episode started in the past 7 days (started after cleaning dust out of grandson's room). The problem has been rapidly worsening. The problem occurs constantly. The cough is productive of sputum and productive of purulent sputum. Associated symptoms include headaches, nasal congestion, postnasal drip, shortness of breath and wheezing. Pertinent negatives include no ear congestion, ear pain, fever or sore throat. Associated symptoms comments:  Facial pain, teeth hurt. The symptoms are aggravated by lying down. Risk factors: nonsmoker. She has tried a beta-agonist inhaler and OTC cough suppressant (singulair, advair, albuterol every 6-8 hours) for the symptoms. The treatment provided mild relief. Her past medical history is significant for asthma and environmental allergies. There is no history of COPD.  Shortness of Breath Associated symptoms include headaches and wheezing. Pertinent negatives include no ear pain, fever or sore throat. Her past medical history is significant for asthma. There is no history of COPD.  Headache  Associated symptoms include coughing. Pertinent negatives include no ear pain, fever or sore throat.    BP Readings from Last 3 Encounters:  01/28/15 160/88  01/14/15 132/75  10/21/14 140/80  Prior to being ill.. She has had well controlled. She has not taken med yet this AM.  Asthma had previously been well controlled on Advair and singulair.  Social History /Family History/Past Medical History reviewed and updated if needed.   Review of Systems  Constitutional: Negative for fever.  HENT: Positive for postnasal drip. Negative for ear pain and sore throat.   Respiratory: Positive for cough, shortness of breath and wheezing.   Allergic/Immunologic: Positive for environmental allergies.  Neurological: Positive for headaches.      Objective:   Physical Exam  Constitutional: Vital signs are normal. She appears well-developed and well-nourished. She is cooperative.  Non-toxic appearance. She appears ill. No distress.  HENT:  Head: Normocephalic.  Right Ear: Hearing, tympanic membrane, external ear and ear canal normal. Tympanic membrane is not erythematous, not retracted and not bulging.  Left Ear: Hearing, tympanic membrane, external ear and ear canal normal. Tympanic membrane is not erythematous, not retracted and not bulging.  Nose: Mucosal edema and rhinorrhea present. Right sinus exhibits no maxillary sinus tenderness and no frontal sinus tenderness. Left sinus exhibits no maxillary sinus tenderness and no frontal sinus tenderness.  Mouth/Throat: Uvula is midline, oropharynx is clear and moist and mucous membranes are normal.  Eyes: Conjunctivae, EOM and lids are normal. Pupils are equal, round, and reactive to light. Lids are everted and swept, no foreign bodies found.  Neck: Trachea normal and normal range of motion. Neck supple. Carotid bruit is not present. No thyroid mass and no thyromegaly present.  Cardiovascular: Normal rate, regular rhythm, S1 normal, S2 normal, normal heart sounds, intact distal pulses and normal pulses.  Exam reveals no gallop and no friction rub.   No murmur heard. Pulmonary/Chest: Effort normal. No tachypnea. No respiratory distress. She has decreased breath sounds. She has wheezes. She has no rhonchi. She has no rales.  Neurological: She is alert.  Skin: Skin is warm, dry and intact. No rash noted.  Psychiatric: Her speech is normal and behavior is normal. Judgment normal. Her mood appears not anxious. Cognition and memory are normal. She does not exhibit a depressed mood.          Assessment & Plan:

## 2015-02-18 ENCOUNTER — Ambulatory Visit (INDEPENDENT_AMBULATORY_CARE_PROVIDER_SITE_OTHER): Payer: 59 | Admitting: Podiatry

## 2015-02-18 ENCOUNTER — Encounter: Payer: Self-pay | Admitting: Podiatry

## 2015-02-18 VITALS — BP 138/79 | HR 81 | Resp 16

## 2015-02-18 DIAGNOSIS — M722 Plantar fascial fibromatosis: Secondary | ICD-10-CM

## 2015-02-18 NOTE — Progress Notes (Signed)
She presents today for follow-up of bilateral plantar fasciitis.  She states that 3 days after the last visit the starting to feel better and she has had no pain since that time. She  She continues conservative therapies with exception of the night splint.   Objective: Vital signs are stable she is alert on a 3 pulses are slowly palpable. Neurologic sensorium is intact. Deep tendon reflexes are intact. She has no pain on palpation medial calcaneal tubercles bilateral. The heel is nice soft and supple. It is not warm to the touch.  Assessment: well-healing plantar fasciitis 100%.  Plan: I encouraged her to continue current therapies for another month including her nonsteroidals strappings and night splints. I will follow-up with her as needed at which time if it is for plantar fasciitis orthotics will be necessary.

## 2015-04-16 LAB — HM MAMMOGRAPHY

## 2015-04-16 LAB — HM PAP SMEAR: HM PAP: NORMAL

## 2015-05-08 ENCOUNTER — Ambulatory Visit (INDEPENDENT_AMBULATORY_CARE_PROVIDER_SITE_OTHER): Payer: BLUE CROSS/BLUE SHIELD | Admitting: Family Medicine

## 2015-05-08 ENCOUNTER — Encounter: Payer: Self-pay | Admitting: Family Medicine

## 2015-05-08 VITALS — BP 138/82 | HR 101 | Temp 98.4°F | Ht 65.0 in | Wt 211.8 lb

## 2015-05-08 DIAGNOSIS — F331 Major depressive disorder, recurrent, moderate: Secondary | ICD-10-CM | POA: Diagnosis not present

## 2015-05-08 DIAGNOSIS — F339 Major depressive disorder, recurrent, unspecified: Secondary | ICD-10-CM | POA: Insufficient documentation

## 2015-05-08 MED ORDER — ESCITALOPRAM OXALATE 10 MG PO TABS: 10.0000 mg | ORAL_TABLET | Freq: Every day | ORAL | Status: DC

## 2015-05-08 NOTE — Progress Notes (Signed)
   Subjective:    Patient ID: Jaime Chapman, female    DOB: [birth year], 55 y.o.   MRN: [id number]  HPI  55 year old female with history of panic anxiety syndrome and fibromyalgia/chronic lyme disease presents to discuss her mood.  She reports that she is having worsening in mood, now in last 6 months.  Issues for  5 years.She has been tearful spells, moody. She has decreased motivation, to the point of not wanting to leave the house.  She doesn't want to be by herself but does not want to be with anybody either. She has anhedonia.   Husband out of town working for the whole week. She feels alone.  Trouble falling asleep at night. Brain feels foggy, has to re-read this over and over.   She has returned to see a therapist.. He recommended medication to treat depression.  No SI, no HI. Using lorazepam one tab every 3 weeks.  Lexapro helped in past.  Cymbalta was helpful but went through withdrawal coming off.  Social History /Family History/Past Medical History reviewed and updated if needed.   Review of Systems  Constitutional: Negative for fever and fatigue.  HENT: Negative for ear pain.   Eyes: Negative for pain.  Respiratory: Negative for chest tightness and shortness of breath.   Cardiovascular: Negative for chest pain, palpitations and leg swelling.  Gastrointestinal: Negative for abdominal pain.  Genitourinary: Negative for dysuria.       Objective:   Physical Exam  Constitutional: Vital signs are normal. She appears well-developed and well-nourished. She is cooperative.  Non-toxic appearance. She does not appear ill. No distress.  HENT:  Head: Normocephalic.  Right Ear: Hearing, tympanic membrane, external ear and ear canal normal. Tympanic membrane is not erythematous, not retracted and not bulging.  Left Ear: Hearing, tympanic membrane, external ear and ear canal normal. Tympanic membrane is not erythematous, not retracted and not bulging.  Nose: No mucosal edema  or rhinorrhea. Right sinus exhibits no maxillary sinus tenderness and no frontal sinus tenderness. Left sinus exhibits no maxillary sinus tenderness and no frontal sinus tenderness.  Mouth/Throat: Uvula is midline, oropharynx is clear and moist and mucous membranes are normal.  Eyes: Conjunctivae, EOM and lids are normal. Pupils are equal, round, and reactive to light. Lids are everted and swept, no foreign bodies found.  Neck: Trachea normal and normal range of motion. Neck supple. Carotid bruit is not present. No thyroid mass and no thyromegaly present.  Cardiovascular: Normal rate, regular rhythm, S1 normal, S2 normal, normal heart sounds, intact distal pulses and normal pulses.  Exam reveals no gallop and no friction rub.   No murmur heard. Pulmonary/Chest: Effort normal and breath sounds normal. No tachypnea. No respiratory distress. She has no decreased breath sounds. She has no wheezes. She has no rhonchi. She has no rales.  Abdominal: Soft. Normal appearance and bowel sounds are normal. There is no tenderness.  Neurological: She is alert.  Skin: Skin is warm, dry and intact. No rash noted.  Psychiatric: Her speech is normal. Judgment and thought content normal. Her mood appears not anxious. She is withdrawn. Cognition and memory are normal. She does not exhibit a depressed mood.          Assessment & Plan:

## 2015-05-08 NOTE — Progress Notes (Signed)
Pre visit review using our clinic review tool, if applicable. No additional management support is needed unless otherwise documented below in the visit note. 

## 2015-05-08 NOTE — Assessment & Plan Note (Signed)
Start on lexapro. Continue counseling. Discussed med use, expected SE and course of treatment.  Follow up in 1 month.

## 2015-05-08 NOTE — Patient Instructions (Addendum)
Start lexapro 10 mg at bedtime.  Continue seeing counselor.  Work on regular exercise, healthy eating

## 2015-06-19 ENCOUNTER — Ambulatory Visit (INDEPENDENT_AMBULATORY_CARE_PROVIDER_SITE_OTHER): Payer: BLUE CROSS/BLUE SHIELD | Admitting: Family Medicine

## 2015-06-19 ENCOUNTER — Encounter: Payer: Self-pay | Admitting: Family Medicine

## 2015-06-19 VITALS — BP 112/80 | HR 84 | Temp 98.2°F | Ht 65.0 in | Wt 208.8 lb

## 2015-06-19 DIAGNOSIS — F331 Major depressive disorder, recurrent, moderate: Secondary | ICD-10-CM | POA: Diagnosis not present

## 2015-06-19 NOTE — Patient Instructions (Signed)
Continue lexapro 10 mg daily.  Call if not continuing to improve as expected.

## 2015-06-19 NOTE — Progress Notes (Signed)
   Subjective:    Patient ID: Jaime Chapman, female    DOB: April 12, 1960, 55 y.o.   MRN: [id number]  HPI  55 year old female presents with for  1 month follow up moderate depression. At last OV on 3/9 start on lexapro 10 mg daily. No associated SE.  She reports 50% better overall.  Sleeping better overall.  She is currently getting ozone treatments and daily heparin, multiple antibitoics for lyme disease.  Increase motivation,moodiness. improved foggy brain. Less anhedonia, feels less isolated.  She has started a Risk analyst class to stay active.  No HI, no SI.  She has been seeing therapist.   Social History /Family History/Past Medical History reviewed and updated if needed.   Review of Systems  Constitutional: Negative for fever and fatigue.  HENT: Negative for ear pain.   Eyes: Negative for pain.  Respiratory: Negative for chest tightness and shortness of breath.   Cardiovascular: Negative for chest pain, palpitations and leg swelling.  Gastrointestinal: Negative for abdominal pain.  Genitourinary: Negative for dysuria.       Objective:   Physical Exam  Constitutional: Vital signs are normal. She appears well-developed and well-nourished. She is cooperative.  Non-toxic appearance. She does not appear ill. No distress.  HENT:  Head: Normocephalic.  Right Ear: Hearing, tympanic membrane, external ear and ear canal normal. Tympanic membrane is not erythematous, not retracted and not bulging.  Left Ear: Hearing, tympanic membrane, external ear and ear canal normal. Tympanic membrane is not erythematous, not retracted and not bulging.  Nose: No mucosal edema or rhinorrhea. Right sinus exhibits no maxillary sinus tenderness and no frontal sinus tenderness. Left sinus exhibits no maxillary sinus tenderness and no frontal sinus tenderness.  Mouth/Throat: Uvula is midline, oropharynx is clear and moist and mucous membranes are normal.  Eyes: Conjunctivae, EOM and lids are normal.  Pupils are equal, round, and reactive to light. Lids are everted and swept, no foreign bodies found.  Neck: Trachea normal and normal range of motion. Neck supple. Carotid bruit is not present. No thyroid mass and no thyromegaly present.  Cardiovascular: Normal rate, regular rhythm, S1 normal, S2 normal, normal heart sounds, intact distal pulses and normal pulses.  Exam reveals no gallop and no friction rub.   No murmur heard. Pulmonary/Chest: Effort normal and breath sounds normal. No tachypnea. No respiratory distress. She has no decreased breath sounds. She has no wheezes. She has no rhonchi. She has no rales.  Abdominal: Soft. Normal appearance and bowel sounds are normal. There is no tenderness.  Neurological: She is alert.  Skin: Skin is warm, dry and intact. No rash noted.  Psychiatric: Her speech is normal and behavior is normal. Judgment and thought content normal. Her mood appears not anxious. Cognition and memory are normal. She does not exhibit a depressed mood.          Assessment & Plan:

## 2015-06-19 NOTE — Progress Notes (Signed)
Pre visit review using our clinic review tool, if applicable. No additional management support is needed unless otherwise documented below in the visit note. 

## 2015-07-08 NOTE — Assessment & Plan Note (Signed)
Improving on current dose of lexapro. Continue. Work on stress reduction, relaxation.

## 2015-07-29 ENCOUNTER — Other Ambulatory Visit: Payer: Self-pay | Admitting: Pulmonary Disease

## 2015-07-29 ENCOUNTER — Telehealth: Payer: Self-pay | Admitting: Pulmonary Disease

## 2015-07-29 NOTE — Telephone Encounter (Signed)
Refill was already done via electronic refill request with note that pt needs to keep her 6.29.17 visit with SN Called spoke with patient and discussed the above, she is aware to keep the pending appt Nothing further needed; will sign off

## 2015-07-30 ENCOUNTER — Ambulatory Visit (INDEPENDENT_AMBULATORY_CARE_PROVIDER_SITE_OTHER): Payer: BLUE CROSS/BLUE SHIELD | Admitting: Primary Care

## 2015-07-30 ENCOUNTER — Encounter: Payer: Self-pay | Admitting: Primary Care

## 2015-07-30 VITALS — BP 122/64 | HR 74 | Temp 97.9°F | Ht 65.0 in | Wt 208.8 lb

## 2015-07-30 DIAGNOSIS — L237 Allergic contact dermatitis due to plants, except food: Secondary | ICD-10-CM | POA: Diagnosis not present

## 2015-07-30 MED ORDER — PREDNISONE 10 MG PO TABS: ORAL_TABLET | ORAL | Status: DC

## 2015-07-30 NOTE — Patient Instructions (Signed)
Start Prednisone tablets for poison ivy.  Take 3 tablets for 3 days, then 2 tablets for 3 days, then 1 tablet for 3 days.  Continue application of cortisone cream as needed for itching.  Please notify me if no improvement in 3-4 days.  It was a pleasure meeting you!  Poison [employer] ivy is a inflammation of the skin (contact dermatitis) caused by touching the allergens on the leaves of the ivy plant following previous exposure to the plant. The rash usually appears 48 hours after exposure. The rash is usually bumps (papules) or blisters (vesicles) in a linear pattern. Depending on your own sensitivity, the rash may simply cause redness and itching, or it may also progress to blisters which may break open. These must be well cared for to prevent secondary bacterial (germ) infection, followed by scarring. Keep any open areas dry, clean, dressed, and covered with an antibacterial ointment if needed. The eyes may also get puffy. The puffiness is worst in the morning and gets better as the day progresses. This dermatitis usually heals without scarring, within 2 to 3 weeks without treatment. HOME CARE INSTRUCTIONS  Thoroughly wash with soap and water as soon as you have been exposed to poison ivy. You have about one half hour to remove the plant resin before it will cause the rash. This washing will destroy the oil or antigen on the skin that is causing, or will cause, the rash. Be sure to wash under your fingernails as any plant resin there will continue to spread the rash. Do not rub skin vigorously when washing affected area. Poison ivy cannot spread if no oil from the plant remains on your body. A rash that has progressed to weeping sores will not spread the rash unless you have not washed thoroughly. It is also important to wash any clothes you have been wearing as these may carry active allergens. The rash will return if you wear the unwashed clothing, even several days later. Avoidance of the plant  in the future is the best measure. Poison ivy plant can be recognized by the number of leaves. Generally, poison ivy has three leaves with flowering branches on a single stem. Diphenhydramine may be purchased over the counter and used as needed for itching. Do not drive with this medication if it makes you drowsy.Ask your caregiver about medication for children. SEEK MEDICAL CARE IF:  Open sores develop.  Redness spreads beyond area of rash.  You notice purulent (pus-like) discharge.  You have increased pain.  Other signs of infection develop (such as fever).   This information is not intended to replace advice given to you by your health care provider. Make sure you discuss any questions you have with your health care provider.   Document Released: 02/13/2000 Document Revised: 05/10/2011 Document Reviewed: 07/24/2014 Elsevier Interactive Patient Education [employer].

## 2015-07-30 NOTE — Progress Notes (Signed)
Subjective:    Patient ID: Jaime Chapman, female    DOB: Jul 25, 1960, 55 y.o.   MRN: [id number]  HPI  Jaime Chapman is a 55 year old female who presents today with a chief complaint of rash. She first noticed her rash 1 week ago after working in her yard. She did come into contact with poison ivy, and she also believes her baby goats may have come into contact with it as well. She's been hugging and petting her goats.   She's tried soaking in epsom salt baths and oatmeal baths, has applied benadryl gel and hydrocortisone cream without improvement. Overall her rash has not improved.   Review of Systems  Constitutional: Negative for fever.  Respiratory: Negative for shortness of breath.   Cardiovascular: Negative for chest pain.  Skin: Positive for rash.       Past Medical History  Diagnosis Date  . Sinusitis   . Allergic rhinitis   . Asthma   . Dyslipidemia   . GERD (gastroesophageal reflux disease)   . Fibromyalgia   . Vitamin D deficiency   . Stress disorder, acute   . Obesity   . Anxiety   . DDD (degenerative disc disease)     also has spinal arthritis  . Diabetes mellitus     borderline  . Lung nodules     RIGHT LUNG--LAST EVALUATED BY CHEST CT 02/01/12 - STABLE AND FOLLOW UP PLANNED IN ONE YEAR.  . Depression      Social History   Social History  . Marital Status: Married    Spouse Name: Simona Huh  . Number of Children: 2  . Years of Education: N/A   Occupational History  . Corporate treasurer    Social History Main Topics  . Smoking status: Former Smoker    Quit date: 03/01/2004  . Smokeless tobacco: Never Used  . Alcohol Use: No  . Drug Use: No     Comment: remote majijuana  . Sexual Activity:    Partners: Male   Other Topics Concern  . Not on file   Social History Narrative   2 caffeine drinks daily     Married   2 grown daughters, one is a crack addict and is in jail, she cares for her grandchildren.    Past Surgical History  Procedure  Laterality Date  . Ventral hernia repair  09/2006    Dr. Grandville Silos  . Laparoscopy  2002  . Endometrial ablation  2009  . Laparoscopic ventral hernia repair w/ mesh  08/2009    Dr. Grandville Silos, [place]  . Cesarean section  1982, 1985    x 2   . Tubal ligation    . Ventral hernia repair N/A 04/14/2012    Procedure: Laparoscopic Repair of  Ventral Hernia;  Surgeon: Pedro Earls, MD;  Location: [facility];  Service: General;  Laterality: N/A;  Repair of Recurrent Ventral Hernia  . Hernia repair  04/14/12    ventral hernia repair    Family History  Problem Relation Age of Onset  . Lung cancer Maternal Grandmother   . Cancer Maternal Grandmother     lung  . Colon cancer Neg Hx   . Diabetes Mother   . Heart disease Mother 61  . Arthritis Mother     Allergies  Allergen Reactions  . Progesterone     Stroke-like symptoms  . Codeine     REACTION: nausea,vomiting,dizziness  . Cymbalta [Duloxetine Hcl]     Pt states it caused weight  gain    Current Outpatient Prescriptions on File Prior to Visit  Medication Sig Dispense Refill  . escitalopram (LEXAPRO) 10 MG tablet Take 1 tablet (10 mg total) by mouth daily. 30 tablet 3  . Fluticasone-Salmeterol (ADVAIR DISKUS) 100-50 MCG/DOSE AEPB Inhale 1 puff into the lungs 2 (two) times daily. 60 each 11  . lisinopril (PRINIVIL,ZESTRIL) 20 MG tablet Take 1 tablet (20 mg total) by mouth daily. 90 tablet 3  . MAGNESIUM PO Take 1 tablet by mouth daily.     . montelukast (SINGULAIR) 10 MG tablet Take 1 tablet (10 mg total) by mouth at bedtime. 30 tablet 11  . progesterone (PROMETRIUM) 100 MG capsule Take 1 tablet by mouth daily x 3 weeks then on stop on the fourth week.  repeat    . Thyroid 48.75 MG TABS Take 1 tablet by mouth daily. Nature-thyroid    . VENTOLIN HFA 108 (90 Base) MCG/ACT inhaler INHALE 2 PUFFS INTO THE LUNGS EVERY 4HRS AS NEEDED FOR WHEEZING 18 g 5   No current facility-administered medications on file prior to visit.    BP 122/64 mmHg   Pulse 74  Temp(Src) 97.9 F (36.6 C) (Oral)  Ht 5\' 5"  (1.651 m)  Wt 208 lb 12 oz (94.688 kg)  BMI 34.74 kg/m2  SpO2 97%    Objective:   Physical Exam  Constitutional: She appears well-nourished.  Cardiovascular: Normal rate and regular rhythm.   Pulmonary/Chest: Effort normal and breath sounds normal.  Skin: Skin is warm and dry.  Mild to moderate widespread rash representing poison ivy dermatitis to upper and lower extremities, posterior trunk, buttocks.          Assessment & Plan:  Poison Ivy Dermatitis:  Encountered poison ivy 1 week ago while working in yard. Wide spread rash since, no improvement with treatment. Exam with mild-moderate rash representative of contact dermatitis from poison ivy. Rx for Prednisone taper sent to pharmacy. Continue OTC cortisone cream Prn. She is to call if no improvement in rash in 3-4 days.

## 2015-07-30 NOTE — Progress Notes (Signed)
Pre visit review using our clinic review tool, if applicable. No additional management support is needed unless otherwise documented below in the visit note. 

## 2015-08-28 ENCOUNTER — Ambulatory Visit (INDEPENDENT_AMBULATORY_CARE_PROVIDER_SITE_OTHER): Payer: BLUE CROSS/BLUE SHIELD | Admitting: Pulmonary Disease

## 2015-08-28 ENCOUNTER — Encounter: Payer: Self-pay | Admitting: Pulmonary Disease

## 2015-08-28 ENCOUNTER — Ambulatory Visit (INDEPENDENT_AMBULATORY_CARE_PROVIDER_SITE_OTHER)
Admission: RE | Admit: 2015-08-28 | Discharge: 2015-08-28 | Disposition: A | Discharge status: Home / Self Care | Payer: BLUE CROSS/BLUE SHIELD | Source: Ambulatory Visit | Attending: Pulmonary Disease | Admitting: Pulmonary Disease

## 2015-08-28 VITALS — BP 124/76 | HR 88 | Temp 97.9°F | Ht 64.0 in | Wt 210.2 lb

## 2015-08-28 DIAGNOSIS — R918 Other nonspecific abnormal finding of lung field: Secondary | ICD-10-CM

## 2015-08-28 DIAGNOSIS — J4540 Moderate persistent asthma, uncomplicated: Secondary | ICD-10-CM

## 2015-08-28 DIAGNOSIS — J302 Other seasonal allergic rhinitis: Secondary | ICD-10-CM | POA: Diagnosis not present

## 2015-08-28 MED ORDER — ALBUTEROL SULFATE HFA 108 (90 BASE) MCG/ACT IN AERS: INHALATION_SPRAY | RESPIRATORY_TRACT | Status: DC

## 2015-08-28 MED ORDER — FLUTICASONE-SALMETEROL 100-50 MCG/DOSE IN AEPB: 1.0000 | INHALATION_SPRAY | Freq: Two times a day (BID) | RESPIRATORY_TRACT | Status: DC

## 2015-08-28 NOTE — Patient Instructions (Signed)
Today we updated your med list in our EPIC system...    Continue your current medications the same...  We refilled your meds today...  Today we did a follow up CXR...    We will contact you w/ the results when available...   Glad you are feeling better w/ your chronic lyme disease diagnosis...  Work on weight reduction...  Call for any questions...  Let's plan a follow up visit in 41yr, sooner if needed for any breathing problems.Marland KitchenMarland Kitchen

## 2015-08-29 ENCOUNTER — Encounter: Payer: Self-pay | Admitting: Pulmonary Disease

## 2015-08-29 NOTE — Progress Notes (Signed)
Subjective:    Patient ID: Jaime Chapman, female    DOB: [birth year], 55 y.o.   MRN: [id number]  HPI 55 y/o WF here for a follow up visit...  Followed for general medical purposes w/ hx of Asthma (ex-smoker quit 2006), Dyslipidiemia, GERD, DJD/ DDD/ FM, and Anxiety... she is also followed by DrDeveshwar for Rheum, DrMcLean for Cards, & DrThompson/MMartin for CCS... ~  SEE PREV EPIC NOTES FOR OLDER DATA >>    01/2011>  sudden panic attack evaluated in [facility]> CTAngio neg for PE, incidental 42mm nodule postRUL & 36mm nodule antLLL, bullous dis in sup seg RLL, & atx in bases...  PFTs 8/12 showed FVC=2.14 (64%), FEV1=1.65 (60%), %1sec=77, mid-flows= 48% predicted...  LABS 11/13:  FLP- ok x LDL=123;  Chems- ok x SGPT=49;  CBC- wnl x wbc=11.8 after Pred;  TSH=1.06;  UA- clear...  CT Chest 11/13 ==> stable nodules measuring 12mm in postRUL & 46mm in RLL, no change, NAD...  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 We reviewed the following medical problems during today's office visit >>     AR/ Asthma> on Allegra, Advair100, Singulair10, Proair rescue, Mucinex prn; no recent URI/AB episodes & breathing at baseline now on maint meds.    Hx Abn CTA> this was 12/12 in [facility]; scan was neg for PE but showed incidental 21mm nodule postRUL & 47mm nodule antLLL, bullous dis in sup seg RLL, & atx in bases; f/u CTChest 11/13 was stable, no change...    Hx CP> likely musculoskeletal pain w/ neg cardiac eval DrMcLean 2011; no recurrence & she is doing exercises etc...    Dyslipid> on diet & FishOil;   FLP 11/13 showed TChol 198, TG 106, HDL 54, LDL 123    Hx Borderline DM> on diet alone; prev FBS 101-127 & random BS in [facility] 182; last yr BS=114 & A1c=5.9; now w/ 42# wt loss her BS=88...    Obesity> wt= 230# ==> 188# on Optifast program thru [facility]; now back to 213# 7 we reviewed diet, exercise, & wt reduction strategies...Marland KitchenMarland Kitchen.    GI> she saw DrStark 9/12> recurrent ventral hernia; EGD w/ gastritis (neg HPylori) & rec incr Omep20 to Bid; Colon w/ 1 hyperpl polyp removed- Rx Miralax & f/u 55yrs; Fatty liver dis=> should improve w/ wt reduction...    Ortho> DDD, FM, etc> followed by DrDeveshwar on Relafen, Tylenol, Amrix15; she is somewhat improved w/ her wt reduction...    Anxiety/ Panic> she has Alprazolam for prn use & we reviewed this med... We reviewed prob list, meds, xrays and labs> see below for updates >>   CXR 2/15 showed heart at upper lim of norm, lungs clear w/o lesions, NAD...  ~  May 01, 2014:  1 year ROV & [place] is going thru a lot of stress w/ husb recent Dx of lung cancer (age 69, smoker); she is seeing Jackelyn Hoehn for counseling (prev Richardo Priest) & she is given refill of Ativan 1mg - 1/2 to 1 tab Tid prn... She is a free lance  Corporate treasurer; she has established Primary Care w/ Gabri[facility] Eves but sees Dr. Harvel Quale ([place] [facility] w/ Dr. Lenor Coffin & Dr. Roena Malady) for Nature's Thyroid and bioequiv hormonal therapy (she states referred there by her Chiro since her Lyme test was neg but she had dog tapeworm & required treatment w/ Biltricide); she says she's sensitive to gluten (seen on food allergy panel) and now taking supplements from these doctors...     We follow her for hx Asthma and an abn CT Chest>> she is an ex-smoker, quit in 2006...    AR/ Asthma> on Allegra, Advair100Bid, Singulair10, Proair rescue, Mucinex prn; no recent URI/AB episodes & breathing at baseline now on maint meds w/o cough/ sput/ dyspnea/ CP/ etc...    Hx Abn CTA>  this was 12/12 in [facility]; scan was neg for PE but showed incidental 80mm nodule postRUL & 68mm nodule antLLL, bullous dis in sup seg RLL, & atx in bases; f/u CTChest 11/13 was stable, no change...    Anxiety/ Panic> she has Alprazolam for prn use & we renewed this med at her request in light of her husband's recent lung cancer dx.. We reviewed prob list, meds, xrays and labs> see below for updates >> her last Flu vaccine was 1/15 per Epic...  CXR 3/16 showed norm heart size, prom epicard fat pad, no nodules seen on CXR/ clear/ NAD...   ~  August 28, 2015:  36mo ROV & pulmonary follow up vist>  Jaime Chapman has mild persistent asthma/ RADS on Advair100-Bid, Singulair10, VentlinHFA rescue inhaler prn; she tells me that she ran out of her Advair several months ago, and uses the rescue inhaler 1-2 times per week;  We reviewed prev PFTs and rec daily use of the Advair inhaler=> meds refilled today... She notes sl dry cough, no sput 7 she is treated by her PCP w/ Lisinopril20, she understands that this can contrib to cough but she states that it's not bad enough to stop this med 7 switch to another non-ACE rx (she will let us know if she changes her mind)...     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 Salomon Fick Ward, "I was getting sicker & sicker", then diagnosed w/ hypothyroidism (now on Nature Thyroid) and Lyme dis; sent to [facility] ([facility]) in [place], Dr. Harvel Quale- "my Lyme markers were pos";  Treated w/ 10 IV Ozone treatments, pulse antibiotics + supplements w/ some improvement she says; she notes that during her eval they found +mycoplasma co-infection, +EBV, & they suspect that her prev Dx of FM was actually Lyme...    She was also diagnosed w/ Depression & started on Lexapro by her PCP- DrBedsole; she reports some improvement on this med;  She also sees [facility] for ADHD & is currently taking  Metadate;  Note- her husb Jaime Chapman, a former heavy smoker, was Dx w/ SqCell lung cancer in 2016- treated by DrMohamed, DrKinard, DrHendrickson w/ chemo, radiation, & LULobectomy; currently in remission.    EXAM shows Afeb, VSS, O2sat=98% on RA;  HEENT- neg, mallampati1;  Chest- clear w/o w/r/r;  Heart- RR w/o m/r/g;  Abd- soft nontender, neg;  Ext- neg w/o c/c/e;  Neuro- intact w/o focal neuro deficits...  CXR 08/28/15>  Norm heart size, similar epicardial fat pad, clear lungs, no nodules visualized...  LABS- last labs in [place] 07/2014 reviewed, ?she gets labs frequent medical checks by [facility] in  [place] but there are no notes avail in Care Everywhere to review... IMP/PLAN>>  Jaime Chapman has mild asthma/RADS and does well as long as she avoids URIs; rec to continue Advair100Bid, Singulair10, & VentolinHFA rescue as needed;  She has a Hx of 2 tiny <52mm nodules on CT in 2012-13 w/o change & CXR remains clear;  Note- she is on Lisinopril but declines my offer to change this med due to cough- she will let us know if it gets worse...               Problem List:   ALLERGIC RHINITIS (ICD-477.9) - on ALLEGRA 180/d- states she needs it daily!  ASTHMA (ICD-493.90) >> on ADVAIR 100Bid, SINGULAIR 10mg /d, PROVENTIL HFA rescue, & MUCINEX 1-2 Bid... ABNORMAL CT CHEST (ICD=793.19) >>   ~  she had an asthma flair in Feb09 due to exposure w/ grandson in daycare rx'd w/ Levaquin and Pred and improved...  ~  CXR 8/12 showed prom epicardial fat pad, normal heart size, clear lungs... ~  PFTs 8/12 showed FVC=2.14 (64%), FEV1=1.65 (60%), %1sec=77, mid-flows= 48% predicted... ~  12/12: prob sudden panic attack & evaluated in [facility]> CTAngio neg for PE, incidental 39mm nodule postRUL & 20mm nodule antLLL, bullous dis in sup seg RLL, & atx in bases;  We discussed protocol for f/u CXR & CT... ~  CT Chest 12/13 showed ==> stable nodules measuring 25mmin postRUL & 80mm in RLL, no change, NAD; we will follow w/ CXR from here... ~  CXR 2/15  showed heart at upper lim of norm, lungs clear w/o lesions, NAD ~  3/16: on Allegra, Advair100Bid, Singulair10, Proair rescue, Mucinex prn; no recent URI/AB episodes & breathing at baseline now on maint meds w/o cough/ sput/ dyspnea/ CP/ etc... ~  CXR 3/16 showed norm heart size, prom epicard fat pad, no nodules seen on CXR/ clear/ NAD ~  CXR 07/2015>  Norm heart size, similar epicardial fat pad, clear lungs, no nodules visualized...   FOLLOWED for PRIMARY CARE by Gabriela Eves and she sees [facility] in [place] for Alternative Medical Therapies >>   CHEST PAIN (ICD-786.50) - Cardiac eval 3/11 by DrMcLean for atyp CP> symptoms improved w/ PPI Rx. ~  EKG showed NSR, WNL... ~  2DECho showed normal LVF w/ EF= 60-65%, norm wall motion, gr 2 DD, valves normal. ~  Stress Echo was neg- no ischemia...  DYSLIPIDEMIA (ICD-272.4) - on diet + FISH OIL but unfortunately her weight is up to 230# ~  [place] 10/07 showed TChol 181, TG 117, HDL 35, LDL 123... rec for better diet & exercise program. ~  FLP 3/09 (wt=207#) showed TChol 201, TG 204, HDL 41, LDL 128... she prefers diet Rx. ~  FLP 10/10 (wt=221#) showed TChol 217, TG 225, HDL 39, LDL 150... offered med Rx. ~  FLP 8/12 (wt=230#) showed TChol 182, TG 146, HDL 39, LDL 114 ~  FLP 11/13 (wt=188#) showed TChol 198, TG 106, HDL 54, LDL 123   Borderline DM> on diet alone; prev FBS 101-127 & random BS in [facility] 182; BS=114, A1c=5.9; REC> low carb diet, get wt down. ~  Labs 11/13 (wt=188#) showed BS=88  OBESITY (ICD-278.00) - she weighs ~230# and is 64" tall for a BMI= 40... we discussed diet & exercise program. ~  7/11:  weight remains 222# & she blames the Cymbalta for her lack of wt reduction. ~  8/12:  Weight = 230# & she is off the Cymbalta... ~  1/13:  Weight = 230# &  we reviewed diet & exercise prescription. ~  11/13:  Weight = 188# on Optifast diet via [facility] in [place].  GERD (ICD-530.81) - on OMEPRAZOLE 20mg Bid w/ improvement;  we  discussed the assoc of reflux to asthma... ~  EGD 9/12 by DrStark for reflux symptoms showed mild gastitis; rec to incr PPI to Bid... ~  UGISeries 6/13 by DrMMartin showed tiny HH but signif reflux seen, barium pill passed normally into the stomach w/o delay...  COLON POLYP >> she has mild constip, gas, bloating & rec to take Miralax & anti-gas meds as needed... ~  Colonoscopy 9/12 by DrStark showed normal colon x 50mmsessile polyp in sigmoid area; path= hyperpl & f/u planned 40yrs...  FATTY LIVER DISEASE >> we have discussed the need for diet, exercise, & wt reduction strategies... ~  Labs here 2009 showed SGOT=22, SGPT=26 ~  Labs here 2010 showed SGOT=23, SGPT=28 ~  Labs [facility] 12/12 showed SGOT=54, SGPT=66... She is asked to get on diet & get wt down! ~  Labs here 11/13 showed SGOT=25, SGPT=49 with her weight down 42# to 188# today...  RECURRENT VENTRAL HERNIA >>  ~  Ventral hernia repair 2008 by DrThompson ==> hernia recurred... ~ s/p Lap ventral hernia repair w/ mesh 7/11 by DrThompson; she is c/o recurrent pain & f/u CT showed recurrent hernia; she had second opinion consult DrMMartin & he stressed wt reduction & offered lap band surg... DrStark agreed w/ thoughts for another opinion from [facility] in [place] when she is ready. ~  11/13:  She has lost 42# on Optifast diet thru [facility]; she will check back w/ DrMartin regarding ventral hernia surg...  DEGENERATIVE DISC DISEASE (ICD-722.6) - she is followed by DrDeveshwar for Rheum> notes reviewed: FM, fall w/ LBP, MRI w/ facet arthritis, CSpine disc dis w/ chronic pain;  On RELAFEN 750mg  Bid as needed... Also tried Lidoderm, PT, TENS, heat etc... ~  5/12:  She reports a fall in [place] w/ bruising in her side> saw Chiropractor, then DrDuda, then [facility]....  FIBROMYALGIA (ICD-729.1) - DrNeal has given her Ambien to help w/ her sleep pattern; She tried Cymbalta but blamed wt gain on this med; she has been referred to Integr[facility] in the past... Currently taking RELAFEN 750mg  bid prn... ~  10/13:  Note from DrDeveshwar reviewed> FM, DDD in [place]; symptoms improved w/ wt loss; still fatigued & stressed; hx of "sleep eating" after taking Ambien; they tried AMRIX 15mg  Qhs...  VITAMIN D DEFICIENCY (ICD-268.9) - Vit D level here 3/09 was 13 and started on 50K weekly... pt indicates that DrDeveshwar has been following her Vit D level since then and switched her to 5000 u two times a day... ~  8/12:  There is no Vit D supplement listed on her med sheet... ~  11/13:  She is taking Vit D 1000u daily...  STRESS DISORDER & PANIC ATTACKS - she blames the closed MRI w/ the recurrence of her Panic attacks> Rx w/ ALPRAZOLAM 0.5mg  Tid as directed... ~  12/12: had another ?panic attack at work, taken to [facility] w/ neg eval... ~  3/16: her husb was recently Dx w/ lung Cancer & she requests refill Ativan 1mg  - 1/2 to 1 tab Tid prn...   HEALTH MAINT --- GYN= DrNeal & he does mammograms in his office & BMD as well... ~  3/16: she tells me that hewr chiro referred her to [facility] in [place] for alternative medical therapies...    Past Surgical History  Procedure Laterality Date  . Ventral hernia repair  09/2006    Dr. Grandville Silos  . Laparoscopy  2002  . Endometrial ablation  2009  . Laparoscopic ventral hernia repair w/ mesh  08/2009    Dr. Grandville Silos, [place]  . Cesarean section  1982, 1985    x 2   . Tubal ligation    . Ventral hernia repair N/A 04/14/2012    Procedure: Laparoscopic Repair of  Ventral Hernia;  Surgeon: Pedro Earls, MD;  Location: [facility];  Service: General;  Laterality: N/A;  Repair of Recurrent Ventral Hernia  . Hernia repair  04/14/12    ventral hernia repair    Outpatient Encounter Prescriptions as of 08/28/2015  Medication Sig  . albuterol (VENTOLIN HFA) 108 (90 Base) MCG/ACT inhaler INHALE 2 PUFFS INTO THE LUNGS EVERY 4HRS AS NEEDED FOR WHEEZING  . escitalopram (LEXAPRO) 10 MG  tablet Take 1 tablet (10 mg total) by mouth daily.  [name] estradiol (VIVELLE-DOT) 0.1 MG/24HR patch Place 1 patch onto the skin 2 (two) times a week.  [name] lisinopril (PRINIVIL,ZESTRIL) 20 MG tablet Take 1 tablet (20 mg total) by mouth daily.  [name] MAGNESIUM PO Take 1 tablet by mouth daily.   . montelukast (SINGULAIR) 10 MG tablet Take 1 tablet (10 mg total) by mouth at bedtime.  [name] NALTREXONE HCL PO Take 3 mg by mouth daily.  . progesterone (PROMETRIUM) 100 MG capsule Take 1 tablet by mouth daily x 3 weeks then on stop on the fourth week.  repeat  . Thyroid 48.75 MG TABS Take 1 tablet by mouth daily. Nature-thyroid  . [DISCONTINUED] VENTOLIN HFA 108 (90 Base) MCG/ACT inhaler INHALE 2 PUFFS INTO THE LUNGS EVERY 4HRS AS NEEDED FOR WHEEZING  . Fluticasone-Salmeterol (ADVAIR DISKUS) 100-50 MCG/DOSE AEPB Inhale 1 puff into the lungs 2 (two) times daily.  . [DISCONTINUED] Fluticasone-Salmeterol (ADVAIR DISKUS) 100-50 MCG/DOSE AEPB Inhale 1 puff into the lungs 2 (two) times daily. (Patient not taking: Reported on 08/28/2015)  . [DISCONTINUED] predniSONE (DELTASONE) 10 MG tablet Take 3 tablets for 3 days, then 2 tablets for 3 days, then 1 tablet for 3 days. (Patient not taking: Reported on 08/28/2015)   No facility-administered encounter medications on file as of 08/28/2015.    Allergies  Allergen Reactions  . Progesterone     Stroke-like symptoms  . Codeine     REACTION: nausea,vomiting,dizziness  . Cymbalta [Duloxetine Hcl]     Pt states it caused weight gain    Current Medications, Allergies, Past Medical History, Past Surgical History, Family History, and Social History were reviewed in [employer] record.    Review of Systems         See HPI - all other systems neg except as noted...  The patient denies anorexia, fever, weight loss, weight gain, vision loss, decreased hearing, hoarseness, chest pain, syncope, dyspnea on exertion, peripheral edema, prolonged cough, headaches,  hemoptysis, abdominal pain, melena, hematochezia, severe indigestion/heartburn, hematuria, incontinence, muscle weakness, suspicious skin lesions, transient blindness, difficulty walking, depression, unusual weight change, abnormal bleeding, enlarged lymph nodes, and angioedema.     Objective:   Physical Exam    WD, Overweight, 55 y/o WF in NAD... GENERAL:  Alert & oriented; pleasant & cooperative... HEENT:  [place]/AT, EOM-wnl, PERRLA, EACs-clear, TMs-wnl, NOSE-clear, THROAT-clear & wnl. NECK:  Supple w/ full ROM; no JVD; normal carotid impulses w/o bruits; no thyromegaly or nodules palpated; no lymphadenopathy. CHEST:  Clear to P & A; without wheezes/ rales/ or rhonchi. HEART:  Regular  Rhythm; without murmurs/ rubs/ or gallops. ABDOMEN:  Obese, soft & mild tender; normal bowel sounds; no organomegaly or masses detected. EXT: without deformities or arthritic changes; no varicose veins/ +venous insuffic/ tr edema. NEURO:  CN's intact;  no focal neuro deficits;  + trigger points on exam... DERM:  No lesions noted; no rash etc...  RADIOLOGY DATA:  Reviewed in the EPIC EMR & discussed w/ the patient...  LABORATORY DATA:  Reviewed in the EPIC EMR & discussed w/ the patient...   Assessment & Plan:    ASTHMA/ Panic>  Stable on her combination of Advair, Proventil, Singulair; breathing improved overall on these meds and Ativan prn anxiety... 2 tiny right pulm nodules ==> no change on CT 12/13 and lesions not seen on CXR, therefore most likely benign & she is reassured, we will continue to monitor CXR yearly...   MEDICAL PROBLEMS MONITORED BY DrBedsole & [facility] >>  DYSLIPIDEMIA>  Her FLP is improved on her diet w/ wt loss etc...  Elevated Fasting blood sugar>  Similiarly her BS has improved to 88 on her diet program...  ?THYROID> on Nature Thyroid per [employer]...  ?Chronic Lyme Disease> treated by Moise Boring- [facility]  Obesity>  As above, diet +  exercise are the keys!!! She prev lost 42# on her Optifast program...  GERD, Gastritis>  Off Prilosec now...  ABDOMINAL symptoms>  S/p surg for recurrent abd wall hernias & she should consider a consultation from [facility] in [place]... GERD treated w/ PPI prn now, and constip/ bloating improved on Miralax, Simethacone.  DJD, CSpine DDD, Chr Pain, Spinal arthritis>  Prev followed by DrDeveshwar on RELAFEN & AMRIX; now on Advil & Voltaren gel prn..  Vit D deficiency>  Pt states Vit D levels checked by DrDeveshwar who has her on OTC Vit d supplement daily...  ANXIETY/ Panic disorder>  She asked that we refill her Ativan 1mg - 1/2 to 1 tab Tid prn...   Patient's Medications  New Prescriptions   No medications on file  Previous Medications   ESCITALOPRAM (LEXAPRO) 10 MG TABLET    Take 1 tablet (10 mg total) by mouth daily.   ESTRADIOL (VIVELLE-DOT) 0.1 MG/24HR PATCH    Place 1 patch onto the skin 2 (two) times a week.   LISINOPRIL (PRINIVIL,ZESTRIL) 20 MG TABLET    Take 1 tablet (20 mg total) by mouth daily.   MAGNESIUM PO    Take 1 tablet by mouth daily.    MONTELUKAST (SINGULAIR) 10 MG TABLET    Take 1 tablet (10 mg total) by mouth at bedtime.   NALTREXONE HCL PO    Take 3 mg by mouth daily.   PROGESTERONE (PROMETRIUM) 100 MG CAPSULE    Take 1 tablet by mouth daily x 3 weeks then on stop on the fourth week.  repeat   THYROID 48.75 MG TABS    Take 1 tablet by mouth daily. Nature-thyroid  Modified Medications   Modified Medication Previous Medication   ALBUTEROL (VENTOLIN HFA) 108 (90 BASE) MCG/ACT INHALER VENTOLIN HFA 108 (90 Base) MCG/ACT inhaler      INHALE 2 PUFFS INTO THE LUNGS EVERY 4HRS AS NEEDED FOR WHEEZING    INHALE 2 PUFFS INTO THE LUNGS EVERY 4HRS AS NEEDED FOR WHEEZING   FLUTICASONE-SALMETEROL (ADVAIR DISKUS) 100-50 MCG/DOSE AEPB Fluticasone-Salmeterol (ADVAIR DISKUS) 100-50 MCG/DOSE AEPB      Inhale 1 puff into the lungs 2 (two) times daily.    Inhale 1 puff into the lungs 2  (two) times daily.  Discontinued Medications   PREDNISONE (DELTASONE) 10 MG TABLET    Take 3 tablets for 3 days, then 2 tablets for 3 days, then 1 tablet for 3 days.

## 2015-09-01 ENCOUNTER — Other Ambulatory Visit: Payer: Self-pay | Admitting: Family Medicine

## 2015-09-07 ENCOUNTER — Other Ambulatory Visit: Payer: Self-pay | Admitting: Family Medicine

## 2015-11-24 DIAGNOSIS — A6920 Lyme disease, unspecified: Secondary | ICD-10-CM | POA: Diagnosis not present

## 2015-11-24 DIAGNOSIS — E039 Hypothyroidism, unspecified: Secondary | ICD-10-CM | POA: Diagnosis not present

## 2015-11-24 DIAGNOSIS — I10 Essential (primary) hypertension: Secondary | ICD-10-CM | POA: Diagnosis not present

## 2015-11-24 DIAGNOSIS — F419 Anxiety disorder, unspecified: Secondary | ICD-10-CM | POA: Diagnosis not present

## 2015-11-24 DIAGNOSIS — G4700 Insomnia, unspecified: Secondary | ICD-10-CM | POA: Diagnosis not present

## 2015-11-24 DIAGNOSIS — E559 Vitamin D deficiency, unspecified: Secondary | ICD-10-CM | POA: Diagnosis not present

## 2015-11-24 DIAGNOSIS — N951 Menopausal and female climacteric states: Secondary | ICD-10-CM | POA: Diagnosis not present

## 2015-11-29 DIAGNOSIS — L03211 Cellulitis of face: Secondary | ICD-10-CM | POA: Diagnosis not present

## 2015-11-30 ENCOUNTER — Emergency Department (HOSPITAL_COMMUNITY)
Admission: EM | Admit: 2015-11-30 | Discharge: 2015-11-30 | Disposition: A | Discharge status: Home / Self Care | Payer: BLUE CROSS/BLUE SHIELD | Attending: Emergency Medicine | Admitting: Emergency Medicine

## 2015-11-30 DIAGNOSIS — J449 Chronic obstructive pulmonary disease, unspecified: Secondary | ICD-10-CM | POA: Diagnosis not present

## 2015-11-30 DIAGNOSIS — L03211 Cellulitis of face: Secondary | ICD-10-CM | POA: Insufficient documentation

## 2015-11-30 DIAGNOSIS — E119 Type 2 diabetes mellitus without complications: Secondary | ICD-10-CM | POA: Insufficient documentation

## 2015-11-30 DIAGNOSIS — E039 Hypothyroidism, unspecified: Secondary | ICD-10-CM | POA: Insufficient documentation

## 2015-11-30 DIAGNOSIS — Z87891 Personal history of nicotine dependence: Secondary | ICD-10-CM | POA: Insufficient documentation

## 2015-11-30 DIAGNOSIS — R220 Localized swelling, mass and lump, head: Secondary | ICD-10-CM | POA: Diagnosis not present

## 2015-11-30 LAB — CBC WITH DIFFERENTIAL/PLATELET
BASOS ABS: 0 10*3/uL (ref 0.0–0.1)
BASOS PCT: 0 %
Eosinophils Absolute: 0.2 10*3/uL (ref 0.0–0.7)
Eosinophils Relative: 3 %
HEMATOCRIT: 38.3 % (ref 36.0–46.0)
HEMOGLOBIN: 12.6 g/dL (ref 12.0–15.0)
Lymphocytes Relative: 26 %
Lymphs Abs: 2.1 10*3/uL (ref 0.7–4.0)
MCH: 28.6 pg (ref 26.0–34.0)
MCHC: 32.9 g/dL (ref 30.0–36.0)
MCV: 86.8 fL (ref 78.0–100.0)
Monocytes Absolute: 0.5 10*3/uL (ref 0.1–1.0)
Monocytes Relative: 6 %
NEUTROS ABS: 5.1 10*3/uL (ref 1.7–7.7)
NEUTROS PCT: 65 %
Platelets: 233 10*3/uL (ref 150–400)
RBC: 4.41 MIL/uL (ref 3.87–5.11)
RDW: 13.2 % (ref 11.5–15.5)
WBC: 7.9 10*3/uL (ref 4.0–10.5)

## 2015-11-30 LAB — BASIC METABOLIC PANEL
ANION GAP: 8 (ref 5–15)
BUN: 14 mg/dL (ref 6–20)
CALCIUM: 8.9 mg/dL (ref 8.9–10.3)
CHLORIDE: 108 mmol/L (ref 101–111)
CO2: 23 mmol/L (ref 22–32)
Creatinine, Ser: 0.78 mg/dL (ref 0.44–1.00)
GFR calc non Af Amer: >60 mL/min (ref >60)
Glucose, Bld: 140 mg/dL — ABNORMAL HIGH (ref 65–99)
Potassium: 4.2 mmol/L (ref 3.5–5.1)
Sodium: 139 mmol/L (ref 135–145)

## 2015-11-30 MED ORDER — AMOXICILLIN-POT CLAVULANATE 875-125 MG PO TABS: 1.0000 | ORAL_TABLET | Freq: Two times a day (BID) | ORAL | 0 refills | Status: DC

## 2015-11-30 MED ORDER — SODIUM CHLORIDE 0.9 % IV SOLN
INTRAVENOUS | Status: DC
  Administered 2015-11-30: 08:00:00 via INTRAVENOUS

## 2015-11-30 MED ORDER — DEXTROSE 5 % IV SOLN
1.0000 g | Freq: Once | INTRAVENOUS | Status: AC
  Administered 2015-11-30: 1 g via INTRAVENOUS
  Filled 2015-11-30: qty 10

## 2015-11-30 NOTE — Discharge Instructions (Signed)
Continue your current antibiotics. Add the Augmentin. Apply warm compresses to her face. Monitor for fevers worsening symptoms. Follow up with her doctor next week to be rechecked

## 2015-11-30 NOTE — ED Notes (Signed)
Pt brought in to room appears awake and alert in no distress

## 2015-11-30 NOTE — ED Provider Notes (Signed)
[facility] DEPT Provider Note   CSN: [id number] Arrival date & time: 11/30/15  [id number]     History   Chief Complaint Chief Complaint  Patient presents with  . Facial Swelling    HPI Jaime Chapman is a 55 y.o. female.  The history is provided by the patient.  The patient presents to the emergency room with complaints of left-sided facial swelling.  Patient states she noticed the symptoms a couple of days ago. Initially it started out as mild redness on the left side of her face. So felt that that area was slightly swollen and the skin was tight. It is tender to palpation. She's had some mild sinus congestion. No known injuries. No toothaches. She went to an urgent care yesterday. She was started on Bactrim. Patient did mention that she had been visiting a friend in the hospital recently so the urgent care provider was concerned about MRSA. Patient was instructed to follow-up with an ENT doctor or dermatologist this coming week. She was also told if she had any worsening symptoms to immediately go to an emergency room. She felt like her face was more swollen this morning so she came in for evaluation. She denies any difficulty breathing. She denies any fevers or chills.  Past Medical History:  Diagnosis Date  . Allergic rhinitis   . Anxiety   . Asthma   . DDD (degenerative disc disease)    also has spinal arthritis  . Depression   . Diabetes mellitus    borderline  . Dyslipidemia   . Fibromyalgia   . GERD (gastroesophageal reflux disease)   . Lung nodules    RIGHT LUNG--LAST EVALUATED BY CHEST CT 02/01/12 - STABLE AND FOLLOW UP PLANNED IN ONE YEAR.  [name] Obesity   . Sinusitis   . Stress disorder, acute   . Vitamin D deficiency     Patient Active Problem List   Diagnosis Date Noted  . Major depression, recurrent ([place]) 05/08/2015  . Asthma, chronic obstructive, with acute exacerbation ([place]) 01/28/2015  . Essential hypertension 07/26/2014  . Dizziness 07/26/2014  .  Constipation - functional 11/20/2013  . Hypothyroidism 11/16/2013  . H/O ventral hernia repair 05/01/2012  . Pulmonary nodules 01/26/2012  . Overweight(278.02) 01/26/2012  . Impaired fasting glucose 03/04/2011  . Fatty liver disease, nonalcoholic [id number]  . Panic anxiety syndrome 10/21/2010  . VENTRAL HERNIA-twice recurrent 09/08/2009  . VITAMIN D DEFICIENCY 12/27/2008  . Hyperlipidemia 05/22/2007  . Moderate persistent asthma 05/22/2007  . DEGENERATIVE DISC DISEASE 05/22/2007  . FIBROMYALGIA 05/22/2007  . Allergic rhinitis 04/14/2007  . GERD 04/14/2007    Past Surgical History:  Procedure Laterality Date  . [place]   x 2   . ENDOMETRIAL ABLATION  2009  . HERNIA REPAIR  04/14/12   ventral hernia repair  . Laparoscopic ventral hernia repair w/ mesh  08/2009   Dr. Grandville Silos, [place]  . LAPAROSCOPY  2002  . TUBAL LIGATION    . VENTRAL HERNIA REPAIR  09/2006   Dr. Grandville Silos  . VENTRAL HERNIA REPAIR N/A 04/14/2012   Procedure: Laparoscopic Repair of  Ventral Hernia;  Surgeon: Pedro Earls, MD;  Location: [facility];  Service: General;  Laterality: N/A;  Repair of Recurrent Ventral Hernia    OB History    No data available       Home Medications    Prior to Admission medications   Medication Sig Start Date End Date Taking? Authorizing Provider  albuterol (VENTOLIN HFA) 108 (  90 Base) MCG/ACT inhaler INHALE 2 PUFFS INTO THE LUNGS EVERY 4HRS AS NEEDED FOR WHEEZING 08/28/15   Noralee Space, MD  amoxicillin-clavulanate (AUGMENTIN) 875-125 MG tablet Take 1 tablet by mouth 2 (two) times daily. 11/30/15   Dorie Rank, MD  escitalopram (LEXAPRO) 10 MG tablet TAKE 1 TABLET BY MOUTH DAILY 09/07/15   [name] Cletis [place], MD  estradiol (VIVELLE-DOT) 0.1 MG/24HR patch Place 1 patch onto the skin 2 (two) times a week. 07/07/15   Historical Provider, MD  Fluticasone-Salmeterol (ADVAIR DISKUS) 100-50 MCG/DOSE AEPB Inhale 1 puff into the lungs 2 (two) times daily. 08/28/15   Noralee Space, MD   lisinopril (PRINIVIL,ZESTRIL) 20 MG tablet TAKE 1 TABLET EVERY DAY 09/01/15   [name] Cletis [place], MD  MAGNESIUM PO Take 1 tablet by mouth daily.     Historical Provider, MD  montelukast (SINGULAIR) 10 MG tablet Take 1 tablet (10 mg total) by mouth at bedtime. 05/01/14   Noralee Space, MD  NALTREXONE HCL PO Take 3 mg by mouth daily.    Historical Provider, MD  progesterone (PROMETRIUM) 100 MG capsule Take 1 tablet by mouth daily x 3 weeks then on stop on the fourth week.  repeat    Historical Provider, MD  Thyroid 48.75 MG TABS Take 1 tablet by mouth daily. Nature-thyroid    Historical Provider, MD    Family History Family History  Problem Relation Age of Onset  . Lung cancer Maternal Grandmother   . Cancer Maternal Grandmother     lung  . Colon cancer Neg Hx   . Diabetes Mother   . Heart disease Mother 45  . Arthritis Mother     Social History Social History  Substance Use Topics  . Smoking status: Former Smoker    Quit date: 03/01/2004  . Smokeless tobacco: Never Used  . Alcohol use No     Allergies   Progesterone; Codeine; and Cymbalta [duloxetine hcl]   Review of Systems Review of Systems  All other systems reviewed and are negative.    Physical Exam Updated Vital Signs BP 165/87 (BP Location: Right Arm)   Pulse 93   Temp 98.4 F (36.9 C) (Oral)   Resp 17   Ht 5\' 5"  (1.651 m)   Wt 98 kg   SpO2 98%   BMI 35.94 kg/m   Physical Exam  Constitutional: She appears well-developed and well-nourished. No distress.  HENT:  Head: Normocephalic and atraumatic.  Right Ear: External ear normal.  Left Ear: External ear normal.  Nose: No mucosal edema, rhinorrhea or nasal deformity. Left sinus exhibits maxillary sinus tenderness.  Mild to moderate erythema in the left side of her face to below the eye and adjacent to the nares, mild induration, no fluctuance, no increased warmth; normal dentition  Eyes: Conjunctivae are normal. Right eye exhibits no discharge. Left eye exhibits  no discharge. No scleral icterus.  Neck: Neck supple. No tracheal deviation present.  Cardiovascular: Normal rate, regular rhythm and intact distal pulses.   Pulmonary/Chest: Effort normal and breath sounds normal. No stridor. No respiratory distress. She has no wheezes. She has no rales.  Abdominal: Soft. Bowel sounds are normal. She exhibits no distension. There is no tenderness. There is no rebound and no guarding.  Musculoskeletal: She exhibits no edema or tenderness.  Neurological: She is alert. She has normal strength. No cranial nerve deficit (no facial droop, extraocular movements intact, no slurred speech) or sensory deficit. She exhibits normal muscle tone. She displays no seizure activity.  Coordination normal.  Skin: Skin is warm and dry. No rash noted.  Psychiatric: She has a normal mood and affect.  Nursing note and vitals reviewed.    ED Treatments / Results  Labs (all labs ordered are listed, but only abnormal results are displayed) Labs Reviewed  BASIC METABOLIC PANEL - Abnormal; Notable for the following:       Result Value   Glucose, Bld 140 (*)    All other components within normal limits  CBC WITH DIFFERENTIAL/PLATELET    Procedures Procedures (including critical care time)  Medications Ordered in ED Medications  0.9 %  sodium chloride infusion ( Intravenous New Bag/Given 11/30/15 0826)  cefTRIAXone (ROCEPHIN) 1 g in dextrose 5 % 50 mL IVPB (1 g Intravenous New Bag/Given 11/30/15 0826)     Initial Impression / Assessment and Plan / ED Course  I have reviewed the triage vital signs and the nursing notes.  Pertinent labs & imaging results that were available during my care of the patient were reviewed by me and considered in my medical decision making (see chart for details).   The patient's exam is consistent with a facial cellulitis. Exam does not suggest abscess.  Patient is nontoxic, afebrile with normal laboratory tests. She just started the Bactrim  yesterday. I think it's reasonable to continue to try outpatient treatment. I will add on Augmentin for better strep coverage. She can continue the Bactrim for her MRSA coverage. Follow up with her doctor to be rechecked this week. Return for fever or worsening symptoms  nal Clinical Impressions(s) / ED Diagnoses   Final diagnoses:  Facial cellulitis    New Prescriptions New Prescriptions   AMOXICILLIN-CLAVULANATE (AUGMENTIN) 875-125 MG TABLET    Take 1 tablet by mouth 2 (two) times daily.     Dorie Rank, MD 11/30/15 [phone]

## 2015-12-03 ENCOUNTER — Ambulatory Visit (INDEPENDENT_AMBULATORY_CARE_PROVIDER_SITE_OTHER): Payer: BLUE CROSS/BLUE SHIELD | Admitting: Family Medicine

## 2015-12-03 ENCOUNTER — Encounter: Payer: Self-pay | Admitting: Family Medicine

## 2015-12-03 VITALS — BP 128/70 | HR 74 | Wt 215.0 lb

## 2015-12-03 DIAGNOSIS — L03211 Cellulitis of face: Secondary | ICD-10-CM

## 2015-12-03 NOTE — Patient Instructions (Signed)
Finish your antibiotic  Let us know if you have any recurrence  Cellulitis Cellulitis is an infection of the skin and the tissue beneath it. The infected area is usually red and tender. Cellulitis occurs most often in the arms and lower legs.  CAUSES  Cellulitis is caused by bacteria that enter the skin through cracks or cuts in the skin. The most common types of bacteria that cause cellulitis are staphylococci and streptococci. SIGNS AND SYMPTOMS   Redness and warmth.  Swelling.  Tenderness or pain.  Fever. DIAGNOSIS  Your health care provider can usually determine what is wrong based on a physical exam. Blood tests may also be done. TREATMENT  Treatment usually involves taking an antibiotic medicine. HOME CARE INSTRUCTIONS   Take your antibiotic medicine as directed by your health care provider. Finish the antibiotic even if you start to feel better.  Keep the infected arm or leg elevated to reduce swelling.  Apply a warm cloth to the affected area up to 4 times per day to relieve pain.  Take medicines only as directed by your health care provider.  Keep all follow-up visits as directed by your health care provider. SEEK MEDICAL CARE IF:   You notice red streaks coming from the infected area.  Your red area gets larger or turns dark in color.  Your bone or joint underneath the infected area becomes painful after the skin has healed.  Your infection returns in the same area or another area.  You notice a swollen bump in the infected area.  You develop new symptoms.  You have a fever. SEEK IMMEDIATE MEDICAL CARE IF:   You feel very sleepy.  You develop vomiting or diarrhea.  You have a general ill feeling (malaise) with muscle aches and pains.   This information is not intended to replace advice given to you by your health care provider. Make sure you discuss any questions you have with your health care provider.   Document Released: 11/25/2004 Document  Revised: 11/06/2014 Document Reviewed: 05/03/2011 Elsevier Interactive Patient Education [employer].

## 2015-12-03 NOTE — Progress Notes (Signed)
   Subjective:    Patient ID: Jaime Chapman, female    DOB: 11/02/1960, 55 y.o.   MRN: [id number]  HPI This is a 55 yo female who presents today for follow up of facial cellulitis. She was seen in an urgent care 11/29/15 and was started on bactrim for left sided pain, erythema. She felt that her face was more swollen the following day so she went to the ED where she was given IV rocephin and augmentin and prednisone were added. Today she presents for follow up. Swelling has resolved. No fevers.   Past Medical History:  Diagnosis Date  . Allergic rhinitis   . Anxiety   . Asthma   . DDD (degenerative disc disease)    also has spinal arthritis  . Depression   . Diabetes mellitus    borderline  . Dyslipidemia   . Fibromyalgia   . GERD (gastroesophageal reflux disease)   . Lung nodules    RIGHT LUNG--LAST EVALUATED BY CHEST CT 02/01/12 - STABLE AND FOLLOW UP PLANNED IN ONE YEAR.  [name] Obesity   . Sinusitis   . Stress disorder, acute   . Vitamin D deficiency    Past Surgical History:  Procedure Laterality Date  . [place]   x 2   . ENDOMETRIAL ABLATION  2009  . HERNIA REPAIR  04/14/12   ventral hernia repair  . Laparoscopic ventral hernia repair w/ mesh  08/2009   Dr. Grandville Silos, [place]  . LAPAROSCOPY  2002  . TUBAL LIGATION    . VENTRAL HERNIA REPAIR  09/2006   Dr. Grandville Silos  . VENTRAL HERNIA REPAIR N/A 04/14/2012   Procedure: Laparoscopic Repair of  Ventral Hernia;  Surgeon: Pedro Earls, MD;  Location: [facility];  Service: General;  Laterality: N/A;  Repair of Recurrent Ventral Hernia   Family History  Problem Relation Age of Onset  . Lung cancer Maternal Grandmother   . Cancer Maternal Grandmother     lung  . Colon cancer Neg Hx   . Diabetes Mother   . Heart disease Mother 79  . Arthritis Mother    Social History  Substance Use Topics  . Smoking status: Former Smoker    Quit date: 03/01/2004  . Smokeless tobacco: Never Used  . Alcohol use No       Review of Systems Per HPI    Objective:   Physical Exam Physical Exam  Vitals reviewed. Constitutional: Oriented to person, place, and time. Appears well-developed and well-nourished. Obese.  HENT:  Head: Normocephalic and atraumatic.  Eyes: Conjunctivae are normal.  Neck: Normal range of motion. Neck supple.  Cardiovascular: Normal rate.   Pulmonary/Chest: Effort normal.  Musculoskeletal: Normal range of motion.  Neurological: Alert and oriented to person, place, and time.  Skin: Skin is warm and dry.  Psychiatric: Normal mood and affect. Behavior is normal. Judgment and thought content normal.      BP 128/70   Pulse 74   Wt 215 lb (97.5 kg)   SpO2 95%   BMI 35.78 kg/m  Wt Readings from Last 3 Encounters:  12/03/15 215 lb (97.5 kg)  11/30/15 216 lb (98 kg)  08/28/15 210 lb 3.2 oz (95.3 kg)       Assessment & Plan:  1. Cellulitis of face - resolving - instructed to finish antibiotics, prednisone - RTC precautions reviewed   Clarene Reamer, FNP-BC  [facility], [place]  12/03/2015 8:27 AM

## 2015-12-17 ENCOUNTER — Telehealth: Payer: Self-pay | Admitting: Family Medicine

## 2015-12-17 DIAGNOSIS — Z23 Encounter for immunization: Secondary | ICD-10-CM | POA: Diagnosis not present

## 2015-12-17 NOTE — Telephone Encounter (Signed)
Please call and schedule CPE with fasting labs for Dr. Diona Browner.

## 2015-12-17 NOTE — Telephone Encounter (Signed)
Labs 11/27 cpx 11/30 Pt aware

## 2016-01-02 DIAGNOSIS — A6920 Lyme disease, unspecified: Secondary | ICD-10-CM | POA: Diagnosis not present

## 2016-01-02 DIAGNOSIS — G4700 Insomnia, unspecified: Secondary | ICD-10-CM | POA: Diagnosis not present

## 2016-01-02 DIAGNOSIS — R0602 Shortness of breath: Secondary | ICD-10-CM | POA: Diagnosis not present

## 2016-01-02 DIAGNOSIS — E039 Hypothyroidism, unspecified: Secondary | ICD-10-CM | POA: Diagnosis not present

## 2016-01-02 DIAGNOSIS — N951 Menopausal and female climacteric states: Secondary | ICD-10-CM | POA: Diagnosis not present

## 2016-01-02 DIAGNOSIS — R5383 Other fatigue: Secondary | ICD-10-CM | POA: Diagnosis not present

## 2016-01-12 ENCOUNTER — Other Ambulatory Visit: Payer: Self-pay | Admitting: Family Medicine

## 2016-01-15 ENCOUNTER — Telehealth: Payer: Self-pay | Admitting: Family Medicine

## 2016-01-15 DIAGNOSIS — E782 Mixed hyperlipidemia: Secondary | ICD-10-CM

## 2016-01-15 DIAGNOSIS — E039 Hypothyroidism, unspecified: Secondary | ICD-10-CM

## 2016-01-15 DIAGNOSIS — E559 Vitamin D deficiency, unspecified: Secondary | ICD-10-CM

## 2016-01-15 DIAGNOSIS — R7301 Impaired fasting glucose: Secondary | ICD-10-CM

## 2016-01-15 DIAGNOSIS — Z1159 Encounter for screening for other viral diseases: Secondary | ICD-10-CM

## 2016-01-15 NOTE — Telephone Encounter (Signed)
-----   Message from Marchia Bond sent at 01/15/2016  2:46 PM EST ----- Regarding: Cpx labs Mon 11/27, need orders. Thanks! :-) Please order  future cpx labs for pt's upcoming lab appt. Thanks Aniceto Boss

## 2016-01-26 ENCOUNTER — Other Ambulatory Visit (INDEPENDENT_AMBULATORY_CARE_PROVIDER_SITE_OTHER): Payer: BLUE CROSS/BLUE SHIELD

## 2016-01-26 DIAGNOSIS — E782 Mixed hyperlipidemia: Secondary | ICD-10-CM

## 2016-01-26 DIAGNOSIS — R7989 Other specified abnormal findings of blood chemistry: Secondary | ICD-10-CM | POA: Diagnosis not present

## 2016-01-26 DIAGNOSIS — Z1159 Encounter for screening for other viral diseases: Secondary | ICD-10-CM

## 2016-01-26 DIAGNOSIS — R7301 Impaired fasting glucose: Secondary | ICD-10-CM

## 2016-01-26 DIAGNOSIS — E559 Vitamin D deficiency, unspecified: Secondary | ICD-10-CM

## 2016-01-26 DIAGNOSIS — E039 Hypothyroidism, unspecified: Secondary | ICD-10-CM

## 2016-01-26 LAB — COMPREHENSIVE METABOLIC PANEL
ALBUMIN: 4.2 g/dL (ref 3.5–5.2)
ALT: 25 U/L (ref 0–35)
AST: 21 U/L (ref 0–37)
Alkaline Phosphatase: 45 U/L (ref 39–117)
BUN: 13 mg/dL (ref 6–23)
CHLORIDE: 105 meq/L (ref 96–112)
CO2: 29 meq/L (ref 19–32)
Calcium: 9.1 mg/dL (ref 8.4–10.5)
Creatinine, Ser: 0.88 mg/dL (ref 0.40–1.20)
GFR: 71 mL/min (ref >60.00)
Glucose, Bld: 108 mg/dL — ABNORMAL HIGH (ref 70–99)
POTASSIUM: 4 meq/L (ref 3.5–5.1)
Sodium: 139 mEq/L (ref 135–145)
Total Bilirubin: 0.3 mg/dL (ref 0.2–1.2)
Total Protein: 6.9 g/dL (ref 6.0–8.3)

## 2016-01-26 LAB — T3, FREE: T3, Free: 2.6 pg/mL (ref 2.3–4.2)

## 2016-01-26 LAB — VITAMIN D 25 HYDROXY (VIT D DEFICIENCY, FRACTURES): VITD: 21.09 ng/mL — ABNORMAL LOW (ref 30.00–100.00)

## 2016-01-26 LAB — LIPID PANEL
CHOL/HDL RATIO: 5
CHOLESTEROL: 200 mg/dL (ref 0–200)
HDL: 42 mg/dL (ref >39.00)
NonHDL: 157.92
TRIGLYCERIDES: 256 mg/dL — AB (ref 0.0–149.0)
VLDL: 51.2 mg/dL — AB (ref 0.0–40.0)

## 2016-01-26 LAB — LDL CHOLESTEROL, DIRECT: LDL DIRECT: 114 mg/dL

## 2016-01-26 LAB — HEMOGLOBIN A1C: HEMOGLOBIN A1C: 5.8 % (ref 4.6–6.5)

## 2016-01-26 LAB — TSH: TSH: 0.17 u[IU]/mL — ABNORMAL LOW (ref 0.35–4.50)

## 2016-01-26 LAB — T4, FREE: Free T4: 0.33 ng/dL — ABNORMAL LOW (ref 0.60–1.60)

## 2016-01-27 LAB — HEPATITIS C ANTIBODY: HCV AB: NEGATIVE

## 2016-01-29 ENCOUNTER — Encounter: Payer: Self-pay | Admitting: Family Medicine

## 2016-01-29 ENCOUNTER — Ambulatory Visit (INDEPENDENT_AMBULATORY_CARE_PROVIDER_SITE_OTHER): Payer: BLUE CROSS/BLUE SHIELD | Admitting: Family Medicine

## 2016-01-29 VITALS — BP 160/96 | HR 70 | Temp 98.4°F | Ht 64.5 in | Wt 220.5 lb

## 2016-01-29 DIAGNOSIS — Z23 Encounter for immunization: Secondary | ICD-10-CM | POA: Diagnosis not present

## 2016-01-29 DIAGNOSIS — F331 Major depressive disorder, recurrent, moderate: Secondary | ICD-10-CM

## 2016-01-29 DIAGNOSIS — R7301 Impaired fasting glucose: Secondary | ICD-10-CM | POA: Diagnosis not present

## 2016-01-29 DIAGNOSIS — E039 Hypothyroidism, unspecified: Secondary | ICD-10-CM | POA: Diagnosis not present

## 2016-01-29 DIAGNOSIS — E559 Vitamin D deficiency, unspecified: Secondary | ICD-10-CM

## 2016-01-29 DIAGNOSIS — Z0000 Encounter for general adult medical examination without abnormal findings: Secondary | ICD-10-CM

## 2016-01-29 DIAGNOSIS — E782 Mixed hyperlipidemia: Secondary | ICD-10-CM

## 2016-01-29 DIAGNOSIS — I10 Essential (primary) hypertension: Secondary | ICD-10-CM

## 2016-01-29 MED ORDER — VITAMIN D (ERGOCALCIFEROL) 1.25 MG (50000 UNIT) PO CAPS: 50000.0000 [IU] | ORAL_CAPSULE | ORAL | 0 refills | Status: DC

## 2016-01-29 MED ORDER — LOSARTAN POTASSIUM-HCTZ 50-12.5 MG PO TABS: 1.0000 | ORAL_TABLET | Freq: Every day | ORAL | 3 refills | Status: DC

## 2016-01-29 NOTE — Assessment & Plan Note (Addendum)
Inadequate control on lisinopril. Stop lisinopril. Change to losartan HCTZ. Follow BP at home.. Call if > 140/90.

## 2016-01-29 NOTE — Addendum Note (Signed)
Addended by: Carter Kitten on: 01/29/2016 09:59 AM   Modules accepted: Orders

## 2016-01-29 NOTE — Progress Notes (Signed)
Subjective:    Patient ID: Jaime Chapman, female    DOB: 1960-11-02, 55 y.o.   MRN: [id number]  HPI  The patient is here for annual wellness exam and preventative care.    She is currently being treated for chronic lyme disease by alternative med doctor ( Dr. Julien Nordmann) with many weeks of doxycycline and amoxicillin. 8 week protocol. They are also treating her with progesterone and nature thyroid.   She reports having a lot of fatigue and muscle soreness.  Has appt with Alternative doctor.   Major depression, recurrent: Stable control on lexapro 10 mg daily.   She is having shortness of breath with exertion x months, but no wheeze. Does not feels like it is due to asthma.  Episodesd 5 timeds a week of SOB with exertion make her feel panicky.  no chest pain, has noted swelling in feet and ankles in last 1-2 week.   Hypertension:   Poor control on lisinopril  20 mg daily but did not take lisinopril this AM. BP Readings from Last 3 Encounters:  01/29/16 (!) 160/96  12/03/15 128/70  11/30/15 145/75  Using medication without problems : dry cough, dry throat with ACEIor lightheadedness:none  Chest pain with exertion: none Edema: Yes Short of breath:see above Average home BPs: 140/100 at last alternative med OV. Other issues:  Wt Readings from Last 3 Encounters:  01/29/16 220 lb 8 oz (100 kg)  12/03/15 215 lb (97.5 kg)  11/30/15 216 lb (98 kg)    Elevated Cholesterol:  LDL at goal < 130 on no meds, trig remain elevated. Lab Results  Component Value Date   CHOL 200 01/26/2016   HDL 42.00 01/26/2016   LDLCALC 107 (H) 07/05/2013   LDLDIRECT 114.0 01/26/2016   TRIG 256.0 (H) 01/26/2016   CHOLHDL 5 01/26/2016  Using medications without problems: Muscle aches:  Diet compliance: moderate Exercise: none Other complaints:  Prediabetes: stable  Lab Results  Component Value Date   HGBA1C 5.8 01/26/2016   Hypothyroidism.  TSH low, free T4 low, T3 nml on  Nature thyroid..  She has been out of nature thyroid.. As it is back ordered. She plans to get from alternative medicine doctor.   Vit D low.. On no supplement.  Social History /Family History/Past Medical History reviewed and updated if needed. Patient Care Team: Jinny Sanders, MD as PCP - General (Family Medicine) Bryson Ha Himmelrich, RD (Inactive) as Dietitian (Bariatrics) Bo Merino, MD as Consulting Physician (Rheumatology) Larey Dresser, MD as Consulting Physician (Cardiology) Review of Systems     Objective:   Physical Exam  Constitutional: Vital signs are normal. She appears well-developed and well-nourished. She is cooperative.  Non-toxic appearance. She does not appear ill. No distress.  Obese   HENT:  Head: Normocephalic.  Right Ear: Hearing, tympanic membrane, external ear and ear canal normal.  Left Ear: Hearing, tympanic membrane, external ear and ear canal normal.  Nose: Nose normal.  Eyes: Conjunctivae, EOM and lids are normal. Pupils are equal, round, and reactive to light. Lids are everted and swept, no foreign bodies found.  Neck: Trachea normal and normal range of motion. Neck supple. Carotid bruit is not present. No thyroid mass and no thyromegaly present.  Cardiovascular: Normal rate, regular rhythm, S1 normal, S2 normal, normal heart sounds and intact distal pulses.  Exam reveals no gallop.   No murmur heard. Pulmonary/Chest: Effort normal and breath sounds normal. No respiratory distress. She has no wheezes. She has no rhonchi. She  has no rales.  Abdominal: Soft. Normal appearance and bowel sounds are normal. She exhibits no distension, no fluid wave, no abdominal bruit and no mass. There is no hepatosplenomegaly. There is no tenderness. There is no rebound, no guarding and no CVA tenderness. No hernia.  Lymphadenopathy:    She has no cervical adenopathy.    She has no axillary adenopathy.  Neurological: She is alert. She has normal strength. No cranial nerve deficit or  sensory deficit.  Skin: Skin is warm, dry and intact. No rash noted.  Psychiatric: Her speech is normal and behavior is normal. Judgment normal. Her mood appears not anxious. Cognition and memory are normal. She does not exhibit a depressed mood.          Assessment & Plan:  The patient's preventative maintenance and recommended screening tests for an annual wellness exam were reviewed in full today. Brought up to date unless services declined.  Counselled on the importance of diet, exercise, and its role in overall health and mortality. The patient's FH and SH was reviewed, including their home life, tobacco status, and drug and alcohol status.   Vaccines: Uptodate with flu, due for tdap Pap/DVE:  Per Dr. Nori Riis GYN Mammo:  Per Dr. Marita [place] Density:low risk, plan  Age 39 Colon: Dr. Fuller Plan 2012 hyperplastic polyp, repeat 10 year  Smoking Status: former smoker 15 pack year history ETOH/ drug use: none  Hep C:  neg  HIV screen:   refused

## 2016-01-29 NOTE — Assessment & Plan Note (Signed)
Nature thyroid to be adjusted by alt doc.

## 2016-01-29 NOTE — Patient Instructions (Addendum)
Stop lisinopril. Change to losartan HCTZ.  Follow BP at home.. Call if > 140/90.  Discuss thyroid with Dr. Julien Nordmann.  Work on exercise, weight loss, healthy eating habits. Replete vit D x 12 weeks.

## 2016-01-29 NOTE — Progress Notes (Signed)
Pre visit review using our clinic review tool, if applicable. No additional management support is needed unless otherwise documented below in the visit note. 

## 2016-01-29 NOTE — Assessment & Plan Note (Signed)
Stable control on lexapro. Occ panic attacks if she is short of breath.

## 2016-01-29 NOTE — Assessment & Plan Note (Signed)
Well controlled. Continue current medication.  

## 2016-01-29 NOTE — Assessment & Plan Note (Signed)
Replete

## 2016-02-02 DIAGNOSIS — R5383 Other fatigue: Secondary | ICD-10-CM | POA: Diagnosis not present

## 2016-02-02 DIAGNOSIS — A6920 Lyme disease, unspecified: Secondary | ICD-10-CM | POA: Diagnosis not present

## 2016-02-02 DIAGNOSIS — E039 Hypothyroidism, unspecified: Secondary | ICD-10-CM | POA: Diagnosis not present

## 2016-02-02 DIAGNOSIS — E782 Mixed hyperlipidemia: Secondary | ICD-10-CM | POA: Diagnosis not present

## 2016-02-09 ENCOUNTER — Encounter (HOSPITAL_COMMUNITY): Payer: Self-pay | Admitting: Emergency Medicine

## 2016-02-09 DIAGNOSIS — E119 Type 2 diabetes mellitus without complications: Secondary | ICD-10-CM | POA: Diagnosis not present

## 2016-02-09 DIAGNOSIS — E039 Hypothyroidism, unspecified: Secondary | ICD-10-CM | POA: Diagnosis not present

## 2016-02-09 DIAGNOSIS — J45909 Unspecified asthma, uncomplicated: Secondary | ICD-10-CM | POA: Insufficient documentation

## 2016-02-09 DIAGNOSIS — R51 Headache: Secondary | ICD-10-CM | POA: Diagnosis not present

## 2016-02-09 DIAGNOSIS — Z87891 Personal history of nicotine dependence: Secondary | ICD-10-CM | POA: Diagnosis not present

## 2016-02-09 DIAGNOSIS — I10 Essential (primary) hypertension: Secondary | ICD-10-CM | POA: Insufficient documentation

## 2016-02-09 LAB — COMPREHENSIVE METABOLIC PANEL
ALK PHOS: 71 U/L (ref 38–126)
ALT: 32 U/L (ref 14–54)
ANION GAP: 10 (ref 5–15)
AST: 26 U/L (ref 15–41)
Albumin: 4.5 g/dL (ref 3.5–5.0)
BUN: 13 mg/dL (ref 6–20)
CALCIUM: 9.6 mg/dL (ref 8.9–10.3)
CO2: 25 mmol/L (ref 22–32)
Chloride: 103 mmol/L (ref 101–111)
Creatinine, Ser: 0.91 mg/dL (ref 0.44–1.00)
Glucose, Bld: 145 mg/dL — ABNORMAL HIGH (ref 65–99)
Potassium: 3.5 mmol/L (ref 3.5–5.1)
SODIUM: 138 mmol/L (ref 135–145)
Total Bilirubin: 0.6 mg/dL (ref 0.3–1.2)
Total Protein: 7.3 g/dL (ref 6.5–8.1)

## 2016-02-09 LAB — URINALYSIS, ROUTINE W REFLEX MICROSCOPIC
Bilirubin Urine: NEGATIVE
Glucose, UA: NEGATIVE mg/dL
Hgb urine dipstick: NEGATIVE
KETONES UR: NEGATIVE mg/dL
LEUKOCYTES UA: NEGATIVE
NITRITE: NEGATIVE
PH: 6 (ref 5.0–8.0)
PROTEIN: NEGATIVE mg/dL
Specific Gravity, Urine: 1.015 (ref 1.005–1.030)

## 2016-02-09 LAB — CBC WITH DIFFERENTIAL/PLATELET
Basophils Absolute: 0 10*3/uL (ref 0.0–0.1)
Basophils Relative: 0 %
EOS ABS: 0.2 10*3/uL (ref 0.0–0.7)
EOS PCT: 2 %
HCT: 43.1 % (ref 36.0–46.0)
HEMOGLOBIN: 14.6 g/dL (ref 12.0–15.0)
LYMPHS ABS: 2.6 10*3/uL (ref 0.7–4.0)
Lymphocytes Relative: 34 %
MCH: 29 pg (ref 26.0–34.0)
MCHC: 33.9 g/dL (ref 30.0–36.0)
MCV: 85.7 fL (ref 78.0–100.0)
MONOS PCT: 5 %
Monocytes Absolute: 0.4 10*3/uL (ref 0.1–1.0)
Neutro Abs: 4.5 10*3/uL (ref 1.7–7.7)
Neutrophils Relative %: 59 %
PLATELETS: 238 10*3/uL (ref 150–400)
RBC: 5.03 MIL/uL (ref 3.87–5.11)
RDW: 13.4 % (ref 11.5–15.5)
WBC: 7.6 10*3/uL (ref 4.0–10.5)

## 2016-02-09 NOTE — ED Triage Notes (Signed)
Patient reports elevated blood pressure onset last week aftr PCP changed her medication , she added mild headache and currently taking medication for Lyme disease. Denies fever or chills/respirations unlabored.

## 2016-02-10 ENCOUNTER — Emergency Department (HOSPITAL_COMMUNITY): Payer: BLUE CROSS/BLUE SHIELD

## 2016-02-10 ENCOUNTER — Emergency Department (HOSPITAL_COMMUNITY)
Admission: EM | Admit: 2016-02-10 | Discharge: 2016-02-10 | Disposition: A | Discharge status: Home / Self Care | Payer: BLUE CROSS/BLUE SHIELD | Attending: Emergency Medicine | Admitting: Emergency Medicine

## 2016-02-10 DIAGNOSIS — R519 Headache, unspecified: Secondary | ICD-10-CM

## 2016-02-10 DIAGNOSIS — R51 Headache: Secondary | ICD-10-CM

## 2016-02-10 HISTORY — DX: Lyme disease, unspecified: A69.20

## 2016-02-10 MED ORDER — PROCHLORPERAZINE EDISYLATE 5 MG/ML IJ SOLN
10.0000 mg | Freq: Once | INTRAMUSCULAR | Status: AC
  Administered 2016-02-10: 10 mg via INTRAVENOUS
  Filled 2016-02-10: qty 2

## 2016-02-10 MED ORDER — KETOROLAC TROMETHAMINE 30 MG/ML IJ SOLN
15.0000 mg | Freq: Once | INTRAMUSCULAR | Status: AC
  Administered 2016-02-10: 15 mg via INTRAVENOUS
  Filled 2016-02-10: qty 1

## 2016-02-10 MED ORDER — DIPHENHYDRAMINE HCL 50 MG/ML IJ SOLN
25.0000 mg | Freq: Once | INTRAMUSCULAR | Status: AC
  Administered 2016-02-10: 25 mg via INTRAVENOUS
  Filled 2016-02-10: qty 1

## 2016-02-10 MED ORDER — SODIUM CHLORIDE 0.9 % IV BOLUS (SEPSIS)
1000.0000 mL | Freq: Once | INTRAVENOUS | Status: AC
  Administered 2016-02-10: 1000 mL via INTRAVENOUS

## 2016-02-10 NOTE — ED Notes (Signed)
Pt's visitor came to desk and asked this RN to come evaluate patient. Pt states "I am just dizzy" VS rechecked and stable. Neuro checks are normal. Pt informed that we will get her back to room as soon as possible. Pt appears to be in NAD.

## 2016-02-10 NOTE — ED Provider Notes (Signed)
[facility] DEPT Provider Note   CSN: [id number] Arrival date & time: 02/09/16  2024     History   Chief Complaint Chief Complaint  Patient presents with  . Hypertension    HPI Jaime Chapman is a 55 y.o. female.  HPI   Patient is a 54 year old female with history of hypertension, Lyme disease, asthma, hyperlipidemia, GERD, fibromyalgia and degenerative disc disease who presents the ED with complaints of elevated blood pressure. Patient reports over the past week she has had intermittent headaches which she reports are new. Denies history of headaches or migraines. Patient reports having a constant tingling/sharp stinging pain to the back of her head. Patient reports her headaches have gradual onset and last a few hours and then resolve spontaneously. Denies any aggravating or alleviating factors. She reports sometimes she feels slightly lightheaded from her headaches. She reports this evening she started to feel "off". She reports checking her blood pressure and notes it was elevated at 170/100 at home. Patient reports she has been taking her blood pressure medication as prescribed. She notes her PCP recently switched her from lisinopril to losartan. Pt denies fever, neck stiffness, visual changes, photophobia, CP, SOB, cough, palpitations, abdominal pain, N/V, urinary symptoms, numbness, tingling, weakness, seizures, syncope.  Patient denies taking any medications at home for her headache.  Past Medical History:  Diagnosis Date  . Allergic rhinitis   . Anxiety   . Asthma   . DDD (degenerative disc disease)    also has spinal arthritis  . Depression   . Diabetes mellitus    borderline  . Dyslipidemia   . Fibromyalgia   . GERD (gastroesophageal reflux disease)   . Lung nodules    RIGHT LUNG--LAST EVALUATED BY CHEST CT 02/01/12 - STABLE AND FOLLOW UP PLANNED IN ONE YEAR.  [name] Lyme disease   . Obesity   . Sinusitis   . Stress disorder, acute   . Vitamin D deficiency      Patient Active Problem List   Diagnosis Date Noted  . Major depression, recurrent ([place]) 05/08/2015  . Asthma, chronic obstructive, with acute exacerbation ([place]) 01/28/2015  . Essential hypertension 07/26/2014  . Dizziness 07/26/2014  . Constipation - functional 11/20/2013  . Hypothyroidism 11/16/2013  . H/O ventral hernia repair 05/01/2012  . Pulmonary nodules 01/26/2012  . Overweight(278.02) 01/26/2012  . Impaired fasting glucose 03/04/2011  . Fatty liver disease, nonalcoholic [id number]  . Panic anxiety syndrome 10/21/2010  . VENTRAL HERNIA-twice recurrent 09/08/2009  . Vitamin D deficiency 12/27/2008  . Hyperlipidemia 05/22/2007  . Moderate persistent asthma 05/22/2007  . DEGENERATIVE DISC DISEASE 05/22/2007  . FIBROMYALGIA 05/22/2007  . Allergic rhinitis 04/14/2007  . GERD 04/14/2007    Past Surgical History:  Procedure Laterality Date  . [place]   x 2   . ENDOMETRIAL ABLATION  2009  . HERNIA REPAIR  04/14/12   ventral hernia repair  . Laparoscopic ventral hernia repair w/ mesh  08/2009   Dr. Grandville Silos, [place]  . LAPAROSCOPY  2002  . TUBAL LIGATION    . VENTRAL HERNIA REPAIR  09/2006   Dr. Grandville Silos  . VENTRAL HERNIA REPAIR N/A 04/14/2012   Procedure: Laparoscopic Repair of  Ventral Hernia;  Surgeon: Pedro Earls, MD;  Location: [facility];  Service: General;  Laterality: N/A;  Repair of Recurrent Ventral Hernia    OB History    No data available       Home Medications    Prior to Admission medications  Medication Sig Start Date End Date Taking? Authorizing Provider  albuterol (VENTOLIN HFA) 108 (90 Base) MCG/ACT inhaler INHALE 2 PUFFS INTO THE LUNGS EVERY 4HRS AS NEEDED FOR WHEEZING 08/28/15   Noralee Space, MD  escitalopram (LEXAPRO) 10 MG tablet TAKE 1 TABLET BY MOUTH DAILY 01/12/16   [name] Cletis Athens, MD  estradiol (VIVELLE-DOT) 0.1 MG/24HR patch Place 1 patch onto the skin 2 (two) times a week. 07/07/15   Historical Provider, MD   Fluticasone-Salmeterol (ADVAIR DISKUS) 100-50 MCG/DOSE AEPB Inhale 1 puff into the lungs 2 (two) times daily. 08/28/15   Noralee Space, MD  losartan-hydrochlorothiazide (HYZAAR) 50-12.5 MG tablet Take 1 tablet by mouth daily. 01/29/16   [name] Cletis Athens, MD  MAGNESIUM PO Take 1 tablet by mouth daily.     Historical Provider, MD  montelukast (SINGULAIR) 10 MG tablet Take 1 tablet (10 mg total) by mouth at bedtime. 05/01/14   Noralee Space, MD  progesterone (PROMETRIUM) 100 MG capsule Take 1 tablet by mouth daily x 3 weeks then on stop on the fourth week.  repeat    Historical Provider, MD  Thyroid 48.75 MG TABS Take 1 tablet by mouth daily. Nature-thyroid    Historical Provider, MD  Vitamin D, Ergocalciferol, (DRISDOL) 50000 units CAPS capsule Take 1 capsule (50,000 Units total) by mouth every 7 (seven) days. 01/29/16   Jinny Sanders, MD    Family History Family History  Problem Relation Age of Onset  . Diabetes Mother   . Heart disease Mother 67  . Arthritis Mother   . Lung cancer Maternal Grandmother   . Cancer Maternal Grandmother     lung  . Colon cancer Neg Hx     Social History Social History  Substance Use Topics  . Smoking status: Former Smoker    Quit date: 03/01/2004  . Smokeless tobacco: Never Used  . Alcohol use No     Allergies   Progesterone; Codeine; and Cymbalta [duloxetine hcl]   Review of Systems Review of Systems  Neurological: Positive for light-headedness and headaches.  All other systems reviewed and are negative.    Physical Exam Updated Vital Signs BP 154/95   Pulse 77   Temp 98.1 F (36.7 C) (Oral)   Resp 19   SpO2 97%   Physical Exam  Constitutional: She is oriented to person, place, and time. She appears well-developed and well-nourished.  HENT:  Head: Normocephalic and atraumatic.  Right Ear: Tympanic membrane normal.  Left Ear: Tympanic membrane normal.  Nose: Nose normal. Right sinus exhibits no maxillary sinus tenderness and no frontal  sinus tenderness. Left sinus exhibits no maxillary sinus tenderness and no frontal sinus tenderness.  Mouth/Throat: Uvula is midline, oropharynx is clear and moist and mucous membranes are normal. No oropharyngeal exudate, posterior oropharyngeal edema, posterior oropharyngeal erythema or tonsillar abscesses.  Eyes: Conjunctivae and EOM are normal. Pupils are equal, round, and reactive to light. Right eye exhibits no discharge. Left eye exhibits no discharge. No scleral icterus.  Neck: Normal range of motion. Neck supple.  Cardiovascular: Normal rate, regular rhythm, normal heart sounds and intact distal pulses.   Pulmonary/Chest: Effort normal and breath sounds normal. No respiratory distress. She has no wheezes. She has no rales. She exhibits no tenderness.  Abdominal: Soft. Bowel sounds are normal. She exhibits no distension and no mass. There is no tenderness. There is no rebound and no guarding.  Musculoskeletal: Normal range of motion. She exhibits no edema.  Lymphadenopathy:    She has  no cervical adenopathy.  Neurological: She is alert and oriented to person, place, and time. She has normal strength. No cranial nerve deficit or sensory deficit. She displays a negative Romberg sign. Coordination normal.  Skin: Skin is warm and dry.  Nursing note and vitals reviewed.    ED Treatments / Results  Labs (all labs ordered are listed, but only abnormal results are displayed) Labs Reviewed  COMPREHENSIVE METABOLIC PANEL - Abnormal; Notable for the following:       Result Value   Glucose, Bld 145 (*)    All other components within normal limits  CBC WITH DIFFERENTIAL/PLATELET  URINALYSIS, ROUTINE W REFLEX MICROSCOPIC    EKG  EKG Interpretation  Date/Time:  Tuesday February 10 2016 01:12:00 EST Ventricular Rate:  75 PR Interval:    QRS Duration: 98 QT Interval:  421 QTC Calculation: 471 R Axis:   73 Text Interpretation:  Sinus rhythm Low voltage, precordial leads Borderline T  abnormalities, anterior leads Baseline wander in lead(s) II III aVF V2 No significant change since last tracing Confirmed by Christy Gentles  MD, DONALD ([id number]) on 02/10/2016 1:16:37 AM       Radiology Ct Head Wo Contrast  Result Date: 02/10/2016 CLINICAL DATA:  55 year old female with headache and elevated blood pressure. EXAM: CT HEAD WITHOUT CONTRAST TECHNIQUE: Contiguous axial images were obtained from the base of the skull through the vertex without intravenous contrast. COMPARISON:  None. FINDINGS: Brain: No evidence of acute infarction, hemorrhage, hydrocephalus, extra-axial collection or mass lesion/mass effect. Vascular: No hyperdense vessel or unexpected calcification. Skull: Normal. Negative for fracture or focal lesion. Sinuses/Orbits: Small right maxillary sinus retention cysts or polyps noted. The paranasal sinuses and mastoid air cells are clear. Other: None IMPRESSION: No acute intracranial pathology. Electronically Signed   By: Anner Crete M.D.   On: 02/10/2016 02:53    Procedures Procedures (including critical care time)  Medications Ordered in ED Medications  ketorolac (TORADOL) 30 MG/ML injection 15 mg (15 mg Intravenous Given 02/10/16 0137)  prochlorperazine (COMPAZINE) injection 10 mg (10 mg Intravenous Given 02/10/16 0138)  diphenhydrAMINE (BENADRYL) injection 25 mg (25 mg Intravenous Given 02/10/16 0137)  sodium chloride 0.9 % bolus 1,000 mL (1,000 mLs Intravenous New Bag/Given 02/10/16 0137)     Initial Impression / Assessment and Plan / ED Course  I have reviewed the triage vital signs and the nursing notes.  Pertinent labs & imaging results that were available during my care of the patient were reviewed by me and considered in my medical decision making (see chart for details).  Clinical Course     Patient presents with reported elevated blood pressure at home this evening. Reports she has been taking her home blood pressure medications as prescribed. Reports  having intermittent headaches over the past 2 weeks. Denies history of headaches/migraines. VSS. Exam unremarkable. No neuro deficits. Patient's pressure has remained stable during her time in the ED, systolic BP [id number], diastolic BP [id number]. Patient given IV fluids and migraine cocktail. Due to patient with reported new onset headaches will order CT scan for further evaluation. Labs and urine unremarkable. EKG showed sinus rhythm with no significant change from prior. CT head negative. On reevaluation patient is resting comfortably in bed. She reports complete resolution of her headache and denies any complaints at this time. Repeat blood pressure in the room 116/74. Discussed results and plan for discharge with patient. Advised patient to continue taking her blood pressure medication as prescribed. Advised patient to follow up with her PCP this  week for follow-up evaluation and repeat blood pressure. Discussed return precautions.  Final Clinical Impressions(s) / ED Diagnoses   Final diagnoses:  Acute nonintractable headache, unspecified headache type    New Prescriptions New Prescriptions   No medications on file     Nona Dell, [place] 02/10/16 0345    Ripley Fraise, MD 02/12/16 0010

## 2016-02-10 NOTE — Discharge Instructions (Signed)
Continue taking your home blood pressure medications as prescribed. You may also take tylenol or ibuprofen as prescribed over the counter as needed for your headaches.  Follow up with your primary care provider in the next 4-5 days for repeat evaluation of your blood pressure rechecked. Please return to the Emergency Department if symptoms worsen or new onset of fever, neck stiffness, visual changes, chest pain, difficulty breathing, abdominal pain, vomiting, urinary symptoms, numbness, tingling, weakness, seizures, syncope.

## 2016-02-10 NOTE — ED Notes (Signed)
Pt back from CT. No distress observed. 

## 2016-02-10 NOTE — ED Notes (Signed)
PA at bedside.

## 2016-02-11 ENCOUNTER — Telehealth: Payer: Self-pay | Admitting: Family Medicine

## 2016-02-11 ENCOUNTER — Encounter: Payer: Self-pay | Admitting: Family Medicine

## 2016-02-11 NOTE — Telephone Encounter (Signed)
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 If symptomatic, describe symptoms. ---Caller states that she has had problems with her BP and was in the ER. Her BP was 185/109 about an hour ago. Currently, it is 196/109 with a pulse of 96. Her medication was changed from Lisinopril to Losartan about 2 weeks ago. She does have a slight headache which she rates as only 2 on 0-10 scale. She denies numbness.  Does the patient have any new or worsening symptoms? ---Yes  Will a triage be completed? ---Yes  Related visit to physician within the last 2 weeks? ---Yes  Does the PT have any chronic conditions? (i.e. diabetes, asthma, etc.) ---Yes  List chronic conditions. ---HTN, Pre-Diabetes, Asthma  Is the patient pregnant or possibly pregnant? (Ask all females between the ages of 68-55) ---No  Is this a behavioral health or substance abuse call? ---No     Guidelines    Guideline Title Affirmed Question Affirmed Notes  High Blood Pressure BP ? 180/110    Final Disposition User   See Physician within [place], RN, Alveta Heimlich    Referrals  REFERRED TO PCP OFFICE   Disagree/Comply: Leta Baptist

## 2016-02-11 NOTE — Telephone Encounter (Signed)
Pt has appt 02/13/16 at 10:15 Dr Diona Browner.

## 2016-02-11 NOTE — Telephone Encounter (Signed)
[facility]  Patient Name: Jaime Chapman  DOB: Mar 15, 1960    Initial Comment BP 163/100 after taking her med this morning, headache,    Nurse Assessment  Nurse: Wayne Sever, RN, Tillie Rung Date/Time (Eastern Time): 02/11/2016 9:44:26 AM  Confirm and document reason for call. If symptomatic, describe symptoms. ---Caller states her BP is 163/100, that was 30 minutes after taking her medication. She was in the ER on Monday night for BP issues. She did not have any medications adjusted at that time. She did change from Lisinopril to Losartan 2 weeks ago.  Does the patient have any new or worsening symptoms? ---Yes  Will a triage be completed? ---Yes  Related visit to physician within the last 2 weeks? ---No  Does the PT have any chronic conditions? (i.e. diabetes, asthma, etc.) ---Yes  List chronic conditions. ---HTN, Asthma, Lyme Disease, Fibromyalgia, Degenerative Disc  Is the patient pregnant or possibly pregnant? (Ask all females between the ages of 8-55) ---No  Is this a behavioral health or substance abuse call? ---No     Guidelines    Guideline Title Affirmed Question Affirmed Notes  High Blood Pressure BP ? 160/100    Final Disposition User   See PCP When Office is Open (within 3 days) Wayne Sever, RN, Tillie Rung    Comments  Caller already has appointment on Friday   Referrals  REFERRED TO PCP OFFICE   Disagree/Comply: Comply

## 2016-02-12 ENCOUNTER — Ambulatory Visit (INDEPENDENT_AMBULATORY_CARE_PROVIDER_SITE_OTHER): Payer: BLUE CROSS/BLUE SHIELD | Admitting: Family Medicine

## 2016-02-12 ENCOUNTER — Encounter: Payer: Self-pay | Admitting: Family Medicine

## 2016-02-12 DIAGNOSIS — I10 Essential (primary) hypertension: Secondary | ICD-10-CM

## 2016-02-12 MED ORDER — LOSARTAN POTASSIUM-HCTZ 100-25 MG PO TABS: 1.0000 | ORAL_TABLET | Freq: Every day | ORAL | 3 refills | Status: DC

## 2016-02-12 NOTE — Progress Notes (Signed)
Pre visit review using our clinic review tool, if applicable. No additional management support is needed unless otherwise documented below in the visit note. 

## 2016-02-12 NOTE — Progress Notes (Signed)
   Subjective:    Patient ID: Jaime Chapman, female    DOB: 12/20/1960, 55 y.o.   MRN: [id number]  HPI   55 year old female with history of  HTN presents with elevated BP.   She was seen at ER on 02/10/2016 for elevated BP.  At home it was 170/100  At ER 154/95 Had severe headache and dizziness at that time.  EKG: NSR  Labs CMET,UA nml  Head CT neg  Given migraine cocktail and IV fluids. BP Readings from Last 3 Encounters:  02/12/16 (!) 154/99  02/10/16 122/72  01/29/16 (!) 160/96   Meds not changed in ER.  Yesterday 182/98 at fire station.  We recently switched from lisinopril to losartan HCTZ.  Cough has now improved with the switch.     Review of Systems  Constitutional: Negative for fatigue and fever.  HENT: Negative for ear pain.   Eyes: Negative for pain.  Respiratory: Negative for chest tightness and shortness of breath.   Cardiovascular: Negative for chest pain, palpitations and leg swelling.  Gastrointestinal: Negative for abdominal pain.  Genitourinary: Negative for dysuria.  Neurological: Positive for dizziness and headaches.       Objective:   Physical Exam  Constitutional: Vital signs are normal. She appears well-developed and well-nourished. She is cooperative.  Non-toxic appearance. She does not appear ill. No distress.  HENT:  Head: Normocephalic.  Right Ear: Hearing, tympanic membrane, external ear and ear canal normal. Tympanic membrane is not erythematous, not retracted and not bulging.  Left Ear: Hearing, tympanic membrane, external ear and ear canal normal. Tympanic membrane is not erythematous, not retracted and not bulging.  Nose: No mucosal edema or rhinorrhea. Right sinus exhibits no maxillary sinus tenderness and no frontal sinus tenderness. Left sinus exhibits no maxillary sinus tenderness and no frontal sinus tenderness.  Mouth/Throat: Uvula is midline, oropharynx is clear and moist and mucous membranes are normal.  Eyes: Conjunctivae,  EOM and lids are normal. Pupils are equal, round, and reactive to light. Lids are everted and swept, no foreign bodies found.  Neck: Trachea normal and normal range of motion. Neck supple. Carotid bruit is not present. No thyroid mass and no thyromegaly present.  Cardiovascular: Normal rate, regular rhythm, S1 normal, S2 normal, normal heart sounds, intact distal pulses and normal pulses.  Exam reveals no gallop and no friction rub.   No murmur heard. Pulmonary/Chest: Effort normal and breath sounds normal. No tachypnea. No respiratory distress. She has no decreased breath sounds. She has no wheezes. She has no rhonchi. She has no rales.  Abdominal: Soft. Normal appearance and bowel sounds are normal. There is no tenderness.  Neurological: She is alert.  Skin: Skin is warm, dry and intact. No rash noted.  Psychiatric: Her speech is normal and behavior is normal. Judgment and thought content normal. Her mood appears not anxious. Cognition and memory are normal. She does not exhibit a depressed mood.          Assessment & Plan:

## 2016-02-12 NOTE — Patient Instructions (Signed)
Follow BP daily at home.. Goal < 140/90. Bring measurement to next OV.  Increasing to losartan 100/25 mg daily.

## 2016-02-12 NOTE — Telephone Encounter (Signed)
Pt has appt with Dr Diona Browner 02/12/16 at 11:45.

## 2016-02-12 NOTE — Assessment & Plan Note (Signed)
Increase losartan HCTZ to 100/25 mg daily. Encouraged exercise, weight loss, healthy eating habits. Check Cr at 2 week follow up on BP.

## 2016-02-13 ENCOUNTER — Ambulatory Visit: Payer: BLUE CROSS/BLUE SHIELD | Admitting: Family Medicine

## 2016-02-27 ENCOUNTER — Encounter: Payer: Self-pay | Admitting: Family Medicine

## 2016-02-27 ENCOUNTER — Ambulatory Visit (INDEPENDENT_AMBULATORY_CARE_PROVIDER_SITE_OTHER): Payer: BLUE CROSS/BLUE SHIELD | Admitting: Family Medicine

## 2016-02-27 DIAGNOSIS — I10 Essential (primary) hypertension: Secondary | ICD-10-CM

## 2016-02-27 LAB — BASIC METABOLIC PANEL
BUN: 19 mg/dL (ref 6–23)
CALCIUM: 9.1 mg/dL (ref 8.4–10.5)
CO2: 29 mEq/L (ref 19–32)
Chloride: 102 mEq/L (ref 96–112)
Creatinine, Ser: 0.87 mg/dL (ref 0.40–1.20)
GFR: 71.92 mL/min (ref >60.00)
Glucose, Bld: 129 mg/dL — ABNORMAL HIGH (ref 70–99)
POTASSIUM: 4 meq/L (ref 3.5–5.1)
SODIUM: 139 meq/L (ref 135–145)

## 2016-02-27 NOTE — Progress Notes (Signed)
   Subjective:    Patient ID: Jaime Chapman, female    DOB: [birth year], 55 y.o.   MRN: [id number]  HPI  55 year old female presents for 2 week follow up HTN  Hypertension:   At last OV  Increase losartan HCTZ to 100/25 mg daily.  Due for creatinine check today. Blood pressure is much better where today with the increase in medication. BP Readings from Last 3 Encounters:  02/27/16 136/86  02/12/16 (!) 154/99  02/10/16 122/72  Using medication without problems or lightheadedness:  Chest pain with exertion: None Edema:None Short of breath:None Average home BPs:  134/80s Other issues:  Cutting back on salt.   Minimal exercise.  She has been in a lot of pain from fibromyalgia, lyme disease.. Better in last few days improved.   Review of Systems  Constitutional: Negative for fatigue and fever.  HENT: Negative for ear pain.   Eyes: Negative for pain.  Respiratory: Negative for chest tightness and shortness of breath.   Cardiovascular: Negative for chest pain, palpitations and leg swelling.  Gastrointestinal: Negative for abdominal pain.  Genitourinary: Negative for dysuria.       Objective:   Physical Exam  Constitutional: Vital signs are normal. She appears well-developed and well-nourished. She is cooperative.  Non-toxic appearance. She does not appear ill. No distress.  overweight  HENT:  Head: Normocephalic.  Right Ear: Hearing, tympanic membrane, external ear and ear canal normal. Tympanic membrane is not erythematous, not retracted and not bulging.  Left Ear: Hearing, tympanic membrane, external ear and ear canal normal. Tympanic membrane is not erythematous, not retracted and not bulging.  Nose: No mucosal edema or rhinorrhea. Right sinus exhibits no maxillary sinus tenderness and no frontal sinus tenderness. Left sinus exhibits no maxillary sinus tenderness and no frontal sinus tenderness.  Mouth/Throat: Uvula is midline, oropharynx is clear and moist and mucous  membranes are normal.  Eyes: Conjunctivae, EOM and lids are normal. Pupils are equal, round, and reactive to light. Lids are everted and swept, no foreign bodies found.  Neck: Trachea normal and normal range of motion. Neck supple. Carotid bruit is not present. No thyroid mass and no thyromegaly present.  Cardiovascular: Normal rate, regular rhythm, S1 normal, S2 normal, normal heart sounds, intact distal pulses and normal pulses.  Exam reveals no gallop and no friction rub.   No murmur heard. Pulmonary/Chest: Effort normal and breath sounds normal. No tachypnea. No respiratory distress. She has no decreased breath sounds. She has no wheezes. She has no rhonchi. She has no rales.  Abdominal: Soft. Normal appearance and bowel sounds are normal. There is no tenderness.  Neurological: She is alert.  Skin: Skin is warm, dry and intact. No rash noted.  Psychiatric: Her speech is normal and behavior is normal. Judgment and thought content normal. Her mood appears not anxious. Cognition and memory are normal. She does not exhibit a depressed mood.          Assessment & Plan:

## 2016-02-27 NOTE — Assessment & Plan Note (Signed)
Improved control on higher dose of medication. Check Cr today. Encouraged exercise, weight loss, healthy eating habits.

## 2016-02-27 NOTE — Patient Instructions (Addendum)
Please stop at  lab  to have labs drawn. Keep working on healthy eating, regular exercsie and weight loss.  Continue current dose of medication.

## 2016-02-27 NOTE — Progress Notes (Signed)
Pre visit review using our clinic review tool, if applicable. No additional management support is needed unless otherwise documented below in the visit note. 

## 2016-03-11 DIAGNOSIS — H903 Sensorineural hearing loss, bilateral: Secondary | ICD-10-CM | POA: Diagnosis not present

## 2016-04-05 DIAGNOSIS — F419 Anxiety disorder, unspecified: Secondary | ICD-10-CM | POA: Diagnosis not present

## 2016-04-05 DIAGNOSIS — I10 Essential (primary) hypertension: Secondary | ICD-10-CM | POA: Diagnosis not present

## 2016-04-05 DIAGNOSIS — A6920 Lyme disease, unspecified: Secondary | ICD-10-CM | POA: Diagnosis not present

## 2016-04-05 DIAGNOSIS — E039 Hypothyroidism, unspecified: Secondary | ICD-10-CM | POA: Diagnosis not present

## 2016-04-05 DIAGNOSIS — R0602 Shortness of breath: Secondary | ICD-10-CM | POA: Diagnosis not present

## 2016-04-09 ENCOUNTER — Ambulatory Visit (INDEPENDENT_AMBULATORY_CARE_PROVIDER_SITE_OTHER): Payer: BLUE CROSS/BLUE SHIELD | Admitting: Family Medicine

## 2016-04-09 ENCOUNTER — Encounter: Payer: Self-pay | Admitting: Family Medicine

## 2016-04-09 VITALS — BP 120/86 | HR 71 | Temp 98.6°F | Ht 64.5 in | Wt 229.5 lb

## 2016-04-09 DIAGNOSIS — F410 Panic disorder [episodic paroxysmal anxiety] without agoraphobia: Secondary | ICD-10-CM | POA: Diagnosis not present

## 2016-04-09 DIAGNOSIS — F331 Major depressive disorder, recurrent, moderate: Secondary | ICD-10-CM

## 2016-04-09 DIAGNOSIS — F411 Generalized anxiety disorder: Secondary | ICD-10-CM

## 2016-04-09 NOTE — Progress Notes (Signed)
Pre visit review using our clinic review tool, if applicable. No additional management support is needed unless otherwise documented below in the visit note. 

## 2016-04-09 NOTE — Assessment & Plan Note (Signed)
Stable control on lexapro. 

## 2016-04-09 NOTE — Assessment & Plan Note (Signed)
Inadequate control. Refer to counselor and increase lexapro to 20 mg daily.

## 2016-04-09 NOTE — Progress Notes (Signed)
   Subjective:    Patient ID: Jaime Chapman, female    DOB: 01-03-1961, 56 y.o.   MRN: [id number]  HPI   56 year old female with history of recurrent major depression and generalized anxiety presents with worsening anxiety. She has noted worsening in  Last few weeks.  She has been having panic attacks.. 3 in last 2 weeks.  more anxious and restless day to do.   When she gets short of breath  With exercise and heart rate increasing.. Triggers panic. She is fatigued from her chronic lyme disease diagnosis.  GAD:12 PHQ9: 6   She has NOT really been under more stress lately.  Few years ago she was seeing therapist... Helped some.  She is currently on lexapro 10 mg daily.    Review of Systems  Constitutional: Negative for fatigue and fever.  HENT: Negative for ear pain.   Eyes: Negative for pain.  Respiratory: Negative for chest tightness and shortness of breath.   Cardiovascular: Negative for chest pain, palpitations and leg swelling.  Gastrointestinal: Negative for abdominal pain.  Genitourinary: Negative for dysuria.       Objective:   Physical Exam  Constitutional: Vital signs are normal. She appears well-developed and well-nourished. She is cooperative.  Non-toxic appearance. She does not appear ill. No distress.  HENT:  Head: Normocephalic.  Right Ear: Hearing, tympanic membrane, external ear and ear canal normal. Tympanic membrane is not erythematous, not retracted and not bulging.  Left Ear: Hearing, tympanic membrane, external ear and ear canal normal. Tympanic membrane is not erythematous, not retracted and not bulging.  Nose: No mucosal edema or rhinorrhea. Right sinus exhibits no maxillary sinus tenderness and no frontal sinus tenderness. Left sinus exhibits no maxillary sinus tenderness and no frontal sinus tenderness.  Mouth/Throat: Uvula is midline, oropharynx is clear and moist and mucous membranes are normal.  Eyes: Conjunctivae, EOM and lids are normal.  Pupils are equal, round, and reactive to light. Lids are everted and swept, no foreign bodies found.  Neck: Trachea normal and normal range of motion. Neck supple. Carotid bruit is not present. No thyroid mass and no thyromegaly present.  Cardiovascular: Normal rate, regular rhythm, S1 normal, S2 normal, normal heart sounds, intact distal pulses and normal pulses.  Exam reveals no gallop and no friction rub.   No murmur heard. Pulmonary/Chest: Effort normal and breath sounds normal. No tachypnea. No respiratory distress. She has no decreased breath sounds. She has no wheezes. She has no rhonchi. She has no rales.  Abdominal: Soft. Normal appearance and bowel sounds are normal. There is no tenderness.  Neurological: She is alert.  Skin: Skin is warm, dry and intact. No rash noted.  Psychiatric: Her speech is normal and behavior is normal. Judgment and thought content normal. Her mood appears not anxious. Cognition and memory are normal. She does not exhibit a depressed mood.          Assessment & Plan:

## 2016-04-09 NOTE — Assessment & Plan Note (Signed)
Refer for relaxation techniques to treat and increase SSRI.

## 2016-04-09 NOTE — Patient Instructions (Addendum)
Increase lexapro to 20 mg daily.  Stop at front desk on way out for referral.

## 2016-04-21 ENCOUNTER — Encounter: Payer: Self-pay | Admitting: Family Medicine

## 2016-04-21 ENCOUNTER — Other Ambulatory Visit: Payer: Self-pay | Admitting: Pulmonary Disease

## 2016-04-22 MED ORDER — ESCITALOPRAM OXALATE 20 MG PO TABS: 20.0000 mg | ORAL_TABLET | Freq: Every day | ORAL | 1 refills | Status: DC

## 2016-05-07 ENCOUNTER — Ambulatory Visit (INDEPENDENT_AMBULATORY_CARE_PROVIDER_SITE_OTHER): Payer: BLUE CROSS/BLUE SHIELD | Admitting: Family Medicine

## 2016-05-07 ENCOUNTER — Encounter: Payer: Self-pay | Admitting: Family Medicine

## 2016-05-07 DIAGNOSIS — F331 Major depressive disorder, recurrent, moderate: Secondary | ICD-10-CM | POA: Diagnosis not present

## 2016-05-07 DIAGNOSIS — F411 Generalized anxiety disorder: Secondary | ICD-10-CM

## 2016-05-07 DIAGNOSIS — F410 Panic disorder [episodic paroxysmal anxiety] without agoraphobia: Secondary | ICD-10-CM

## 2016-05-07 MED ORDER — ESCITALOPRAM OXALATE 20 MG PO TABS: 20.0000 mg | ORAL_TABLET | Freq: Every day | ORAL | 1 refills | Status: DC

## 2016-05-07 NOTE — Assessment & Plan Note (Signed)
Tolerable control on current regimen. 

## 2016-05-07 NOTE — Assessment & Plan Note (Signed)
Minimal episodes in last month. Pt receiving counseling for stress reduction and  Relaxation techniques.

## 2016-05-07 NOTE — Patient Instructions (Signed)
Call if fatigue is not improving.  Continue current dose of lexapro at bedtime.

## 2016-05-07 NOTE — Progress Notes (Signed)
Pre visit review using our clinic review tool, if applicable. No additional management support is needed unless otherwise documented below in the visit note. 

## 2016-05-07 NOTE — Progress Notes (Signed)
   Subjective:    Patient ID: Jaime Chapman, female    DOB: 1960-03-11, 56 y.o.   MRN: [id number]  HPI  56 year old female patient presents for follow up 4 weeks on mood.  At last OV on 2/9 she was Dx with  Worsening recurrent major depression worsening GAD and panic attacks.  Her lexapro was increased to 20 mg daily and she was referred to a counselor.  Today she reports she has had improvement in mood.   PHQ9 improved from 6 to 5. GAD7 from 12 to 0.  She has had less panic attacks on  The higher dose of lexapro. She has only had one panic attack in last month.  Sleeping well at night.  She is still fatigued from her chronic lyme disease.     Review of Systems  Constitutional: Positive for fatigue. Negative for fever.  HENT: Negative for ear pain.   Eyes: Negative for pain.  Respiratory: Negative for chest tightness and shortness of breath.   Cardiovascular: Negative for chest pain, palpitations and leg swelling.  Gastrointestinal: Negative for abdominal pain.  Genitourinary: Negative for dysuria.       Objective:   Physical Exam  Constitutional: Vital signs are normal. She appears well-developed and well-nourished. She is cooperative.  Non-toxic appearance. She does not appear ill. No distress.  obesity  HENT:  Head: Normocephalic.  Right Ear: Hearing, tympanic membrane, external ear and ear canal normal. Tympanic membrane is not erythematous, not retracted and not bulging.  Left Ear: Hearing, tympanic membrane, external ear and ear canal normal. Tympanic membrane is not erythematous, not retracted and not bulging.  Nose: No mucosal edema or rhinorrhea. Right sinus exhibits no maxillary sinus tenderness and no frontal sinus tenderness. Left sinus exhibits no maxillary sinus tenderness and no frontal sinus tenderness.  Mouth/Throat: Uvula is midline, oropharynx is clear and moist and mucous membranes are normal.  Eyes: Conjunctivae, EOM and lids are normal. Pupils are  equal, round, and reactive to light. Lids are everted and swept, no foreign bodies found.  Neck: Trachea normal and normal range of motion. Neck supple. Carotid bruit is not present. No thyroid mass and no thyromegaly present.  Cardiovascular: Normal rate, regular rhythm, S1 normal, S2 normal, normal heart sounds, intact distal pulses and normal pulses.  Exam reveals no gallop and no friction rub.   No murmur heard. Pulmonary/Chest: Effort normal and breath sounds normal. No tachypnea. No respiratory distress. She has no decreased breath sounds. She has no wheezes. She has no rhonchi. She has no rales.  Abdominal: Soft. Normal appearance and bowel sounds are normal. There is no tenderness.  Neurological: She is alert.  Skin: Skin is warm, dry and intact. No rash noted.  Psychiatric: Her speech is normal and behavior is normal. Judgment and thought content normal. Her mood appears not anxious. Cognition and memory are normal. She does not exhibit a depressed mood.          Assessment & Plan:

## 2016-05-07 NOTE — Assessment & Plan Note (Signed)
Significant improvement in last month on higher dose of lexapro.

## 2016-05-17 ENCOUNTER — Other Ambulatory Visit: Payer: Self-pay | Admitting: Family Medicine

## 2016-06-07 ENCOUNTER — Ambulatory Visit (INDEPENDENT_AMBULATORY_CARE_PROVIDER_SITE_OTHER): Payer: BLUE CROSS/BLUE SHIELD | Admitting: Psychology

## 2016-06-07 DIAGNOSIS — F411 Generalized anxiety disorder: Secondary | ICD-10-CM

## 2016-06-10 DIAGNOSIS — N951 Menopausal and female climacteric states: Secondary | ICD-10-CM | POA: Diagnosis not present

## 2016-06-10 DIAGNOSIS — A6920 Lyme disease, unspecified: Secondary | ICD-10-CM | POA: Diagnosis not present

## 2016-06-10 DIAGNOSIS — R0602 Shortness of breath: Secondary | ICD-10-CM | POA: Diagnosis not present

## 2016-06-10 DIAGNOSIS — E039 Hypothyroidism, unspecified: Secondary | ICD-10-CM | POA: Diagnosis not present

## 2016-06-15 ENCOUNTER — Other Ambulatory Visit: Payer: Self-pay | Admitting: Family Medicine

## 2016-06-21 ENCOUNTER — Ambulatory Visit (INDEPENDENT_AMBULATORY_CARE_PROVIDER_SITE_OTHER): Payer: BLUE CROSS/BLUE SHIELD | Admitting: Psychology

## 2016-06-21 DIAGNOSIS — F411 Generalized anxiety disorder: Secondary | ICD-10-CM

## 2016-06-28 ENCOUNTER — Other Ambulatory Visit: Payer: Self-pay | Admitting: Family Medicine

## 2016-07-05 ENCOUNTER — Ambulatory Visit (INDEPENDENT_AMBULATORY_CARE_PROVIDER_SITE_OTHER): Payer: BLUE CROSS/BLUE SHIELD | Admitting: Psychology

## 2016-07-05 DIAGNOSIS — F411 Generalized anxiety disorder: Secondary | ICD-10-CM

## 2016-07-19 ENCOUNTER — Ambulatory Visit (INDEPENDENT_AMBULATORY_CARE_PROVIDER_SITE_OTHER): Payer: BLUE CROSS/BLUE SHIELD | Admitting: Psychology

## 2016-07-19 DIAGNOSIS — F411 Generalized anxiety disorder: Secondary | ICD-10-CM

## 2016-07-27 ENCOUNTER — Other Ambulatory Visit: Payer: Self-pay | Admitting: Family Medicine

## 2016-08-02 ENCOUNTER — Ambulatory Visit (INDEPENDENT_AMBULATORY_CARE_PROVIDER_SITE_OTHER): Payer: BLUE CROSS/BLUE SHIELD | Admitting: Psychology

## 2016-08-02 DIAGNOSIS — F411 Generalized anxiety disorder: Secondary | ICD-10-CM

## 2016-08-16 ENCOUNTER — Ambulatory Visit (INDEPENDENT_AMBULATORY_CARE_PROVIDER_SITE_OTHER): Payer: BLUE CROSS/BLUE SHIELD | Admitting: Psychology

## 2016-08-16 DIAGNOSIS — F411 Generalized anxiety disorder: Secondary | ICD-10-CM

## 2016-08-30 ENCOUNTER — Ambulatory Visit (INDEPENDENT_AMBULATORY_CARE_PROVIDER_SITE_OTHER): Payer: BLUE CROSS/BLUE SHIELD | Admitting: Psychology

## 2016-08-30 DIAGNOSIS — F411 Generalized anxiety disorder: Secondary | ICD-10-CM

## 2016-08-31 ENCOUNTER — Encounter: Payer: Self-pay | Admitting: Pulmonary Disease

## 2016-08-31 ENCOUNTER — Ambulatory Visit (INDEPENDENT_AMBULATORY_CARE_PROVIDER_SITE_OTHER)
Admission: RE | Admit: 2016-08-31 | Discharge: 2016-08-31 | Disposition: A | Discharge status: Home / Self Care | Payer: BLUE CROSS/BLUE SHIELD | Source: Ambulatory Visit | Attending: Pulmonary Disease | Admitting: Pulmonary Disease

## 2016-08-31 ENCOUNTER — Ambulatory Visit (INDEPENDENT_AMBULATORY_CARE_PROVIDER_SITE_OTHER): Payer: BLUE CROSS/BLUE SHIELD | Admitting: Pulmonary Disease

## 2016-08-31 VITALS — BP 128/76 | HR 77 | Temp 97.7°F | Ht 64.0 in | Wt 225.4 lb

## 2016-08-31 DIAGNOSIS — J3089 Other allergic rhinitis: Secondary | ICD-10-CM

## 2016-08-31 DIAGNOSIS — R938 Abnormal findings on diagnostic imaging of other specified body structures: Secondary | ICD-10-CM | POA: Diagnosis not present

## 2016-08-31 DIAGNOSIS — R9389 Abnormal findings on diagnostic imaging of other specified body structures: Secondary | ICD-10-CM

## 2016-08-31 DIAGNOSIS — J4540 Moderate persistent asthma, uncomplicated: Secondary | ICD-10-CM

## 2016-08-31 DIAGNOSIS — R918 Other nonspecific abnormal finding of lung field: Secondary | ICD-10-CM | POA: Diagnosis not present

## 2016-08-31 MED ORDER — MONTELUKAST SODIUM 10 MG PO TABS: 10.0000 mg | ORAL_TABLET | Freq: Every day | ORAL | 3 refills | Status: DC

## 2016-08-31 MED ORDER — ALBUTEROL SULFATE HFA 108 (90 BASE) MCG/ACT IN AERS: INHALATION_SPRAY | RESPIRATORY_TRACT | 3 refills | Status: DC

## 2016-08-31 MED ORDER — FLUTICASONE-SALMETEROL 100-50 MCG/DOSE IN AEPB: 1.0000 | INHALATION_SPRAY | Freq: Two times a day (BID) | RESPIRATORY_TRACT | 3 refills | Status: DC

## 2016-08-31 MED ORDER — MUPIROCIN CALCIUM 2 % EX CREA: TOPICAL_CREAM | CUTANEOUS | 11 refills | Status: DC

## 2016-08-31 MED ORDER — CLONAZEPAM 0.5 MG PO TABS: ORAL_TABLET | ORAL | 5 refills | Status: DC

## 2016-08-31 NOTE — Patient Instructions (Signed)
Today we updated your med list in our EPIC system...    Continue your current medications the same...  We refilled your ADVAIR100, VentolinHFA, and Singulair10...  We wrote a new prescription for KLONOPIN 0.5mg  tabs to taske 1/2 to 1 tab TWICE daily on a regular schedule for the shortnees of breath, anxiety, & panic attacks...  Call for any questions...  Let's plan a follow up visit in 91yr, sooner if needed for any acute problems.Marland KitchenMarland Kitchen
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
## 2016-09-07 ENCOUNTER — Other Ambulatory Visit: Payer: Self-pay | Admitting: Pulmonary Disease

## 2016-09-07 MED ORDER — MUPIROCIN CALCIUM 2 % EX CREA: TOPICAL_CREAM | CUTANEOUS | 11 refills | Status: DC

## 2016-09-15 ENCOUNTER — Encounter: Payer: Self-pay | Admitting: *Deleted

## 2016-09-17 DIAGNOSIS — N951 Menopausal and female climacteric states: Secondary | ICD-10-CM | POA: Diagnosis not present

## 2016-09-17 DIAGNOSIS — M2550 Pain in unspecified joint: Secondary | ICD-10-CM | POA: Diagnosis not present

## 2016-09-17 DIAGNOSIS — E559 Vitamin D deficiency, unspecified: Secondary | ICD-10-CM | POA: Diagnosis not present

## 2016-09-17 DIAGNOSIS — E039 Hypothyroidism, unspecified: Secondary | ICD-10-CM | POA: Diagnosis not present

## 2016-09-17 DIAGNOSIS — B379 Candidiasis, unspecified: Secondary | ICD-10-CM | POA: Diagnosis not present

## 2016-09-27 ENCOUNTER — Ambulatory Visit (INDEPENDENT_AMBULATORY_CARE_PROVIDER_SITE_OTHER): Payer: BLUE CROSS/BLUE SHIELD | Admitting: Psychology

## 2016-09-27 DIAGNOSIS — F411 Generalized anxiety disorder: Secondary | ICD-10-CM | POA: Diagnosis not present

## 2016-10-06 DIAGNOSIS — Z1231 Encounter for screening mammogram for malignant neoplasm of breast: Secondary | ICD-10-CM | POA: Diagnosis not present

## 2016-10-06 DIAGNOSIS — Z6838 Body mass index (BMI) 38.0-38.9, adult: Secondary | ICD-10-CM | POA: Diagnosis not present

## 2016-10-06 DIAGNOSIS — Z01419 Encounter for gynecological examination (general) (routine) without abnormal findings: Secondary | ICD-10-CM | POA: Diagnosis not present

## 2016-10-11 ENCOUNTER — Ambulatory Visit (INDEPENDENT_AMBULATORY_CARE_PROVIDER_SITE_OTHER): Payer: BLUE CROSS/BLUE SHIELD | Admitting: Psychology

## 2016-10-11 DIAGNOSIS — F411 Generalized anxiety disorder: Secondary | ICD-10-CM

## 2016-10-25 ENCOUNTER — Ambulatory Visit (INDEPENDENT_AMBULATORY_CARE_PROVIDER_SITE_OTHER): Payer: BLUE CROSS/BLUE SHIELD | Admitting: Psychology

## 2016-10-25 DIAGNOSIS — F411 Generalized anxiety disorder: Secondary | ICD-10-CM | POA: Diagnosis not present

## 2016-11-08 ENCOUNTER — Ambulatory Visit (INDEPENDENT_AMBULATORY_CARE_PROVIDER_SITE_OTHER): Payer: BLUE CROSS/BLUE SHIELD | Admitting: Psychology

## 2016-11-08 DIAGNOSIS — F411 Generalized anxiety disorder: Secondary | ICD-10-CM

## 2016-11-22 ENCOUNTER — Ambulatory Visit (INDEPENDENT_AMBULATORY_CARE_PROVIDER_SITE_OTHER): Payer: BLUE CROSS/BLUE SHIELD | Admitting: Psychology

## 2016-11-22 DIAGNOSIS — F411 Generalized anxiety disorder: Secondary | ICD-10-CM | POA: Diagnosis not present

## 2016-12-06 ENCOUNTER — Ambulatory Visit: Payer: BLUE CROSS/BLUE SHIELD | Admitting: Psychology

## 2016-12-17 ENCOUNTER — Other Ambulatory Visit: Payer: Self-pay | Admitting: Family Medicine

## 2016-12-20 ENCOUNTER — Ambulatory Visit: Payer: BLUE CROSS/BLUE SHIELD | Admitting: Psychology

## 2016-12-23 DIAGNOSIS — R5383 Other fatigue: Secondary | ICD-10-CM | POA: Diagnosis not present

## 2016-12-23 DIAGNOSIS — E039 Hypothyroidism, unspecified: Secondary | ICD-10-CM | POA: Diagnosis not present

## 2016-12-23 DIAGNOSIS — A6920 Lyme disease, unspecified: Secondary | ICD-10-CM | POA: Diagnosis not present

## 2016-12-23 DIAGNOSIS — N951 Menopausal and female climacteric states: Secondary | ICD-10-CM | POA: Diagnosis not present

## 2016-12-23 DIAGNOSIS — I10 Essential (primary) hypertension: Secondary | ICD-10-CM | POA: Diagnosis not present

## 2017-01-03 ENCOUNTER — Ambulatory Visit (INDEPENDENT_AMBULATORY_CARE_PROVIDER_SITE_OTHER): Payer: BLUE CROSS/BLUE SHIELD | Admitting: Psychology

## 2017-01-03 DIAGNOSIS — F411 Generalized anxiety disorder: Secondary | ICD-10-CM

## 2017-02-14 ENCOUNTER — Ambulatory Visit (INDEPENDENT_AMBULATORY_CARE_PROVIDER_SITE_OTHER): Payer: Self-pay | Admitting: Psychology

## 2017-02-14 DIAGNOSIS — F411 Generalized anxiety disorder: Secondary | ICD-10-CM

## 2017-03-07 ENCOUNTER — Ambulatory Visit: Payer: Self-pay | Admitting: *Deleted

## 2017-03-07 NOTE — Telephone Encounter (Signed)
   Reason for Disposition . [1] Fatigue (i.e., tires easily, decreased energy) AND [2] persists > 1 week  Answer Assessment - Initial Assessment Questions 1. DESCRIPTION: "Describe how you are feeling."     tired 2. SEVERITY: "How bad is it?"  "Can you stand and walk?"   - MILD - Feels weak or tired, but does not interfere with work, school or normal activities   - [place] to stand and walk; weakness interferes with work, school, or normal activities   - SEVERE - Unable to stand or walk     moderate 3. ONSET:  "When did the weakness begin?"     3 months ago 4. CAUSE: "What do you think is causing the weakness?"     Sleep apnea? 5. MEDICINES: "Have you recently started a new medicine or had a change in the amount of a medicine?"     no 6. OTHER SYMPTOMS: "Do you have any other symptoms?" (e.g., chest pain, fever, cough, SOB, vomiting, diarrhea, bleeding)     SOB, snore bad at night, stop breathing 7. PREGNANCY: "Is there any chance you are pregnant?" "When was your last menstrual period?"     no  Protocols used: WEAKNESS (GENERALIZED) AND FATIGUE-A-AH  Pt c/o fatigue that is interfering with her normal activities. She states that she is sleeping until after noon and still feels like she needs to sleep more.  She had a sleep study done about 10 years ago and feel like she needs to have another one.  Her husband states she snores and will stop breathing at night. Appointment made for her this Thursday. Home care advice given with verbal understanding.

## 2017-03-08 NOTE — Telephone Encounter (Signed)
Noted  

## 2017-03-10 ENCOUNTER — Telehealth: Payer: Self-pay

## 2017-03-10 ENCOUNTER — Encounter: Payer: Self-pay | Admitting: Family Medicine

## 2017-03-10 ENCOUNTER — Ambulatory Visit (INDEPENDENT_AMBULATORY_CARE_PROVIDER_SITE_OTHER): Payer: BLUE CROSS/BLUE SHIELD | Admitting: Family Medicine

## 2017-03-10 VITALS — BP 120/60 | HR 76 | Temp 97.4°F | Ht 64.5 in | Wt 228.5 lb

## 2017-03-10 DIAGNOSIS — I10 Essential (primary) hypertension: Secondary | ICD-10-CM | POA: Diagnosis not present

## 2017-03-10 DIAGNOSIS — R42 Dizziness and giddiness: Secondary | ICD-10-CM

## 2017-03-10 DIAGNOSIS — R5383 Other fatigue: Secondary | ICD-10-CM

## 2017-03-10 DIAGNOSIS — F331 Major depressive disorder, recurrent, moderate: Secondary | ICD-10-CM

## 2017-03-10 DIAGNOSIS — R55 Syncope and collapse: Secondary | ICD-10-CM

## 2017-03-10 DIAGNOSIS — F411 Generalized anxiety disorder: Secondary | ICD-10-CM | POA: Diagnosis not present

## 2017-03-10 DIAGNOSIS — R32 Unspecified urinary incontinence: Secondary | ICD-10-CM | POA: Diagnosis not present

## 2017-03-10 DIAGNOSIS — R0683 Snoring: Secondary | ICD-10-CM

## 2017-03-10 DIAGNOSIS — M797 Fibromyalgia: Secondary | ICD-10-CM | POA: Diagnosis not present

## 2017-03-10 DIAGNOSIS — M79605 Pain in left leg: Secondary | ICD-10-CM

## 2017-03-10 DIAGNOSIS — M79604 Pain in right leg: Secondary | ICD-10-CM | POA: Diagnosis not present

## 2017-03-10 LAB — POC URINALSYSI DIPSTICK (AUTOMATED)
Bilirubin, UA: NEGATIVE
GLUCOSE UA: NEGATIVE
Leukocytes, UA: NEGATIVE
NITRITE UA: NEGATIVE
Protein, UA: NEGATIVE
RBC UA: NEGATIVE
UROBILINOGEN UA: 0.2 U/dL
pH, UA: 6 (ref 5.0–8.0)

## 2017-03-10 LAB — CBC WITH DIFFERENTIAL/PLATELET
BASOS ABS: 0 10*3/uL (ref 0.0–0.1)
BASOS PCT: 0.4 % (ref 0.0–3.0)
EOS ABS: 0.2 10*3/uL (ref 0.0–0.7)
Eosinophils Relative: 2.3 % (ref 0.0–5.0)
HEMATOCRIT: 41.9 % (ref 36.0–46.0)
HEMOGLOBIN: 14 g/dL (ref 12.0–15.0)
LYMPHS PCT: 23.5 % (ref 12.0–46.0)
Lymphs Abs: 1.7 10*3/uL (ref 0.7–4.0)
MCHC: 33.4 g/dL (ref 30.0–36.0)
MCV: 89 fl (ref 78.0–100.0)
MONO ABS: 0.4 10*3/uL (ref 0.1–1.0)
Monocytes Relative: 6.1 % (ref 3.0–12.0)
Neutro Abs: 5 10*3/uL (ref 1.4–7.7)
Neutrophils Relative %: 67.7 % (ref 43.0–77.0)
Platelets: 242 10*3/uL (ref 150.0–400.0)
RBC: 4.71 Mil/uL (ref 3.87–5.11)
RDW: 13.7 % (ref 11.5–15.5)
WBC: 7.4 10*3/uL (ref 4.0–10.5)

## 2017-03-10 LAB — VITAMIN D 25 HYDROXY (VIT D DEFICIENCY, FRACTURES): VITD: 26.18 ng/mL — AB (ref 30.00–100.00)

## 2017-03-10 NOTE — Patient Instructions (Addendum)
Please stop at the front desk to set up referral for sleep study.  Please stop at the lab to have labs drawn.

## 2017-03-10 NOTE — Telephone Encounter (Addendum)
Pt came in office to ck in for 11:00 AM appt with Dr Diona Browner. Robin at front desk said pt expressed increased dizziness while at ck in window. Pt sat in chair. I assisted pt to w/c and has had fatigue and sleeping more for 2 - 3 months. Dizziness on and off but today dizziness is worse.Blurred vision on 03/09/17 and today vision still not clear. BP on 03/09/17 at home was 169/108; 1 1/2 later reck BP 155/99 P 110. Today h/a with pain level 4 now. Continues with dizziness, whether sitting still or moving same level of dizziness and tingling in back of head. Pt taking hyzaar 100-25 mg one daily and pt has not missed taking med. Now TP 97.5- P 78 pulse ox 96% and BP 146/90 lge cuff.

## 2017-03-10 NOTE — Progress Notes (Signed)
Subjective:    Patient ID: Jaime Chapman, female    DOB: [birth year], 57 y.o.   MRN: [id number]  HPI    57 year old female with major depression, vit d def, hypothyroidism presents with extreme fatigue.   She reports she has been fatiged in last year.. It is significantly worse in last 2-3 month. Has gotten so severe that she cannot function.  She is sleeping to 1-2 PM each day. She is sleeping 12-13 hours a day. She wakes up frequently at night.. Feels like choking or air cut off at night. She snores at night. Wakes husband up stopping breathing. She had a sleep apnea eval 10 years ago.  On way here she felt pulsation in ears.. Was at front desk and felt very dizzy, presyncopal. Felt like tunnel vision.  She is on multiple  possibly sedating meds: clonazepam, lexapro, singulair Dr. Lenna Gilford prescribed the clonazepam for ? Anxiety causing SOB  .[name] Has not helped with the shortness of breath.    She has been working on low carb diet.  BP has been running high lately.   She has muscle weakness and pain  In bilateral legs... Present for years worse in last year.  No longer seeing Dr. Estanislado Pandy for fibromyalgia  Numbness and tingling in arms, hands and feet.   She is seeing counseler for anxiety.   Counslor felt that SOB due to PTSD anxiety. She feels that depression with well controlled with lexapro.   Wt Readings from Last 3 Encounters:  08/31/16 225 lb 6 oz (102.2 kg)  05/07/16 229 lb 4 oz (104 kg)  04/09/16 229 lb 8 oz (104.1 kg)        Review of Systems  Constitutional: Negative for fatigue and fever.       Episode of incontinence the other day  HENT: Negative for congestion.   Eyes: Negative for pain.  Respiratory: Positive for shortness of breath. Negative for cough.   Cardiovascular: Negative for chest pain, palpitations and leg swelling.  Gastrointestinal: Negative for abdominal pain.  Genitourinary: Negative for dysuria and vaginal bleeding.  Musculoskeletal:  Negative for back pain.  Neurological: Negative for syncope, light-headedness and headaches.  Psychiatric/Behavioral: Negative for dysphoric mood.       Objective:   Physical Exam  Constitutional: Vital signs are normal. She appears well-developed and well-nourished. She is cooperative.  Non-toxic appearance. She does not appear ill. No distress.  HENT:  Head: Normocephalic.  Right Ear: Hearing, tympanic membrane, external ear and ear canal normal.  Left Ear: Hearing, tympanic membrane, external ear and ear canal normal.  Nose: Nose normal.  Eyes: Conjunctivae, EOM and lids are normal. Pupils are equal, round, and reactive to light. Lids are everted and swept, no foreign bodies found.  Neck: Trachea normal and normal range of motion. Neck supple. Carotid bruit is not present. No thyroid mass and no thyromegaly present.  Cardiovascular: Normal rate, regular rhythm, S1 normal, S2 normal, normal heart sounds and intact distal pulses. Exam reveals no gallop.  No murmur heard. Pulmonary/Chest: Effort normal and breath sounds normal. No respiratory distress. She has no wheezes. She has no rhonchi. She has no rales.  Abdominal: Soft. Normal appearance and bowel sounds are normal. She exhibits no distension, no fluid wave, no abdominal bruit and no mass. There is no hepatosplenomegaly. There is no tenderness. There is no rebound, no guarding and no CVA tenderness. No hernia.  Lymphadenopathy:    She has no cervical adenopathy.  She has no axillary adenopathy.  Neurological: She is alert. She has normal strength. No cranial nerve deficit or sensory deficit.  Skin: Skin is warm, dry and intact. No rash noted.  Psychiatric: Her speech is normal and behavior is normal. Judgment normal. Her mood appears not anxious. Cognition and memory are normal. She does not exhibit a depressed mood.          Assessment & Plan:

## 2017-03-14 ENCOUNTER — Ambulatory Visit: Payer: BLUE CROSS/BLUE SHIELD | Admitting: Psychology

## 2017-03-14 DIAGNOSIS — F411 Generalized anxiety disorder: Secondary | ICD-10-CM

## 2017-03-28 ENCOUNTER — Ambulatory Visit: Payer: BLUE CROSS/BLUE SHIELD | Admitting: Psychology

## 2017-03-28 DIAGNOSIS — F411 Generalized anxiety disorder: Secondary | ICD-10-CM | POA: Diagnosis not present

## 2017-04-04 DIAGNOSIS — E039 Hypothyroidism, unspecified: Secondary | ICD-10-CM | POA: Diagnosis not present

## 2017-04-04 DIAGNOSIS — G4700 Insomnia, unspecified: Secondary | ICD-10-CM | POA: Diagnosis not present

## 2017-04-04 DIAGNOSIS — E559 Vitamin D deficiency, unspecified: Secondary | ICD-10-CM | POA: Diagnosis not present

## 2017-04-04 DIAGNOSIS — N951 Menopausal and female climacteric states: Secondary | ICD-10-CM | POA: Diagnosis not present

## 2017-04-04 DIAGNOSIS — A6920 Lyme disease, unspecified: Secondary | ICD-10-CM | POA: Diagnosis not present

## 2017-04-05 DIAGNOSIS — R0683 Snoring: Secondary | ICD-10-CM | POA: Insufficient documentation

## 2017-04-05 DIAGNOSIS — R5383 Other fatigue: Secondary | ICD-10-CM | POA: Insufficient documentation

## 2017-04-05 NOTE — Assessment & Plan Note (Signed)
Refer for sleep apnea eval.. likely contributing to fatigue.

## 2017-04-05 NOTE — Assessment & Plan Note (Addendum)
Likely contributing to fatigue. Continue with counselor and lexapro.  Recomend weaning off clonazepam as likely contributing to oversedation.

## 2017-04-05 NOTE — Assessment & Plan Note (Signed)
Followed by Dr. Kathi Ludwig. Encouraged exercise, weight loss, healthy eating habits.

## 2017-04-05 NOTE — Assessment & Plan Note (Signed)
Likely multifactorial. Eval with labs. EKG: Normal sinus rhythm. Normal axis, normal R wave progression, No acute ST elevation or depression.

## 2017-04-05 NOTE — Assessment & Plan Note (Signed)
Good control on lexapro per pt.

## 2017-04-11 ENCOUNTER — Ambulatory Visit: Payer: BLUE CROSS/BLUE SHIELD | Admitting: Psychology

## 2017-04-11 DIAGNOSIS — F411 Generalized anxiety disorder: Secondary | ICD-10-CM

## 2017-04-19 ENCOUNTER — Encounter: Payer: Self-pay | Admitting: Pulmonary Disease

## 2017-04-19 ENCOUNTER — Ambulatory Visit (INDEPENDENT_AMBULATORY_CARE_PROVIDER_SITE_OTHER): Payer: BLUE CROSS/BLUE SHIELD | Admitting: Pulmonary Disease

## 2017-04-19 VITALS — BP 120/90 | HR 74 | Ht 64.0 in | Wt 230.2 lb

## 2017-04-19 DIAGNOSIS — R29818 Other symptoms and signs involving the nervous system: Secondary | ICD-10-CM | POA: Diagnosis not present

## 2017-04-19 NOTE — Progress Notes (Signed)
   Subjective:    Patient ID: Jaime Chapman, female    DOB: [birth year], 57 y.o.   MRN: [id number]  HPI    Review of Systems  Constitutional: Negative for fever and unexpected weight change.  HENT: Positive for trouble swallowing. Negative for congestion, dental problem, ear pain, nosebleeds, postnasal drip, rhinorrhea, sinus pressure, sneezing and sore throat.   Eyes: Negative for redness and itching.  Respiratory: Positive for choking and shortness of breath. Negative for cough, chest tightness and wheezing.   Cardiovascular: Negative for palpitations and leg swelling.  Gastrointestinal: Negative for nausea and vomiting.  Genitourinary: Negative for dysuria.  Musculoskeletal: Positive for joint swelling.  Skin: Positive for rash.  Allergic/Immunologic: Positive for environmental allergies. Negative for food allergies and immunocompromised state.  Neurological: Negative for headaches.  Hematological: Does not bruise/bleed easily.  Psychiatric/Behavioral: Negative for dysphoric mood. The patient is nervous/anxious.        Objective:   Physical Exam        Assessment & Plan:

## 2017-04-19 NOTE — Progress Notes (Signed)
[facility]  Chief Complaint  Patient presents with  . sleep consult    pt referred by Dr. Eliezer Lofts MD. Pt falls alseep quickly throughtout the day, pt gets up 3-4 times each night, snores loudly, and wakes up in mornings SOB.    Vital signs: BP 120/90 (BP Location: Left Arm, Cuff Size: Normal)   Pulse 74   Ht 5\' 4"  (1.626 m)   Wt 230 lb 3.2 oz (104.4 kg)   SpO2 96%   BMI 39.51 kg/m   History of Present Illness: Jaime Chapman is a 57 y.o. female for evaluation of sleep problems.  She was seen recently by her PCP.  She was noted to have snoring and fatigue.  She feels like she wakes up choking.  Her husband says she stops breathing at night.  She has to nap during the day for about 1 to 2 hours.  She had a sleep study in 2011.  Her weight at the time was 220 lbs.  She has also gone through menopause since her previous sleep study.  She was being followed by Dr. Estanislado Pandy for fibromyalgia.  She was diagnosed with chronic lyme disease in 2015 and is being treated for this by Dr. Kerney Elbe in [facility].  She was also told that she has elevated Epstein Barr virus levels.  She goes to sleep at 10 pm.  She falls asleep after 30 minutes.  She wakes up 2 or 3 times to use the bathroom.  She gets out of bed at 615 am to get her grandchildren to school.  She feels like she hasn't slept at all in the morning.  She will go back to sleep after dropping her grandchildren at school.  She denies morning headache.  She does not use anything to help her fall sleep.  She drinks a cup of coffee in the morning.  She can fall asleep whenever she is sitting quietly.  She denies sleep walking, sleep talking, bruxism, or nightmares.  There is no history of restless legs.  She denies sleep hallucinations, sleep paralysis, or cataplexy.  The Epworth score is 15 out of 24.  She has been followed by Dr. Lenna Gilford for asthma.  She feels this is well controlled.   She is not having cough, wheeze, sputum, or chest congestion.  Physical Exam:  General - pleasant Eyes - pupils reactive ENT - no sinus tenderness, no oral exudate, no LAN Cardiac - regular, no murmur Chest - no wheeze, rales Abd - soft, non tender Ext - no edema Skin - no rashes Neuro - normal strength Psych - normal mood  Discussion: She has snoring, sleep disruption, witnessed apnea, and daytime sleepiness.  She has a history of depression and anxiety.  I am concerned she could have sleep apnea.  We discussed how sleep apnea can affect various health problems, including risks for hypertension, cardiovascular disease, and diabetes.  We also discussed how sleep disruption can increase risks for accidents, such as while driving.  Weight loss as a means of improving sleep apnea was also reviewed.  Additional treatment options discussed were CPAP therapy, oral appliance, and surgical intervention.  Assessment/Plan:  Obstructive sleep apnea. - will arrange for home sleep study  Chronic fatigue. - she is being followed by Dr. Kerney Elbe in [facility] for chronic lyme disease - she is no longer following rheumatology   Patient Instructions  Will arrange for home sleep study Will call to arrange  for follow up after sleep study reviewed    Chesley Mires, MD [place] 04/19/2017, 10:06 AM Pager:  [phone]  Flow Sheet  Sleep tests: PSG 06/26/09 >> AHI 3, SpO2 low 90%.  Cardiac tests: Echo 05/28/09 >> EF 60 to 65%, grade 2 DD  Review of Systems: Constitutional: Negative for fever and unexpected weight change.  HENT: Positive for trouble swallowing. Negative for congestion, dental problem, ear pain, nosebleeds, postnasal drip, rhinorrhea, sinus pressure, sneezing and sore throat.   Eyes: Negative for redness and itching.  Respiratory: Positive for choking and shortness of breath. Negative for cough, chest tightness and wheezing.     Cardiovascular: Negative for palpitations and leg swelling.  Gastrointestinal: Negative for nausea and vomiting.  Genitourinary: Negative for dysuria.  Musculoskeletal: Positive for joint swelling.  Skin: Positive for rash.  Allergic/Immunologic: Positive for environmental allergies. Negative for food allergies and immunocompromised state.  Neurological: Negative for headaches.  Hematological: Does not bruise/bleed easily.  Psychiatric/Behavioral: Negative for dysphoric mood. The patient is nervous/anxious.     Past Medical History: She  has a past medical history of Allergic rhinitis, Anxiety, Asthma, DDD (degenerative disc disease), Depression, Diabetes mellitus, Dyslipidemia, Fibromyalgia, GERD (gastroesophageal reflux disease), Lung nodules, Lyme disease, Obesity, Sinusitis, Stress disorder, acute, and Vitamin D deficiency.  Past Surgical History: She  has a past surgical history that includes Ventral hernia repair (09/2006); laparoscopy (2002); Endometrial ablation (2009); Laparoscopic ventral hernia repair w/ mesh (08/2009); Cesarean section (1982, 1985); Tubal ligation; Ventral hernia repair (N/A, 04/14/2012); and Hernia repair (04/14/12).  Family History: Her family history includes Arthritis in her mother; Cancer in her maternal grandmother; Diabetes in her mother; Heart disease (age of onset: 54) in her mother; Lung cancer in her maternal grandmother.  Social History: She  reports that she quit smoking about 13 years ago. she has never used smokeless tobacco. She reports that she does not drink alcohol or use drugs.  Medications: Allergies as of 04/19/2017      Reactions   Progesterone    Stroke-like symptoms   Codeine    REACTION: nausea,vomiting,dizziness   Cymbalta [duloxetine Hcl]    Pt states it caused weight gain      Medication List        Accurate as of 04/19/17 10:06 AM. Always use your most recent med list.          albuterol 108 (90 Base) MCG/ACT  inhaler Commonly known as:  VENTOLIN HFA INHALE 2 PUFFS INTO THE LUNGS EVERY 4 HOURS AS NEEDED FOR WHEEIZNG   clonazePAM 0.5 MG tablet Commonly known as:  KLONOPIN Take 1/2 to 1 tablet by mouth two times daily   escitalopram 20 MG tablet Commonly known as:  LEXAPRO Take 1 tablet (20 mg total) by mouth daily.   estradiol 0.1 MG/24HR patch Commonly known as:  VIVELLE-DOT Place 1 patch onto the skin 2 (two) times a week.   Fluticasone-Salmeterol 100-50 MCG/DOSE Aepb Commonly known as:  ADVAIR DISKUS Inhale 1 puff into the lungs 2 (two) times daily.   losartan-hydrochlorothiazide 100-25 MG tablet Commonly known as:  HYZAAR TAKE 1 TABLET EVERY DAY   MAGNESIUM PO Take 1 tablet by mouth daily.   montelukast 10 MG tablet Commonly known as:  SINGULAIR Take 1 tablet (10 mg total) by mouth at bedtime.   NALTREXONE HCL PO Take 4.5 mg by mouth at bedtime.   progesterone 100 MG capsule Commonly known as:  PROMETRIUM Take 1 tablet by mouth daily x 3 weeks then  on stop on the fourth week.  repeat   thyroid 65 MG tablet Commonly known as:  ARMOUR Take 97.5 mg by mouth daily.

## 2017-04-19 NOTE — Patient Instructions (Signed)
Will arrange for home sleep study Will call to arrange for follow up after sleep study reviewed  

## 2017-04-21 ENCOUNTER — Other Ambulatory Visit: Payer: Self-pay | Admitting: Family Medicine

## 2017-04-25 ENCOUNTER — Telehealth: Payer: Self-pay | Admitting: Pulmonary Disease

## 2017-04-25 ENCOUNTER — Ambulatory Visit: Payer: BLUE CROSS/BLUE SHIELD | Admitting: Psychology

## 2017-04-25 DIAGNOSIS — F411 Generalized anxiety disorder: Secondary | ICD-10-CM

## 2017-04-25 NOTE — Telephone Encounter (Signed)
Checked with [facility], she did pre cert the study through the patients insurance. Called and let patient know and that the [facility] would be calling her to get everything scheduled. She is aware nothing further needed.

## 2017-05-04 ENCOUNTER — Emergency Department (HOSPITAL_COMMUNITY)
Admission: EM | Admit: 2017-05-04 | Discharge: 2017-05-04 | Disposition: A | Discharge status: Home / Self Care | Payer: BLUE CROSS/BLUE SHIELD | Attending: Emergency Medicine | Admitting: Emergency Medicine

## 2017-05-04 ENCOUNTER — Ambulatory Visit: Payer: Self-pay | Admitting: *Deleted

## 2017-05-04 ENCOUNTER — Other Ambulatory Visit: Payer: Self-pay

## 2017-05-04 ENCOUNTER — Encounter (HOSPITAL_COMMUNITY): Payer: Self-pay | Admitting: *Deleted

## 2017-05-04 DIAGNOSIS — I10 Essential (primary) hypertension: Secondary | ICD-10-CM | POA: Insufficient documentation

## 2017-05-04 DIAGNOSIS — E039 Hypothyroidism, unspecified: Secondary | ICD-10-CM | POA: Insufficient documentation

## 2017-05-04 DIAGNOSIS — J45909 Unspecified asthma, uncomplicated: Secondary | ICD-10-CM | POA: Insufficient documentation

## 2017-05-04 DIAGNOSIS — Z87891 Personal history of nicotine dependence: Secondary | ICD-10-CM | POA: Diagnosis not present

## 2017-05-04 DIAGNOSIS — Z79899 Other long term (current) drug therapy: Secondary | ICD-10-CM | POA: Insufficient documentation

## 2017-05-04 DIAGNOSIS — E119 Type 2 diabetes mellitus without complications: Secondary | ICD-10-CM | POA: Insufficient documentation

## 2017-05-04 DIAGNOSIS — K529 Noninfective gastroenteritis and colitis, unspecified: Secondary | ICD-10-CM | POA: Insufficient documentation

## 2017-05-04 DIAGNOSIS — R112 Nausea with vomiting, unspecified: Secondary | ICD-10-CM | POA: Diagnosis not present

## 2017-05-04 LAB — COMPREHENSIVE METABOLIC PANEL WITH GFR
ALT: 31 U/L (ref 14–54)
AST: 18 U/L (ref 15–41)
Albumin: 4.6 g/dL (ref 3.5–5.0)
Alkaline Phosphatase: 52 U/L (ref 38–126)
Anion gap: 14 (ref 5–15)
BUN: 19 mg/dL (ref 6–20)
CO2: 24 mmol/L (ref 22–32)
Calcium: 9.7 mg/dL (ref 8.9–10.3)
Chloride: 101 mmol/L (ref 101–111)
Creatinine, Ser: 0.99 mg/dL (ref 0.44–1.00)
GFR calc Af Amer: >60 mL/min
GFR calc non Af Amer: >60 mL/min
Glucose, Bld: 188 mg/dL — ABNORMAL HIGH (ref 65–99)
Potassium: 3.6 mmol/L (ref 3.5–5.1)
Sodium: 139 mmol/L (ref 135–145)
Total Bilirubin: 1.1 mg/dL (ref 0.3–1.2)
Total Protein: 7.7 g/dL (ref 6.5–8.1)

## 2017-05-04 LAB — URINALYSIS, ROUTINE W REFLEX MICROSCOPIC
BILIRUBIN URINE: NEGATIVE
GLUCOSE, UA: NEGATIVE mg/dL
HGB URINE DIPSTICK: NEGATIVE
KETONES UR: 5 mg/dL — AB
Leukocytes, UA: NEGATIVE
NITRITE: NEGATIVE
PH: 5 (ref 5.0–8.0)
Protein, ur: 30 mg/dL — AB
Specific Gravity, Urine: 1.033 — ABNORMAL HIGH (ref 1.005–1.030)

## 2017-05-04 LAB — CBC
HEMATOCRIT: 46.8 % — AB (ref 36.0–46.0)
HEMOGLOBIN: 16 g/dL — AB (ref 12.0–15.0)
MCH: 30.2 pg (ref 26.0–34.0)
MCHC: 34.2 g/dL (ref 30.0–36.0)
MCV: 88.5 fL (ref 78.0–100.0)
Platelets: 237 10*3/uL (ref 150–400)
RBC: 5.29 MIL/uL — AB (ref 3.87–5.11)
RDW: 12.8 % (ref 11.5–15.5)
WBC: 10.1 10*3/uL (ref 4.0–10.5)

## 2017-05-04 LAB — LIPASE, BLOOD: Lipase: 26 U/L (ref 11–51)

## 2017-05-04 MED ORDER — ONDANSETRON 4 MG PO TBDP
8.0000 mg | ORAL_TABLET | Freq: Once | ORAL | Status: AC
  Administered 2017-05-04: 8 mg via ORAL
  Filled 2017-05-04: qty 2

## 2017-05-04 MED ORDER — ONDANSETRON HCL 4 MG PO TABS: 4.0000 mg | ORAL_TABLET | Freq: Four times a day (QID) | ORAL | 0 refills | Status: DC

## 2017-05-04 MED ORDER — SODIUM CHLORIDE 0.9 % IV BOLUS (SEPSIS)
1000.0000 mL | Freq: Once | INTRAVENOUS | Status: AC
  Administered 2017-05-04: 1000 mL via INTRAVENOUS

## 2017-05-04 NOTE — ED Provider Notes (Signed)
[facility] EMERGENCY DEPARTMENT Provider Note   CSN: [id number] Arrival date & time: 05/04/17  1553     History   Chief Complaint Chief Complaint  Patient presents with  . Emesis    HPI Jaime Chapman is a 57 y.o. female.  HPI   57 year old female presents today with complaints of nausea vomiting diarrhea.  Patient notes that last night after eating canned salmon with her husband she developed some GI upset.  She notes she vomited once last night.  She woke up this morning with nausea vomiting and diarrhea.  She denies any associated abdominal pain, fever, bloody vomit or bloody diarrhea.  She reports she has been unable to tolerate any significant fluid or food prior to arrival.  Patient denies any history of the same.  She denies any recent illnesses, reports that her husband is experiencing diarrhea as well.  Patient does note a history of chronic Lyme disease not currently on doxycycline.    Past Medical History:  Diagnosis Date  . Allergic rhinitis   . Anxiety   . Asthma   . DDD (degenerative disc disease)    also has spinal arthritis  . Depression   . Diabetes mellitus    borderline  . Dyslipidemia   . Fibromyalgia   . GERD (gastroesophageal reflux disease)   . Lung nodules    RIGHT LUNG--LAST EVALUATED BY CHEST CT 02/01/12 - STABLE AND FOLLOW UP PLANNED IN ONE YEAR.  [name] Lyme disease   . Obesity   . Sinusitis   . Stress disorder, acute   . Vitamin D deficiency     Patient Active Problem List   Diagnosis Date Noted  . Fatigue 04/05/2017  . Snoring 04/05/2017  . Pre-syncope 03/10/2017  . Bilateral leg pain 03/10/2017  . Abnormal CT of the chest 08/31/2016  . Panic attacks 04/09/2016  . Major depression, recurrent ([place]) 05/08/2015  . Asthma, chronic obstructive, with acute exacerbation ([place]) 01/28/2015  . Essential hypertension 07/26/2014  . Dizziness 07/26/2014  . Constipation - functional 11/20/2013  . Hypothyroidism 11/16/2013  .  H/O ventral hernia repair 05/01/2012  . Pulmonary nodules 01/26/2012  . Overweight(278.02) 01/26/2012  . Impaired fasting glucose 03/04/2011  . Fatty liver disease, nonalcoholic 35/36/1443  . Generalized anxiety disorder 10/21/2010  . VENTRAL HERNIA-twice recurrent 09/08/2009  . Vitamin D deficiency 12/27/2008  . Hyperlipidemia 05/22/2007  . Moderate persistent asthma 05/22/2007  . DEGENERATIVE DISC DISEASE 05/22/2007  . Fibromyalgia 05/22/2007  . Allergic rhinitis 04/14/2007  . GERD 04/14/2007    Past Surgical History:  Procedure Laterality Date  . [place]   x 2   . ENDOMETRIAL ABLATION  2009  . HERNIA REPAIR  04/14/12   ventral hernia repair  . Laparoscopic ventral hernia repair w/ mesh  08/2009   Dr. Grandville Silos, [place]  . LAPAROSCOPY  2002  . TUBAL LIGATION    . VENTRAL HERNIA REPAIR  09/2006   Dr. Grandville Silos  . VENTRAL HERNIA REPAIR N/A 04/14/2012   Procedure: Laparoscopic Repair of  Ventral Hernia;  Surgeon: Pedro Earls, MD;  Location: [facility];  Service: General;  Laterality: N/A;  Repair of Recurrent Ventral Hernia    OB History    No data available       Home Medications    Prior to Admission medications   Medication Sig Start Date End Date Taking? Authorizing Provider  albuterol (VENTOLIN HFA) 108 (90 Base) MCG/ACT inhaler INHALE 2 PUFFS INTO THE LUNGS EVERY  4 HOURS AS NEEDED FOR [facility] 08/31/16   Noralee Space, MD  clonazePAM (KLONOPIN) 0.5 MG tablet Take 1/2 to 1 tablet by mouth two times daily 08/31/16   Noralee Space, MD  escitalopram (LEXAPRO) 20 MG tablet TAKE 1 TABLET (20 MG TOTAL) BY MOUTH DAILY. 04/21/17   Bedsole, [name] E, MD  estradiol (VIVELLE-DOT) 0.1 MG/24HR patch Place 1 patch onto the skin 2 (two) times a week. 07/07/15   [provider]  Fluticasone-Salmeterol (ADVAIR DISKUS) 100-50 MCG/DOSE AEPB Inhale 1 puff into the lungs 2 (two) times daily. 08/31/16   Noralee Space, MD  losartan-hydrochlorothiazide (HYZAAR) 100-25 MG  tablet TAKE 1 TABLET BY MOUTH EVERY DAY 04/21/17   Bedsole, [name] E, MD  MAGNESIUM PO Take 1 tablet by mouth daily.     [provider]  montelukast (SINGULAIR) 10 MG tablet Take 1 tablet (10 mg total) by mouth at bedtime. 08/31/16   Noralee Space, MD  NALTREXONE HCL PO Take 4.5 mg by mouth at bedtime.    [provider]  ondansetron (ZOFRAN) 4 MG tablet Take 1 tablet (4 mg total) by mouth every 6 (six) hours. 05/04/17   Onyx Schirmer, Dellis Filbert, PA-C  progesterone (PROMETRIUM) 100 MG capsule Take 1 tablet by mouth daily x 3 weeks then on stop on the fourth week.  repeat    [provider]  thyroid (ARMOUR) 65 MG tablet Take 97.5 mg by mouth daily.    [provider]    Family History Family History  Problem Relation Age of Onset  . Diabetes Mother   . Heart disease Mother 83  . Arthritis Mother   . Lung cancer Maternal Grandmother   . Cancer Maternal Grandmother        lung  . Colon cancer Neg Hx     Social History Social History   Tobacco Use  . Smoking status: Former Smoker    Last attempt to quit: 03/01/2004    Years since quitting: 13.1  . Smokeless tobacco: Never Used  Substance Use Topics  . Alcohol use: No    Alcohol/week: 0.6 oz    Types: 1 Standard drinks or equivalent per week  . Drug use: No    Comment: remote [place]     Allergies   Progesterone; Codeine; and Cymbalta [duloxetine hcl]   Review of Systems Review of Systems  All other systems reviewed and are negative.  Physical Exam Updated Vital Signs BP 136/74   Pulse 82   Temp 99 F (37.2 C) (Oral)   Resp (!) 22   Ht 5\' 4"  (1.626 m)   Wt 100.2 kg (221 lb)   SpO2 100%   BMI 37.93 kg/m   Physical Exam  Constitutional: She is oriented to person, place, and time. She appears well-developed and well-nourished.  HENT:  Head: Normocephalic and atraumatic.  Eyes: Conjunctivae are normal. Pupils are equal, round, and reactive to light. Right eye exhibits no discharge. Left eye  exhibits no discharge. No scleral icterus.  Neck: Normal range of motion. No JVD present. No tracheal deviation present.  Cardiovascular: Normal rate, regular rhythm and normal heart sounds.  Pulmonary/Chest: Effort normal and breath sounds normal. No stridor. No respiratory distress. She has no wheezes. She has no rales. She exhibits no tenderness.  Abdominal: Soft. Bowel sounds are normal. She exhibits no distension and no mass. There is no tenderness. There is no rebound and no guarding. No hernia.  Musculoskeletal: She exhibits no edema.  Neurological: She is  alert and oriented to person, place, and time. Coordination normal.  Psychiatric: She has a normal mood and affect. Her behavior is normal. Judgment and thought content normal.  Nursing note and vitals reviewed.   ED Treatments / Results  Labs (all labs ordered are listed, but only abnormal results are displayed) Labs Reviewed  COMPREHENSIVE METABOLIC PANEL - Abnormal; Notable for the following components:      Result Value   Glucose, Bld 188 (*)    All other components within normal limits  CBC - Abnormal; Notable for the following components:   RBC 5.29 (*)    Hemoglobin 16.0 (*)    HCT 46.8 (*)    All other components within normal limits  URINALYSIS, ROUTINE W REFLEX MICROSCOPIC - Abnormal; Notable for the following components:   Color, Urine AMBER (*)    APPearance HAZY (*)    Specific Gravity, Urine 1.033 (*)    Ketones, ur 5 (*)    Protein, ur 30 (*)    Bacteria, UA RARE (*)    Squamous Epithelial / LPF 0-5 (*)    All other components within normal limits  LIPASE, BLOOD    EKG  EKG Interpretation None       Radiology No results found.  Procedures Procedures (including critical care time)  Medications Ordered in ED Medications  ondansetron (ZOFRAN-ODT) disintegrating tablet 8 mg (8 mg Oral Given 05/04/17 1725)  sodium chloride 0.9 % bolus 1,000 mL (0 mLs Intravenous Stopped 05/04/17 2145)     Initial  Impression / Assessment and Plan / ED Course  I have reviewed the triage vital signs and the nursing notes.  Pertinent labs & imaging results that were available during my care of the patient were reviewed by me and considered in my medical decision making (see chart for details).     Final Clinical Impressions(s) / ED Diagnoses   Final diagnoses:  Gastroenteritis    Labs: Urinalysis, lipase, CMP, CBC  Imaging:  Consults:  Therapeutics: Normal saline, Zofran  Discharge Meds:   Assessment/Plan: 57 year old female presents today with likely viral gastroenteritis.  Lower suspicion for bacterial source given her well appearance afebrile with reassuring laboratory analysis.  Patient does appear to be dehydrated, she will be given a liter of fluid here along with Zofran.  The patient was given a liter of fluid, Zofran with symptomatic improvement.  She is tolerating p.o. without difficulty.  I feel she is safe for outpatient follow-up with strict return precautions.  Patient also mentions that she has had chronic history of shortness of breath, she has been assessed by pulmonology, primary care and is requesting a cardiac evaluation as an outpatient.  Patient denies any acute shortness of breath now, she can lay flat without any orthopnea, denies any lower extremity swelling or edema presently.  She does not have any signs of acute heart failure, but will be referred to cardiology for further evaluation and management.  Patient verbalized understanding and agreement to this plan and had no further questions or concerns at the time of discharge.    ED Discharge Orders        Ordered    ondansetron (ZOFRAN) 4 MG tablet  Every 6 hours     05/04/17 2148       Okey Regal, Hershal Coria 05/04/17 2211    Dorie Rank, MD 05/06/17 1059

## 2017-05-04 NOTE — ED Notes (Signed)
The pt is feeling better after she had the zofran she received in triage  Her vomiting has slowed down

## 2017-05-04 NOTE — Discharge Instructions (Signed)
Please read attached information. If you experience any new or worsening signs or symptoms please return to the emergency room for evaluation. Please follow-up with your primary care provider or specialist as discussed. Please use medication prescribed only as directed and discontinue taking if you have any concerning signs or symptoms.   °

## 2017-05-04 NOTE — Telephone Encounter (Signed)
Agree with ER visit. 

## 2017-05-04 NOTE — ED Triage Notes (Signed)
The pt is c/o vomiting and diarrhea since this am  No abd pain  lmp none

## 2017-05-04 NOTE — Telephone Encounter (Signed)
Pt having diarrhea, nausea and vomiting with abd pain. She states she has vomited 11 times in the past 24 hours. She is unable to keep anything down. She now has a headache from vomiting.  She states every time she vomits "feels like an electric shock". Per protocol, pt is advised to go to the emergency department at [facility]. She stated that she would and her husband would be taking her.   Reason for Disposition . [1] SEVERE vomiting (e.g., 6 or more times/day) AND [2] present > 8 hours  Answer Assessment - Initial Assessment Questions 1. VOMITING SEVERITY: "How many times have you vomited in the past 24 hours?"     - MILD:  1 - 2 times/day    - MODERATE: 3 - 5 times/day, decreased oral intake without significant weight loss or symptoms of dehydration    - SEVERE: 6 or more times/day, vomits everything or nearly everything, with significant weight loss, symptoms of dehydration      11 times - severe 2. ONSET: "When did the vomiting begin?"      Started this morning 3. FLUIDS: "What fluids or food have you vomited up today?" "Have you been able to keep any fluids down?"     Took some Gatorade and water, few sips 4. ABDOMINAL PAIN: "Are your having any abdominal pain?" If yes : "How bad is it and what does it feel like?" (e.g., crampy, dull, intermittent, constant)      Yes when she vomits "feels like an electrical shock" 5. DIARRHEA: "Is there any diarrhea?" If so, ask: "How many times today?"      Yes, diarrhea 9 times 6. CONTACTS: "Is there anyone else in the family with the same symptoms?"      no 7. CAUSE: "What do you think is causing your vomiting?"     Not sure (stomach virus in the school going around) 8. HYDRATION STATUS: "Any signs of dehydration?" (e.g., dry mouth [not only dry lips], too weak to stand) "When did you last urinate?"     Yes,dry mouth, dry lips, can stand but weak, last urination, comes out when she vomits 9. OTHER SYMPTOMS: "Do you have any other symptoms?" (e.g.,  fever, headache, vertigo, vomiting blood or coffee grounds, recent head injury)     Headache 10. PREGNANCY: "Is there any chance you are pregnant?" "When was your last menstrual period?"       No periods  Protocols used: [facility]

## 2017-05-05 DIAGNOSIS — G4733 Obstructive sleep apnea (adult) (pediatric): Secondary | ICD-10-CM | POA: Diagnosis not present

## 2017-05-06 ENCOUNTER — Other Ambulatory Visit: Payer: Self-pay | Admitting: *Deleted

## 2017-05-06 DIAGNOSIS — R29818 Other symptoms and signs involving the nervous system: Secondary | ICD-10-CM

## 2017-05-09 ENCOUNTER — Ambulatory Visit: Payer: BLUE CROSS/BLUE SHIELD | Admitting: Psychology

## 2017-05-09 DIAGNOSIS — F411 Generalized anxiety disorder: Secondary | ICD-10-CM | POA: Diagnosis not present

## 2017-05-10 ENCOUNTER — Encounter: Payer: Self-pay | Admitting: Pulmonary Disease

## 2017-05-10 ENCOUNTER — Telehealth: Payer: Self-pay | Admitting: Pulmonary Disease

## 2017-05-10 DIAGNOSIS — G4733 Obstructive sleep apnea (adult) (pediatric): Secondary | ICD-10-CM

## 2017-05-10 DIAGNOSIS — Z8669 Personal history of other diseases of the nervous system and sense organs: Secondary | ICD-10-CM | POA: Insufficient documentation

## 2017-05-10 HISTORY — DX: Obstructive sleep apnea (adult) (pediatric): G47.33

## 2017-05-10 NOTE — Telephone Encounter (Signed)
HST 05/05/17 >> AHI 11.7, SaO2 low 80%   Will have my nurse inform pt that sleep study shows mild sleep apnea.  Options are 1) CPAP now, 2) ROV first.  If She is agreeable to CPAP, then please send order for auto CPAP range 5 to 15 cm H2O with heated humidity and mask of choice.  Have download sent 1 month after starting CPAP and set up ROV 2 months after starting CPAP.  ROV can be with me or NP.

## 2017-05-11 NOTE — Telephone Encounter (Signed)
Called and spoke with patient regarding results Pt verbalized understanding of results and recommendations of Vs Pt is not wanting to start on cpap machine at this time Pt would like to know if oral appliance could be an option  VS please advise if this could be an option for the patient

## 2017-05-11 NOTE — Telephone Encounter (Signed)
Okay to try oral appliance.  She can check with her dentist about getting oral appliance made to treat obstructive sleep apnea.  If her dentist doesn't make these, then she can call back for a referral.  Please schedule her for ROV in 6 months to assess her sleep status after getting oral appliance fitted.

## 2017-05-12 NOTE — Telephone Encounter (Signed)
Called and spoke with patient regarding oral appliance or cpap machine Pt is requesting to start on cpap machine  Placed order for auto CPAP range 5 to 15 cm H2O with heated humidity and mask of choice.   Have download sent 1 month after starting CPAP and set up ROV 2 months after starting CPAP.   Nothing further needed at this time

## 2017-05-19 ENCOUNTER — Ambulatory Visit: Payer: BLUE CROSS/BLUE SHIELD | Admitting: Psychology

## 2017-05-19 DIAGNOSIS — F411 Generalized anxiety disorder: Secondary | ICD-10-CM | POA: Diagnosis not present

## 2017-05-23 ENCOUNTER — Ambulatory Visit: Payer: Self-pay | Admitting: Psychology

## 2017-05-24 DIAGNOSIS — G4733 Obstructive sleep apnea (adult) (pediatric): Secondary | ICD-10-CM | POA: Diagnosis not present

## 2017-06-01 ENCOUNTER — Telehealth: Payer: Self-pay | Admitting: Pulmonary Disease

## 2017-06-01 NOTE — Telephone Encounter (Signed)
Called and spoke with patient regarding f/u with new cpap machine rec'd letter from [facility] needing appt with VS b/w 06/24/2017 - 08/22/2017 Scheduled appt with VS for 08/02/2017 at 9am Nothing further needed at this time

## 2017-06-06 ENCOUNTER — Ambulatory Visit (INDEPENDENT_AMBULATORY_CARE_PROVIDER_SITE_OTHER): Payer: BLUE CROSS/BLUE SHIELD | Admitting: Psychology

## 2017-06-06 DIAGNOSIS — F411 Generalized anxiety disorder: Secondary | ICD-10-CM

## 2017-06-07 ENCOUNTER — Other Ambulatory Visit: Payer: Self-pay | Admitting: Pulmonary Disease

## 2017-06-20 ENCOUNTER — Ambulatory Visit (INDEPENDENT_AMBULATORY_CARE_PROVIDER_SITE_OTHER): Payer: BLUE CROSS/BLUE SHIELD | Admitting: Psychology

## 2017-06-20 DIAGNOSIS — F411 Generalized anxiety disorder: Secondary | ICD-10-CM | POA: Diagnosis not present

## 2017-06-24 DIAGNOSIS — G4733 Obstructive sleep apnea (adult) (pediatric): Secondary | ICD-10-CM | POA: Diagnosis not present

## 2017-07-04 ENCOUNTER — Ambulatory Visit (INDEPENDENT_AMBULATORY_CARE_PROVIDER_SITE_OTHER): Payer: BLUE CROSS/BLUE SHIELD | Admitting: Psychology

## 2017-07-04 DIAGNOSIS — F411 Generalized anxiety disorder: Secondary | ICD-10-CM

## 2017-07-18 ENCOUNTER — Ambulatory Visit (INDEPENDENT_AMBULATORY_CARE_PROVIDER_SITE_OTHER): Payer: BLUE CROSS/BLUE SHIELD | Admitting: Psychology

## 2017-07-18 DIAGNOSIS — F411 Generalized anxiety disorder: Secondary | ICD-10-CM

## 2017-07-24 DIAGNOSIS — G4733 Obstructive sleep apnea (adult) (pediatric): Secondary | ICD-10-CM | POA: Diagnosis not present

## 2017-08-01 ENCOUNTER — Ambulatory Visit (INDEPENDENT_AMBULATORY_CARE_PROVIDER_SITE_OTHER): Payer: BLUE CROSS/BLUE SHIELD | Admitting: Psychology

## 2017-08-01 DIAGNOSIS — F411 Generalized anxiety disorder: Secondary | ICD-10-CM

## 2017-08-02 ENCOUNTER — Encounter: Payer: Self-pay | Admitting: Pulmonary Disease

## 2017-08-02 ENCOUNTER — Ambulatory Visit (INDEPENDENT_AMBULATORY_CARE_PROVIDER_SITE_OTHER): Payer: BLUE CROSS/BLUE SHIELD | Admitting: Pulmonary Disease

## 2017-08-02 VITALS — BP 118/80 | HR 84 | Ht 64.0 in | Wt 230.2 lb

## 2017-08-02 DIAGNOSIS — G4733 Obstructive sleep apnea (adult) (pediatric): Secondary | ICD-10-CM

## 2017-08-02 NOTE — Progress Notes (Signed)
[place]  Chief Complaint  Patient presents with  . Follow-up    Pt is doing ok with the cpap machine, she is taking the mask off during her sleep.    Vital signs: BP 118/80 (BP Location: Left Arm, Cuff Size: Normal)   Pulse 84   Ht 5\' 4"  (1.626 m)   Wt 230 lb 3.2 oz (104.4 kg)   SpO2 96%   BMI 39.51 kg/m   History of Present Illness: Jaime Chapman is a 57 y.o. female with obstructive sleep apnea.  Since her last visit she had home sleep study that showed mild sleep apnea.  Started on CPAP.  She has nasal cup mask.  No issues with pressure or mask fit.  Not having sinus congestion or dry mouth.  Had a bronchitis few ago and wasn't able to use CPAP then.  Feels CPAP helps her sleep quality.   Physical Exam:  General - pleasant Eyes - pupils reactive ENT - no sinus tenderness, no oral exudate, no LAN, MP 4 Cardiac - regular, no murmur Chest - no wheeze, rales Abd - soft, non tender Ext - no edema Skin - no rashes Neuro - normal strength Psych - normal mood  Assessment/Plan:  Obstructive sleep apnea. - she is compliant with CPAP and reports benefit from therapy - continue auto CPAP  Chronic fatigue. - she is being followed by Dr. Kerney Elbe in [facility] for chronic lyme disease - she is no longer following rheumatology   Patient Instructions  Follow up in 1 year   Chesley Mires, MD [place] 08/02/2017, 9:14 AM Pager:  [phone]  Flow Sheet  Sleep tests: PSG 06/26/09 >> AHI 3, SpO2 low 90%. HST 05/05/17 >> AHI 11.7, SaO2 low 80% Auto CPAP 07/02/17 to 07/31/17 >> used on 25 of 30 nights with average 5 hrs 6 min.  Average AHI 3.2 with median CPAP 9 and 95 th percentile CPAP 11 cm H2O  Cardiac tests: Echo 05/28/09 >> EF 60 to 65%, grade 2 DD  Past Medical History: She  has a past medical history of Allergic rhinitis, Anxiety, Asthma, DDD (degenerative disc disease), Depression,  Diabetes mellitus, Dyslipidemia, Fibromyalgia, GERD (gastroesophageal reflux disease), Lung nodules, Lyme disease, Obesity, OSA (obstructive sleep apnea) (05/10/2017), Sinusitis, Stress disorder, acute, and Vitamin D deficiency.  Past Surgical History: She  has a past surgical history that includes Ventral hernia repair (09/2006); laparoscopy (2002); Endometrial ablation (2009); Laparoscopic ventral hernia repair w/ mesh (08/2009); Cesarean section (1982, 1985); Tubal ligation; Ventral hernia repair (N/A, 04/14/2012); and Hernia repair (04/14/12).  Family History: Her family history includes Arthritis in her mother; Cancer in her maternal grandmother; Diabetes in her mother; Heart disease (age of onset: 24) in her mother; Lung cancer in her maternal grandmother.  Social History: She  reports that she quit smoking about 13 years ago. She has never used smokeless tobacco. She reports that she does not drink alcohol or use drugs.  Medications: Allergies as of 08/02/2017      Reactions   Progesterone    Stroke-like symptoms   Codeine    REACTION: nausea,vomiting,dizziness   Cymbalta [duloxetine Hcl]    Pt states it caused weight gain      Medication List        Accurate as of 08/02/17  9:14 AM. Always use your most recent med list.          albuterol 108 (90 Base) MCG/ACT inhaler Commonly known as:  VENTOLIN HFA INHALE 2 PUFFS INTO THE LUNGS EVERY 4 HOURS AS NEEDED FOR WHEEIZNG   clonazePAM 0.5 MG tablet Commonly known as:  KLONOPIN TAKE 1/2-1 TABLET BY MOUTH 2 TIMES DAILY   escitalopram 20 MG tablet Commonly known as:  LEXAPRO TAKE 1 TABLET (20 MG TOTAL) BY MOUTH DAILY.   estradiol 0.1 MG/24HR patch Commonly known as:  VIVELLE-DOT Place 1 patch onto the skin 2 (two) times a week.   Fluticasone-Salmeterol 100-50 MCG/DOSE Aepb Commonly known as:  ADVAIR DISKUS Inhale 1 puff into the lungs 2 (two) times daily.   losartan-hydrochlorothiazide 100-25 MG tablet Commonly known as:   HYZAAR TAKE 1 TABLET BY MOUTH EVERY DAY   MAGNESIUM PO Take 1 tablet by mouth daily.   montelukast 10 MG tablet Commonly known as:  SINGULAIR Take 1 tablet (10 mg total) by mouth at bedtime.   NALTREXONE HCL PO Take 4.5 mg by mouth at bedtime.   progesterone 100 MG capsule Commonly known as:  PROMETRIUM Take 1 tablet by mouth daily x 3 weeks then on stop on the fourth week.  repeat   thyroid 65 MG tablet Commonly known as:  ARMOUR Take 97.5 mg by mouth daily.

## 2017-08-02 NOTE — Patient Instructions (Signed)
Follow up in 1 year.

## 2017-08-15 ENCOUNTER — Ambulatory Visit (INDEPENDENT_AMBULATORY_CARE_PROVIDER_SITE_OTHER): Payer: BLUE CROSS/BLUE SHIELD | Admitting: Psychology

## 2017-08-15 DIAGNOSIS — F411 Generalized anxiety disorder: Secondary | ICD-10-CM

## 2017-08-24 DIAGNOSIS — G4733 Obstructive sleep apnea (adult) (pediatric): Secondary | ICD-10-CM | POA: Diagnosis not present

## 2017-08-29 ENCOUNTER — Ambulatory Visit (INDEPENDENT_AMBULATORY_CARE_PROVIDER_SITE_OTHER): Payer: BLUE CROSS/BLUE SHIELD | Admitting: Psychology

## 2017-08-29 DIAGNOSIS — F411 Generalized anxiety disorder: Secondary | ICD-10-CM

## 2017-08-31 ENCOUNTER — Encounter: Payer: Self-pay | Admitting: Pulmonary Disease

## 2017-08-31 ENCOUNTER — Ambulatory Visit: Payer: BLUE CROSS/BLUE SHIELD | Admitting: Pulmonary Disease

## 2017-08-31 ENCOUNTER — Ambulatory Visit (INDEPENDENT_AMBULATORY_CARE_PROVIDER_SITE_OTHER)
Admission: RE | Admit: 2017-08-31 | Discharge: 2017-08-31 | Disposition: A | Discharge status: Home / Self Care | Payer: BLUE CROSS/BLUE SHIELD | Source: Ambulatory Visit | Attending: Pulmonary Disease | Admitting: Pulmonary Disease

## 2017-08-31 VITALS — BP 128/82 | HR 66 | Temp 97.7°F | Ht 64.0 in | Wt 227.2 lb

## 2017-08-31 DIAGNOSIS — G4733 Obstructive sleep apnea (adult) (pediatric): Secondary | ICD-10-CM

## 2017-08-31 DIAGNOSIS — J4540 Moderate persistent asthma, uncomplicated: Secondary | ICD-10-CM

## 2017-08-31 DIAGNOSIS — R9389 Abnormal findings on diagnostic imaging of other specified body structures: Secondary | ICD-10-CM

## 2017-08-31 DIAGNOSIS — J3089 Other allergic rhinitis: Secondary | ICD-10-CM | POA: Diagnosis not present

## 2017-08-31 DIAGNOSIS — J45909 Unspecified asthma, uncomplicated: Secondary | ICD-10-CM | POA: Diagnosis not present

## 2017-08-31 MED ORDER — FLUTICASONE-SALMETEROL 100-50 MCG/DOSE IN AEPB: 1.0000 | INHALATION_SPRAY | Freq: Two times a day (BID) | RESPIRATORY_TRACT | 3 refills | Status: DC

## 2017-08-31 MED ORDER — ALBUTEROL SULFATE HFA 108 (90 BASE) MCG/ACT IN AERS: INHALATION_SPRAY | RESPIRATORY_TRACT | 3 refills | Status: DC

## 2017-08-31 MED ORDER — MONTELUKAST SODIUM 10 MG PO TABS: 10.0000 mg | ORAL_TABLET | Freq: Every day | ORAL | 3 refills | Status: DC

## 2017-08-31 NOTE — Patient Instructions (Addendum)
Today we updated your med list in our EPIC system...    Continue your current medications the same...    We refilled your meds per request...  Today we checked a follow up CXR...    We will contact you w/ the results when available...   We also did a follow up Spirometry breathing test and an Ambulatory Oximetry test...    Your spirometry showed a mixed obstructive & restrictive pattern (rec to continue same meds & get weight down)...    Your ambulatory OXIMETRY showed that you did NOT desaturate w/ exercise (your O2sats remained in the 90s)...   Your are recommended to continue the ADVAIR twice daily...    Use the Albuterol rescue inhaler every 4-6H as needed for wheezing...       Continue the Singulair daily as well...  It is very important to get on track w/ DIET & EXERCISE...    Commit to losing 20 lbs!!!  Call for any questions...  DrSood will continue your pulmonary & sleep follow up next year.Marland KitchenMarland Kitchen

## 2017-08-31 NOTE — Progress Notes (Addendum)
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  + trigger points on exam... DERM:  No lesions noted; no rash etc...  RADIOLOGY DATA:  Reviewed in the EPIC EMR & discussed w/ the patient...  LABORATORY DATA:  Reviewed in the EPIC EMR & discussed w/ the patient...   Assessment & Plan:    ASTHMA/ Panic>  Stable on her combination of Advair, Proventil, Singulair; breathing improved overall on these meds and Ativan prn anxiety... 2 tiny right pulm nodules ==> no change on CT 12/13 and lesions not seen on CXR, therefore most likely benign & she is reassured, we will continue to monitor CXR yearly... 08/31/17>   Azaya is overall stable- she had one interval bronchitic infection treated w/ Ab's & no reported AB exac, responded back to baseline & stable on the Advair;  She has mild OSA but not adeq compliant w/ CPAP & we discussed this, encouraged f/u w/ DrSood;  Spirometry shows mixed restrictive (obesity) & obstructive dis- needs better diet/ exercise/ wt reduction;  We discussed my up-coming retirement & she will f/u w/ DrSood for [employer]   [facility] >>  DYSLIPIDEMIA>  Her FLP is improved on her diet w/ wt loss etc...  Elevated Fasting blood  sugar>  Similiarly her BS has improved to 88 on her diet program...  ?THYROID> on Nature Thyroid per [employer]...  ?Chronic Lyme Disease> treated by Moise Boring- [facility]  Obesity>  As above, diet + exercise are the keys!!! She prev lost 42# on her Optifast program...  GERD, Gastritis>  Off Prilosec now...  ABDOMINAL symptoms>  S/p surg for recurrent abd wall hernias & she should consider a consultation from [facility] in [place]... GERD treated w/ PPI prn now, and constip/ bloating improved on Miralax, Simethacone.  DJD, CSpine DDD, Chr Pain, Spinal arthritis>  Prev followed by DrDeveshwar on RELAFEN & AMRIX; now on Advil & Voltaren gel prn..  Vit D deficiency>  Pt states Vit D levels checked by DrDeveshwar who has her on OTC Vit d supplement daily...  ANXIETY/ Panic disorder>  She asked that we refill her Ativan 1mg - 1/2 to 1 tab Tid prn...   Patient's Medications  New Prescriptions   No medications on file  Previous Medications   CLONAZEPAM (KLONOPIN) 0.5 MG TABLET  TAKE 1/2-1 TABLET BY MOUTH 2 TIMES DAILY   ESCITALOPRAM (LEXAPRO) 20 MG TABLET    TAKE 1 TABLET (20 MG TOTAL) BY MOUTH DAILY.   ESTRADIOL (VIVELLE-DOT) 0.1 MG/24HR PATCH    Place 1 patch onto the skin 2 (two) times a week.   LOSARTAN-HYDROCHLOROTHIAZIDE (HYZAAR) 100-25 MG TABLET    TAKE 1 TABLET BY MOUTH EVERY DAY   MAGNESIUM PO    Take 1 tablet by mouth daily.    NALTREXONE HCL PO    Take 4.5 mg by mouth at bedtime.   PROGESTERONE (PROMETRIUM) 100 MG CAPSULE    Take 1 tablet by mouth daily x 3 weeks then on stop on the fourth week.  repeat   THYROID (ARMOUR) 65 MG TABLET    Take 97.5 mg by mouth daily.  Modified Medications   Modified Medication Previous Medication   ALBUTEROL (VENTOLIN HFA) 108 (90 BASE) MCG/ACT INHALER albuterol (VENTOLIN HFA) 108 (90 Base) MCG/ACT inhaler      INHALE 2 PUFFS INTO THE LUNGS EVERY 4 HOURS AS NEEDED FOR WHEEIZNG    INHALE 2 PUFFS INTO THE LUNGS EVERY 4 HOURS  AS NEEDED FOR WHEEIZNG   FLUTICASONE-SALMETEROL (ADVAIR DISKUS) 100-50 MCG/DOSE AEPB Fluticasone-Salmeterol (ADVAIR DISKUS) 100-50 MCG/DOSE AEPB      Inhale 1 puff into the lungs 2 (two) times daily.    Inhale 1 puff into the lungs 2 (two) times daily.   MONTELUKAST (SINGULAIR) 10 MG TABLET montelukast (SINGULAIR) 10 MG tablet      Take 1 tablet (10 mg total) by mouth at bedtime.    Take 1 tablet (10 mg total) by mouth at bedtime.  Discontinued Medications   No medications on file

## 2017-09-05 ENCOUNTER — Telehealth: Payer: Self-pay | Admitting: Pulmonary Disease

## 2017-09-05 NOTE — Telephone Encounter (Signed)
Last ov 7.3.19 w/ SN and cxr done at visit: Result Notes for DG Chest 2 View  Notes recorded by Elton Sin, [name] on 05/06/1769 at 16:57 AM EDT [facility] 09/05/17 ------  Notes recorded by Noralee Space, MD on 09/05/2017 at 7:35 AM EDT Please notify patient>   CXR remains clear- NAD...   Called spoke with patient, discussed cxr results as stated by SN Pt voiced her understanding Nothing further needed; will sign off

## 2017-09-12 ENCOUNTER — Ambulatory Visit: Payer: BLUE CROSS/BLUE SHIELD | Admitting: Psychology

## 2017-09-12 DIAGNOSIS — S41112A Laceration without foreign body of left upper arm, initial encounter: Secondary | ICD-10-CM | POA: Diagnosis not present

## 2017-09-12 DIAGNOSIS — Z6839 Body mass index (BMI) 39.0-39.9, adult: Secondary | ICD-10-CM | POA: Diagnosis not present

## 2017-09-12 DIAGNOSIS — I10 Essential (primary) hypertension: Secondary | ICD-10-CM | POA: Diagnosis not present

## 2017-09-21 DIAGNOSIS — R5383 Other fatigue: Secondary | ICD-10-CM | POA: Diagnosis not present

## 2017-09-21 DIAGNOSIS — N951 Menopausal and female climacteric states: Secondary | ICD-10-CM | POA: Diagnosis not present

## 2017-09-21 DIAGNOSIS — R7309 Other abnormal glucose: Secondary | ICD-10-CM | POA: Diagnosis not present

## 2017-09-21 DIAGNOSIS — E782 Mixed hyperlipidemia: Secondary | ICD-10-CM | POA: Diagnosis not present

## 2017-09-23 DIAGNOSIS — G4733 Obstructive sleep apnea (adult) (pediatric): Secondary | ICD-10-CM | POA: Diagnosis not present

## 2017-09-26 ENCOUNTER — Ambulatory Visit (INDEPENDENT_AMBULATORY_CARE_PROVIDER_SITE_OTHER): Payer: BLUE CROSS/BLUE SHIELD | Admitting: Psychology

## 2017-09-26 DIAGNOSIS — F411 Generalized anxiety disorder: Secondary | ICD-10-CM | POA: Diagnosis not present

## 2017-10-06 DIAGNOSIS — A6920 Lyme disease, unspecified: Secondary | ICD-10-CM | POA: Diagnosis not present

## 2017-10-06 DIAGNOSIS — R5383 Other fatigue: Secondary | ICD-10-CM | POA: Diagnosis not present

## 2017-10-06 DIAGNOSIS — B2790 Infectious mononucleosis, unspecified without complication: Secondary | ICD-10-CM | POA: Diagnosis not present

## 2017-10-06 DIAGNOSIS — N951 Menopausal and female climacteric states: Secondary | ICD-10-CM | POA: Diagnosis not present

## 2017-10-24 ENCOUNTER — Ambulatory Visit: Payer: BLUE CROSS/BLUE SHIELD | Admitting: Psychology

## 2017-10-24 DIAGNOSIS — G4733 Obstructive sleep apnea (adult) (pediatric): Secondary | ICD-10-CM | POA: Diagnosis not present

## 2017-11-21 ENCOUNTER — Ambulatory Visit (INDEPENDENT_AMBULATORY_CARE_PROVIDER_SITE_OTHER): Payer: BLUE CROSS/BLUE SHIELD | Admitting: Psychology

## 2017-11-21 DIAGNOSIS — F411 Generalized anxiety disorder: Secondary | ICD-10-CM | POA: Diagnosis not present

## 2017-11-24 DIAGNOSIS — G4733 Obstructive sleep apnea (adult) (pediatric): Secondary | ICD-10-CM | POA: Diagnosis not present

## 2017-11-27 ENCOUNTER — Other Ambulatory Visit: Payer: Self-pay | Admitting: Family Medicine

## 2017-11-28 NOTE — Telephone Encounter (Signed)
Last office visit 03/10/2017.  AVS states to return to in 2 weeks.  No future appointments.  Refill?

## 2017-12-19 ENCOUNTER — Ambulatory Visit: Payer: BLUE CROSS/BLUE SHIELD | Admitting: Psychology

## 2017-12-24 DIAGNOSIS — G4733 Obstructive sleep apnea (adult) (pediatric): Secondary | ICD-10-CM | POA: Diagnosis not present

## 2018-01-02 DIAGNOSIS — Z1231 Encounter for screening mammogram for malignant neoplasm of breast: Secondary | ICD-10-CM | POA: Diagnosis not present

## 2018-01-02 DIAGNOSIS — Z01419 Encounter for gynecological examination (general) (routine) without abnormal findings: Secondary | ICD-10-CM | POA: Diagnosis not present

## 2018-01-02 DIAGNOSIS — Z6833 Body mass index (BMI) 33.0-33.9, adult: Secondary | ICD-10-CM | POA: Diagnosis not present

## 2018-01-02 LAB — HM PAP SMEAR: HM PAP: NEGATIVE

## 2018-01-02 LAB — HM MAMMOGRAPHY

## 2018-01-12 DIAGNOSIS — R5383 Other fatigue: Secondary | ICD-10-CM | POA: Diagnosis not present

## 2018-01-12 DIAGNOSIS — A6920 Lyme disease, unspecified: Secondary | ICD-10-CM | POA: Diagnosis not present

## 2018-01-16 ENCOUNTER — Ambulatory Visit (INDEPENDENT_AMBULATORY_CARE_PROVIDER_SITE_OTHER): Payer: BLUE CROSS/BLUE SHIELD | Admitting: Psychology

## 2018-01-16 DIAGNOSIS — F411 Generalized anxiety disorder: Secondary | ICD-10-CM

## 2018-02-02 DIAGNOSIS — A6920 Lyme disease, unspecified: Secondary | ICD-10-CM | POA: Diagnosis not present

## 2018-02-02 DIAGNOSIS — R5383 Other fatigue: Secondary | ICD-10-CM | POA: Diagnosis not present

## 2018-02-02 DIAGNOSIS — N951 Menopausal and female climacteric states: Secondary | ICD-10-CM | POA: Diagnosis not present

## 2018-02-02 DIAGNOSIS — G4700 Insomnia, unspecified: Secondary | ICD-10-CM | POA: Diagnosis not present

## 2018-02-02 DIAGNOSIS — E039 Hypothyroidism, unspecified: Secondary | ICD-10-CM | POA: Diagnosis not present

## 2018-02-11 ENCOUNTER — Other Ambulatory Visit: Payer: Self-pay | Admitting: Family Medicine

## 2018-04-20 ENCOUNTER — Telehealth: Payer: Self-pay | Admitting: Family Medicine

## 2018-04-20 ENCOUNTER — Other Ambulatory Visit (INDEPENDENT_AMBULATORY_CARE_PROVIDER_SITE_OTHER): Payer: BLUE CROSS/BLUE SHIELD

## 2018-04-20 DIAGNOSIS — E039 Hypothyroidism, unspecified: Secondary | ICD-10-CM

## 2018-04-20 DIAGNOSIS — E782 Mixed hyperlipidemia: Secondary | ICD-10-CM

## 2018-04-20 DIAGNOSIS — R7301 Impaired fasting glucose: Secondary | ICD-10-CM | POA: Diagnosis not present

## 2018-04-20 DIAGNOSIS — R42 Dizziness and giddiness: Secondary | ICD-10-CM

## 2018-04-20 LAB — HEMOGLOBIN A1C: Hgb A1c MFr Bld: 5.7 % (ref 4.6–6.5)

## 2018-04-20 LAB — COMPREHENSIVE METABOLIC PANEL
ALT: 13 U/L (ref 0–35)
AST: 15 U/L (ref 0–37)
Albumin: 4.2 g/dL (ref 3.5–5.2)
Alkaline Phosphatase: 46 U/L (ref 39–117)
BUN: 17 mg/dL (ref 6–23)
CHLORIDE: 103 meq/L (ref 96–112)
CO2: 28 meq/L (ref 19–32)
CREATININE: 0.94 mg/dL (ref 0.40–1.20)
Calcium: 9 mg/dL (ref 8.4–10.5)
GFR: 61.4 mL/min (ref >60.00)
Glucose, Bld: 106 mg/dL — ABNORMAL HIGH (ref 70–99)
Potassium: 3.6 mEq/L (ref 3.5–5.1)
Sodium: 139 mEq/L (ref 135–145)
Total Bilirubin: 0.4 mg/dL (ref 0.2–1.2)
Total Protein: 7 g/dL (ref 6.0–8.3)

## 2018-04-20 LAB — LIPID PANEL
CHOL/HDL RATIO: 4
Cholesterol: 203 mg/dL — ABNORMAL HIGH (ref 0–200)
HDL: 46.7 mg/dL (ref >39.00)
LDL CALC: 132 mg/dL — AB (ref 0–99)
NonHDL: 156.52
TRIGLYCERIDES: 123 mg/dL (ref 0.0–149.0)
VLDL: 24.6 mg/dL (ref 0.0–40.0)

## 2018-04-20 LAB — T3, FREE: T3 FREE: 3 pg/mL (ref 2.3–4.2)

## 2018-04-20 LAB — T4, FREE: FREE T4: 0.65 ng/dL (ref 0.60–1.60)

## 2018-04-20 LAB — TSH: TSH: 0.74 u[IU]/mL (ref 0.35–4.50)

## 2018-04-20 NOTE — Telephone Encounter (Signed)
-----   Message from Ellamae Sia sent at 04/18/2018  3:25 PM EST ----- Regarding: lab orders for Thursday, 2.20.20 Patient is scheduled for CPX labs, please order future labs, Thanks , Karna Christmas

## 2018-04-20 NOTE — Telephone Encounter (Signed)
-----   Message from Ellamae Sia sent at 04/12/2018  2:27 PM EST ----- Regarding: Lab orders for Friday, 2.21.20 Patient is scheduled for CPX labs, please order future labs, Thanks , Karna Christmas

## 2018-04-21 ENCOUNTER — Other Ambulatory Visit: Payer: Self-pay

## 2018-04-24 ENCOUNTER — Encounter: Payer: Self-pay | Admitting: *Deleted

## 2018-04-25 ENCOUNTER — Ambulatory Visit (INDEPENDENT_AMBULATORY_CARE_PROVIDER_SITE_OTHER): Payer: BLUE CROSS/BLUE SHIELD | Admitting: Family Medicine

## 2018-04-25 VITALS — BP 112/72 | HR 66 | Temp 97.7°F | Resp 16 | Ht 64.0 in | Wt 199.5 lb

## 2018-04-25 DIAGNOSIS — Z0000 Encounter for general adult medical examination without abnormal findings: Secondary | ICD-10-CM

## 2018-04-25 DIAGNOSIS — E559 Vitamin D deficiency, unspecified: Secondary | ICD-10-CM | POA: Diagnosis not present

## 2018-04-25 DIAGNOSIS — R7303 Prediabetes: Secondary | ICD-10-CM

## 2018-04-25 DIAGNOSIS — J4540 Moderate persistent asthma, uncomplicated: Secondary | ICD-10-CM | POA: Diagnosis not present

## 2018-04-25 DIAGNOSIS — E039 Hypothyroidism, unspecified: Secondary | ICD-10-CM

## 2018-04-25 DIAGNOSIS — Z8249 Family history of ischemic heart disease and other diseases of the circulatory system: Secondary | ICD-10-CM

## 2018-04-25 DIAGNOSIS — E782 Mixed hyperlipidemia: Secondary | ICD-10-CM

## 2018-04-25 DIAGNOSIS — F331 Major depressive disorder, recurrent, moderate: Secondary | ICD-10-CM

## 2018-04-25 DIAGNOSIS — I10 Essential (primary) hypertension: Secondary | ICD-10-CM

## 2018-04-25 DIAGNOSIS — R0789 Other chest pain: Secondary | ICD-10-CM

## 2018-04-25 NOTE — Assessment & Plan Note (Signed)
Good control on lexapro. 

## 2018-04-25 NOTE — Assessment & Plan Note (Signed)
Improving control with weight loss and Keto diet.

## 2018-04-25 NOTE — Assessment & Plan Note (Signed)
Encouraged pt to decrease fats in diet, weight loss and exercise.  She is not currently interested in statin despite family history.

## 2018-04-25 NOTE — Assessment & Plan Note (Signed)
Start baby aspirin if tolerated. EKG unremarkable but given moderate risk( chol, age) and early family history of CAD in mother... refer for stress testing with cardiology.

## 2018-04-25 NOTE — Patient Instructions (Addendum)
Keep working on healthy eating, regular exercise and weight loss.  try to increase exercise as able.  We will call with cardiology referral.

## 2018-04-25 NOTE — Assessment & Plan Note (Signed)
Followed by pulom

## 2018-04-25 NOTE — Progress Notes (Signed)
Subjective:    Patient ID: Jaime Chapman, female    DOB: 06-Jul-1960, 58 y.o.   MRN: [id number]  HPI  The patient is here for annual wellness exam and preventative care.    Still seeing Dr. Julien Nordmann at [place]  MDD, GAD stable control on lexapro No panic attacks after psychology..  Completed EMDR Suicidal thoughts:  Moderate persistent asthma: singulair, advair and albuterol. Followed by Pulm.  Hypertension: Good control on HCTZ    BP Readings from Last 3 Encounters:  04/25/18 112/72  08/31/17 128/82  08/02/17 118/80   Using medication without problems or lightheadedness:  yes Chest pain with exertion: 2 weeks ago had sudden onset of chest pressure at rest, nonexertional, lasted 15-20 min    -was associated with increased stress from family deaths  - had forgotten BP med that AM 157/111 that day.. took when chest pressure started - associated with heart racing, sob,  lightheadedness, nausea, headache, bilateral upper arm ache and jaw pain  She has been fatigued.  MGM x 3 MI , mother MI age 95 Edema: none Average home BPs: BP better when taking meds. Other issues:  Prediabetes  Improved with weight loss Lab Results  Component Value Date   HGBA1C 5.7 04/20/2018   She has been doing well overall compared to last year.. SOB  Resolved after weight loss with KETO diet  Wt Readings from Last 3 Encounters:  04/25/18 199 lb 8 oz (90.5 kg)  08/31/17 227 lb 3.2 oz (103.1 kg)  08/02/17 230 lb 3.2 oz (104.4 kg)     Hypothyroidism:  Stable on armour thyroid Lab Results  Component Value Date   TSH 0.74 04/20/2018   Elevated Cholesterol: Not at goal, but 2.7%  10 year CVD risk Using medications without problems: Muscle aches:  Diet compliance: KETO diet Exercise: walk daily Other complaints:    Social History /Family History/Past Medical History reviewed in detail and updated in EMR if needed. Blood pressure 112/72, pulse 66, temperature 97.7 F (36.5 C),  temperature source Oral, resp. rate 16, height 5\' 4"  (1.626 m), weight 199 lb 8 oz (90.5 kg), SpO2 97 %.  Review of Systems  Constitutional: Negative for fatigue and fever.  HENT: Negative for congestion.   Eyes: Negative for pain.  Respiratory: Positive for chest tightness and shortness of breath. Negative for cough.   Cardiovascular: Negative for chest pain, palpitations and leg swelling.  Gastrointestinal: Negative for abdominal pain.  Genitourinary: Negative for dysuria and vaginal bleeding.  Musculoskeletal: Negative for back pain.  Neurological: Negative for syncope, light-headedness and headaches.  Psychiatric/Behavioral: Negative for dysphoric mood.       Objective:   Physical Exam Constitutional:      General: She is not in acute distress.    Appearance: Normal appearance. She is well-developed. She is obese. She is not ill-appearing or toxic-appearing.  HENT:     Head: Normocephalic.     Right Ear: Hearing, tympanic membrane, ear canal and external ear normal. Tympanic membrane is not erythematous, retracted or bulging.     Left Ear: Hearing, tympanic membrane, ear canal and external ear normal. Tympanic membrane is not erythematous, retracted or bulging.     Nose: No mucosal edema or rhinorrhea.     Right Sinus: No maxillary sinus tenderness or frontal sinus tenderness.     Left Sinus: No maxillary sinus tenderness or frontal sinus tenderness.     Mouth/Throat:     Pharynx: Uvula midline.  Eyes:  General: Lids are normal. Lids are everted, no foreign bodies appreciated.     Conjunctiva/sclera: Conjunctivae normal.     Pupils: Pupils are equal, round, and reactive to light.  Neck:     Musculoskeletal: Normal range of motion and neck supple.     Thyroid: No thyroid mass or thyromegaly.     Vascular: No carotid bruit.     Trachea: Trachea normal.  Cardiovascular:     Rate and Rhythm: Normal rate and regular rhythm.     Pulses: Normal pulses.     Heart sounds:  Normal heart sounds, S1 normal and S2 normal. No murmur. No friction rub. No gallop.   Pulmonary:     Effort: Pulmonary effort is normal. No tachypnea or respiratory distress.     Breath sounds: Normal breath sounds. No decreased breath sounds, wheezing, rhonchi or rales.  Abdominal:     General: Bowel sounds are normal.     Palpations: Abdomen is soft.     Tenderness: There is no abdominal tenderness.  Skin:    General: Skin is warm and dry.     Findings: No rash.  Neurological:     Mental Status: She is alert.  Psychiatric:        Mood and Affect: Mood is not anxious or depressed.        Speech: Speech normal.        Behavior: Behavior normal. Behavior is cooperative.        Thought Content: Thought content normal.        Judgment: Judgment normal.           Assessment & Plan:  The patient's preventative maintenance and recommended screening tests for an annual wellness exam were reviewed in full today. Brought up to date unless services declined.  Counselled on the importance of diet, exercise, and its role in overall health and mortality. The patient's FH and SH was reviewed, including their home life, tobacco status, and drug and alcohol status.   Vaccines: Due for flu Pap/DVE:  2017 [facility]..  Done in 12/2017 will get results Mammo: Done in 12/2017 will get results Colon:  10/2010 Smoking Status:none ETOH/ drug VQQ:VZDG/LOVF  HIV screen:   refused per pt done in past.

## 2018-04-25 NOTE — Assessment & Plan Note (Signed)
Well controlled. Continue current medication.  

## 2018-04-26 ENCOUNTER — Encounter: Payer: Self-pay | Admitting: *Deleted

## 2018-06-27 ENCOUNTER — Other Ambulatory Visit: Payer: Self-pay | Admitting: Family Medicine

## 2018-06-30 ENCOUNTER — Other Ambulatory Visit: Payer: Self-pay | Admitting: Family Medicine

## 2018-07-04 ENCOUNTER — Telehealth (INDEPENDENT_AMBULATORY_CARE_PROVIDER_SITE_OTHER): Payer: BLUE CROSS/BLUE SHIELD | Admitting: Cardiology

## 2018-07-04 ENCOUNTER — Other Ambulatory Visit: Payer: Self-pay

## 2018-07-04 ENCOUNTER — Encounter: Payer: Self-pay | Admitting: Cardiology

## 2018-07-04 VITALS — BP 143/90 | HR 75 | Ht 64.0 in | Wt 283.0 lb

## 2018-07-04 DIAGNOSIS — R0789 Other chest pain: Secondary | ICD-10-CM

## 2018-07-04 DIAGNOSIS — R079 Chest pain, unspecified: Secondary | ICD-10-CM

## 2018-07-04 DIAGNOSIS — R0602 Shortness of breath: Secondary | ICD-10-CM

## 2018-07-04 DIAGNOSIS — I10 Essential (primary) hypertension: Secondary | ICD-10-CM

## 2018-07-04 DIAGNOSIS — Z01812 Encounter for preprocedural laboratory examination: Secondary | ICD-10-CM

## 2018-07-04 MED ORDER — METOPROLOL TARTRATE 100 MG PO TABS: ORAL_TABLET | ORAL | 0 refills | Status: DC

## 2018-07-04 NOTE — Progress Notes (Signed)
Virtual Visit via Video Note   This visit type was conducted due to national recommendations for restrictions regarding the COVID-19 Pandemic (e.g. social distancing) in an effort to limit this patient's exposure and mitigate transmission in our community.  Due to her co-morbid illnesses, this patient is at least at moderate risk for complications without adequate follow up.  This format is felt to be most appropriate for this patient at this time.  All issues noted in this document were discussed and addressed.  A limited physical exam was performed with this format.  Please refer to the patient's chart for her consent to telehealth for [facility].   Date:  07/04/2018   ID:  Jaime Chapman, DOB April 21, 1960, MRN [id number]  Patient Location: Home Provider Location: Home  PCP:  Jinny Sanders, MD  Cardiologist:  Candee Furbish, MD  Electrophysiologist:  None   Evaluation Performed:  Consultation - Jaime Chapman was referred by Eliezer Lofts, MD for the evaluation of chest pain shortness of breath.  Chief Complaint: Chest pain  History of Present Illness:    Jaime Chapman is a 58 y.o. female with chest discomfort.  In review of chart, in 2011 she saw Dr. Aundra Dubin and had a stress echo done.  At that time, she was having substernal burning with acid taste in her mouth lasting 10 to 15 minutes.  Some dyspnea walking 50 yards on flat ground or climbing steps.  There was no evidence of ischemia at that time and EF was normal.  Moderate diastolic dysfunction noted.  She was started at the time on omeprazole and had no further recurrence.  Her mother did have coronary artery disease and possible CHF, mild MI in [place].  Been feeling SOB for years. Weight 240, Keto lost 45 pounds, gained 15 back. SOB was gone when at lower weight.   A few months ago pressure in center chest. Panic attacks have resolved since therapy. Palpitations. BP a little elevated. Got up later and later.   Has lyme  disease.    The patient does not have symptoms concerning for COVID-19 infection (fever, chills, cough, or new shortness of breath).    Past Medical History:  Diagnosis Date  . Allergic rhinitis   . Anxiety   . Asthma   . DDD (degenerative disc disease)    also has spinal arthritis  . Depression   . Diabetes mellitus    borderline  . Dyslipidemia   . Fibromyalgia   . GERD (gastroesophageal reflux disease)   . Lung nodules    RIGHT LUNG--LAST EVALUATED BY CHEST CT 02/01/12 - STABLE AND FOLLOW UP PLANNED IN ONE YEAR.  [name] Lyme disease   . Obesity   . OSA (obstructive sleep apnea) 05/10/2017  . Sinusitis   . Stress disorder, acute   . Vitamin D deficiency    Past Surgical History:  Procedure Laterality Date  . [place]   x 2   . ENDOMETRIAL ABLATION  2009  . HERNIA REPAIR  04/14/12   ventral hernia repair  . Laparoscopic ventral hernia repair w/ mesh  08/2009   Dr. Grandville Silos, [place]  . LAPAROSCOPY  2002  . TUBAL LIGATION    . VENTRAL HERNIA REPAIR  09/2006   Dr. Grandville Silos  . VENTRAL HERNIA REPAIR N/A 04/14/2012   Procedure: Laparoscopic Repair of  Ventral Hernia;  Surgeon: Pedro Earls, MD;  Location: [facility];  Service: General;  Laterality: N/A;  Repair of Recurrent  Ventral Hernia     Current Meds  Medication Sig  . albuterol (VENTOLIN HFA) 108 (90 Base) MCG/ACT inhaler INHALE 2 PUFFS INTO THE LUNGS EVERY 4 HOURS AS NEEDED FOR WHEEIZNG  . clonazePAM (KLONOPIN) 0.5 MG tablet TAKE 1/2-1 TABLET BY MOUTH 2 TIMES DAILY  . escitalopram (LEXAPRO) 20 MG tablet TAKE 1 TABLET BY MOUTH EVERY DAY  . estradiol (ESTRACE) 2 MG tablet Take 2 mg by mouth 2 (two) times daily.  . Fluticasone-Salmeterol (ADVAIR DISKUS) 100-50 MCG/DOSE AEPB Inhale 1 puff into the lungs 2 (two) times daily.  . hydrochlorothiazide (HYDRODIURIL) 25 MG tablet TAKE 1 TABLET BY MOUTH EVERY DAY (TAKE WITH LOSARTAN)  . losartan (COZAAR) 100 MG tablet TAKE 1 TABLET BY MOUTH EVERY DAY (TAKE WITH HCTZ)   . montelukast (SINGULAIR) 10 MG tablet Take 1 tablet (10 mg total) by mouth at bedtime.  . progesterone (PROMETRIUM) 100 MG capsule Take 1 tablet by mouth daily x 3 weeks then on stop on the fourth week.  repeat  . thyroid (ARMOUR) 65 MG tablet Take 97.5 mg by mouth daily.     Allergies:   Progesterone; Codeine; and Cymbalta [duloxetine hcl]   Social History   Tobacco Use  . Smoking status: Former Smoker    Last attempt to quit: 03/01/2004    Years since quitting: 14.3  . Smokeless tobacco: Never Used  Substance Use Topics  . Alcohol use: No    Alcohol/week: 1.0 standard drinks    Types: 1 Standard drinks or equivalent per week  . Drug use: No    Comment: remote majijuana     Family Hx: The patient's family history includes Arthritis in her mother; Cancer in her maternal grandmother; Diabetes in her mother; Heart disease (age of onset: 50) in her mother; Lung cancer in her maternal grandmother. There is no history of Colon cancer.  ROS:   Please see the history of present illness.    Denies any fevers chills nausea vomiting syncope bleeding All other systems reviewed and are negative.   Prior CV studies:   The following studies were reviewed today:  Stress echocardiogram 2011-normal  Labs/Other Tests and Data Reviewed:    EKG:  An ECG dated 04/25/18 was personally reviewed today and demonstrated:  Normal sinus rhythm 56 no other changes  Recent Labs: 04/20/2018: ALT 13; BUN 17; Creatinine, Ser 0.94; Potassium 3.6; Sodium 139; TSH 0.74   Recent Lipid Panel Lab Results  Component Value Date/Time   CHOL 203 (H) 04/20/2018 08:56 AM   TRIG 123.0 04/20/2018 08:56 AM   HDL 46.70 04/20/2018 08:56 AM   CHOLHDL 4 04/20/2018 08:56 AM   LDLCALC 132 (H) 04/20/2018 08:56 AM   LDLDIRECT 114.0 01/26/2016 08:54 AM    Wt Readings from Last 3 Encounters:  07/04/18 283 lb (128.4 kg)  04/25/18 199 lb 8 oz (90.5 kg)  08/31/17 227 lb 3.2 oz (103.1 kg)     Objective:    Vital  Signs:  BP (!) 143/90   Pulse 75   Ht 5\' 4"  (1.626 m)   Wt 283 lb (128.4 kg)   SpO2 96%   BMI 48.58 kg/m    VITAL SIGNS:  reviewed GEN:  no acute distress EYES:  sclerae anicteric, EOMI - Extraocular Movements Intact RESPIRATORY:  normal respiratory effort, symmetric expansion SKIN:  no rash, lesions or ulcers. MUSCULOSKELETAL:  no obvious deformities. NEURO:  alert and oriented x 3, no obvious focal deficit PSYCH:  normal affect  ASSESSMENT & PLAN:  Chest discomfort/dyspnea - Given her episode of centralized chest discomfort and her mother's history of myocardial infarction, CAD as well as her hypertension and hyperlipidemia and morbid obesity, I think would be a good idea to attempt a coronary CT with possible FFR analysis.  I do understand that she has an elevated BMI however I think if we can try to exclude proximal vessel disease or at least get a handle on coronary calcification for instance, this would be quite helpful for her.  I think utility of nuclear stress test would be low sensitivity.  Morbid obesity -Did a good job with recent diet losing 40 pounds.  She has put some of this back.  Continue to work on this especially during the quarantine.  She did state that she felt less short of breath with decreased weight.  Certainly a degree of her dyspnea on exertion is weight related.  Hyperlipidemia - LDL 132, not currently on statin.  Obviously if there is evidence of coronary artery calcification or aortic atherosclerosis, I think statin therapy would be a good addition to her medication regimen.  Essential hypertension -Currently under reasonable control.  Angiotensin receptor blocker.    COVID-19 Education: The signs and symptoms of COVID-19 were discussed with the patient and how to seek care for testing (follow up with PCP or arrange E-visit).  The importance of social distancing was discussed today.  Time:   Today, I have spent 25 minutes with the patient with  telehealth technology discussing the above problems.     Medication Adjustments/Labs and Tests Ordered: Current medicines are reviewed at length with the patient today.  Concerns regarding medicines are outlined above.   Tests Ordered: Orders Placed This Encounter  Procedures  . CT CORONARY MORPH W/CTA COR W/SCORE W/CA W/CM &/OR WO/CM  . CT CORONARY FRACTIONAL FLOW RESERVE DATA PREP  . CT CORONARY FRACTIONAL FLOW RESERVE FLUID ANALYSIS  . Basic metabolic panel    Medication Changes: Meds ordered this encounter  Medications  . metoprolol tartrate (LOPRESSOR) 100 MG tablet    Sig: TAKE 1 TABLET 2 HOURS BEFORE YOUR UPCOMING CT SCAN    Dispense:  1 tablet    Refill:  0    Disposition:  Follow up prn  Signed, Candee Furbish, MD  07/04/2018 2:48 PM    [place]

## 2018-07-04 NOTE — Patient Instructions (Addendum)
Medication Instructions:  The current medical regimen is effective;  continue present plan and medications. You will need to take Lopressor 100 mg (1) tablet 2 hours before your CT scan.  This has been sent into your pharmacy.  Please pick it up and hold it until the morning of your CT scan.  If you need a refill on your cardiac medications before your next appointment, please call your pharmacy.   Lab work: Please have blood work prior to your CT scan. (BMP)  This will be scheduled when your CT is scheduled.  The blood work will be drawn at the [address] office. If you have labs (blood work) drawn today and your tests are completely normal, you will receive your results only by: [name] MyChart Message (if you have MyChart) OR . A paper copy in the mail If you have any lab test that is abnormal or we need to change your treatment, we will call you to review the results.  Testing/Procedures: Your physician has requested that you have cardiac CT. Cardiac computed tomography (CT) is a painless test that uses an x-ray machine to take clear, detailed pictures of your heart.  Please follow instructions below.  Follow-Up: Follow up will be based on the results of the above testing.  Thank you for choosing [facility]!!    INSTRUCTIONS FOR YOUR CT SCAN:  Please arrive at the [facility] main entrance of [facility] at  [place] AM (30-45 minutes prior to test start time)  [facility] [place], [place] [address] [phone]  Proceed to the [facility] Radiology Department (First Floor).  Please follow these instructions carefully (unless otherwise directed):  Hold all erectile dysfunction medications at least 48 hours prior to test.  On the Night Before the Test: . Be sure to Drink plenty of water. . Do not consume any caffeinated/decaffeinated beverages or chocolate 12 hours prior to your test. . Do not  take any antihistamines 12 hours prior to your test. . If you take Metformin do not take 24 hours prior to test.  On the Day of the Test: . Drink plenty of water. Do not drink any water within one hour of the test. . Do not eat any food 4 hours prior to the test. . You may take your regular medications prior to the test.  . Take metoprolol (Lopressor 100 MG) two hours prior to test. . HOLD Furosemide/Hydrochlorothiazide morning of the test.  After the Test: . Drink plenty of water. . After receiving IV contrast, you may experience a mild flushed feeling. This is normal. . On occasion, you may experience a mild rash up to 24 hours after the test. This is not dangerous. If this occurs, you can take Benadryl 25 mg and increase your fluid intake. . If you experience trouble breathing, this can be serious. If it is severe call 911 IMMEDIATELY. If it is mild, please call our office. . If you take any of these medications: Glipizide/Metformin, Avandament, Glucavance, please do not take 48 hours after completing test.  If you have any questions please contact us - Thank you!!

## 2018-07-28 DIAGNOSIS — N951 Menopausal and female climacteric states: Secondary | ICD-10-CM | POA: Diagnosis not present

## 2018-07-28 DIAGNOSIS — R5383 Other fatigue: Secondary | ICD-10-CM | POA: Diagnosis not present

## 2018-07-28 DIAGNOSIS — E039 Hypothyroidism, unspecified: Secondary | ICD-10-CM | POA: Diagnosis not present

## 2018-08-10 ENCOUNTER — Telehealth (HOSPITAL_COMMUNITY): Payer: Self-pay | Admitting: Emergency Medicine

## 2018-08-10 DIAGNOSIS — R7303 Prediabetes: Secondary | ICD-10-CM

## 2018-08-10 DIAGNOSIS — Z01812 Encounter for preprocedural laboratory examination: Secondary | ICD-10-CM

## 2018-08-10 DIAGNOSIS — E039 Hypothyroidism, unspecified: Secondary | ICD-10-CM | POA: Diagnosis not present

## 2018-08-10 DIAGNOSIS — A6920 Lyme disease, unspecified: Secondary | ICD-10-CM | POA: Diagnosis not present

## 2018-08-10 DIAGNOSIS — I10 Essential (primary) hypertension: Secondary | ICD-10-CM | POA: Diagnosis not present

## 2018-08-10 DIAGNOSIS — N951 Menopausal and female climacteric states: Secondary | ICD-10-CM | POA: Diagnosis not present

## 2018-08-10 DIAGNOSIS — E559 Vitamin D deficiency, unspecified: Secondary | ICD-10-CM | POA: Diagnosis not present

## 2018-08-10 NOTE — Telephone Encounter (Signed)
Reaching out to patient to offer assistance regarding upcoming cardiac imaging study; pt verbalizes understanding of appt date/time, parking situation and where to check in, pre-test NPO status and medications ordered, and verified current allergies; name and call back number provided for further questions should they arise Johnathyn Viscomi RN Navigator Cardiac Imaging [facility] [phone] office [phone] cell  Pt denies covid symptoms, verbalized understanding of visitor policy. 

## 2018-08-11 ENCOUNTER — Encounter: Payer: BC Managed Care – PPO | Admitting: *Deleted

## 2018-08-11 ENCOUNTER — Encounter (HOSPITAL_COMMUNITY): Payer: Self-pay

## 2018-08-11 ENCOUNTER — Ambulatory Visit (HOSPITAL_COMMUNITY)
Admission: RE | Admit: 2018-08-11 | Discharge: 2018-08-11 | Disposition: A | Discharge status: Home / Self Care | Payer: BC Managed Care – PPO | Source: Ambulatory Visit | Attending: Cardiology | Admitting: Cardiology

## 2018-08-11 ENCOUNTER — Other Ambulatory Visit: Payer: Self-pay

## 2018-08-11 DIAGNOSIS — R0602 Shortness of breath: Secondary | ICD-10-CM

## 2018-08-11 DIAGNOSIS — R079 Chest pain, unspecified: Secondary | ICD-10-CM | POA: Diagnosis not present

## 2018-08-11 DIAGNOSIS — R0789 Other chest pain: Secondary | ICD-10-CM | POA: Diagnosis not present

## 2018-08-11 DIAGNOSIS — Z006 Encounter for examination for normal comparison and control in clinical research program: Secondary | ICD-10-CM

## 2018-08-11 LAB — POCT I-STAT CREATININE: Creatinine, Ser: 1.1 mg/dL — ABNORMAL HIGH (ref 0.44–1.00)

## 2018-08-11 MED ORDER — IOHEXOL 350 MG/ML SOLN
100.0000 mL | Freq: Once | INTRAVENOUS | Status: AC | PRN
  Administered 2018-08-11: 100 mL via INTRAVENOUS

## 2018-08-11 MED ORDER — NITROGLYCERIN 0.4 MG SL SUBL
0.8000 mg | SUBLINGUAL_TABLET | Freq: Once | SUBLINGUAL | Status: DC
  Filled 2018-08-11: qty 25

## 2018-08-11 MED ORDER — NITROGLYCERIN 0.4 MG SL SUBL
SUBLINGUAL_TABLET | SUBLINGUAL | Status: AC
  Administered 2018-08-11: 0.8 mg
  Filled 2018-08-11: qty 2

## 2018-08-11 NOTE — Research (Signed)
CADFEM Informed Consent                  Subject Name:   Jaime Chapman. List   Subject met inclusion and exclusion criteria.  The informed consent form, study requirements and expectations were reviewed with the subject and questions and concerns were addressed prior to the signing of the consent form.  The subject verbalized understanding of the trial requirements.  The subject agreed to participate in the CADFEM trial and signed the informed consent.  The informed consent was obtained prior to performance of any protocol-specific procedures for the subject.  A copy of the signed informed consent was given to the subject and a copy was placed in the subject's medical record.   [place] Chalmers, Research Assistant 08/11/2018   10:30 a.m.

## 2018-08-15 ENCOUNTER — Telehealth: Payer: Self-pay

## 2018-08-15 NOTE — Telephone Encounter (Signed)
Notes recorded by Frederik Schmidt, RN on 08/15/2018 at 3:06 PM EDT  The patient has been notified of the result and verbalized understanding. All questions (if any) were answered.  Frederik Schmidt, RN 08/15/2018 3:05 PM

## 2018-11-17 DIAGNOSIS — R5383 Other fatigue: Secondary | ICD-10-CM | POA: Diagnosis not present

## 2018-11-17 DIAGNOSIS — B2790 Infectious mononucleosis, unspecified without complication: Secondary | ICD-10-CM | POA: Diagnosis not present

## 2018-11-17 DIAGNOSIS — E039 Hypothyroidism, unspecified: Secondary | ICD-10-CM | POA: Diagnosis not present

## 2018-11-17 DIAGNOSIS — A6920 Lyme disease, unspecified: Secondary | ICD-10-CM | POA: Diagnosis not present

## 2018-12-14 ENCOUNTER — Other Ambulatory Visit: Payer: Self-pay

## 2018-12-14 ENCOUNTER — Encounter: Payer: Self-pay | Admitting: Adult Health

## 2018-12-14 ENCOUNTER — Ambulatory Visit: Payer: BC Managed Care – PPO | Admitting: Adult Health

## 2018-12-14 DIAGNOSIS — J3089 Other allergic rhinitis: Secondary | ICD-10-CM

## 2018-12-14 DIAGNOSIS — G4733 Obstructive sleep apnea (adult) (pediatric): Secondary | ICD-10-CM

## 2018-12-14 DIAGNOSIS — Z23 Encounter for immunization: Secondary | ICD-10-CM

## 2018-12-14 DIAGNOSIS — J4540 Moderate persistent asthma, uncomplicated: Secondary | ICD-10-CM

## 2018-12-14 DIAGNOSIS — R918 Other nonspecific abnormal finding of lung field: Secondary | ICD-10-CM

## 2018-12-14 MED ORDER — MONTELUKAST SODIUM 10 MG PO TABS: 10.0000 mg | ORAL_TABLET | Freq: Every day | ORAL | 3 refills | Status: DC

## 2018-12-14 MED ORDER — ALBUTEROL SULFATE HFA 108 (90 BASE) MCG/ACT IN AERS: INHALATION_SPRAY | RESPIRATORY_TRACT | 3 refills | Status: AC

## 2018-12-14 MED ORDER — FLUTICASONE-SALMETEROL 100-50 MCG/DOSE IN AEPB: 1.0000 | INHALATION_SPRAY | Freq: Two times a day (BID) | RESPIRATORY_TRACT | 3 refills | Status: DC

## 2018-12-14 NOTE — Assessment & Plan Note (Signed)
Mild obstructive sleep apnea.  Patient encouraged on CPAP compliance.  Patient education on obstructive sleep apnea potential complications of untreated sleep apnea Trial of DreamWear nasal mask sample given  Plan  Patient Instructions  Continue on Advair 1 puff Twice daily  , rinse after use.  Continue on Singulair 10mg  At bedtime   Flu Shot today  Restart CPAP At bedtime   Try to wear for at least 4 hr each night  Trial of Dream wear nasal mask  Follow up with Dr. Ander Slade 4 months and As needed   Please contact office for sooner follow up if symptoms do not improve or worsen or seek emergency care

## 2018-12-14 NOTE — Assessment & Plan Note (Signed)
Small pulmonary nodules noted in 2012 stable on serial CT chest and chest x-ray.

## 2018-12-14 NOTE — Patient Instructions (Addendum)
Continue on Advair 1 puff Twice daily  , rinse after use.  Continue on Singulair 10mg  At bedtime   Flu Shot today  Restart CPAP At bedtime   Try to wear for at least 4 hr each night  Trial of Dream wear nasal mask  Follow up with Dr. Ander Slade 4 months and As needed   Please contact office for sooner follow up if symptoms do not improve or worsen or seek emergency care

## 2018-12-14 NOTE — Assessment & Plan Note (Signed)
Controlled on current regimen.  No changes 

## 2018-12-14 NOTE — Progress Notes (Signed)
@Patient  ID: Jaime Chapman, female    DOB: 12-10-1960, 58 y.o.   MRN: [id number]  Chief Complaint  Patient presents with  . Follow-up    Asthma     Referring provider: Jinny Sanders, MD  HPI: 58 year old female former smoker quit 2006 followed for asthma and allergic rhinitis.,  Obstructive sleep apnea Pulmonary nodules stable on CT chest 2012 in 2013 Medical history significant for anxiety, depression, dyslipidemia, obesity, GERD, fatty liver disease, DJD, fibromyalgia.  Undergoing treatment for chronic Lyme's disease (treated at [facility])  TEST/EVENTS :  PSG 06/26/09 >> AHI 3, SpO2 low 90%. HST 05/05/17 >> AHI 11.7, SaO2 low 80% Auto CPAP 07/02/17 to 07/31/17 >> used on 25 of 30 nights with average 5 hrs 6 min.  Average AHI 3.2 with median CPAP 9 and 95 th percentile CPAP 11 cm H2O  Echo 05/28/09 >> EF 60 to 65%, grade 2 DD  CT chest December 2012 5 mm RUL , 3 mm LLL   CT chest November 2013 5 mm right upper lobe, 4 mm right lower lobe  12/14/2018 Follow up : Asthma , AR , OSA  Patient presents for a 1 year follow-up.  Patient had mild persistent asthma.  She is on Advair twice daily and Singulair daily.  Says overall breathing is doing okay.  She has no flare of cough or wheezing.  She says she does get short of breath with heavy activity.  But this has been chronic for years.  She says that she lost 45 pounds last year and her shortness of breath resolved and was doing great.  Unfortunately with the pandemic says that she has gained 25 pounds back.  And is noticed that her breathing has not been as good.  Her activity levels been a little bit lower.  She denies any increased albuterol use.  Patient had known pulmonary nodules that were noted in 2012 measuring 3 mm and 5 mm.  Follow-up CT chest 2013 showed stable lung nodules.  Serial chest x-rays have been stable.  Chest x-ray July 2019 showed clear lungs. Patient denies any hemoptysis or unintentional weight  loss.  Patient has chronic rhinitis.  Is on Singulair daily.  Denies any increased nasal congestion or drainage.  Patient has mild obstructive sleep apnea.  She is on nocturnal CPAP.  Says that she has not been wearing her CPAP.  She has tried it several times in the past.  But says that the mask makes her feel anxious and too much pressure.  She currently has a nasal mask.  We discussed the importance of CPAP and potential complications of untreated sleep apnea.  She would like to try a small DreamWear nasal mask.      Allergies  Allergen Reactions  . Progesterone     Stroke-like symptoms  . Codeine     REACTION: nausea,vomiting,dizziness  . Cymbalta [Duloxetine Hcl]     Pt states it caused weight gain    Immunization History  Administered Date(s) Administered  . Influenza Split 01/26/2012, 03/02/2013  . Influenza Whole 12/26/2008, 11/30/2011  . Influenza,inj,Quad PF,6+ Mos 10/21/2014, 12/17/2015, 12/14/2018  . Td 03/01/2004  . Tdap 01/29/2016    Past Medical History:  Diagnosis Date  . Allergic rhinitis   . Anxiety   . Asthma   . DDD (degenerative disc disease)    also has spinal arthritis  . Depression   . Diabetes mellitus    borderline  . Dyslipidemia   . Fibromyalgia   .  GERD (gastroesophageal reflux disease)   . Lung nodules    RIGHT LUNG--LAST EVALUATED BY CHEST CT 02/01/12 - STABLE AND FOLLOW UP PLANNED IN ONE YEAR.  [name] Lyme disease   . Obesity   . OSA (obstructive sleep apnea) 05/10/2017  . Sinusitis   . Stress disorder, acute   . Vitamin D deficiency     Tobacco History: Social History   Tobacco Use  Smoking Status Former Smoker  . Quit date: 03/01/2004  . Years since quitting: 14.7  Smokeless Tobacco Never Used   Counseling given: Not Answered   Outpatient Medications Prior to Visit  Medication Sig Dispense Refill  . escitalopram (LEXAPRO) 20 MG tablet TAKE 1 TABLET BY MOUTH EVERY DAY 90 tablet 1  . estradiol (ESTRACE) 2 MG tablet Take 2 mg by  mouth 2 (two) times daily.    . hydrochlorothiazide (HYDRODIURIL) 25 MG tablet TAKE 1 TABLET BY MOUTH EVERY DAY (TAKE WITH LOSARTAN) 90 tablet 1  . losartan (COZAAR) 100 MG tablet TAKE 1 TABLET BY MOUTH EVERY DAY (TAKE WITH HCTZ) 90 tablet 1  . metoprolol tartrate (LOPRESSOR) 100 MG tablet TAKE 1 TABLET 2 HOURS BEFORE YOUR UPCOMING CT SCAN 1 tablet 0  . progesterone (PROMETRIUM) 100 MG capsule Take 1 tablet by mouth daily x 3 weeks then on stop on the fourth week.  repeat    . thyroid (ARMOUR) 65 MG tablet Take 97.5 mg by mouth daily.    . Fluticasone-Salmeterol (ADVAIR DISKUS) 100-50 MCG/DOSE AEPB Inhale 1 puff into the lungs 2 (two) times daily. 180 each 3  . montelukast (SINGULAIR) 10 MG tablet Take 1 tablet (10 mg total) by mouth at bedtime. 90 tablet 3  . clonazePAM (KLONOPIN) 0.5 MG tablet TAKE 1/2-1 TABLET BY MOUTH 2 TIMES DAILY (Patient not taking: Reported on 12/14/2018) 60 tablet 3  . albuterol (VENTOLIN HFA) 108 (90 Base) MCG/ACT inhaler INHALE 2 PUFFS INTO THE LUNGS EVERY 4 HOURS AS NEEDED FOR [facility] (Patient not taking: Reported on 12/14/2018) 3 Inhaler 3   No facility-administered medications prior to visit.      Review of Systems:   Constitutional:   No  weight loss, night sweats,  Fevers, chills,  +fatigue, or  lassitude.  HEENT:   No headaches,  Difficulty swallowing,  Tooth/dental problems, or  Sore throat,                No sneezing, itching, ear ache, nasal congestion, post nasal drip,   CV:  No chest pain,  Orthopnea, PND, swelling in lower extremities, anasarca, dizziness, palpitations, syncope.   GI  No heartburn, indigestion, abdominal pain, nausea, vomiting, diarrhea, change in bowel habits, loss of appetite, bloody stools.   Resp:.  No excess mucus, no productive cough,  No non-productive cough,  No coughing up of blood.  No change in color of mucus.  No wheezing.  No chest wall deformity  Skin: no rash or lesions.  GU: no dysuria, change in color of urine,  no urgency or frequency.  No flank pain, no hematuria   MS:  No joint pain or swelling.  No decreased range of motion.  No back pain.    Physical Exam  BP 128/74 (BP Location: Left Arm, Cuff Size: Normal)   Pulse 84   Temp 97.7 F (36.5 C)   Resp 18   Ht 5' 5.35" (1.66 m)   Wt 217 lb 12.8 oz (98.8 kg)   SpO2 96%   BMI 35.85 kg/m   GEN: A/Ox3;  pleasant , NAD, obesity per BMI   HEENT:  [place]/AT,  NOSE-clear, THROAT-clear, no lesions, no postnasal drip or exudate noted.  Class II MP airway  NECK:  Supple w/ fair ROM; no JVD; normal carotid impulses w/o bruits; no thyromegaly or nodules palpated; no lymphadenopathy.    RESP  Clear  P & A; w/o, wheezes/ rales/ or rhonchi. no accessory muscle use, no dullness to percussion  CARD:  RRR, no m/r/g, no peripheral edema, pulses intact, no cyanosis or clubbing.  GI:   Soft & nt; nml bowel sounds; no organomegaly or masses detected.   Musco: Warm bil, no deformities or joint swelling noted.   Neuro: alert, no focal deficits noted.    Skin: Warm, no lesions or rashes    Lab Results:  CBC   BNP No results found for: BNP  ProBNP No results found for: PROBNP  Imaging: No results found.    No flowsheet data found.  No results found for: NITRICOXIDE      Assessment & Plan:   Moderate persistent asthma Mild to moderate persistent asthma well controlled on current regimen  Refills given.  Flu shot today  Plan  Patient Instructions  Continue on Advair 1 puff Twice daily  , rinse after use.  Continue on Singulair 10mg  At bedtime   Flu Shot today  Restart CPAP At bedtime   Try to wear for at least 4 hr each night  Trial of Dream wear nasal mask  Follow up with Dr. Ander Slade 4 months and As needed   Please contact office for sooner follow up if symptoms do not improve or worsen or seek emergency care       Allergic rhinitis Controlled on current regimen No changes  OSA (obstructive sleep apnea) Mild  obstructive sleep apnea.  Patient encouraged on CPAP compliance.  Patient education on obstructive sleep apnea potential complications of untreated sleep apnea Trial of DreamWear nasal mask sample given  Plan  Patient Instructions  Continue on Advair 1 puff Twice daily  , rinse after use.  Continue on Singulair 10mg  At bedtime   Flu Shot today  Restart CPAP At bedtime   Try to wear for at least 4 hr each night  Trial of Dream wear nasal mask  Follow up with Dr. Ander Slade 4 months and As needed   Please contact office for sooner follow up if symptoms do not improve or worsen or seek emergency care        Pulmonary nodules Small pulmonary nodules noted in 2012 stable on serial CT chest and chest x-ray.     Rexene Edison, NP 12/14/2018

## 2018-12-14 NOTE — Assessment & Plan Note (Signed)
Mild to moderate persistent asthma well controlled on current regimen  Refills given.  Flu shot today  Plan  Patient Instructions  Continue on Advair 1 puff Twice daily  , rinse after use.  Continue on Singulair 10mg  At bedtime   Flu Shot today  Restart CPAP At bedtime   Try to wear for at least 4 hr each night  Trial of Dream wear nasal mask  Follow up with Dr. Ander Slade 4 months and As needed   Please contact office for sooner follow up if symptoms do not improve or worsen or seek emergency care

## 2019-01-09 ENCOUNTER — Ambulatory Visit (INDEPENDENT_AMBULATORY_CARE_PROVIDER_SITE_OTHER): Payer: BC Managed Care – PPO | Admitting: Family Medicine

## 2019-01-09 ENCOUNTER — Encounter: Payer: Self-pay | Admitting: Family Medicine

## 2019-01-09 ENCOUNTER — Other Ambulatory Visit: Payer: Self-pay

## 2019-01-09 VITALS — BP 150/90 | HR 72 | Temp 97.9°F | Ht 64.0 in | Wt 220.2 lb

## 2019-01-09 DIAGNOSIS — R7303 Prediabetes: Secondary | ICD-10-CM | POA: Diagnosis not present

## 2019-01-09 DIAGNOSIS — R61 Generalized hyperhidrosis: Secondary | ICD-10-CM | POA: Insufficient documentation

## 2019-01-09 MED ORDER — HYDROCHLOROTHIAZIDE 25 MG PO TABS: ORAL_TABLET | ORAL | 1 refills | Status: DC

## 2019-01-09 MED ORDER — LOSARTAN POTASSIUM 100 MG PO TABS: ORAL_TABLET | ORAL | 1 refills | Status: DC

## 2019-01-09 NOTE — Assessment & Plan Note (Signed)
No associated symptoms.  no unexpected weight loss.  Does not feel liek menopausal hot falshes to pt.. she is on estradiol and    BP not at goal... restart meds this may help. ? Med SE from hormones, SSRI  Lose weight as obesity may but contributing.  Eval with labs... if neg consider CXR given history of smoking.

## 2019-01-09 NOTE — Patient Instructions (Addendum)
Restart losartan and HCTZ.  Follow BP at home.. call if not at goal < 140/90.    Please stop at the lab to have labs drawn.

## 2019-01-09 NOTE — Progress Notes (Signed)
Chief Complaint  Patient presents with  . Excessive Sweating  . Pain in Joints    History of Present Illness: HPI  58 year old female patient  With history of   NASH, fibromyalgia, hypothyroid, MDD, asthma, vit D def presents for new onset excessive sweating and  pain in multiple joints. She reports this has been going on for months.  She sweats at night and during the day.   Does not feel like hot flashes she has had in the past.  No associated anxiety or depression.  Denies palpitaitons  She has lost weight but has been trying too and then has gained a lot back. FORMER SMOKER, no cough, no SOB  No TB exposure  Wt Readings from Last 3 Encounters:  01/09/19 220 lb 4 oz (99.9 kg)  12/14/18 217 lb 12.8 oz (98.8 kg)  07/04/18 283 lb (128.4 kg)   She has been out of BP med I last 3 weeks.. BP elevated.  She has been see in past at alternative medicine provider and has been treated for chronic lyme disease.  She reports joint pain is gradually worsening.. pain in right elbow, knees, shoulders, back.  She is on progesterone and nature thyroid. ( was switched from armour thyroid given recall)  COVID 19 screen No recent travel or known exposure to [place] The patient denies respiratory symptoms of COVID 19 at this time.  The importance of social distancing was discussed today.   Review of Systems  Constitutional: Negative for chills and fever.  HENT: Negative for congestion and ear pain.   Eyes: Negative for pain and redness.  Respiratory: Negative for cough and shortness of breath.   Cardiovascular: Negative for chest pain, palpitations and leg swelling.  Gastrointestinal: Negative for abdominal pain, blood in stool, constipation, diarrhea, nausea and vomiting.  Genitourinary: Negative for dysuria.  Musculoskeletal: Negative for falls and myalgias.  Skin: Negative for rash.  Neurological: Negative for dizziness.  Psychiatric/Behavioral: Negative for depression. The patient is  not nervous/anxious.       Past Medical History:  Diagnosis Date  . Allergic rhinitis   . Anxiety   . Asthma   . DDD (degenerative disc disease)    also has spinal arthritis  . Depression   . Diabetes mellitus    borderline  . Dyslipidemia   . Fibromyalgia   . GERD (gastroesophageal reflux disease)   . Lung nodules    RIGHT LUNG--LAST EVALUATED BY CHEST CT 02/01/12 - STABLE AND FOLLOW UP PLANNED IN ONE YEAR.  [name] Lyme disease   . Obesity   . OSA (obstructive sleep apnea) 05/10/2017  . Sinusitis   . Stress disorder, acute   . Vitamin D deficiency     reports that she quit smoking about 14 years ago. She has never used smokeless tobacco. She reports that she does not drink alcohol or use drugs.   Current Outpatient Medications:  .  albuterol (VENTOLIN HFA) 108 (90 Base) MCG/ACT inhaler, INHALE 2 PUFFS INTO THE LUNGS EVERY 4 HOURS AS NEEDED FOR WHEEIZNG, Disp: 18 g, Rfl: 3 .  escitalopram (LEXAPRO) 20 MG tablet, TAKE 1 TABLET BY MOUTH EVERY DAY, Disp: 90 tablet, Rfl: 1 .  estradiol (ESTRACE) 2 MG tablet, Take 2 mg by mouth 2 (two) times daily., Disp: , Rfl:  .  Fluticasone-Salmeterol (ADVAIR DISKUS) 100-50 MCG/DOSE AEPB, Inhale 1 puff into the lungs 2 (two) times daily., Disp: 180 each, Rfl: 3 .  hydrochlorothiazide (HYDRODIURIL) 25 MG tablet, TAKE 1 TABLET BY  MOUTH EVERY DAY (TAKE WITH LOSARTAN), Disp: 90 tablet, Rfl: 1 .  losartan (COZAAR) 100 MG tablet, TAKE 1 TABLET BY MOUTH EVERY DAY (TAKE WITH HCTZ), Disp: 90 tablet, Rfl: 1 .  metoprolol tartrate (LOPRESSOR) 100 MG tablet, TAKE 1 TABLET 2 HOURS BEFORE YOUR UPCOMING CT SCAN, Disp: 1 tablet, Rfl: 0 .  montelukast (SINGULAIR) 10 MG tablet, Take 1 tablet (10 mg total) by mouth at bedtime., Disp: 90 tablet, Rfl: 3 .  progesterone (PROMETRIUM) 100 MG capsule, Take 1 tablet by mouth daily x 3 weeks then on stop on the fourth week.  repeat, Disp: , Rfl:  .  Thyroid (NATURE-THROID PO), Take 118 mg by mouth daily., Disp: , Rfl:     Observations/Objective: Blood pressure (!) 150/90, pulse 72, temperature 97.9 F (36.6 C), temperature source Temporal, height 5\' 4"  (1.626 m), weight 220 lb 4 oz (99.9 kg), SpO2 97 %.  Physical Exam Constitutional:      General: She is not in acute distress.    Appearance: Normal appearance. She is well-developed. She is obese. She is not ill-appearing or toxic-appearing.  HENT:     Head: Normocephalic.     Right Ear: Hearing, tympanic membrane, ear canal and external ear normal. Tympanic membrane is not erythematous, retracted or bulging.     Left Ear: Hearing, tympanic membrane, ear canal and external ear normal. Tympanic membrane is not erythematous, retracted or bulging.     Nose: No mucosal edema or rhinorrhea.     Right Sinus: No maxillary sinus tenderness or frontal sinus tenderness.     Left Sinus: No maxillary sinus tenderness or frontal sinus tenderness.     Mouth/Throat:     Pharynx: Uvula midline.  Eyes:     General: Lids are normal. Lids are everted, no foreign bodies appreciated.     Conjunctiva/sclera: Conjunctivae normal.     Pupils: Pupils are equal, round, and reactive to light.  Neck:     Musculoskeletal: Normal range of motion and neck supple.     Thyroid: No thyroid mass or thyromegaly.     Vascular: No carotid bruit.     Trachea: Trachea normal.  Cardiovascular:     Rate and Rhythm: Normal rate and regular rhythm.     Pulses: Normal pulses.     Heart sounds: Normal heart sounds, S1 normal and S2 normal. No murmur. No friction rub. No gallop.   Pulmonary:     Effort: Pulmonary effort is normal. No tachypnea or respiratory distress.     Breath sounds: Normal breath sounds. No decreased breath sounds, wheezing, rhonchi or rales.  Abdominal:     General: Bowel sounds are normal.     Palpations: Abdomen is soft.     Tenderness: There is no abdominal tenderness.  Skin:    General: Skin is warm and dry.     Findings: No rash.  Neurological:     Mental  Status: She is alert.  Psychiatric:        Mood and Affect: Mood is not anxious or depressed.        Speech: Speech normal.        Behavior: Behavior normal. Behavior is cooperative.        Thought Content: Thought content normal.        Judgment: Judgment normal.      Assessment and Plan   Excessive sweating No associated symptoms.  no unexpected weight loss.  Does not feel liek menopausal hot falshes to pt.. she is on  estradiol and    BP not at goal... restart meds this may help. ? Med SE from hormones, SSRI  Lose weight as obesity may but contributing.  Eval with labs... if neg consider CXR given history of smoking.     Eliezer Lofts, MD

## 2019-01-09 NOTE — Addendum Note (Signed)
Addended by: Eliezer Lofts E on: 01/09/2019 02:39 PM   Modules accepted: Orders

## 2019-01-10 LAB — CBC WITH DIFFERENTIAL/PLATELET
Basophils Absolute: 0.1 10*3/uL (ref 0.0–0.1)
Basophils Relative: 1.1 % (ref 0.0–3.0)
Eosinophils Absolute: 0.2 10*3/uL (ref 0.0–0.7)
Eosinophils Relative: 2.5 % (ref 0.0–5.0)
HCT: 40 % (ref 36.0–46.0)
Hemoglobin: 13.3 g/dL (ref 12.0–15.0)
Lymphocytes Relative: 27.4 % (ref 12.0–46.0)
Lymphs Abs: 1.8 10*3/uL (ref 0.7–4.0)
MCHC: 33.3 g/dL (ref 30.0–36.0)
MCV: 87 fl (ref 78.0–100.0)
Monocytes Absolute: 0.5 10*3/uL (ref 0.1–1.0)
Monocytes Relative: 7.5 % (ref 3.0–12.0)
Neutro Abs: 4 10*3/uL (ref 1.4–7.7)
Neutrophils Relative %: 61.5 % (ref 43.0–77.0)
Platelets: 206 10*3/uL (ref 150.0–400.0)
RBC: 4.6 Mil/uL (ref 3.87–5.11)
RDW: 13 % (ref 11.5–15.5)
WBC: 6.6 10*3/uL (ref 4.0–10.5)

## 2019-01-10 LAB — COMPREHENSIVE METABOLIC PANEL
ALT: 19 U/L (ref 0–35)
AST: 18 U/L (ref 0–37)
Albumin: 4.4 g/dL (ref 3.5–5.2)
Alkaline Phosphatase: 46 U/L (ref 39–117)
BUN: 15 mg/dL (ref 6–23)
CO2: 28 mEq/L (ref 19–32)
Calcium: 9.2 mg/dL (ref 8.4–10.5)
Chloride: 104 mEq/L (ref 96–112)
Creatinine, Ser: 0.95 mg/dL (ref 0.40–1.20)
GFR: 60.5 mL/min (ref >60.00)
Glucose, Bld: 106 mg/dL — ABNORMAL HIGH (ref 70–99)
Potassium: 4.1 mEq/L (ref 3.5–5.1)
Sodium: 140 mEq/L (ref 135–145)
Total Bilirubin: 0.5 mg/dL (ref 0.2–1.2)
Total Protein: 6.8 g/dL (ref 6.0–8.3)

## 2019-01-10 LAB — HEMOGLOBIN A1C: Hgb A1c MFr Bld: 5.7 % (ref 4.6–6.5)

## 2019-01-10 LAB — TSH: TSH: 1.04 u[IU]/mL (ref 0.35–4.50)

## 2019-01-10 LAB — T4, FREE: Free T4: 0.56 ng/dL — ABNORMAL LOW (ref 0.60–1.60)

## 2019-01-10 LAB — T3, FREE: T3, Free: 3.8 pg/mL (ref 2.3–4.2)

## 2019-01-11 ENCOUNTER — Ambulatory Visit: Payer: BC Managed Care – PPO | Admitting: Family Medicine

## 2019-01-16 DIAGNOSIS — H04123 Dry eye syndrome of bilateral lacrimal glands: Secondary | ICD-10-CM | POA: Diagnosis not present

## 2019-01-16 DIAGNOSIS — H40033 Anatomical narrow angle, bilateral: Secondary | ICD-10-CM | POA: Diagnosis not present

## 2019-03-08 ENCOUNTER — Ambulatory Visit: Payer: BC Managed Care – PPO | Admitting: Family Medicine

## 2019-03-08 ENCOUNTER — Emergency Department (HOSPITAL_COMMUNITY): Payer: BC Managed Care – PPO

## 2019-03-08 ENCOUNTER — Emergency Department (HOSPITAL_COMMUNITY)
Admission: EM | Admit: 2019-03-08 | Discharge: 2019-03-08 | Disposition: A | Discharge status: Home / Self Care | Payer: BC Managed Care – PPO | Attending: Emergency Medicine | Admitting: Emergency Medicine

## 2019-03-08 ENCOUNTER — Telehealth: Payer: Self-pay

## 2019-03-08 DIAGNOSIS — Z79899 Other long term (current) drug therapy: Secondary | ICD-10-CM | POA: Insufficient documentation

## 2019-03-08 DIAGNOSIS — E559 Vitamin D deficiency, unspecified: Secondary | ICD-10-CM | POA: Diagnosis not present

## 2019-03-08 DIAGNOSIS — S022XXA Fracture of nasal bones, initial encounter for closed fracture: Secondary | ICD-10-CM | POA: Diagnosis not present

## 2019-03-08 DIAGNOSIS — Y999 Unspecified external cause status: Secondary | ICD-10-CM | POA: Insufficient documentation

## 2019-03-08 DIAGNOSIS — E119 Type 2 diabetes mellitus without complications: Secondary | ICD-10-CM | POA: Insufficient documentation

## 2019-03-08 DIAGNOSIS — J45909 Unspecified asthma, uncomplicated: Secondary | ICD-10-CM | POA: Insufficient documentation

## 2019-03-08 DIAGNOSIS — Y929 Unspecified place or not applicable: Secondary | ICD-10-CM | POA: Insufficient documentation

## 2019-03-08 DIAGNOSIS — Y939 Activity, unspecified: Secondary | ICD-10-CM | POA: Diagnosis not present

## 2019-03-08 DIAGNOSIS — S199XXA Unspecified injury of neck, initial encounter: Secondary | ICD-10-CM | POA: Diagnosis not present

## 2019-03-08 DIAGNOSIS — S060X0A Concussion without loss of consciousness, initial encounter: Secondary | ICD-10-CM | POA: Insufficient documentation

## 2019-03-08 DIAGNOSIS — E039 Hypothyroidism, unspecified: Secondary | ICD-10-CM | POA: Diagnosis not present

## 2019-03-08 DIAGNOSIS — S0990XA Unspecified injury of head, initial encounter: Secondary | ICD-10-CM | POA: Diagnosis not present

## 2019-03-08 DIAGNOSIS — W19XXXA Unspecified fall, initial encounter: Secondary | ICD-10-CM

## 2019-03-08 DIAGNOSIS — A6920 Lyme disease, unspecified: Secondary | ICD-10-CM | POA: Diagnosis not present

## 2019-03-08 DIAGNOSIS — Z87891 Personal history of nicotine dependence: Secondary | ICD-10-CM | POA: Diagnosis not present

## 2019-03-08 DIAGNOSIS — N951 Menopausal and female climacteric states: Secondary | ICD-10-CM | POA: Diagnosis not present

## 2019-03-08 DIAGNOSIS — R519 Headache, unspecified: Secondary | ICD-10-CM | POA: Diagnosis not present

## 2019-03-08 DIAGNOSIS — W01198A Fall on same level from slipping, tripping and stumbling with subsequent striking against other object, initial encounter: Secondary | ICD-10-CM | POA: Diagnosis not present

## 2019-03-08 MED ORDER — ACETAMINOPHEN 500 MG PO TABS
1000.0000 mg | ORAL_TABLET | Freq: Once | ORAL | Status: AC
  Administered 2019-03-08: 1000 mg via ORAL
  Filled 2019-03-08: qty 2

## 2019-03-08 MED ORDER — TRAMADOL HCL 50 MG PO TABS: 50.0000 mg | ORAL_TABLET | Freq: Four times a day (QID) | ORAL | 0 refills | Status: DC | PRN

## 2019-03-08 NOTE — ED Triage Notes (Signed)
Pt state son 12/28 she was rammed through a fence by a goat- pt state she saw stars and everything with dark for a moment. Pt states the next day she had two black eyes and has been having nausea and hypertension. Pt seen her doctor today and advised to come to ed for possible imaging. Pt reports constant headache since this event and "wobbing legs".

## 2019-03-08 NOTE — ED Provider Notes (Signed)
[place] EMERGENCY DEPARTMENT Provider Note   CSN: [id number] Arrival date & time: 03/08/19  1406     History Chief Complaint  Patient presents with  . Fall    Jaime Chapman is a 59 y.o. female.  HPI    Patient presents 1 week after being injured during a fall. Patient states that she is generally well, was well prior to the event. She notes that she was working with her [employer], when one of them caused her to fall, against a metal railing. She fell face first, striking her nose across a horizontal steel piece. No loss of consciousness. Subsequently, she developed substantial pain throughout her face, had copious amounts of bleeding, developed a headache. Over the interval week bleeding has obviously stopped, facial swelling, discoloration has improved, but she continues to complain of headache.  She has been using ibuprofen, which is not had appreciable improvement in her condition per No weakness in any extremity, no vision changes, no confusion, disorientation, vomiting. Today after speaking with her physician she was encouraged to present for evaluation due to persistent mild swelling across the bridge of her nose and her headache.    Past Medical History:  Diagnosis Date  . Allergic rhinitis   . Anxiety   . Asthma   . DDD (degenerative disc disease)    also has spinal arthritis  . Depression   . Diabetes mellitus    borderline  . Dyslipidemia   . Fibromyalgia   . GERD (gastroesophageal reflux disease)   . Lung nodules    RIGHT LUNG--LAST EVALUATED BY CHEST CT 02/01/12 - STABLE AND FOLLOW UP PLANNED IN ONE YEAR.  [name] Lyme disease   . Obesity   . OSA (obstructive sleep apnea) 05/10/2017  . Sinusitis   . Stress disorder, acute   . Vitamin D deficiency     Patient Active Problem List   Diagnosis Date Noted  . Excessive sweating 01/09/2019  . OSA (obstructive sleep apnea) 05/10/2017  . Fatigue 04/05/2017  . Snoring 04/05/2017  .  Pre-syncope 03/10/2017  . Abnormal CT of the chest 08/31/2016  . Panic attacks 04/09/2016  . Major depression, recurrent ([place]) 05/08/2015  . Asthma, chronic obstructive, with acute exacerbation ([place]) 01/28/2015  . Essential hypertension 07/26/2014  . Chest pressure 07/26/2014  . Constipation - functional 11/20/2013  . Hypothyroidism 11/16/2013  . H/O ventral hernia repair 05/01/2012  . Pulmonary nodules 01/26/2012  . Overweight(278.02) 01/26/2012  . Prediabetes 03/04/2011  . Fatty liver disease, nonalcoholic [id number]  . Generalized anxiety disorder 10/21/2010  . VENTRAL HERNIA-twice recurrent 09/08/2009  . Vitamin D deficiency 12/27/2008  . Hyperlipidemia 05/22/2007  . Moderate persistent asthma 05/22/2007  . DEGENERATIVE DISC DISEASE 05/22/2007  . Fibromyalgia 05/22/2007  . Allergic rhinitis 04/14/2007  . GERD 04/14/2007    Past Surgical History:  Procedure Laterality Date  . [place]   x 2   . ENDOMETRIAL ABLATION  2009  . HERNIA REPAIR  04/14/12   ventral hernia repair  . Laparoscopic ventral hernia repair w/ mesh  08/2009   Dr. Grandville Silos, [place]  . LAPAROSCOPY  2002  . TUBAL LIGATION    . VENTRAL HERNIA REPAIR  09/2006   Dr. Grandville Silos  . VENTRAL HERNIA REPAIR N/A 04/14/2012   Procedure: Laparoscopic Repair of  Ventral Hernia;  Surgeon: Pedro Earls, MD;  Location: [facility];  Service: General;  Laterality: N/A;  Repair of Recurrent Ventral Hernia     OB History  No obstetric history on file.     Family History  Problem Relation Age of Onset  . Diabetes Mother   . Heart disease Mother 8  . Arthritis Mother   . Lung cancer Maternal Grandmother   . Cancer Maternal Grandmother        lung  . Colon cancer Neg Hx     Social History   Tobacco Use  . Smoking status: Former Smoker    Quit date: 03/01/2004    Years since quitting: 15.0  . Smokeless tobacco: Never Used  Substance Use Topics  . Alcohol use: No    Alcohol/week: 1.0 standard  drinks    Types: 1 Standard drinks or equivalent per week  . Drug use: No    Comment: remote [place] Medications Prior to Admission medications   Medication Sig Start Date End Date Taking? Authorizing Provider  albuterol (VENTOLIN HFA) 108 (90 Base) MCG/ACT inhaler INHALE 2 PUFFS INTO THE LUNGS EVERY 4 HOURS AS NEEDED FOR [facility] 12/14/18   Parrett, Tammy S, NP  escitalopram (LEXAPRO) 20 MG tablet TAKE 1 TABLET BY MOUTH EVERY DAY 06/27/18   Bedsole, [name] E, MD  estradiol (ESTRACE) 2 MG tablet Take 2 mg by mouth 2 (two) times daily. 04/26/18   [provider]  Fluticasone-Salmeterol (ADVAIR DISKUS) 100-50 MCG/DOSE AEPB Inhale 1 puff into the lungs 2 (two) times daily. 12/14/18   Parrett, Fonnie Mu, NP  hydrochlorothiazide (HYDRODIURIL) 25 MG tablet TAKE 1 TABLET BY MOUTH EVERY DAY (TAKE WITH LOSARTAN) 01/09/19   Bedsole, [name] E, MD  losartan (COZAAR) 100 MG tablet TAKE 1 TABLET BY MOUTH EVERY DAY (TAKE WITH HCTZ) 01/09/19   Bedsole, [name] E, MD  montelukast (SINGULAIR) 10 MG tablet Take 1 tablet (10 mg total) by mouth at bedtime. 12/14/18   Parrett, Fonnie Mu, NP  progesterone (PROMETRIUM) 100 MG capsule Take 1 tablet by mouth daily x 3 weeks then on stop on the fourth week.  repeat    [provider]  Thyroid (NATURE-THROID PO) Take 118 mg by mouth daily.    [provider]    Allergies    Progesterone, Codeine, and Cymbalta [duloxetine hcl]  Review of Systems   Review of Systems  Constitutional:       Per HPI, otherwise negative  HENT:       Per HPI, otherwise negative  Respiratory:       Per HPI, otherwise negative  Cardiovascular:       Per HPI, otherwise negative  Gastrointestinal: Negative for vomiting.  Endocrine:       Negative aside from HPI  Genitourinary:       Neg aside from HPI   Musculoskeletal:       Per HPI, otherwise negative  Skin: Positive for color change and wound.  Neurological: Positive for headaches. Negative for syncope and  weakness.    Physical Exam Updated Vital Signs BP (!) 189/103 (BP Location: Right Arm)   Pulse 85   Temp 98.5 F (36.9 C) (Oral)   Resp 18   SpO2 96%   Physical Exam Vitals and nursing note reviewed.  Constitutional:      General: She is not in acute distress.    Appearance: She is well-developed.  HENT:     Head: Normocephalic.   Eyes:     Conjunctiva/sclera: Conjunctivae normal.  Neck:   Cardiovascular:     Rate and Rhythm: Normal rate and regular rhythm.  Pulmonary:     Effort:  Pulmonary effort is normal. No respiratory distress.     Breath sounds: Normal breath sounds. No stridor.  Abdominal:     General: There is no distension.  Skin:    General: Skin is warm and dry.  Neurological:     Mental Status: She is alert and oriented to person, place, and time.     Cranial Nerves: No cranial nerve deficit.     Motor: No tremor or abnormal muscle tone.     Coordination: Coordination normal.     ED Results / Procedures / Treatments    Radiology CT Head Wo Contrast  Result Date: 03/08/2019 CLINICAL DATA:  Head trauma, headache. EXAM: CT HEAD WITHOUT CONTRAST CT MAXILLOFACIAL WITHOUT CONTRAST TECHNIQUE: Contiguous axial images were obtained from the base of the skull through the vertex without intravenous contrast. Multidetector CT imaging of the maxillofacial structures was performed. Multiplanar CT image reconstructions were also generated. A small metallic BB was placed on the right temple in order to reliably differentiate right from left. COMPARISON:  Head CT 02/10/2016 FINDINGS: CT HEAD FINDINGS Brain: No evidence of acute intracranial hemorrhage. No demarcated cortical infarction. No evidence of intracranial mass. No midline shift or extra-axial fluid collection. Redemonstrated bilateral basal ganglia mineralization. Cerebral volume is normal for age. Vascular: No hyperdense vessel. Skull: Normal. Negative for fracture or focal lesion. CT MAXILLOFACIAL FINDINGS Osseous:  Bilateral displaced nasal bone fractures. Additional probable minimally displaced fracture of the superior bony nasal septum (series 5, image 27). No other maxillofacial fracture is identified. Orbits: No acute orbital abnormality. Sinuses: Small mucous retention cysts within the bilateral maxillary sinuses. Mild ethmoid sinus mucosal thickening. No significant mastoid effusion. Soft tissues: Probable mild bilateral periorbital soft tissue swelling extending along the nasal bridge. IMPRESSION: CT head: No evidence of acute intracranial abnormality. CT maxillofacial: 1. Bilateral displaced nasal bone fractures. 2. Additional probable minimally displaced fracture of the superior bony nasal septum. 3. Bilateral maxillary sinus mucous retention cysts. Electronically Signed   By: Kellie Simmering DO   On: 03/08/2019 15:24   CT Cervical Spine Wo Contrast  Result Date: 03/08/2019 CLINICAL DATA:  Status post trauma. EXAM: CT CERVICAL SPINE WITHOUT CONTRAST TECHNIQUE: Multidetector CT imaging of the cervical spine was performed without intravenous contrast. Multiplanar CT image reconstructions were also generated. COMPARISON:  None. FINDINGS: Alignment: Normal. Skull base and vertebrae: No acute fracture. No primary bone lesion or focal pathologic process. Soft tissues and spinal canal: No prevertebral fluid or swelling. No visible canal hematoma. Disc levels: There is moderate severity narrowing of the anterior atlantoaxial articulation. C2-3: Normal endplates. Normal disc height and morphology. Normal bilateral uncovertebral and apophyseal joints. Normal central canal and intervertebral neuroforamina. C3-4: Normal endplates. Normal disc height and morphology. Normal bilateral uncovertebral and apophyseal joints. Normal central canal and intervertebral neuroforamina. C4-5: Normal endplates. Normal disc height and morphology. Normal bilateral uncovertebral and apophyseal joints. Normal central canal and intervertebral  neuroforamina. C5-6: Mild anterior osteophyte formation. Normal disc height and morphology. Normal bilateral uncovertebral and apophyseal joints. Normal central canal and intervertebral neuroforamina. C6-7: Normal endplates. Normal disc height and morphology. Normal bilateral uncovertebral and apophyseal joints. Normal central canal and intervertebral neuroforamina. C7-T1: Normal endplates. Normal disc height and morphology. Normal bilateral uncovertebral and apophyseal joints. Normal central canal and intervertebral neuroforamina. Upper chest: A 5 mm noncalcified lung nodule is seen within the posterior aspect of the right upper lobe. This is stable in appearance when compared to prior chest CT dated January 28, 2012. Other: None. IMPRESSION: 1.  No acute osseous abnormality of the cervical spine. 2. Moderate severity narrowing of the anterior atlantoaxial articulation. 3. Mild degenerative changes at the level of C5-C6. Electronically Signed   By: Virgina Norfolk M.D.   On: 03/08/2019 19:27   CT Maxillofacial Wo Contrast  Result Date: 03/08/2019 CLINICAL DATA:  Head trauma, headache. EXAM: CT HEAD WITHOUT CONTRAST CT MAXILLOFACIAL WITHOUT CONTRAST TECHNIQUE: Contiguous axial images were obtained from the base of the skull through the vertex without intravenous contrast. Multidetector CT imaging of the maxillofacial structures was performed. Multiplanar CT image reconstructions were also generated. A small metallic BB was placed on the right temple in order to reliably differentiate right from left. COMPARISON:  Head CT 02/10/2016 FINDINGS: CT HEAD FINDINGS Brain: No evidence of acute intracranial hemorrhage. No demarcated cortical infarction. No evidence of intracranial mass. No midline shift or extra-axial fluid collection. Redemonstrated bilateral basal ganglia mineralization. Cerebral volume is normal for age. Vascular: No hyperdense vessel. Skull: Normal. Negative for fracture or focal lesion. CT  MAXILLOFACIAL FINDINGS Osseous: Bilateral displaced nasal bone fractures. Additional probable minimally displaced fracture of the superior bony nasal septum (series 5, image 27). No other maxillofacial fracture is identified. Orbits: No acute orbital abnormality. Sinuses: Small mucous retention cysts within the bilateral maxillary sinuses. Mild ethmoid sinus mucosal thickening. No significant mastoid effusion. Soft tissues: Probable mild bilateral periorbital soft tissue swelling extending along the nasal bridge. IMPRESSION: CT head: No evidence of acute intracranial abnormality. CT maxillofacial: 1. Bilateral displaced nasal bone fractures. 2. Additional probable minimally displaced fracture of the superior bony nasal septum. 3. Bilateral maxillary sinus mucous retention cysts. Electronically Signed   By: Kellie Simmering DO   On: 03/08/2019 15:24    Procedures Procedures (including critical care time)  Medications Ordered in ED Medications  acetaminophen (TYLENOL) tablet 1,000 mg (1,000 mg Oral Given 03/08/19 1742)    ED Course  I have reviewed the triage vital signs and the nursing notes.  Pertinent labs & imaging results that were available during my care of the patient were reviewed by me and considered in my medical decision making (see chart for details).      MDM Rules/Calculators/A&P                     On repeat exam, CT is reviewed.  Now, patient has mild midline neck pain.   7:52 PM Patient in no distress, awake, alert. We reviewed all findings again including CT evidence demonstrating multiple nasal fractures, but otherwise no facial fractures, no intracranial hemorrhage, no skull fracture, no cervical spine fracture. With no neuro deficits on exam, no neuro complaints bad headache, patient is appropriate for discharge.  We discussed medications for relief from her multiple fractures, headache, the patient will continue ibuprofen, Tylenol and have tramadol. Patient will follow up  with ENT, primary care. Final Clinical Impression(s) / ED Diagnoses Final diagnoses:  Fall, initial encounter  Closed fracture of nasal bone, initial encounter  Concussion without loss of consciousness, initial encounter    Rx / DC Orders ED Discharge Orders         Ordered    traMADol (ULTRAM) 50 MG tablet  Every 6 hours PRN     03/08/19 2011           Carmin Muskrat, MD 03/08/19 2015

## 2019-03-08 NOTE — Telephone Encounter (Signed)
Agree with plan 

## 2019-03-08 NOTE — Telephone Encounter (Signed)
I spoke with pt and she [place] 02[place]27[place]19; pt fell face first; pt saw starts and then blacked out for short period (pt not sure how long out); pt has black eyes, had nose bleed at time of fall and pt thinks may have broken her nose; pt has had constant H/A since she fell on 02/27/19 from pain level of 3 - 6.pt has lightheadedness and unsteady on feet on and off since fall. When family first got pt up off ground when fell she had difficulty walking,legs wobbling and was dazed. Pt does not think has any vision changes since fall.Pt had taken losartan 100 mg and HCTZ 25 mg at 8 AM this morning and pt had appt earlier today and 9:40 BP 170/90 something and after 20' recked and BP 160/90 something. Pt will go to [facility] ED for eval and possible imaging. FYI to Dr Diona Browner.

## 2019-03-08 NOTE — ED Notes (Signed)
ED Provider at bedside. 

## 2019-03-08 NOTE — Discharge Instructions (Signed)
As discussed, you have been diagnosed with a concussion and fracture of the nasal bones. Recovery from these can take quite some time, and it is important that you monitor your condition carefully. Please follow-up with our ENT colleague as well as with your primary care physician for appropriate ongoing care.  Return here for concerning changes in your condition.

## 2019-03-11 IMAGING — DX DG CHEST 2V
2 series · 2 of 2 positions shown · non-contrast
Comparison: 08/28/2015 ; correlation CT chest 01/28/2012

CLINICAL DATA: Follow-up lung nodules, hypertension, asthma, former
smoker, GERD, fibromyalgia

EXAM:
CHEST  2 VIEW

[chest pa]
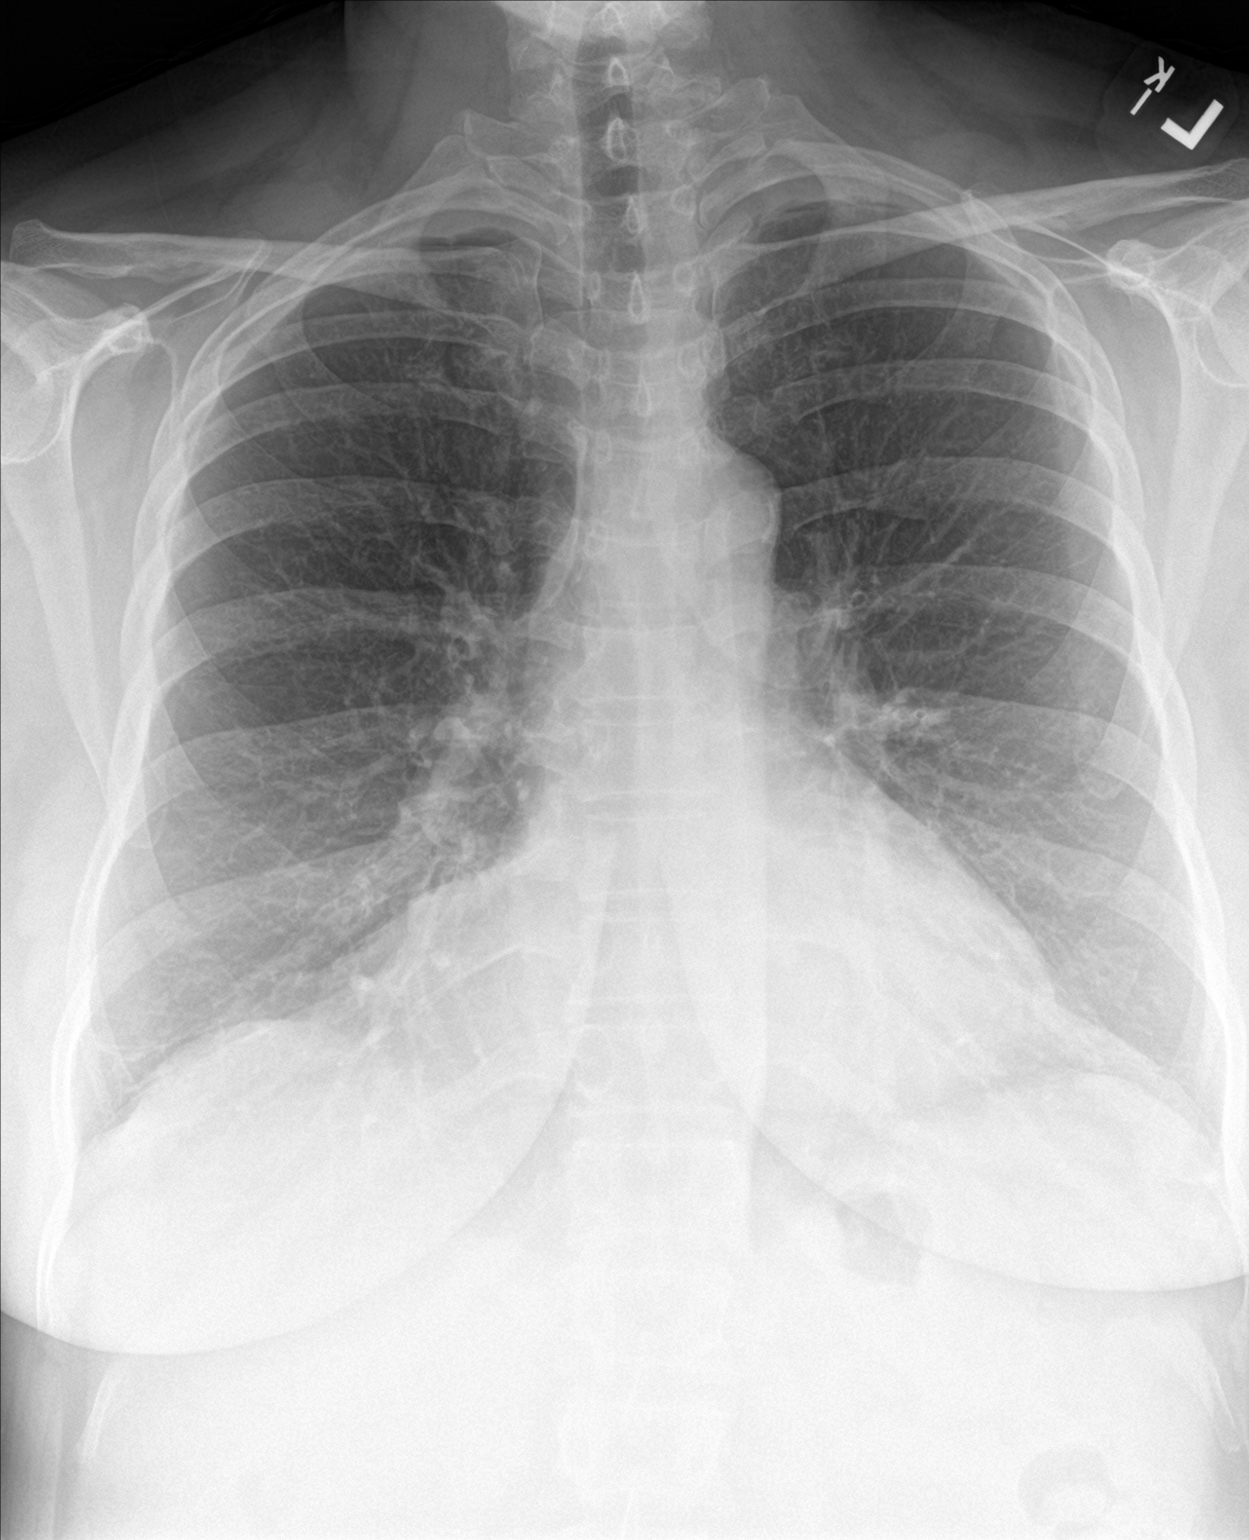

[chest lat]
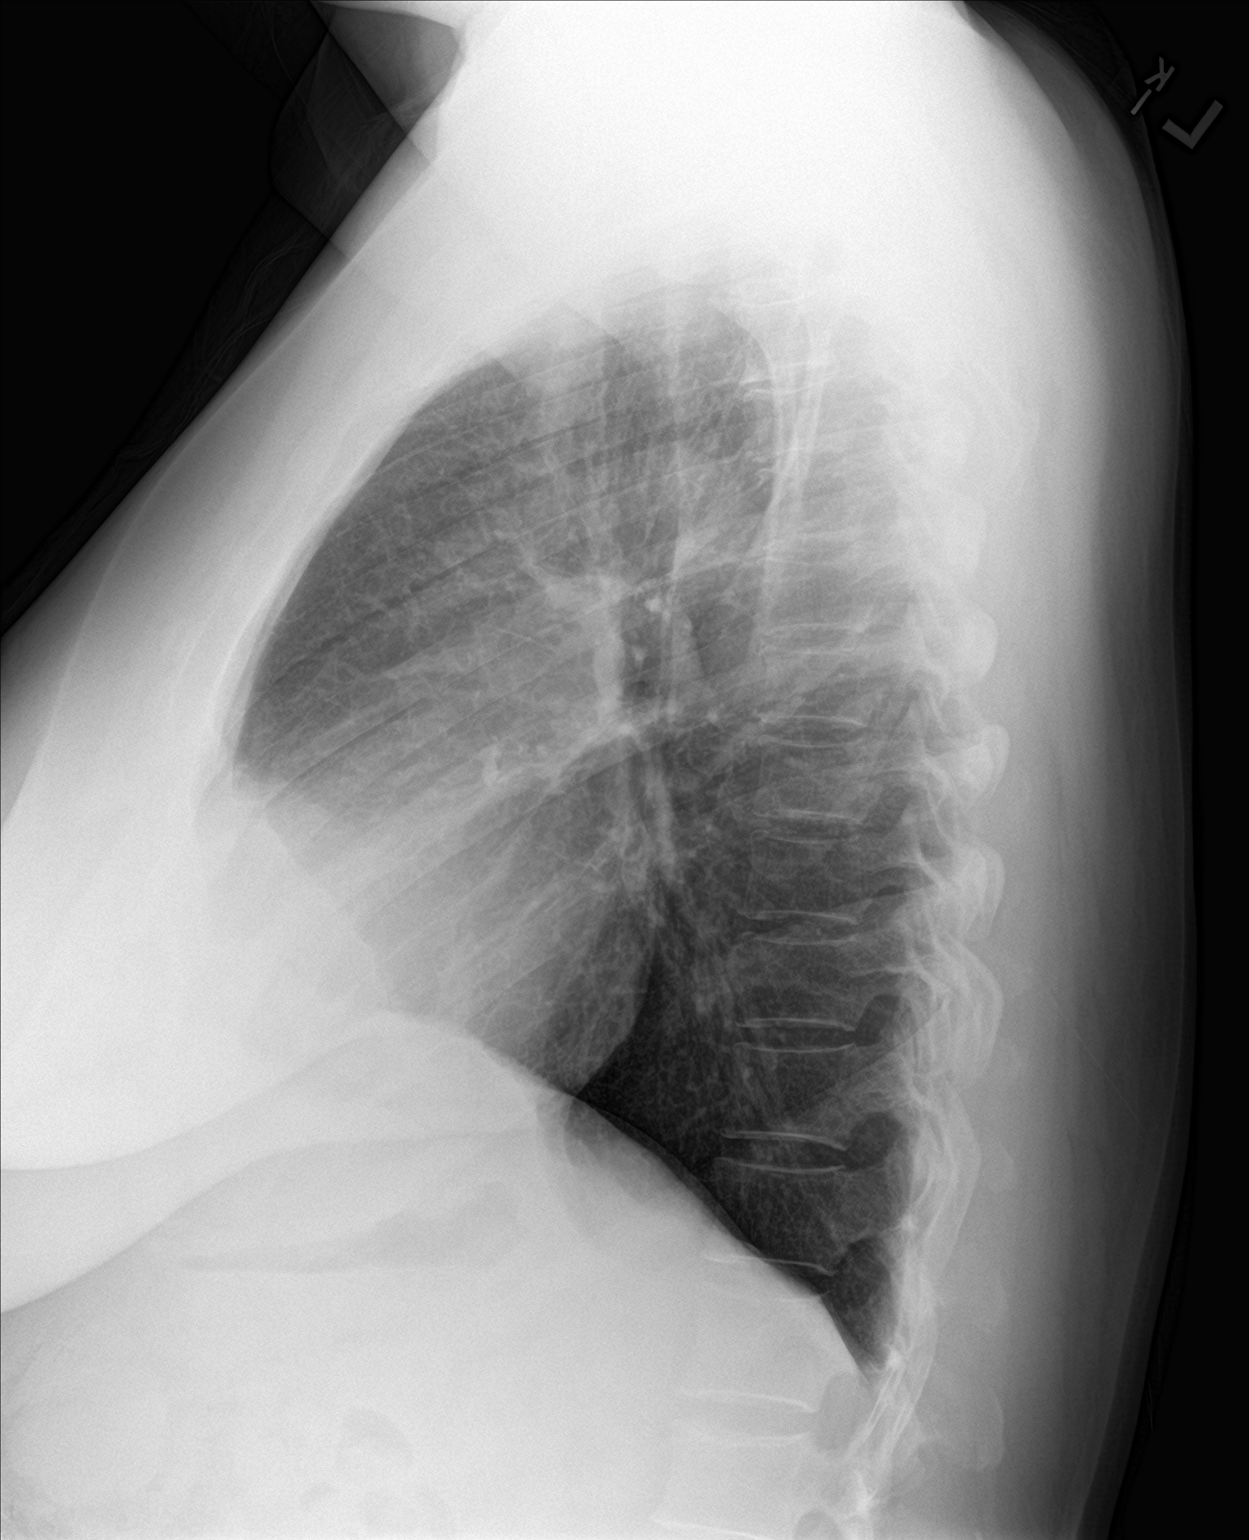

[2 of 2 positions shown; findings below may reference images not displayed]

FINDINGS: Normal heart size.

Large fat pad at RIGHT cardiophrenic angle unchanged.

Mediastinal contours and pulmonary vascularity otherwise normal.

Atherosclerotic calcification aorta.

Bronchitic changes with minimal subsegmental atelectasis at RIGHT
costophrenic angle.

No acute infiltrate, pleural effusion or pneumothorax.

Specifically no pulmonary nodules identified.

Osseous structures unremarkable.
IMPRESSION: Bronchitic changes with minimal subsegmental atelectasis at RIGHT
base.

Aortic Atherosclerosis (YNE2T-MLA.A).

## 2019-04-03 ENCOUNTER — Ambulatory Visit: Payer: Self-pay

## 2019-04-03 ENCOUNTER — Other Ambulatory Visit: Payer: Self-pay

## 2019-04-03 ENCOUNTER — Ambulatory Visit: Payer: BC Managed Care – PPO | Admitting: Orthopedic Surgery

## 2019-04-03 DIAGNOSIS — M7501 Adhesive capsulitis of right shoulder: Secondary | ICD-10-CM

## 2019-04-03 MED ORDER — MELOXICAM 15 MG PO TABS: 15.0000 mg | ORAL_TABLET | Freq: Every day | ORAL | 1 refills | Status: DC

## 2019-04-06 ENCOUNTER — Encounter: Payer: Self-pay | Admitting: Orthopedic Surgery

## 2019-04-06 NOTE — Progress Notes (Signed)
Office Visit Note   Patient: Jaime Chapman           Date of Birth: 04/23/1960           MRN: [id number] Visit Date: 04/03/2019 Requested by: Jinny Sanders, MD [place],  [place] [id number] PCP: Jinny Sanders, MD  Subjective: Chief Complaint  Patient presents with  . Right Shoulder - Pain    HPI: Jaime Chapman is a 59 y.o. female who presents to the office complaining of right shoulder pain.  Patient notes that she injured her right shoulder a few months ago while stretching.  She felt a pop.  She notes that has not improved over the past several months.  She is right-hand dominant and this pain wakes her up some nights.  Overall the pain is been ongoing for about 2 months.  She notes some weakness but states that pain is her main complaint rather than the weakness.  She has tried home exercise program without any relief.  She is unable to lay on her right side at night.  She is taking Aleve with little relief.  She localizes the majority of her pain to the anterior aspect of the right shoulder.  She has a history of prediabetes as well as hypothyroidism.  Denies any neck pain, numbness/tingling.  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                ROS:  All systems reviewed are negative as they relate to the chief complaint within the history of present illness.  Patient denies fevers or chills.  Assessment & Plan: Visit Diagnoses:  1. Adhesive capsulitis of right shoulder     Plan: Impression is likely early adhesive capsulitis in the right shoulder.  Mild restriction of passive range of motion is present.  Anterior pain within the rotator interval also noted.  Rotator cuff strength intact.  Plan is home exercise program of stretching along with intra-articular glenohumeral joint injection today.  Come back in 6 weeks for repeat clinical assessment and possible repeat injection and/or formal physical  therapy. Follow-Up Instructions: No follow-ups on file.   Orders:  Orders Placed This Encounter  Procedures  . XR Shoulder Right   Meds ordered this encounter  Medications  . meloxicam (MOBIC) 15 MG tablet    Sig: Take 1 tablet (15 mg total) by mouth daily.    Dispense:  30 tablet    Refill:  1      Procedures: No procedures performed   Clinical Data: No additional findings.  Objective: Vital Signs: There were no vitals taken for this visit.  Physical Exam:  Constitutional: Patient appears well-developed HEENT:  Head: Normocephalic Eyes:EOM are normal Neck: Normal range of motion Cardiovascular: Normal rate Pulmonary/chest: Effort normal Neurologic: Patient is alert Skin: Skin is warm Psychiatric: Patient has normal mood and affect  Ortho Exam:  Right shoulder Exam Patient lacks about 10 degrees of forward flexion on the right compared to the left.  External rotation at 15 degrees of abduction on the right also about 15 degrees less compared to the left-hand side.  Isolated glenohumeral abduction is 110 on the left and about 85 on the right. Decreased internal rotation when compared with the contralateral side No TTP over the [facility] joint or bicipital groove 5/5 motor strength of the subscapularis, mid, infraspinatus muscle Negative Hawkins impingement 5/5 grip strength, forearm pronation/supination, and  bicep strength  Specialty Comments:  No specialty comments available.  Imaging: No results found.   PMFS History: Patient Active Problem List   Diagnosis Date Noted  . Excessive sweating 01/09/2019  . OSA (obstructive sleep apnea) 05/10/2017  . Fatigue 04/05/2017  . Snoring 04/05/2017  . Pre-syncope 03/10/2017  . Abnormal CT of the chest 08/31/2016  . Panic attacks 04/09/2016  . Major depression, recurrent ([place]) 05/08/2015  . Asthma, chronic obstructive, with acute exacerbation ([place]) 01/28/2015  . Essential hypertension 07/26/2014  . Chest pressure  07/26/2014  . Constipation - functional 11/20/2013  . Hypothyroidism 11/16/2013  . H/O ventral hernia repair 05/01/2012  . Pulmonary nodules 01/26/2012  . Overweight(278.02) 01/26/2012  . Prediabetes 03/04/2011  . Fatty liver disease, nonalcoholic A999333  . Generalized anxiety disorder 10/21/2010  . VENTRAL HERNIA-twice recurrent 09/08/2009  . Vitamin D deficiency 12/27/2008  . Hyperlipidemia 05/22/2007  . Moderate persistent asthma 05/22/2007  . DEGENERATIVE DISC DISEASE 05/22/2007  . Fibromyalgia 05/22/2007  . Allergic rhinitis 04/14/2007  . GERD 04/14/2007   Past Medical History:  Diagnosis Date  . Allergic rhinitis   . Anxiety   . Asthma   . DDD (degenerative disc disease)    also has spinal arthritis  . Depression   . Diabetes mellitus    borderline  . Dyslipidemia   . Fibromyalgia   . GERD (gastroesophageal reflux disease)   . Lung nodules    RIGHT LUNG--LAST EVALUATED BY CHEST CT 02/01/12 - STABLE AND FOLLOW UP PLANNED IN ONE YEAR.  [name] Lyme disease   . Obesity   . OSA (obstructive sleep apnea) 05/10/2017  . Sinusitis   . Stress disorder, acute   . Vitamin D deficiency     Family History  Problem Relation Age of Onset  . Diabetes Mother   . Heart disease Mother 30  . Arthritis Mother   . Lung cancer Maternal Grandmother   . Cancer Maternal Grandmother        lung  . Colon cancer Neg Hx     Past Surgical History:  Procedure Laterality Date  . [place]   x 2   . ENDOMETRIAL ABLATION  2009  . HERNIA REPAIR  04/14/12   ventral hernia repair  . Laparoscopic ventral hernia repair w/ mesh  08/2009   Dr. Grandville Silos, [place]  . LAPAROSCOPY  2002  . TUBAL LIGATION    . VENTRAL HERNIA REPAIR  09/2006   Dr. Grandville Silos  . VENTRAL HERNIA REPAIR N/A 04/14/2012   Procedure: Laparoscopic Repair of  Ventral Hernia;  Surgeon: Pedro Earls, MD;  Location: [facility];  Service: General;  Laterality: N/A;  Repair of Recurrent Ventral Hernia   Social History    Occupational History  . Occupation: Corporate treasurer  Tobacco Use  . Smoking status: Former Smoker    Quit date: 03/01/2004    Years since quitting: 15.1  . Smokeless tobacco: Never Used  Substance and Sexual Activity  . Alcohol use: No    Alcohol/week: 1.0 standard drinks    Types: 1 Standard drinks or equivalent per week  . Drug use: No    Comment: remote [place]  . Sexual activity: Yes    Partners: Male

## 2019-04-08 ENCOUNTER — Encounter: Payer: Self-pay | Admitting: Orthopedic Surgery

## 2019-04-19 ENCOUNTER — Other Ambulatory Visit: Payer: Self-pay | Admitting: Family Medicine

## 2019-04-19 NOTE — Telephone Encounter (Signed)
Last filled 05/2018 90 day supply with 1 refill... as of 2/252/2021, pt will be due for CPE... will notify pt but please advise if okay to refill

## 2019-05-16 ENCOUNTER — Ambulatory Visit: Payer: BC Managed Care – PPO | Admitting: Orthopedic Surgery

## 2019-05-23 ENCOUNTER — Other Ambulatory Visit: Payer: Self-pay

## 2019-05-23 ENCOUNTER — Ambulatory Visit: Payer: BC Managed Care – PPO | Admitting: Orthopedic Surgery

## 2019-05-23 DIAGNOSIS — M7501 Adhesive capsulitis of right shoulder: Secondary | ICD-10-CM | POA: Diagnosis not present

## 2019-05-25 ENCOUNTER — Encounter: Payer: Self-pay | Admitting: Orthopedic Surgery

## 2019-05-25 ENCOUNTER — Ambulatory Visit: Payer: BC Managed Care – PPO

## 2019-05-25 NOTE — Progress Notes (Signed)
Office Visit Note   Patient: Jaime Chapman           Date of Birth: 03-Jul-1960           MRN: [id number] Visit Date: 05/23/2019 Requested by: Jinny Sanders, MD [place],  [place] [id number] PCP: Jinny Sanders, MD  Subjective: Chief Complaint  Patient presents with  . Right Shoulder - Follow-up    HPI: Holland Commons is a patient with right frozen shoulder.  She is doing better.  Range of motion is improved.  Injection did help her for the first several days after she got over the pain.  She has been doing home exercise program 2-3 times a day.  It has been 6 weeks since she was last seen.  Overall she can live with her current level of symptoms.              ROS: All systems reviewed are negative as they relate to the chief complaint within the history of present illness.  Patient denies  fevers or chills.   Assessment & Plan: Visit Diagnoses:  1. Adhesive capsulitis of right shoulder     Plan: Impression is significant improvement in right shoulder motion and symptoms following injection for early adhesive capsulitis.  No need for further intervention at this time.  Decision point today was for or against further injection.  Overall I think she is doing well with her home exercise program.  Follow-up with [place] as needed.  Follow-Up Instructions: Return if symptoms worsen or fail to improve.   Orders:  No orders of the defined types were placed in this encounter.  No orders of the defined types were placed in this encounter.     Procedures: No procedures performed   Clinical Data: No additional findings.  Objective: Vital Signs: There were no vitals taken for this visit.  Physical Exam:   Constitutional: Patient appears well-developed HEENT:  Head: Normocephalic Eyes:EOM are normal Neck: Normal range of motion Cardiovascular: Normal rate Pulmonary/chest: Effort normal Neurologic: Patient is alert Skin: Skin is warm Psychiatric: Patient has  normal mood and affect    Ortho Exam: Ortho exam demonstrates full active and passive range of motion of the left shoulder.  Right shoulder has pretty symmetric external rotation 15 degrees of abduction to within 5 degrees of the left-hand side to about 50 degrees.  Isolated glenohumeral forward flexion elevation also within 5 degrees of the contralateral left arm.  Strength is good to rotator cuff testing.  No coarse grinding or crepitus is present.  Specialty Comments:  No specialty comments available.  Imaging: No results found.   PMFS History: Patient Active Problem List   Diagnosis Date Noted  . Excessive sweating 01/09/2019  . OSA (obstructive sleep apnea) 05/10/2017  . Fatigue 04/05/2017  . Snoring 04/05/2017  . Pre-syncope 03/10/2017  . Abnormal CT of the chest 08/31/2016  . Panic attacks 04/09/2016  . Major depression, recurrent ([place]) 05/08/2015  . Asthma, chronic obstructive, with acute exacerbation ([place]) 01/28/2015  . Essential hypertension 07/26/2014  . Chest pressure 07/26/2014  . Constipation - functional 11/20/2013  . Hypothyroidism 11/16/2013  . H/O ventral hernia repair 05/01/2012  . Pulmonary nodules 01/26/2012  . Overweight(278.02) 01/26/2012  . Prediabetes 03/04/2011  . Fatty liver disease, nonalcoholic [id number]  . Generalized anxiety disorder 10/21/2010  . VENTRAL HERNIA-twice recurrent 09/08/2009  . Vitamin D deficiency 12/27/2008  . Hyperlipidemia 05/22/2007  . Moderate persistent asthma 05/22/2007  . DEGENERATIVE DISC DISEASE  05/22/2007  . Fibromyalgia 05/22/2007  . Allergic rhinitis 04/14/2007  . GERD 04/14/2007   Past Medical History:  Diagnosis Date  . Allergic rhinitis   . Anxiety   . Asthma   . DDD (degenerative disc disease)    also has spinal arthritis  . Depression   . Diabetes mellitus    borderline  . Dyslipidemia   . Fibromyalgia   . GERD (gastroesophageal reflux disease)   . Lung nodules    RIGHT LUNG--LAST EVALUATED BY  CHEST CT 02/01/12 - STABLE AND FOLLOW UP PLANNED IN ONE YEAR.  [name] Lyme disease   . Obesity   . OSA (obstructive sleep apnea) 05/10/2017  . Sinusitis   . Stress disorder, acute   . Vitamin D deficiency     Family History  Problem Relation Age of Onset  . Diabetes Mother   . Heart disease Mother 94  . Arthritis Mother   . Lung cancer Maternal Grandmother   . Cancer Maternal Grandmother        lung  . Colon cancer Neg Hx     Past Surgical History:  Procedure Laterality Date  . [place]   x 2   . ENDOMETRIAL ABLATION  2009  . HERNIA REPAIR  04/14/12   ventral hernia repair  . Laparoscopic ventral hernia repair w/ mesh  08/2009   Dr. Grandville Silos, [place]  . LAPAROSCOPY  2002  . TUBAL LIGATION    . VENTRAL HERNIA REPAIR  09/2006   Dr. Grandville Silos  . VENTRAL HERNIA REPAIR N/A 04/14/2012   Procedure: Laparoscopic Repair of  Ventral Hernia;  Surgeon: Pedro Earls, MD;  Location: [facility];  Service: General;  Laterality: N/A;  Repair of Recurrent Ventral Hernia   Social History   Occupational History  . Occupation: Corporate treasurer  Tobacco Use  . Smoking status: Former Smoker    Quit date: 03/01/2004    Years since quitting: 15.2  . Smokeless tobacco: Never Used  Substance and Sexual Activity  . Alcohol use: No    Alcohol/week: 1.0 standard drinks    Types: 1 Standard drinks or equivalent per week  . Drug use: No    Comment: remote majijuana  . Sexual activity: Yes    Partners: Male

## 2019-06-19 ENCOUNTER — Ambulatory Visit: Payer: Self-pay

## 2019-07-06 ENCOUNTER — Ambulatory Visit: Payer: Self-pay

## 2019-07-06 ENCOUNTER — Ambulatory Visit: Payer: BC Managed Care – PPO | Admitting: Surgical

## 2019-07-06 ENCOUNTER — Other Ambulatory Visit: Payer: Self-pay

## 2019-07-06 DIAGNOSIS — M7501 Adhesive capsulitis of right shoulder: Secondary | ICD-10-CM | POA: Diagnosis not present

## 2019-07-06 DIAGNOSIS — M546 Pain in thoracic spine: Secondary | ICD-10-CM | POA: Diagnosis not present

## 2019-07-06 DIAGNOSIS — M549 Dorsalgia, unspecified: Secondary | ICD-10-CM | POA: Diagnosis not present

## 2019-07-06 DIAGNOSIS — M25511 Pain in right shoulder: Secondary | ICD-10-CM

## 2019-07-06 MED ORDER — PREDNISONE 5 MG (21) PO TBPK: ORAL_TABLET | ORAL | 0 refills | Status: DC

## 2019-07-07 ENCOUNTER — Encounter: Payer: Self-pay | Admitting: Surgical

## 2019-07-07 NOTE — Progress Notes (Signed)
Office Visit Note   Patient: Jaime Chapman           Date of Birth: 05-Aug-1960           MRN: [id number] Visit Date: 07/06/2019 Requested by: Jinny Sanders, MD [place],  [place] [id number] PCP: Jinny Sanders, MD  Subjective: Chief Complaint  Patient presents with  . Right Shoulder - Follow-up    HPI: Jaime Chapman is a 59 y.o. female who presents to the office complaining of right shoulder pain.  Patient has previously been seen for right frozen shoulder.  She notes that after initially improving for several weeks, her right shoulder pain has returned and become much worse than before.  Her previous right shoulder injection provided a couple weeks of relief.  Her pain is waking her up at night.  She denies any significant weakness and only admits to mild loss of range of motion.  She does note some neck pain with radiation to the right shoulder blade as well as axial thoracic spine pain.  She denies any radiation of pain from the back around to her chest.  Reports numbness and tingling in all 5 fingers of the right hand.  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  All this pain is been ongoing in the last 2 weeks..                ROS:  All systems reviewed are negative as they relate to the chief complaint within the history of present illness.  Patient denies fevers or chills.  Assessment & Plan: Visit Diagnoses:  1. Upper back pain   2. Acute pain of right shoulder   3. Adhesive capsulitis of right shoulder   4. Pain in thoracic spine     Plan: Patient is a 59 year old female presents complaining of right shoulder pain.  She has a history of right shoulder adhesive capsulitis.  On exam she has very mild loss of range of motion compared with the contralateral arm.  She has significant pain with palpation of the bicipital groove and the [facility] joint as well as moderate pain  with passive range of motion of the shoulder.  She has good strength with rotator cuff but pain is worse when the supraspinatus is stressed.  Ordered MRI arthrogram of the right shoulder to evaluate SLAP tear versus rotator cuff tear.  Additionally, thoracic spine x-rays and cervical spine x-rays revealed mild degenerative changes in the disc spaces of the thoracic spine and inferior cervical spine.  Prescribed a steroid Dosepak to help with patient's back and neck pain.  She will follow-up after MRI to review results of the right shoulder MRI.  At that time we will reexamine her neck and back pain.  Patient agreed with this plan.  Follow-Up Instructions: No follow-ups on file.   Orders:  Orders Placed This Encounter  Procedures  . XR Cervical Spine 2 or 3 views  . XR Thoracic Spine 2 View  . MR SHOULDER RIGHT W CONTRAST  . Arthrogram   Meds ordered this encounter  Medications  . predniSONE (STERAPRED UNI-PAK 21 TAB) 5 MG (21) TBPK tablet    Sig: Take dosepak as directed    Dispense:  21 tablet    Refill:  0      Procedures: No procedures performed   Clinical Data: No additional findings.  Objective: Vital Signs: There were no vitals taken for this visit.  Physical Exam:  Constitutional: Patient appears well-developed HEENT:  Head: Normocephalic Eyes:EOM are normal Neck: Normal range of motion Cardiovascular: Normal rate Pulmonary/chest: Effort normal Neurologic: Patient is alert Skin: Skin is warm Psychiatric: Patient has normal mood and affect  Ortho Exam:  Right shoulder Exam 10 degree loss of forward flexion and abduction of the right shoulder compared to the contralateral side.  Significant pain with passive range of motion of the right shoulder.  No crepitus is felt with passive range of motion. 5 degree loss of external rotation compared with the contralateral side Mild tenderness to palpation over the [facility] joint.  Moderate tenderness to palpation over the  bicipital groove.  Positive O'Brien's test. Good subscapularis, supraspinatus, and infraspinatus strength.  Pain worse with supraspinatus resistance testing 5/5 grip strength, forearm pronation/supination, and bicep strength  Tender to palpation throughout the axial thoracic spine and inferior cervical spine.  Pain worse over the right paraspinal musculature of the T-spine and C-spine.  No evidence of hypo or hyperreflexia.  Sensation intact through all dermatomes of bilateral upper extremities  Specialty Comments:  No specialty comments available.  Imaging: No results found.   PMFS History: Patient Active Problem List   Diagnosis Date Noted  . Excessive sweating 01/09/2019  . OSA (obstructive sleep apnea) 05/10/2017  . Fatigue 04/05/2017  . Snoring 04/05/2017  . Pre-syncope 03/10/2017  . Abnormal CT of the chest 08/31/2016  . Panic attacks 04/09/2016  . Major depression, recurrent ([place]) 05/08/2015  . Asthma, chronic obstructive, with acute exacerbation ([place]) 01/28/2015  . Essential hypertension 07/26/2014  . Chest pressure 07/26/2014  . Constipation - functional 11/20/2013  . Hypothyroidism 11/16/2013  . H/O ventral hernia repair 05/01/2012  . Pulmonary nodules 01/26/2012  . Overweight(278.02) 01/26/2012  . Prediabetes 03/04/2011  . Fatty liver disease, nonalcoholic A999333  . Generalized anxiety disorder 10/21/2010  . VENTRAL HERNIA-twice recurrent 09/08/2009  . Vitamin D deficiency 12/27/2008  . Hyperlipidemia 05/22/2007  . Moderate persistent asthma 05/22/2007  . DEGENERATIVE DISC DISEASE 05/22/2007  . Fibromyalgia 05/22/2007  . Allergic rhinitis 04/14/2007  . GERD 04/14/2007   Past Medical History:  Diagnosis Date  . Allergic rhinitis   . Anxiety   . Asthma   . DDD (degenerative disc disease)    also has spinal arthritis  . Depression   . Diabetes mellitus    borderline  . Dyslipidemia   . Fibromyalgia   . GERD (gastroesophageal reflux disease)   .  Lung nodules    RIGHT LUNG--LAST EVALUATED BY CHEST CT 02/01/12 - STABLE AND FOLLOW UP PLANNED IN ONE YEAR.  [name] Lyme disease   . Obesity   . OSA (obstructive sleep apnea) 05/10/2017  . Sinusitis   . Stress disorder, acute   . Vitamin D deficiency     Family History  Problem Relation Age of Onset  . Diabetes Mother   . Heart disease Mother 83  . Arthritis Mother   . Lung cancer Maternal Grandmother   . Cancer Maternal Grandmother        lung  . Colon cancer Neg Hx     Past Surgical History:  Procedure Laterality Date  . [place]   x 2   . ENDOMETRIAL ABLATION  2009  . HERNIA REPAIR  04/14/12   ventral hernia repair  . Laparoscopic ventral hernia repair w/ mesh  08/2009   Dr. Grandville Silos, [place]  . LAPAROSCOPY  2002  .  TUBAL LIGATION    . VENTRAL HERNIA REPAIR  09/2006   Dr. Grandville Silos  . VENTRAL HERNIA REPAIR N/A 04/14/2012   Procedure: Laparoscopic Repair of  Ventral Hernia;  Surgeon: Pedro Earls, MD;  Location: [facility];  Service: General;  Laterality: N/A;  Repair of Recurrent Ventral Hernia   Social History   Occupational History  . Occupation: Corporate treasurer  Tobacco Use  . Smoking status: Former Smoker    Quit date: 03/01/2004    Years since quitting: 15.3  . Smokeless tobacco: Never Used  Substance and Sexual Activity  . Alcohol use: No    Alcohol/week: 1.0 standard drinks    Types: 1 Standard drinks or equivalent per week  . Drug use: No    Comment: remote majijuana  . Sexual activity: Yes    Partners: Male

## 2019-07-22 ENCOUNTER — Other Ambulatory Visit: Payer: Self-pay | Admitting: Family Medicine

## 2019-07-23 NOTE — Telephone Encounter (Signed)
Please call and schedule CPE with fasting labs.  Needs to schedule appointment to get refills on her medications.  Please send back to me once scheduled and I will refill her medication to get her to that appointment.

## 2019-08-01 DIAGNOSIS — Z1231 Encounter for screening mammogram for malignant neoplasm of breast: Secondary | ICD-10-CM | POA: Diagnosis not present

## 2019-08-01 DIAGNOSIS — Z01419 Encounter for gynecological examination (general) (routine) without abnormal findings: Secondary | ICD-10-CM | POA: Diagnosis not present

## 2019-08-01 DIAGNOSIS — Z6837 Body mass index (BMI) 37.0-37.9, adult: Secondary | ICD-10-CM | POA: Diagnosis not present

## 2019-08-06 DIAGNOSIS — E039 Hypothyroidism, unspecified: Secondary | ICD-10-CM | POA: Diagnosis not present

## 2019-08-06 DIAGNOSIS — E559 Vitamin D deficiency, unspecified: Secondary | ICD-10-CM | POA: Diagnosis not present

## 2019-08-06 DIAGNOSIS — A6920 Lyme disease, unspecified: Secondary | ICD-10-CM | POA: Diagnosis not present

## 2019-08-06 DIAGNOSIS — G4700 Insomnia, unspecified: Secondary | ICD-10-CM | POA: Diagnosis not present

## 2019-08-06 DIAGNOSIS — N951 Menopausal and female climacteric states: Secondary | ICD-10-CM | POA: Diagnosis not present

## 2019-08-07 ENCOUNTER — Ambulatory Visit
Admission: RE | Admit: 2019-08-07 | Discharge: 2019-08-07 | Disposition: A | Discharge status: Home / Self Care | Payer: BC Managed Care – PPO | Source: Ambulatory Visit | Attending: Surgical | Admitting: Surgical

## 2019-08-07 ENCOUNTER — Other Ambulatory Visit: Payer: Self-pay

## 2019-08-07 DIAGNOSIS — M19011 Primary osteoarthritis, right shoulder: Secondary | ICD-10-CM | POA: Diagnosis not present

## 2019-08-07 DIAGNOSIS — M549 Dorsalgia, unspecified: Secondary | ICD-10-CM

## 2019-08-07 DIAGNOSIS — M25511 Pain in right shoulder: Secondary | ICD-10-CM | POA: Diagnosis not present

## 2019-08-07 MED ORDER — IOPAMIDOL (ISOVUE-M 200) INJECTION 41%
12.0000 mL | Freq: Once | INTRAMUSCULAR | Status: AC
  Administered 2019-08-07: 12 mL via INTRA_ARTICULAR

## 2019-08-15 NOTE — Telephone Encounter (Signed)
Called patient to schedule. She states she will call back to schedule.

## 2019-08-21 ENCOUNTER — Other Ambulatory Visit: Payer: Self-pay | Admitting: Family Medicine

## 2019-08-21 MED ORDER — ESCITALOPRAM OXALATE 20 MG PO TABS: 20.0000 mg | ORAL_TABLET | Freq: Every day | ORAL | 0 refills | Status: DC

## 2019-08-21 NOTE — Telephone Encounter (Signed)
Patient called back.  She scheduled her CPE/Labs

## 2019-08-21 NOTE — Addendum Note (Signed)
Addended by: Carter Kitten on: 08/21/2019 04:25 PM   Modules accepted: Orders

## 2019-08-27 ENCOUNTER — Ambulatory Visit (INDEPENDENT_AMBULATORY_CARE_PROVIDER_SITE_OTHER): Payer: BC Managed Care – PPO | Admitting: Orthopedic Surgery

## 2019-08-27 DIAGNOSIS — M5412 Radiculopathy, cervical region: Secondary | ICD-10-CM | POA: Diagnosis not present

## 2019-08-27 DIAGNOSIS — M546 Pain in thoracic spine: Secondary | ICD-10-CM
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
## 2019-09-04 ENCOUNTER — Telehealth: Payer: Self-pay | Admitting: Family Medicine

## 2019-09-04 ENCOUNTER — Encounter: Payer: Self-pay | Admitting: Orthopedic Surgery

## 2019-09-04 ENCOUNTER — Other Ambulatory Visit (INDEPENDENT_AMBULATORY_CARE_PROVIDER_SITE_OTHER): Payer: BC Managed Care – PPO

## 2019-09-04 ENCOUNTER — Other Ambulatory Visit: Payer: Self-pay

## 2019-09-04 DIAGNOSIS — E559 Vitamin D deficiency, unspecified: Secondary | ICD-10-CM

## 2019-09-04 DIAGNOSIS — E039 Hypothyroidism, unspecified: Secondary | ICD-10-CM

## 2019-09-04 DIAGNOSIS — E782 Mixed hyperlipidemia: Secondary | ICD-10-CM

## 2019-09-04 DIAGNOSIS — R7303 Prediabetes: Secondary | ICD-10-CM

## 2019-09-04 LAB — LDL CHOLESTEROL, DIRECT: Direct LDL: 123 mg/dL

## 2019-09-04 LAB — COMPREHENSIVE METABOLIC PANEL
ALT: 19 U/L (ref 0–35)
AST: 16 U/L (ref 0–37)
Albumin: 4.5 g/dL (ref 3.5–5.2)
Alkaline Phosphatase: 62 U/L (ref 39–117)
BUN: 30 mg/dL — ABNORMAL HIGH (ref 6–23)
CO2: 30 mEq/L (ref 19–32)
Calcium: 9.6 mg/dL (ref 8.4–10.5)
Chloride: 102 mEq/L (ref 96–112)
Creatinine, Ser: 1.24 mg/dL — ABNORMAL HIGH (ref 0.40–1.20)
GFR: 44.39 mL/min — ABNORMAL LOW (ref >60.00)
Glucose, Bld: 127 mg/dL — ABNORMAL HIGH (ref 70–99)
Potassium: 3.8 mEq/L (ref 3.5–5.1)
Sodium: 142 mEq/L (ref 135–145)
Total Bilirubin: 0.3 mg/dL (ref 0.2–1.2)
Total Protein: 6.9 g/dL (ref 6.0–8.3)

## 2019-09-04 LAB — LIPID PANEL
Cholesterol: 198 mg/dL (ref 0–200)
HDL: 43.5 mg/dL (ref >39.00)
NonHDL: 154.82
Total CHOL/HDL Ratio: 5
Triglycerides: 205 mg/dL — ABNORMAL HIGH (ref 0.0–149.0)
VLDL: 41 mg/dL — ABNORMAL HIGH (ref 0.0–40.0)

## 2019-09-04 LAB — HEMOGLOBIN A1C: Hgb A1c MFr Bld: 6.3 % (ref 4.6–6.5)

## 2019-09-04 LAB — T3, FREE: T3, Free: 3 pg/mL (ref 2.3–4.2)

## 2019-09-04 LAB — VITAMIN D 25 HYDROXY (VIT D DEFICIENCY, FRACTURES): VITD: 29.82 ng/mL — ABNORMAL LOW (ref 30.00–100.00)

## 2019-09-04 LAB — T4, FREE: Free T4: 0.67 ng/dL (ref 0.60–1.60)

## 2019-09-04 LAB — TSH: TSH: 2.38 u[IU]/mL (ref 0.35–4.50)

## 2019-09-04 NOTE — Telephone Encounter (Signed)
-----   Message from Cloyd Stagers, RT sent at 08/22/2019 11:17 AM EDT ----- Regarding: Lab Orders for Tuesday 7.6.2021 Please place lab orders for Tuesday 7.6.2021, office visit for physical on Friday 7.16.2021 Thank you, Dyke Maes RT(R)

## 2019-09-14 ENCOUNTER — Ambulatory Visit (INDEPENDENT_AMBULATORY_CARE_PROVIDER_SITE_OTHER): Payer: BC Managed Care – PPO | Admitting: Family Medicine

## 2019-09-14 VITALS — BP 138/80 | HR 68 | Temp 97.6°F | Ht 64.5 in | Wt 224.5 lb

## 2019-09-14 DIAGNOSIS — E782 Mixed hyperlipidemia: Secondary | ICD-10-CM | POA: Diagnosis not present

## 2019-09-14 DIAGNOSIS — Z0000 Encounter for general adult medical examination without abnormal findings: Secondary | ICD-10-CM | POA: Diagnosis not present

## 2019-09-14 DIAGNOSIS — E6609 Other obesity due to excess calories: Secondary | ICD-10-CM | POA: Insufficient documentation

## 2019-09-14 DIAGNOSIS — I10 Essential (primary) hypertension: Secondary | ICD-10-CM

## 2019-09-14 DIAGNOSIS — Z6837 Body mass index (BMI) 37.0-37.9, adult: Secondary | ICD-10-CM

## 2019-09-14 DIAGNOSIS — E661 Drug-induced obesity: Secondary | ICD-10-CM | POA: Insufficient documentation

## 2019-09-14 MED ORDER — TRULICITY 0.75 MG/0.5ML ~~LOC~~ SOAJ
0.7500 mg | SUBCUTANEOUS | Status: DC
Start: 2019-09-14 — End: 2019-10-16

## 2019-09-14 NOTE — Assessment & Plan Note (Signed)
Well controlled. Continue current medication.  

## 2019-09-14 NOTE — Assessment & Plan Note (Signed)
Counseled on lifestlye change.  refused nutritions referral.  Will start tial of Trulicity low dose weekly.. follow up in 2-4 weeks for likely titration of medication.

## 2019-09-14 NOTE — Progress Notes (Signed)
Chief Complaint  Patient presents with  . Annual Exam    History of Present Illness: HPI   The patient is here for annual wellness exam and preventative care.     prediabetes   Worsened control in last year. Lab Results  Component Value Date   HGBA1C 6.3 09/04/2019     Obesity: She struggles with exercsie and calorie restriction. She has noted no weight loss.  Body mass index is 37.94 kg/m.  Wt Readings from Last 3 Encounters:  09/14/19 224 lb 8 oz (101.8 kg)  01/09/19 220 lb 4 oz (99.9 kg)  12/14/18 217 lb 12.8 oz (98.8 kg)    Elevated Cholesterol: Worsened control  Lab Results  Component Value Date   CHOL 198 09/04/2019   HDL 43.50 09/04/2019   LDLCALC 132 (H) 04/20/2018   LDLDIRECT 123.0 09/04/2019   TRIG 205.0 (H) 09/04/2019   CHOLHDL 5 09/04/2019    Using medications without problems: Muscle aches:  Diet compliance: poor Exercise: minimal Other complaints: The 10-year ASCVD risk score Mikey Bussing DC Brooke Bonito., et al., 2013) is: 5%   Values used to calculate the score:     Age: 59 years     Sex: Female     Is Non-Hispanic African American: No     Diabetic: No     Tobacco smoker: No     Systolic Blood Pressure: 778 mmHg     Is BP treated: Yes     HDL Cholesterol: 43.5 mg/dL     Total Cholesterol: 198 mg/dL  Hypertension:  At goal on losartana nd HCTZ. BP Readings from Last 3 Encounters:  09/14/19 138/80  03/08/19 (!) 155/90  01/09/19 (!) 150/90     Using medication without problems or lightheadedness: none Chest pain with exertion: none Edema: none Short of breath: none Average home BPs: good Other issues:   This visit occurred during the SARS-CoV-2 public health emergency.  Safety protocols were in place, including screening questions prior to the visit, additional usage of staff PPE, and extensive cleaning of exam room while observing appropriate contact time as indicated for disinfecting solutions.   COVID 19 screen:  No recent travel or known  exposure to COVID19 The patient denies respiratory symptoms of COVID 19 at this time. The importance of social distancing was discussed today.     Review of Systems  Constitutional: Negative for chills and fever.  HENT: Negative for congestion and ear pain.   Eyes: Negative for pain and redness.  Respiratory: Negative for cough and shortness of breath.   Cardiovascular: Negative for chest pain, palpitations and leg swelling.  Gastrointestinal: Negative for abdominal pain, blood in stool, constipation, diarrhea, nausea and vomiting.  Genitourinary: Negative for dysuria.  Musculoskeletal: Negative for falls and myalgias.  Skin: Negative for rash.  Neurological: Negative for dizziness.  Psychiatric/Behavioral: Negative for depression. The patient is not nervous/anxious.       Past Medical History:  Diagnosis Date  . Allergic rhinitis   . Anxiety   . Asthma   . DDD (degenerative disc disease)    also has spinal arthritis  . Depression   . Diabetes mellitus    borderline  . Dyslipidemia   . Fibromyalgia   . GERD (gastroesophageal reflux disease)   . Lung nodules    RIGHT LUNG--LAST EVALUATED BY CHEST CT 02/01/12 - STABLE AND FOLLOW UP PLANNED IN ONE YEAR.  [name] Lyme disease   . Obesity   . OSA (obstructive sleep apnea) 05/10/2017  . Sinusitis   .  Stress disorder, acute   . Vitamin D deficiency     reports that she quit smoking about 15 years ago. She has never used smokeless tobacco. She reports that she does not drink alcohol and does not use drugs.   Current Outpatient Medications:  .  albuterol (VENTOLIN HFA) 108 (90 Base) MCG/ACT inhaler, INHALE 2 PUFFS INTO THE LUNGS EVERY 4 HOURS AS NEEDED FOR WHEEIZNG, Disp: 18 g, Rfl: 3 .  escitalopram (LEXAPRO) 20 MG tablet, Take 1 tablet (20 mg total) by mouth daily. SCHEDULE PHYSICAL EXAM, Disp: 30 tablet, Rfl: 0 .  estradiol (VIVELLE-DOT) 0.1 MG/24HR patch, Place 1 patch onto the skin 2 (two) times a week., Disp: , Rfl:  .   hydrochlorothiazide (HYDRODIURIL) 25 MG tablet, TAKE 1 TABLET BY MOUTH EVERY DAY (TAKE WITH LOSARTAN), Disp: 90 tablet, Rfl: 1 .  levothyroxine (SYNTHROID) 25 MCG tablet, Take 25 mcg by mouth daily., Disp: , Rfl:  .  losartan (COZAAR) 100 MG tablet, TAKE 1 TABLET BY MOUTH EVERY DAY (TAKE WITH HCTZ), Disp: 90 tablet, Rfl: 1 .  montelukast (SINGULAIR) 10 MG tablet, Take 1 tablet (10 mg total) by mouth at bedtime., Disp: 90 tablet, Rfl: 3 .  progesterone (PROMETRIUM) 100 MG capsule, Take 1 tablet by mouth daily x 3 weeks then on stop on the fourth week.  repeat, Disp: , Rfl:  .  Thyroid (NATURE-THROID PO), Take 118 mg by mouth daily., Disp: , Rfl:    Observations/Objective: Blood pressure 138/80, pulse 68, temperature 97.6 F (36.4 C), temperature source Temporal, height 5' 4.5" (1.638 m), weight 224 lb 8 oz (101.8 kg), SpO2 98 %.  Physical Exam Constitutional:      General: She is not in acute distress.    Appearance: Normal appearance. She is well-developed. She is obese. She is not ill-appearing or toxic-appearing.  HENT:     Head: Normocephalic.     Right Ear: Hearing, tympanic membrane, ear canal and external ear normal.     Left Ear: Hearing, tympanic membrane, ear canal and external ear normal.     Nose: Nose normal.  Eyes:     General: Lids are normal. Lids are everted, no foreign bodies appreciated.     Conjunctiva/sclera: Conjunctivae normal.     Pupils: Pupils are equal, round, and reactive to light.  Neck:     Thyroid: No thyroid mass or thyromegaly.     Vascular: No carotid bruit.     Trachea: Trachea normal.  Cardiovascular:     Rate and Rhythm: Normal rate and regular rhythm.     Heart sounds: Normal heart sounds, S1 normal and S2 normal. No murmur heard.  No gallop.   Pulmonary:     Effort: Pulmonary effort is normal. No respiratory distress.     Breath sounds: Normal breath sounds. No wheezing, rhonchi or rales.  Abdominal:     General: Bowel sounds are normal. There  is no distension or abdominal bruit.     Palpations: Abdomen is soft. There is no fluid wave or mass.     Tenderness: There is no abdominal tenderness. There is no guarding or rebound.     Hernia: No hernia is present.  Musculoskeletal:     Cervical back: Normal range of motion and neck supple.  Lymphadenopathy:     Cervical: No cervical adenopathy.  Skin:    General: Skin is warm and dry.     Findings: No rash.  Neurological:     Mental Status: She is alert.  Cranial Nerves: No cranial nerve deficit.     Sensory: No sensory deficit.  Psychiatric:        Mood and Affect: Mood is not anxious or depressed.        Speech: Speech normal.        Behavior: Behavior normal. Behavior is cooperative.        Judgment: Judgment normal.      Assessment and Plan   The patient's preventative maintenance and recommended screening tests for an annual wellness exam were reviewed in full today. Brought up to date unless services declined.  Counselled on the importance of diet, exercise, and its role in overall health and mortality. The patient's FH and SH was reviewed, including their home life, tobacco status, and drug and alcohol status.  Vaccines:  Discussed COVID19 vaccine side effects and benefits. Strongly encouraged the patient to get the vaccine. Questions answered. Pap/DVE:  2021 [facility]. Mammo: Done in 2021 per GYN Colon:  10/2010, repeat in 10 years. Smoking Status:none ETOH/ drug XEN:MMHW/KGSU HIV screen:  refused per pt done in past.  Essential hypertension Well controlled. Continue current medication.   Class 2 severe obesity due to excess calories with serious comorbidity and body mass index (BMI) of 37.0 to 37.9 in adult [facility])  Counseled on lifestlye change.  refused nutritions referral.  Will start tial of Trulicity low dose weekly.. follow up in 2-4 weeks for likely titration of medication.     Eliezer Lofts, MD

## 2019-09-17 ENCOUNTER — Telehealth: Payer: Self-pay | Admitting: *Deleted

## 2019-09-17 NOTE — Telephone Encounter (Signed)
Does her insurance cover saxenda?

## 2019-09-17 NOTE — Telephone Encounter (Signed)
Received fax from [facility] requesting alternative.  Trulicity is not covered.  Please advise.

## 2019-09-17 NOTE — Telephone Encounter (Signed)
Spoke with Jaime Chapman and advised she could print out a Trulicity prescription savings card from the [employer] site.  Patient also thought she might qualify for patient assistance due to her being out of work currently.  I also told her that if the prescription card doesn't work, to call her insurance to see if they will cover Saxenda.  I told her if she wanted to apply for patient assistance to let me know and we can help fill out the application. Jaime Chapman will try the savings card first and go from there.

## 2019-09-17 NOTE — Telephone Encounter (Signed)
Patient left a voicemail stating that her insurance company will not cover Trulicity. Patient wants to know what is the alternative and if there is a discount program for this medication?

## 2019-09-27 DIAGNOSIS — Z20822 Contact with and (suspected) exposure to covid-19: Secondary | ICD-10-CM | POA: Diagnosis not present

## 2019-10-01 ENCOUNTER — Ambulatory Visit
Admission: RE | Admit: 2019-10-01 | Discharge: 2019-10-01 | Disposition: A | Discharge status: Home / Self Care | Payer: BC Managed Care – PPO | Source: Ambulatory Visit | Attending: Orthopedic Surgery | Admitting: Orthopedic Surgery

## 2019-10-01 ENCOUNTER — Other Ambulatory Visit: Payer: Self-pay

## 2019-10-01 DIAGNOSIS — M542 Cervicalgia: Secondary | ICD-10-CM | POA: Diagnosis not present

## 2019-10-01 DIAGNOSIS — M5412 Radiculopathy, cervical region: Secondary | ICD-10-CM

## 2019-10-01 DIAGNOSIS — M4802 Spinal stenosis, cervical region: Secondary | ICD-10-CM | POA: Diagnosis not present

## 2019-10-03 ENCOUNTER — Ambulatory Visit (INDEPENDENT_AMBULATORY_CARE_PROVIDER_SITE_OTHER): Payer: BC Managed Care – PPO | Admitting: Orthopedic Surgery

## 2019-10-03 DIAGNOSIS — M5412 Radiculopathy, cervical region: Secondary | ICD-10-CM | POA: Diagnosis not present

## 2019-10-06 ENCOUNTER — Encounter: Payer: Self-pay | Admitting: Orthopedic Surgery

## 2019-10-06 NOTE — Progress Notes (Signed)
Office Visit Note   Patient: Jaime Chapman           Date of Birth: 1960-08-25           MRN: [id number] Visit Date: 10/03/2019 Requested by: Jinny Sanders, MD [place],  [place] [id number] PCP: Jinny Sanders, MD  Subjective: Chief Complaint  Patient presents with  . review MRI scan    HPI: Jaime Chapman is a 59 y.o. female who presents to the office complaining of left arm pain.  Patient reports left arm numbness and tingling that comes on multiple times per day and travels down the entirety of her left arm primarily on the ulnar side of the distal left arm.  Affects all 5 fingers.  She is dropping objects due to loss of sensation.  She returns to discuss MRIs that were obtained.  MRI of the thoracic spine showed no significant findings.  MRI of the cervical spine showed facet osteoarthritis on the left at C7-T1 with mild encroachment upon the foramen that could affect the C8 nerve.  MRI also noted mild spondylosis at C5-C6 and C6-C7 without compressive canal narrowing.  Mild foraminal narrowing at C6-C7..                ROS: All systems reviewed are negative as they relate to the chief complaint within the history of present illness.  Patient denies fevers or chills.  Assessment & Plan: Visit Diagnoses:  1. Radiculopathy, cervical region     Plan: Patient is a 59 year old female presents complaining of left arm pain.  She has had radicular left arm pain and severe numbness that comes on episodically.  It affects her entire left arm.  She had MRI of the thoracic spine that was negative for any significant findings an MRI of the cervical spine that was positive for the findings stated above in the HPI.  Plan is to refer patient to Dr. Laurence Spates for cervical spine epidural steroid injections.  Patient agreed with this plan.  Follow-up as needed.  Follow-Up Instructions: No follow-ups on file.   Orders:  Orders Placed This Encounter  Procedures  . Ambulatory  referral to Physical Medicine Rehab   No orders of the defined types were placed in this encounter.     Procedures: No procedures performed   Clinical Data: No additional findings.  Objective: Vital Signs: There were no vitals taken for this visit.  Physical Exam:  Constitutional: Patient appears well-developed HEENT:  Head: Normocephalic Eyes:EOM are normal Neck: Normal range of motion Cardiovascular: Normal rate Pulmonary/chest: Effort normal Neurologic: Patient is alert Skin: Skin is warm Psychiatric: Patient has normal mood and affect  Ortho Exam: Ortho exam demonstrates decreased sensation throughout the left arm compared with the contralateral arm.  5/5 motor strength of the bilateral grip, finger abduction, pronation/supination, bicep, tricep, deltoid.  Mild to moderate tenderness palpation throughout the cervical spine, worse over C7.  No significant pain with cervical spine range of motion.  Specialty Comments:  No specialty comments available.  Imaging: No results found.   PMFS History: Patient Active Problem List   Diagnosis Date Noted  . Class 2 severe obesity due to excess calories with serious comorbidity and body mass index (BMI) of 37.0 to 37.9 in adult ([place]) 09/14/2019  . Excessive sweating 01/09/2019  . OSA (obstructive sleep apnea) 05/10/2017  . Fatigue 04/05/2017  . Snoring 04/05/2017  . Pre-syncope 03/10/2017  . Abnormal CT of the chest 08/31/2016  .  Panic attacks 04/09/2016  . Major depression, recurrent ([place]) 05/08/2015  . Essential hypertension 07/26/2014  . Chest pressure 07/26/2014  . Constipation - functional 11/20/2013  . Hypothyroidism 11/16/2013  . H/O ventral hernia repair 05/01/2012  . Pulmonary nodules 01/26/2012  . Prediabetes 03/04/2011  . Fatty liver disease, nonalcoholic 93/79/0240  . Generalized anxiety disorder 10/21/2010  . VENTRAL HERNIA-twice recurrent 09/08/2009  . Vitamin D deficiency 12/27/2008  . Hyperlipidemia  05/22/2007  . Moderate persistent asthma 05/22/2007  . DEGENERATIVE DISC DISEASE 05/22/2007  . Fibromyalgia 05/22/2007  . Allergic rhinitis 04/14/2007  . GERD 04/14/2007   Past Medical History:  Diagnosis Date  . Allergic rhinitis   . Anxiety   . Asthma   . DDD (degenerative disc disease)    also has spinal arthritis  . Depression   . Diabetes mellitus    borderline  . Dyslipidemia   . Fibromyalgia   . GERD (gastroesophageal reflux disease)   . Lung nodules    RIGHT LUNG--LAST EVALUATED BY CHEST CT 02/01/12 - STABLE AND FOLLOW UP PLANNED IN ONE YEAR.  [name] Lyme disease   . Obesity   . OSA (obstructive sleep apnea) 05/10/2017  . Sinusitis   . Stress disorder, acute   . Vitamin D deficiency     Family History  Problem Relation Age of Onset  . Diabetes Mother   . Heart disease Mother 25  . Arthritis Mother   . Lung cancer Maternal Grandmother   . Cancer Maternal Grandmother        lung  . Colon cancer Neg Hx     Past Surgical History:  Procedure Laterality Date  . [place]   x 2   . ENDOMETRIAL ABLATION  2009  . HERNIA REPAIR  04/14/12   ventral hernia repair  . Laparoscopic ventral hernia repair w/ mesh  08/2009   Dr. Grandville Silos, [place]  . LAPAROSCOPY  2002  . TUBAL LIGATION    . VENTRAL HERNIA REPAIR  09/2006   Dr. Grandville Silos  . VENTRAL HERNIA REPAIR N/A 04/14/2012   Procedure: Laparoscopic Repair of  Ventral Hernia;  Surgeon: Pedro Earls, MD;  Location: [facility];  Service: General;  Laterality: N/A;  Repair of Recurrent Ventral Hernia   Social History   Occupational History  . Occupation: Corporate treasurer  Tobacco Use  . Smoking status: Former Smoker    Quit date: 03/01/2004    Years since quitting: 15.6  . Smokeless tobacco: Never Used  Vaping Use  . Vaping Use: Never used  Substance and Sexual Activity  . Alcohol use: No    Alcohol/week: 1.0 standard drink    Types: 1 Standard drinks or equivalent per week  . Drug use: No    Comment:  remote majijuana  . Sexual activity: Yes    Partners: Male

## 2019-10-08 DIAGNOSIS — M8580 Other specified disorders of bone density and structure, unspecified site: Secondary | ICD-10-CM | POA: Diagnosis not present

## 2019-10-08 DIAGNOSIS — Z13820 Encounter for screening for osteoporosis: Secondary | ICD-10-CM | POA: Diagnosis not present

## 2019-10-08 DIAGNOSIS — N951 Menopausal and female climacteric states: Secondary | ICD-10-CM | POA: Diagnosis not present

## 2019-10-08 LAB — HM DEXA SCAN

## 2019-10-15 ENCOUNTER — Telehealth: Payer: Self-pay

## 2019-10-15 NOTE — Telephone Encounter (Signed)
Received fax again form pharmacy stating insurance will not cover the Trulicity.  Step therapy, alternate drug therapy required prior to use of Trulicity.  I called and spoke with [facility]  She states she did print off a coupon from [employer] but the pharmacy states that since her insurance would not cover Trulcity, she can not use the coupon.  I ask her to call her insurance to see if they would cover Saxenda.  She will call her insurance and let me know if they will approve it.  Otherwise she would have to try something like Metformin first.

## 2019-10-15 NOTE — Telephone Encounter (Signed)
Pt left v/m that she understood that [employer] would not approve trulicity; pt request status of getting the trulucity. Pt request cb would like to get started on medication. Per 09/17/19 phone note that pt has already spoken with Butch Penny CMA today about her medication.

## 2019-10-16 ENCOUNTER — Other Ambulatory Visit: Payer: Self-pay | Admitting: Family Medicine

## 2019-10-16 MED ORDER — SAXENDA 18 MG/3ML ~~LOC~~ SOPN: 0.6000 mg | PEN_INJECTOR | SUBCUTANEOUS | 11 refills | Status: DC

## 2019-10-16 NOTE — Addendum Note (Signed)
Addended by: Carter Kitten on: 10/16/2019 03:54 PM   Modules accepted: Orders

## 2019-10-16 NOTE — Progress Notes (Signed)
Rx for saxenda sent in.

## 2019-10-16 NOTE — Telephone Encounter (Signed)
Pt lmovm with information in reference to her medication.... pt states that she was told that [facility] will give approval for the medication as you requested for her to check on if covered

## 2019-10-16 NOTE — Telephone Encounter (Signed)
Please send in Rx for Saxenda.

## 2019-10-17 NOTE — Telephone Encounter (Signed)
[facility] for Saxenda. PA completed on CoverMyMeds and sent for review.  Can take up to 72 hours for a decision.

## 2019-10-19 NOTE — Telephone Encounter (Signed)
Pt was checking the status of prescription prior authorization. I let her know it was don on 8/18 and that it can take up to 72 hours. She said she will wait until Monday and will call back then.

## 2019-10-22 ENCOUNTER — Ambulatory Visit (INDEPENDENT_AMBULATORY_CARE_PROVIDER_SITE_OTHER): Payer: BC Managed Care – PPO | Admitting: Physical Medicine and Rehabilitation

## 2019-10-22 ENCOUNTER — Encounter: Payer: Self-pay | Admitting: Physical Medicine and Rehabilitation

## 2019-10-22 ENCOUNTER — Other Ambulatory Visit: Payer: Self-pay

## 2019-10-22 ENCOUNTER — Ambulatory Visit: Payer: Self-pay

## 2019-10-22 VITALS — BP 154/84 | HR 87

## 2019-10-22 DIAGNOSIS — M5412 Radiculopathy, cervical region: Secondary | ICD-10-CM

## 2019-10-22 MED ORDER — METHYLPREDNISOLONE ACETATE 80 MG/ML IJ SUSP
80.0000 mg | Freq: Once | INTRAMUSCULAR | Status: AC
  Administered 2019-10-22: 80 mg

## 2019-10-22 NOTE — Progress Notes (Signed)
Pt state that she has pain in her neck and left shoulder. Pt states if she sitting long it hurt or doing anything to long it hurt. Pt states heat pad relief of pain. No dry needling. H/o Fibromyalgia and Lyme Dz.  Both arms and hands ulnar side. Not just left. More right neck pain  Numeric Pain Rating Scale and Functional Assessment Average Pain 4   In the last MONTH (on 0-10 scale) has pain interfered with the following?  1. General activity like being  able to carry out your everyday physical activities such as walking, climbing stairs, carrying groceries, or moving a chair?  Rating(9)   +Driver, -BT, -Dye Allergies.

## 2019-10-22 NOTE — Progress Notes (Signed)
Jaime Chapman - 59 y.o. female MRN [id number]  Date of birth: 1960-05-01  Office Visit Note: Visit Date: 10/22/2019 PCP: Jinny Sanders, MD Referred by: Jinny Sanders, MD  Subjective: Chief Complaint  Patient presents with  . Left Shoulder - Pain  . Neck - Pain   HPI: Jaime Chapman is a 59 y.o. female who comes in today at the request of Dr. Anderson Malta for planned Left C7-T1 Cervical epidural steroid injection with fluoroscopic guidance.  The patient has failed conservative care including home exercise, medications, time and activity modification.  This injection will be diagnostic and hopefully therapeutic.  Please see requesting physician notes for further details and justification.  She complains mainly of right-sided neck pain but left radicular type symptoms with some almost dysesthesia.  She gets right sided symptoms down the arm as well.  MRI was obtained and reviewed today.  Is reviewed in the notes below.  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  Some foraminal narrowing.  No high-grade central stenosis or nerve compression.  If injection is not very beneficial would suggest physical therapy with dry needling for myofascial trigger points.  Patient does have a diagnosis of fibromyalgia.  She has seen Dr. Estanislado Pandy in the past for that but now is seen at a different facility because she also has had a history of Lyme disease.   ROS Otherwise per HPI.  Assessment & Plan: Visit Diagnoses:  1. Cervical radiculopathy     Plan: No additional findings.   Meds & Orders:  Meds ordered this encounter  Medications  . methylPREDNISolone acetate (DEPO-MEDROL) injection 80 mg    Orders Placed This Encounter  Procedures  . XR C-ARM NO REPORT  . Epidural Steroid injection    Follow-up: Return for visit to requesting physician as needed.   Procedures: No procedures performed  Cervical Epidural Steroid Injection - Interlaminar Approach with Fluoroscopic  Guidance  Patient: Jaime Chapman      Date of Birth: 1960-04-08 MRN: [id number] PCP: Jinny Sanders, MD      Visit Date: 10/22/2019   Universal Protocol:    Date/Time: 10/21/2109:44 AM  Consent Given By: the patient  Position: PRONE  Additional Comments: Vital signs were monitored before and after the procedure. Patient was prepped and draped in the usual sterile fashion. The correct patient, procedure, and site was verified.   Injection Procedure Details:  Procedure Site One Meds Administered:  Meds ordered this encounter  Medications  . methylPREDNISolone acetate (DEPO-MEDROL) injection 80 mg     Laterality: Left  Location/Site: C7-T1  Needle size: 20 G  Needle type: Touhy  Needle Placement: Paramedian epidural space  Findings:  -Comments: Excellent flow of contrast into the epidural space.  Procedure Details: Using a paramedian approach from the side mentioned above, the region overlying the inferior lamina was localized under fluoroscopic visualization and the soft tissues overlying this structure were infiltrated with 4 ml. of 1% Lidocaine without Epinephrine. A # 20 gauge, Tuohy needle was inserted into the epidural space using a paramedian approach.  The epidural space was localized using loss of resistance along with lateral and contralateral oblique bi-planar fluoroscopic views.  After negative aspirate for air, blood, and CSF, a 2 ml. volume of Isovue-250 was injected into the epidural space and the flow of contrast was observed. Radiographs were obtained for documentation purposes.   The injectate was administered into the level noted above.  Additional Comments:  The patient tolerated  the procedure well Dressing: 2 x 2 sterile gauze and Band-Aid    Post-procedure details: Patient was observed during the procedure. Post-procedure instructions were reviewed.  Patient left the clinic in stable condition.     Clinical History: Left-sided neck  pain radiating to the shoulder and arm.  EXAM: MRI CERVICAL SPINE WITHOUT CONTRAST  TECHNIQUE: Multiplanar, multisequence MR imaging of the cervical spine was performed. No intravenous contrast was administered.  COMPARISON:  CT 03/08/2019  FINDINGS: Alignment: Normal  Vertebrae: No fracture or focal bone lesion.  Cord: Normal.  No cord compression or focal cord lesion.  Posterior Fossa, vertebral arteries, paraspinal tissues: Normal  Disc levels:  Foramen magnum is widely patent. No abnormality at the C1-2 articulation.  C2-3 and C3-4: Normal  C4-5: Minimal noncompressive disc bulge.  C5-6: Mild disc bulge and uncovertebral prominence. No compressive canal stenosis. No significant foraminal narrowing.  C6-7: Minimal disc bulge and uncovertebral prominence. No compressive canal stenosis. Mild bilateral foraminal encroachment, not likely compressive.  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 No disc pathology. No canal stenosis. Foraminal encroachment on the left that could possibly affect the C8 nerve.  IMPRESSION: At C7-T1, there is facet osteoarthritis on the left that could be painful. Mild encroachment upon the foramen on the left could affect the C8 nerve.  Mild spondylosis at C5-6 and C6-7. No compressive canal narrowing. Mild foraminal narrowing at C6-7, not likely compressive.   Electronically Signed   By: Nelson Chimes M.D.   On: 10/01/2019 12:36   She reports that she quit smoking about 15 years ago. She has never used smokeless tobacco.  Recent Labs    01/09/19 1442 09/04/19 0838  HGBA1C 5.7 6.3    Objective:  VS:  HT:    WT:   BMI:     BP:(!) 154/84  HR:87bpm  TEMP: ( )  RESP:  Physical Exam Vitals and nursing note reviewed.  Constitutional:      General: She is not in acute distress.    Appearance: Normal appearance. She is not ill-appearing.  HENT:     Head: Normocephalic and atraumatic.     Right Ear:  External ear normal.     Left Ear: External ear normal.  Eyes:     Extraocular Movements: Extraocular movements intact.  Cardiovascular:     Rate and Rhythm: Normal rate.     Pulses: Normal pulses.  Musculoskeletal:     Cervical back: Tenderness present. No rigidity.     Right lower leg: No edema.     Left lower leg: No edema.     Comments: Patient has good strength in the upper extremities including 5 out of 5 strength in wrist extension long finger flexion and APB.  There is no atrophy of the hands intrinsically.  There is a negative Hoffmann's test.   Lymphadenopathy:     Cervical: No cervical adenopathy.  Skin:    Findings: No erythema, lesion or rash.  Neurological:     General: No focal deficit present.     Mental Status: She is alert and oriented to person, place, and time.     Sensory: No sensory deficit.     Motor: No weakness or abnormal muscle tone.     Coordination: Coordination normal.  Psychiatric:        Mood and Affect: Mood normal.        Behavior: Behavior normal.     Ortho Exam  Imaging: XR C-ARM NO REPORT  Result Date: 10/22/2019 Please  see Notes tab for imaging impression.   Past Medical/Family/Surgical/Social History: Medications & Allergies reviewed per EMR, new medications updated. Patient Active Problem List   Diagnosis Date Noted  . Class 2 severe obesity due to excess calories with serious comorbidity and body mass index (BMI) of 37.0 to 37.9 in adult ([place]) 09/14/2019  . Excessive sweating 01/09/2019  . OSA (obstructive sleep apnea) 05/10/2017  . Fatigue 04/05/2017  . Snoring 04/05/2017  . Pre-syncope 03/10/2017  . Abnormal CT of the chest 08/31/2016  . Panic attacks 04/09/2016  . Major depression, recurrent ([place]) 05/08/2015  . Essential hypertension 07/26/2014  . Chest pressure 07/26/2014  . Constipation - functional 11/20/2013  . Hypothyroidism 11/16/2013  . H/O ventral hernia repair 05/01/2012  . Pulmonary nodules 01/26/2012  .  Prediabetes 03/04/2011  . Fatty liver disease, nonalcoholic 09/98/3382  . Generalized anxiety disorder 10/21/2010  . VENTRAL HERNIA-twice recurrent 09/08/2009  . Vitamin D deficiency 12/27/2008  . Hyperlipidemia 05/22/2007  . Moderate persistent asthma 05/22/2007  . DEGENERATIVE DISC DISEASE 05/22/2007  . Fibromyalgia 05/22/2007  . Allergic rhinitis 04/14/2007  . GERD 04/14/2007   Past Medical History:  Diagnosis Date  . Allergic rhinitis   . Anxiety   . Asthma   . DDD (degenerative disc disease)    also has spinal arthritis  . Depression   . Diabetes mellitus    borderline  . Dyslipidemia   . Fibromyalgia   . GERD (gastroesophageal reflux disease)   . Lung nodules    RIGHT LUNG--LAST EVALUATED BY CHEST CT 02/01/12 - STABLE AND FOLLOW UP PLANNED IN ONE YEAR.  [name] Lyme disease   . Obesity   . OSA (obstructive sleep apnea) 05/10/2017  . Sinusitis   . Stress disorder, acute   . Vitamin D deficiency    Family History  Problem Relation Age of Onset  . Diabetes Mother   . Heart disease Mother 60  . Arthritis Mother   . Lung cancer Maternal Grandmother   . Cancer Maternal Grandmother        lung  . Colon cancer Neg Hx    Past Surgical History:  Procedure Laterality Date  . [place]   x 2   . ENDOMETRIAL ABLATION  2009  . HERNIA REPAIR  04/14/12   ventral hernia repair  . Laparoscopic ventral hernia repair w/ mesh  08/2009   Dr. Grandville Silos, [place]  . LAPAROSCOPY  2002  . TUBAL LIGATION    . VENTRAL HERNIA REPAIR  09/2006   Dr. Grandville Silos  . VENTRAL HERNIA REPAIR N/A 04/14/2012   Procedure: Laparoscopic Repair of  Ventral Hernia;  Surgeon: Pedro Earls, MD;  Location: [facility];  Service: General;  Laterality: N/A;  Repair of Recurrent Ventral Hernia   Social History   Occupational History  . Occupation: Corporate treasurer  Tobacco Use  . Smoking status: Former Smoker    Quit date: 03/01/2004    Years since quitting: 15.6  . Smokeless tobacco: Never Used    Vaping Use  . Vaping Use: Never used  Substance and Sexual Activity  . Alcohol use: No    Alcohol/week: 1.0 standard drink    Types: 1 Standard drinks or equivalent per week  . Drug use: No    Comment: remote majijuana  . Sexual activity: Yes    Partners: Male

## 2019-10-22 NOTE — Telephone Encounter (Signed)
Saxenda approved from 10/17/2019 through 02/19/2020.  [facility] notified of approval via fax.  [facility] notified of approval via telephone.

## 2019-10-22 NOTE — Procedures (Signed)
Cervical Epidural Steroid Injection - Interlaminar Approach with Fluoroscopic Guidance  Patient: Jaime Chapman      Date of Birth: [birth year] MRN: [id number] PCP: Jinny Sanders, MD      Visit Date: 10/22/2019   Universal Protocol:    Date/Time: 10/21/2109:44 AM  Consent Given By: the patient  Position: PRONE  Additional Comments: Vital signs were monitored before and after the procedure. Patient was prepped and draped in the usual sterile fashion. The correct patient, procedure, and site was verified.   Injection Procedure Details:  Procedure Site One Meds Administered:  Meds ordered this encounter  Medications  . methylPREDNISolone acetate (DEPO-MEDROL) injection 80 mg     Laterality: Left  Location/Site: C7-T1  Needle size: 20 G  Needle type: Touhy  Needle Placement: Paramedian epidural space  Findings:  -Comments: Excellent flow of contrast into the epidural space.  Procedure Details: Using a paramedian approach from the side mentioned above, the region overlying the inferior lamina was localized under fluoroscopic visualization and the soft tissues overlying this structure were infiltrated with 4 ml. of 1% Lidocaine without Epinephrine. A # 20 gauge, Tuohy needle was inserted into the epidural space using a paramedian approach.  The epidural space was localized using loss of resistance along with lateral and contralateral oblique bi-planar fluoroscopic views.  After negative aspirate for air, blood, and CSF, a 2 ml. volume of Isovue-250 was injected into the epidural space and the flow of contrast was observed. Radiographs were obtained for documentation purposes.   The injectate was administered into the level noted above.  Additional Comments:  The patient tolerated the procedure well Dressing: 2 x 2 sterile gauze and Band-Aid    Post-procedure details: Patient was observed during the procedure. Post-procedure instructions were reviewed.  Patient left  the clinic in stable condition.

## 2019-10-24 ENCOUNTER — Other Ambulatory Visit: Payer: Self-pay | Admitting: *Deleted

## 2019-10-24 MED ORDER — BD PEN NEEDLE MICRO U/F 32G X 6 MM MISC: 0 refills | Status: DC

## 2019-11-15 DIAGNOSIS — A6920 Lyme disease, unspecified: Secondary | ICD-10-CM | POA: Diagnosis not present

## 2019-11-15 DIAGNOSIS — E039 Hypothyroidism, unspecified: Secondary | ICD-10-CM | POA: Diagnosis not present

## 2019-11-15 DIAGNOSIS — I10 Essential (primary) hypertension: Secondary | ICD-10-CM | POA: Diagnosis not present

## 2019-11-15 DIAGNOSIS — N951 Menopausal and female climacteric states: Secondary | ICD-10-CM | POA: Diagnosis not present

## 2019-11-15 DIAGNOSIS — M2550 Pain in unspecified joint: Secondary | ICD-10-CM | POA: Diagnosis not present

## 2019-11-15 DIAGNOSIS — E8941 Symptomatic postprocedural ovarian failure: Secondary | ICD-10-CM | POA: Diagnosis not present

## 2019-11-15 DIAGNOSIS — F419 Anxiety disorder, unspecified: Secondary | ICD-10-CM | POA: Diagnosis not present

## 2019-11-15 DIAGNOSIS — E559 Vitamin D deficiency, unspecified: Secondary | ICD-10-CM | POA: Diagnosis not present

## 2019-11-15 DIAGNOSIS — E782 Mixed hyperlipidemia: Secondary | ICD-10-CM | POA: Diagnosis not present

## 2019-11-24 ENCOUNTER — Other Ambulatory Visit: Payer: Self-pay | Admitting: Family Medicine

## 2019-11-30 ENCOUNTER — Other Ambulatory Visit: Payer: Self-pay

## 2019-11-30 ENCOUNTER — Ambulatory Visit (INDEPENDENT_AMBULATORY_CARE_PROVIDER_SITE_OTHER)
Admission: RE | Admit: 2019-11-30 | Discharge: 2019-11-30 | Disposition: A | Discharge status: Home / Self Care | Payer: BC Managed Care – PPO | Source: Ambulatory Visit | Attending: Family Medicine | Admitting: Family Medicine

## 2019-11-30 ENCOUNTER — Encounter: Payer: Self-pay | Admitting: Family Medicine

## 2019-11-30 ENCOUNTER — Ambulatory Visit (INDEPENDENT_AMBULATORY_CARE_PROVIDER_SITE_OTHER): Payer: BC Managed Care – PPO | Admitting: Family Medicine

## 2019-11-30 VITALS — BP 100/76 | HR 80 | Temp 98.0°F | Ht 64.5 in | Wt 220.5 lb

## 2019-11-30 DIAGNOSIS — Z23 Encounter for immunization: Secondary | ICD-10-CM

## 2019-11-30 DIAGNOSIS — M1711 Unilateral primary osteoarthritis, right knee: Secondary | ICD-10-CM | POA: Diagnosis not present

## 2019-11-30 DIAGNOSIS — Z6837 Body mass index (BMI) 37.0-37.9, adult: Secondary | ICD-10-CM | POA: Diagnosis not present

## 2019-11-30 DIAGNOSIS — M25561 Pain in right knee: Secondary | ICD-10-CM

## 2019-11-30 DIAGNOSIS — E6609 Other obesity due to excess calories: Secondary | ICD-10-CM

## 2019-11-30 LAB — URIC ACID: Uric Acid, Serum: 6.3 mg/dL (ref 2.4–7.0)

## 2019-11-30 MED ORDER — DICLOFENAC SODIUM 75 MG PO TBEC
75.0000 mg | DELAYED_RELEASE_TABLET | Freq: Two times a day (BID) | ORAL | 0 refills | Status: DC
Start: 2019-11-30 — End: 2020-06-03

## 2019-11-30 NOTE — Progress Notes (Signed)
Chief Complaint  Patient presents with  . Knee Pain    Right    History of Present Illness: HPI   59 year old female presents with new onset right knee pain x 2 weeks   She reports woke up with pain in right knee, associated with mild swelling, slight redness. Area was hot.  Pain has improved in last 24 hours. Worse with walking, standing, squatting.   No known fall,  No known injury.  She used ibuprofen 200 mg off and on for pain, also applied CBD pain relief cream, biofreeze.   No past procedures on knee, no past X-ray.   HAs started saxenda for weight loss.. has lost 5 lbs and  Has noted decrease in appetite. Wt Readings from Last 3 Encounters:  11/30/19 220 lb 8 oz (100 kg)  09/14/19 224 lb 8 oz (101.8 kg)  01/09/19 220 lb 4 oz (99.9 kg)       This visit occurred during the SARS-CoV-2 public health emergency.  Safety protocols were in place, including screening questions prior to the visit, additional usage of staff PPE, and extensive cleaning of exam room while observing appropriate contact time as indicated for disinfecting solutions.   COVID 19 screen:  No recent travel or known exposure to COVID19 The patient denies respiratory symptoms of COVID 19 at this time. The importance of social distancing was discussed today.     Review of Systems  Constitutional: Negative for chills and fever.  HENT: Negative for congestion and ear pain.   Eyes: Negative for pain and redness.  Respiratory: Negative for cough and shortness of breath.   Cardiovascular: Negative for chest pain, palpitations and leg swelling.  Gastrointestinal: Negative for abdominal pain, blood in stool, constipation, diarrhea, nausea and vomiting.  Genitourinary: Negative for dysuria.  Musculoskeletal: Positive for joint pain. Negative for falls and myalgias.  Skin: Negative for rash.  Neurological: Negative for dizziness.  Psychiatric/Behavioral: Negative for depression. The patient is not  nervous/anxious.       Past Medical History:  Diagnosis Date  . Allergic rhinitis   . Anxiety   . Asthma   . DDD (degenerative disc disease)    also has spinal arthritis  . Depression   . Diabetes mellitus    borderline  . Dyslipidemia   . Fibromyalgia   . GERD (gastroesophageal reflux disease)   . Lung nodules    RIGHT LUNG--LAST EVALUATED BY CHEST CT 02/01/12 - STABLE AND FOLLOW UP PLANNED IN ONE YEAR.  [name] Lyme disease   . Obesity   . OSA (obstructive sleep apnea) 05/10/2017  . Sinusitis   . Stress disorder, acute   . Vitamin D deficiency     reports that she quit smoking about 15 years ago. She has never used smokeless tobacco. She reports that she does not drink alcohol and does not use drugs.   Current Outpatient Medications:  .  albuterol (VENTOLIN HFA) 108 (90 Base) MCG/ACT inhaler, INHALE 2 PUFFS INTO THE LUNGS EVERY 4 HOURS AS NEEDED FOR WHEEIZNG, Disp: 18 g, Rfl: 3 .  ARMOUR THYROID 90 MG tablet, Take 90 mg by mouth daily., Disp: , Rfl:  .  escitalopram (LEXAPRO) 20 MG tablet, TAKE 1 TABLET (20 MG TOTAL) BY MOUTH DAILY. SCHEDULE PHYSICAL EXAM, Disp: 90 tablet, Rfl: 1 .  estradiol (VIVELLE-DOT) 0.1 MG/24HR patch, Place 1 patch onto the skin 2 (two) times a week., Disp: , Rfl:  .  hydrochlorothiazide (HYDRODIURIL) 25 MG tablet, TAKE 1 TABLET BY MOUTH  EVERY DAY (TAKE WITH LOSARTAN), Disp: 90 tablet, Rfl: 1 .  Insulin Pen Needle (BD PEN NEEDLE MICRO U/F) 32G X 6 MM MISC, Use to inject Saxenda once weekly, Disp: 50 each, Rfl: 0 .  Liraglutide -Weight Management (SAXENDA) 18 MG/3ML SOPN, Inject 0.1 mLs (0.6 mg total) into the skin once a week., Disp: 3 mL, Rfl: 11 .  losartan (COZAAR) 100 MG tablet, TAKE 1 TABLET BY MOUTH EVERY DAY (TAKE WITH HCTZ), Disp: 90 tablet, Rfl: 1 .  montelukast (SINGULAIR) 10 MG tablet, Take 1 tablet (10 mg total) by mouth at bedtime., Disp: 90 tablet, Rfl: 3 .  progesterone (PROMETRIUM) 100 MG capsule, Take 1 tablet by mouth daily x 3 weeks then on  stop on the fourth week.  repeat, Disp: , Rfl:  .  valACYclovir (VALTREX) 1000 MG tablet, Take 1,000 mg by mouth 3 (three) times daily., Disp: , Rfl:    Observations/Objective: Temperature 98 F (36.7 C), temperature source Temporal, height 5' 4.5" (1.638 m), weight 220 lb 8 oz (100 kg).  Physical Exam Constitutional:      General: She is not in acute distress.    Appearance: Normal appearance. She is well-developed. She is not ill-appearing or toxic-appearing.  HENT:     Head: Normocephalic.     Right Ear: Hearing, tympanic membrane, ear canal and external ear normal. Tympanic membrane is not erythematous, retracted or bulging.     Left Ear: Hearing, tympanic membrane, ear canal and external ear normal. Tympanic membrane is not erythematous, retracted or bulging.     Nose: No mucosal edema or rhinorrhea.     Right Sinus: No maxillary sinus tenderness or frontal sinus tenderness.     Left Sinus: No maxillary sinus tenderness or frontal sinus tenderness.     Mouth/Throat:     Pharynx: Uvula midline.  Eyes:     General: Lids are normal. Lids are everted, no foreign bodies appreciated.     Conjunctiva/sclera: Conjunctivae normal.     Pupils: Pupils are equal, round, and reactive to light.  Neck:     Thyroid: No thyroid mass or thyromegaly.     Vascular: No carotid bruit.     Trachea: Trachea normal.  Cardiovascular:     Rate and Rhythm: Normal rate and regular rhythm.     Pulses: Normal pulses.     Heart sounds: Normal heart sounds, S1 normal and S2 normal. No murmur heard.  No friction rub. No gallop.   Pulmonary:     Effort: Pulmonary effort is normal. No tachypnea or respiratory distress.     Breath sounds: Normal breath sounds. No decreased breath sounds, wheezing, rhonchi or rales.  Abdominal:     General: Bowel sounds are normal.     Palpations: Abdomen is soft.     Tenderness: There is no abdominal tenderness.  Musculoskeletal:     Cervical back: Normal range of motion  and neck supple.     Right knee: Swelling present. No effusion or erythema. Decreased range of motion. Tenderness present over the medial joint line. No LCL laxity, MCL laxity, ACL laxity or PCL laxity. Abnormal meniscus. Normal patellar mobility.     Instability Tests: Anterior drawer test negative. Posterior drawer test negative. Anterior Lachman test negative. Medial McMurray test positive and lateral McMurray test positive.     Left knee: Normal.  Skin:    General: Skin is warm and dry.     Findings: No rash.  Neurological:     Mental Status: She  is alert.  Psychiatric:        Mood and Affect: Mood is not anxious or depressed.        Speech: Speech normal.        Behavior: Behavior normal. Behavior is cooperative.        Thought Content: Thought content normal.        Judgment: Judgment normal.      Assessment and Plan Acute pain of right knee OA vs meniscal tear. Given redness and sensitivity to light touch earl in course.. possible gout but less likely. Concerning for meniscal tear as McMurrays positive.. may need further imaging.  Eval with X-ray and uric acid.   Treat with ICe, elevation, limited bending, brace as needed and start diclofenac BOD.  Follow up in 2 weeks if not improing.       Eliezer Lofts, MD

## 2019-11-30 NOTE — Addendum Note (Signed)
Addended by: Carter Kitten on: 11/30/2019 09:09 AM   Modules accepted: Orders

## 2019-11-30 NOTE — Patient Instructions (Signed)
No repetitive knee movements.  Ice and elevate. Start diclofenac twice daily for knee pain.  We will call with lab and X-ray results.    POSSIBLE Meniscus Tear  A meniscus tear is a knee injury that happens when a piece of the meniscus is torn. The meniscus is a thick, rubbery, wedge-shaped cartilage in the knee. Two menisci are located in each knee. They sit between the upper bone (femur) and lower bone (tibia) that make up the knee joint. Each meniscus acts as a shock absorber for the knee. A torn meniscus is one of the most common types of knee injuries. This injury can range from mild to severe. Surgery may be needed to repair a severe tear. What are the causes? This condition may be caused by any kneeling, squatting, twisting, or pivoting movement. Sports-related injuries are the most common cause. These often occur from:  Running and stopping suddenly. ? Changing direction. ? Being tackled or knocked off your feet.  Lifting or carrying heavy weights. As people get older, their menisci get thinner and weaker. In these people, tears can happen more easily, such as from climbing stairs. What increases the risk? You are more likely to develop this condition if you:  Play contact sports.  Have a job that requires kneeling or squatting.  Are female.  Are over 54 years old. What are the signs or symptoms? Symptoms of this condition include:  Knee pain, especially at the side of the knee joint. You may feel pain when the injury occurs, or you may only hear a pop and feel pain later.  A feeling that your knee is clicking, catching, locking, or giving way.  Not being able to fully bend or extend your knee.  Bruising or swelling in your knee. How is this diagnosed? This condition may be diagnosed based on your symptoms and a physical exam. You may also have tests, such as:  X-rays.  MRI.  A procedure to look inside your knee with a narrow surgical telescope  (arthroscopy). You may be referred to a knee specialist (orthopedic surgeon). How is this treated? Treatment for this injury depends on the severity of the tear. Treatment for a mild tear may include:  Rest.  Medicine to reduce pain and swelling. This is usually a nonsteroidal anti-inflammatory drug (NSAID), like ibuprofen.  A knee brace, sleeve, or wrap.  Using crutches or a walker to keep weight off your knee and to help you walk.  Exercises to strengthen your knee (physical therapy). You may need surgery if you have a severe tear or if other treatments are not working. Follow these instructions at home: If you have a brace, sleeve, or wrap:  Wear it as told by your health care provider. Remove it only as told by your health care provider.  Loosen the brace, sleeve, or wrap if your toes tingle, become numb, or turn cold and blue.  Keep the brace, sleeve, or wrap clean and dry.  If the brace, sleeve, or wrap is not waterproof: ? Do not let it get wet. ? Cover it with a watertight covering when you take a bath or shower. Managing pain and swelling   Take over-the-counter and prescription medicines only as told by your health care provider.  If directed, put ice on your knee: ? If you have a removable brace, sleeve, or wrap, remove it as told by your health care provider. ? Put ice in a plastic bag. ? Place a towel between your skin and  the bag. ? Leave the ice on for 20 minutes, 2-3 times per day.  Move your toes often to avoid stiffness and to lessen swelling.  Raise (elevate) the injured area above the level of your heart while you are sitting or lying down. Activity  Do not use the injured limb to support your body weight until your health care provider says that you can. Use crutches or a walker as told by your health care provider.  Return to your normal activities as told by your health care provider. Ask your health care provider what activities are safe for  you.  Perform range-of-motion exercises only as told by your health care provider.  Begin doing exercises to strengthen your knee and leg muscles only as told by your health care provider. After you recover, your health care provider may recommend these exercises to help prevent another injury. General instructions  Use a knee brace, sleeve, or wrap as told by your health care provider.  Ask your health care provider when it is safe to drive if you have a brace, sleeve, or wrap on your knee.  Do not use any products that contain nicotine or tobacco, such as cigarettes, e-cigarettes, and chewing tobacco. If you need help quitting, ask your health care provider.  Ask your health care provider if the medicine prescribed to you: ? Requires you to avoid driving or using heavy machinery. ? Can cause constipation. You may need to take these actions to prevent or treat constipation:  Drink enough fluid to keep your urine pale yellow.  Take over-the-counter or prescription medicines.  Eat foods that are high in fiber, such as beans, whole grains, and fresh fruits and vegetables.  Limit foods that are high in fat and processed sugars, such as fried or sweet foods.  Keep all follow-up visits as told by your health care provider. This is important. Contact a health care provider if:  You have a fever.  Your knee becomes red, tender, or swollen.  Your pain medicine is not helping.  Your symptoms get worse or do not improve after 2 weeks of home care. Summary  A meniscus tear is a knee injury that happens when a piece of the meniscus is torn.  Treatment for this injury depends on the severity of the tear. You may need surgery if you have a severe tear or if other treatments are not working.  Rest, ice, and raise (elevate) your injured knee as told by your health care provider. This will help lessen pain and swelling.  Contact a health care provider if you have new symptoms, or your  symptoms get worse or do not improve after 2 weeks of home care.  Keep all follow-up visits as told by your health care provider. This is important. This information is not intended to replace advice given to you by your health care provider. Make sure you discuss any questions you have with your health care provider. Document Revised: 08/30/2017 Document Reviewed: 08/30/2017 Elsevier Patient Education  [employer].

## 2019-11-30 NOTE — Assessment & Plan Note (Signed)
OA vs meniscal tear. Given redness and sensitivity to light touch earl in course.. possible gout but less likely. Concerning for meniscal tear as McMurrays positive.. may need further imaging.  Eval with X-ray and uric acid.   Treat with ICe, elevation, limited bending, brace as needed and start diclofenac BOD.  Follow up in 2 weeks if not improing.

## 2019-12-12 ENCOUNTER — Other Ambulatory Visit: Payer: Self-pay | Admitting: Family Medicine

## 2019-12-18 ENCOUNTER — Encounter: Payer: Self-pay | Admitting: Family Medicine

## 2019-12-18 ENCOUNTER — Ambulatory Visit (INDEPENDENT_AMBULATORY_CARE_PROVIDER_SITE_OTHER): Payer: BC Managed Care – PPO | Admitting: Family Medicine

## 2019-12-18 ENCOUNTER — Other Ambulatory Visit: Payer: Self-pay

## 2019-12-18 VITALS — BP 124/64 | HR 70 | Temp 97.5°F | Resp 20 | Ht 64.49 in | Wt 219.5 lb

## 2019-12-18 DIAGNOSIS — Z6837 Body mass index (BMI) 37.0-37.9, adult: Secondary | ICD-10-CM

## 2019-12-18 DIAGNOSIS — E6609 Other obesity due to excess calories: Secondary | ICD-10-CM

## 2019-12-18 DIAGNOSIS — M25561 Pain in right knee: Secondary | ICD-10-CM | POA: Diagnosis not present

## 2019-12-18 MED ORDER — SAXENDA 18 MG/3ML ~~LOC~~ SOPN: 0.6000 mg | PEN_INJECTOR | SUBCUTANEOUS | 11 refills | Status: DC

## 2019-12-18 MED ORDER — TRAMADOL HCL 50 MG PO TABS: 50.0000 mg | ORAL_TABLET | Freq: Four times a day (QID) | ORAL | 0 refills | Status: AC | PRN

## 2019-12-18 NOTE — Assessment & Plan Note (Signed)
Increase Saxenda to 3 times weekly.  Encouraged exercise, weight loss, healthy eating habits.

## 2019-12-18 NOTE — Assessment & Plan Note (Signed)
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  Will start  tramadol for pain, use crutches and brace, ice and refer to [facility] for likely steroid injection and possible MRI.

## 2019-12-18 NOTE — Patient Instructions (Addendum)
Can use crutches or cane for stability.  Start tramadol for pain.. call if pain not controlled.  Ice knee as discussed and wear brace until appt with orhto.  Ortho  Will call to make a referral appointment.   Increase Saxenda to  0.6 mg 2-3 times weekly.

## 2019-12-18 NOTE — Progress Notes (Signed)
Chief Complaint  Patient presents with  . Knee Pain    pt c/o knee pain x57month. Has finished pain medictation given on 11/30/19 and pain has gotten worse.    History of Present Illness: HPI   59 year old female presents for continued right knee pain. Now ongoing x 1 month.  She was seen on 11/30/2019  Reviewed and copied HPI: She reports woke up with pain in right knee, associated with mild swelling, slight redness. Area was hot.  Worse with walking, standing, squatting.  No known fall,  No known injury. She used ibuprofen 200 mg off and on for pain, also applied CBD pain relief cream, biofreeze.  At OV : Given  Diclofenac BID and recommended ice and elevation...  Only helped a lie X-ray right knee: IMPRESSION: Tricompartment arthritis, most advanced involving the medial Compartment.  Today she reports knee pain is not better... worse in fact.  Pain is 4-9/10 on pain scale.  Pain is entire knee, mainly medially. Knee is very stiff. No redness, mild swelling in knee. Has tried ibuprofen, aleve, ibuprofen, heat and ice.   Occ knee popping and occ locking,   Has seen ORTHO in past for shoulder... Dr. Marlou Sa at [facility].  Wt Readings from Last 3 Encounters:  12/18/19 219 lb 8 oz (99.6 kg)  11/30/19 220 lb 8 oz (100 kg)  09/14/19 224 lb 8 oz (101.8 kg)   She has tolerated saxenda weekly and has had some weight loss... she is only taking this weekly instead of daily. Insurance would not cover Trulicity.  This visit occurred during the SARS-CoV-2 public health emergency.  Safety protocols were in place, including screening questions prior to the visit, additional usage of staff PPE, and extensive cleaning of exam room while observing appropriate contact time as indicated for disinfecting solutions.   COVID 19 screen:  No recent travel or known exposure to COVID19 The patient denies respiratory symptoms of COVID 19 at this time. The importance of social distancing was  discussed today.     Review of Systems  Constitutional: Negative for chills and fever.  HENT: Negative for congestion and ear pain.   Eyes: Negative for pain and redness.  Respiratory: Negative for cough and shortness of breath.   Cardiovascular: Negative for chest pain, palpitations and leg swelling.  Gastrointestinal: Negative for abdominal pain, blood in stool, constipation, diarrhea, nausea and vomiting.  Genitourinary: Negative for dysuria.  Musculoskeletal: Negative for falls and myalgias.  Skin: Negative for rash.  Neurological: Negative for dizziness.  Psychiatric/Behavioral: Negative for depression. The patient is not nervous/anxious.       Past Medical History:  Diagnosis Date  . Allergic rhinitis   . Anxiety   . Asthma   . DDD (degenerative disc disease)    also has spinal arthritis  . Depression   . Diabetes mellitus    borderline  . Dyslipidemia   . Fibromyalgia   . GERD (gastroesophageal reflux disease)   . Lung nodules    RIGHT LUNG--LAST EVALUATED BY CHEST CT 02/01/12 - STABLE AND FOLLOW UP PLANNED IN ONE YEAR.  [name] Lyme disease   . Obesity   . OSA (obstructive sleep apnea) 05/10/2017  . Sinusitis   . Stress disorder, acute   . Vitamin D deficiency     reports that she quit smoking about 15 years ago. She has never used smokeless tobacco. She reports that she does not drink alcohol and does not use drugs.   Current Outpatient Medications:  .  albuterol (VENTOLIN HFA) 108 (90 Base) MCG/ACT inhaler, INHALE 2 PUFFS INTO THE LUNGS EVERY 4 HOURS AS NEEDED FOR WHEEIZNG, Disp: 18 g, Rfl: 3 .  ARMOUR THYROID 90 MG tablet, Take 90 mg by mouth daily., Disp: , Rfl:  .  diclofenac (VOLTAREN) 75 MG EC tablet, Take 1 tablet (75 mg total) by mouth 2 (two) times daily., Disp: 30 tablet, Rfl: 0 .  escitalopram (LEXAPRO) 20 MG tablet, TAKE 1 TABLET (20 MG TOTAL) BY MOUTH DAILY. SCHEDULE PHYSICAL EXAM, Disp: 90 tablet, Rfl: 1 .  estradiol (VIVELLE-DOT) 0.1 MG/24HR patch, Place  1 patch onto the skin 2 (two) times a week., Disp: , Rfl:  .  hydrochlorothiazide (HYDRODIURIL) 25 MG tablet, TAKE 1 TABLET BY MOUTH EVERY DAY (TAKE WITH LOSARTAN), Disp: 90 tablet, Rfl: 1 .  Insulin Pen Needle (BD PEN NEEDLE MICRO U/F) 32G X 6 MM MISC, Use to inject Saxenda once weekly, Disp: 50 each, Rfl: 0 .  levothyroxine (SYNTHROID) 25 MCG tablet, Take 25 mcg by mouth daily., Disp: , Rfl:  .  Liraglutide -Weight Management (SAXENDA) 18 MG/3ML SOPN, Inject 0.1 mLs (0.6 mg total) into the skin once a week., Disp: 3 mL, Rfl: 11 .  losartan (COZAAR) 100 MG tablet, TAKE 1 TABLET BY MOUTH EVERY DAY (TAKE WITH HCTZ), Disp: 90 tablet, Rfl: 1 .  montelukast (SINGULAIR) 10 MG tablet, Take 1 tablet (10 mg total) by mouth at bedtime., Disp: 90 tablet, Rfl: 3 .  progesterone (PROMETRIUM) 100 MG capsule, Take 1 tablet by mouth daily x 3 weeks then on stop on the fourth week.  repeat, Disp: , Rfl:  .  valACYclovir (VALTREX) 1000 MG tablet, Take 1,000 mg by mouth 3 (three) times daily., Disp: , Rfl:    Observations/Objective: Blood pressure 124/64, pulse 70, temperature (!) 97.5 F (36.4 C), resp. rate 20, height 5' 4.49" (1.638 m), weight 219 lb 8 oz (99.6 kg), SpO2 98 %.  Physical Exam Constitutional:      General: She is not in acute distress.    Appearance: Normal appearance. She is well-developed. She is not ill-appearing or toxic-appearing.  HENT:     Head: Normocephalic.     Right Ear: Hearing, tympanic membrane, ear canal and external ear normal. Tympanic membrane is not erythematous, retracted or bulging.     Left Ear: Hearing, tympanic membrane, ear canal and external ear normal. Tympanic membrane is not erythematous, retracted or bulging.     Nose: No mucosal edema or rhinorrhea.     Right Sinus: No maxillary sinus tenderness or frontal sinus tenderness.     Left Sinus: No maxillary sinus tenderness or frontal sinus tenderness.     Mouth/Throat:     Pharynx: Uvula midline.  Eyes:      General: Lids are normal. Lids are everted, no foreign bodies appreciated.     Conjunctiva/sclera: Conjunctivae normal.     Pupils: Pupils are equal, round, and reactive to light.  Neck:     Thyroid: No thyroid mass or thyromegaly.     Vascular: No carotid bruit.     Trachea: Trachea normal.  Cardiovascular:     Rate and Rhythm: Normal rate and regular rhythm.     Pulses: Normal pulses.     Heart sounds: Normal heart sounds, S1 normal and S2 normal. No murmur heard.  No friction rub. No gallop.   Pulmonary:     Effort: Pulmonary effort is normal. No tachypnea or respiratory distress.     Breath sounds: Normal breath  sounds. No decreased breath sounds, wheezing, rhonchi or rales.  Abdominal:     General: Bowel sounds are normal.     Palpations: Abdomen is soft.     Tenderness: There is no abdominal tenderness.  Musculoskeletal:     Cervical back: Normal range of motion and neck supple.     Right knee: Swelling and effusion present. No erythema. Decreased range of motion. Tenderness present over the medial joint line and lateral joint line. No MCL, LCL, ACL, PCL or patellar tendon tenderness. No LCL laxity, MCL laxity, ACL laxity or PCL laxity. Abnormal meniscus. Normal patellar mobility.     Instability Tests: Anterior drawer test negative. Posterior drawer test negative. Anterior Lachman test negative. Medial McMurray test positive and lateral McMurray test positive.     Left knee: Normal.  Skin:    General: Skin is warm and dry.     Findings: No rash.  Neurological:     Mental Status: She is alert.  Psychiatric:        Mood and Affect: Mood is not anxious or depressed.        Speech: Speech normal.        Behavior: Behavior normal. Behavior is cooperative.        Thought Content: Thought content normal.        Judgment: Judgment normal.      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  Will  start  tramadol for pain, use crutches and brace, ice and refer to [facility] for likely steroid injection and possible MRI.  Class 2 obesity due to excess calories without serious comorbidity with body mass index (BMI) of 37.0 to 37.9 in adult Increase Saxenda to 3 times weekly.  Encouraged exercise, weight loss, healthy eating habits.     Eliezer Lofts, MD

## 2019-12-21 ENCOUNTER — Other Ambulatory Visit: Payer: Self-pay

## 2019-12-21 ENCOUNTER — Encounter: Payer: Self-pay | Admitting: Surgical

## 2019-12-21 ENCOUNTER — Ambulatory Visit (INDEPENDENT_AMBULATORY_CARE_PROVIDER_SITE_OTHER): Payer: BC Managed Care – PPO | Admitting: Surgical

## 2019-12-21 ENCOUNTER — Telehealth: Payer: Self-pay | Admitting: Orthopedic Surgery

## 2019-12-21 DIAGNOSIS — M25561 Pain in right knee: Secondary | ICD-10-CM | POA: Diagnosis not present

## 2019-12-21 DIAGNOSIS — M25461 Effusion, right knee: Secondary | ICD-10-CM

## 2019-12-21 NOTE — Telephone Encounter (Signed)
Pt called stating [facility] told her she needs to get authorization from her insurance company and pt would like a CB with an update once we have done this   [phone]

## 2019-12-23 MED ORDER — DIAZEPAM 5 MG PO TABS
5.0000 mg | ORAL_TABLET | Freq: Once | ORAL | 0 refills | Status: AC
Start: 2019-12-23 — End: 2019-12-23

## 2019-12-23 NOTE — Progress Notes (Signed)
Office Visit Note   Patient: Jaime Chapman           Date of Birth: [birth year]           MRN: [id number] Visit Date: 12/21/2019 Requested by: Jinny Sanders, MD [place],  [place] [address] PCP: Jinny Sanders, MD  Subjective: Chief Complaint  Patient presents with  . Right Knee - Pain    HPI: Jaime Chapman is a 59 y.o. female who presents to the office complaining of right knee pain. Patient complains of right knee pain over the last 5 to 6 weeks. Her pain is worsening. She denies any acute injury leading to onset of pain. Localizes the majority of her pain to the medial aspect of the knee. She states this knee pain is significantly worse than her former shoulder pain which is causing her distress. Denies any groin pain or radicular pain. She does have to use a cane on occasion. She notes left-sided lateral hip pain that she feels is from compensating for her altered gait. She saw her primary care physician who ordered x-rays that were done on 11/30/2019 that revealed moderate medial compartment arthritis with some mild degenerative changes in the lateral and patellofemoral compartments. She does note that the knee gives out on her on occasion when she walks and feels like it gets stuck and locked up. This locking last for several minutes before she is able to get the motion back in her knee. She has tried taking Tylenol, Aleve, tramadol, using ice and heat without any significant relief. Pain is waking her up at night and she has to sleep on her couch. She has no history of previous injections..                ROS: All systems reviewed are negative as they relate to the chief complaint within the history of present illness.  Patient denies fevers or chills.  Assessment & Plan: Visit Diagnoses:  1. Acute pain of right knee   2. Effusion, right knee     Plan: Patient is a 59 year old female presents complaining of severe knee pain for the last 5 to 6 weeks. Pain  onset was fairly acute over the course of a day but without any specific injury. She has medial sided pain and notes on and off swelling. She does have a small effusion on exam today. She has severe joint line tenderness particularly over the medial joint line. She has locking symptoms that she reports. Though she does have degenerative changes present on her right knee radiographs from her primary care physician, her pain seems out of proportion to the radiographic findings. Plan to order right knee MRI for further evaluation. Toradol injection was administered today and patient tolerated the procedure well. Hopefully this will provide some relief in the meantime between now and the MRI scan. Follow-up after MRI to review results.  Follow-Up Instructions: No follow-ups on file.   Orders:  Orders Placed This Encounter  Procedures  . MR Knee Right w/o contrast   No orders of the defined types were placed in this encounter.     Procedures: Large Joint Inj: R knee on 12/23/2019 10:54 AM Indications: diagnostic evaluation, joint swelling and pain Details: 18 G 1.5 in needle, superolateral approach  Arthrogram: No  Medications: 5 mL lidocaine 1 %; 2 mL bupivacaine 0.25 %; 2 mL lidocaine 1 % (Additionally, 1 mL of Toradol) Outcome: tolerated well, no immediate complications Procedure, treatment  alternatives, risks and benefits explained, specific risks discussed. Consent was given by the patient. Immediately prior to procedure a time out was called to verify the correct patient, procedure, equipment, support staff and site/side marked as required. Patient was prepped and draped in the usual sterile fashion.       Clinical Data: No additional findings.  Objective: Vital Signs: There were no vitals taken for this visit.  Physical Exam:  Constitutional: Patient appears well-developed HEENT:  Head: Normocephalic Eyes:EOM are normal Neck: Normal range of motion Cardiovascular: Normal  rate Pulmonary/chest: Effort normal Neurologic: Patient is alert Skin: Skin is warm Psychiatric: Patient has normal mood and affect  Ortho Exam: Ortho exam demonstrates right knee with small effusion. 0 degrees of extension and flexion is severely limited by pain. She has tenderness over the medial and lateral joint lines, particularly over the medial joint line that is quite severe. Ligamentous exam is limited by patient's pain. She has no calf tenderness on exam. Negative Homans' sign. No significant pain with hip range of motion. No tenderness over the patella, quadricep tendon, patellar tendon.  Specialty Comments:  No specialty comments available.  Imaging: No results found.   PMFS History: Patient Active Problem List   Diagnosis Date Noted  . Acute pain of right knee 11/30/2019  . Class 2 obesity due to excess calories without serious comorbidity with body mass index (BMI) of 37.0 to 37.9 in adult 09/14/2019  . Excessive sweating 01/09/2019  . OSA (obstructive sleep apnea) 05/10/2017  . Fatigue 04/05/2017  . Snoring 04/05/2017  . Pre-syncope 03/10/2017  . Abnormal CT of the chest 08/31/2016  . Panic attacks 04/09/2016  . Major depression, recurrent ([place]) 05/08/2015  . Essential hypertension 07/26/2014  . Chest pressure 07/26/2014  . Constipation - functional 11/20/2013  . Hypothyroidism 11/16/2013  . H/O ventral hernia repair 05/01/2012  . Pulmonary nodules 01/26/2012  . Prediabetes 03/04/2011  . Fatty liver disease, nonalcoholic 93/81/0175  . Generalized anxiety disorder 10/21/2010  . VENTRAL HERNIA-twice recurrent 09/08/2009  . Vitamin D deficiency 12/27/2008  . Hyperlipidemia 05/22/2007  . Moderate persistent asthma 05/22/2007  . DEGENERATIVE DISC DISEASE 05/22/2007  . Fibromyalgia 05/22/2007  . Allergic rhinitis 04/14/2007  . GERD 04/14/2007   Past Medical History:  Diagnosis Date  . Allergic rhinitis   . Anxiety   . Asthma   . DDD (degenerative disc  disease)    also has spinal arthritis  . Depression   . Diabetes mellitus    borderline  . Dyslipidemia   . Fibromyalgia   . GERD (gastroesophageal reflux disease)   . Lung nodules    RIGHT LUNG--LAST EVALUATED BY CHEST CT 02/01/12 - STABLE AND FOLLOW UP PLANNED IN ONE YEAR.  [name] Lyme disease   . Obesity   . OSA (obstructive sleep apnea) 05/10/2017  . Sinusitis   . Stress disorder, acute   . Vitamin D deficiency     Family History  Problem Relation Age of Onset  . Diabetes Mother   . Heart disease Mother 10  . Arthritis Mother   . Lung cancer Maternal Grandmother   . Cancer Maternal Grandmother        lung  . Colon cancer Neg Hx     Past Surgical History:  Procedure Laterality Date  . [place]   x 2   . ENDOMETRIAL ABLATION  2009  . HERNIA REPAIR  04/14/12   ventral hernia repair  . Laparoscopic ventral hernia repair w/ mesh  08/2009  Dr. Grandville Silos, [place]  . LAPAROSCOPY  2002  . TUBAL LIGATION    . VENTRAL HERNIA REPAIR  09/2006   Dr. Grandville Silos  . VENTRAL HERNIA REPAIR N/A 04/14/2012   Procedure: Laparoscopic Repair of  Ventral Hernia;  Surgeon: Pedro Earls, MD;  Location: [facility];  Service: General;  Laterality: N/A;  Repair of Recurrent Ventral Hernia   Social History   Occupational History  . Occupation: Corporate treasurer  Tobacco Use  . Smoking status: Former Smoker    Quit date: 03/01/2004    Years since quitting: 15.8  . Smokeless tobacco: Never Used  Vaping Use  . Vaping Use: Never used  Substance and Sexual Activity  . Alcohol use: No    Alcohol/week: 1.0 standard drink    Types: 1 Standard drinks or equivalent per week  . Drug use: No    Comment: remote majijuana  . Sexual activity: Yes    Partners: Male

## 2019-12-24 ENCOUNTER — Telehealth: Payer: Self-pay | Admitting: Orthopedic Surgery

## 2019-12-24 NOTE — Telephone Encounter (Signed)
See below

## 2019-12-24 NOTE — Telephone Encounter (Signed)
Pt called stating she needs authorization for her MRI to get scheduled; she would like to have this solved today if possible so she can schedule her MRI for 12/25/19  [phone]

## 2019-12-24 NOTE — Telephone Encounter (Signed)
Sent teams message about this to you as well. Can you please help with this?

## 2019-12-25 ENCOUNTER — Ambulatory Visit
Admission: RE | Admit: 2019-12-25 | Discharge: 2019-12-25 | Disposition: A | Discharge status: Home / Self Care | Payer: BC Managed Care – PPO | Source: Ambulatory Visit | Attending: Surgical | Admitting: Surgical

## 2019-12-25 DIAGNOSIS — M25561 Pain in right knee: Secondary | ICD-10-CM

## 2019-12-25 DIAGNOSIS — M7121 Synovial cyst of popliteal space [Baker], right knee: Secondary | ICD-10-CM | POA: Diagnosis not present

## 2019-12-25 DIAGNOSIS — M1711 Unilateral primary osteoarthritis, right knee: Secondary | ICD-10-CM | POA: Diagnosis not present

## 2019-12-25 DIAGNOSIS — M25461 Effusion, right knee: Secondary | ICD-10-CM | POA: Diagnosis not present

## 2019-12-25 NOTE — Telephone Encounter (Signed)
I took care of this first thing this a.m. when I saw the teams message, she is approved and scheduled for later today.

## 2020-01-03 ENCOUNTER — Ambulatory Visit (INDEPENDENT_AMBULATORY_CARE_PROVIDER_SITE_OTHER): Payer: BC Managed Care – PPO | Admitting: Orthopedic Surgery

## 2020-01-03 ENCOUNTER — Other Ambulatory Visit: Payer: Self-pay

## 2020-01-03 DIAGNOSIS — M1711 Unilateral primary osteoarthritis, right knee: Secondary | ICD-10-CM

## 2020-01-03 DIAGNOSIS — M25461 Effusion, right knee: Secondary | ICD-10-CM

## 2020-01-05 ENCOUNTER — Encounter: Payer: Self-pay | Admitting: Surgical

## 2020-01-05 ENCOUNTER — Encounter: Payer: Self-pay | Admitting: Orthopedic Surgery

## 2020-01-05 DIAGNOSIS — M1711 Unilateral primary osteoarthritis, right knee: Secondary | ICD-10-CM

## 2020-01-05 MED ORDER — BUPIVACAINE HCL 0.25 % IJ SOLN
2.0000 mL | INTRAMUSCULAR | Status: AC | PRN
  Administered 2019-12-23: 2 mL via INTRA_ARTICULAR

## 2020-01-05 MED ORDER — LIDOCAINE HCL 1 % IJ SOLN
2.0000 mL | INTRAMUSCULAR | Status: AC | PRN
  Administered 2019-12-23: 2 mL

## 2020-01-05 MED ORDER — LIDOCAINE HCL 1 % IJ SOLN
5.0000 mL | INTRAMUSCULAR | Status: AC | PRN
  Administered 2019-12-23: 5 mL

## 2020-01-05 NOTE — Progress Notes (Signed)
Office Visit Note   Patient: Jaime Chapman           Date of Birth: 05-Feb-1961           MRN: [id number] Visit Date: 01/03/2020 Requested by: Jinny Sanders, MD [place],  [place] [id number] PCP: Jinny Sanders, MD  Subjective: Chief Complaint  Patient presents with  . Right Shoulder - Follow-up    Scan review    HPI: Jaime Chapman is a 59 y.o. female who presents to the office complaining of right knee pain.  She returns to discuss MRI results.  MRI revealed tricompartmental degenerative changes, worst in the medial compartment without any ligamentous injury.  Some degenerative changes of the medial meniscus without any discrete tear.  Moderate joint effusion with mild to moderate Baker's cyst.  She denies any mechanical symptoms currently.  Leg does want to occasionally give out on her.  She had about 25% relief from the Toradol injection.  She denies any groin pain or radicular pain.  She does recall that her right leg fell into a hole about 1 week before symptom onset but aside from that she denies any specific injuries..                ROS: All systems reviewed are negative as they relate to the chief complaint within the history of present illness.  Patient denies fevers or chills.  Assessment & Plan: Visit Diagnoses:  1. Effusion, right knee   2. Unilateral primary osteoarthritis, right knee     Plan: Patient is a 59 year old female presents complaining of right knee pain.  MRI was obtained and results are detailed as above in HPI.  She does have degenerative changes that are worse in the medial compartment.  Nothing that is arthroscopically treatable.  Discussed options available patient.  She had decent relief from her Toradol injection, plan to administer cortisone injection to the right knee today.  Patient tolerated the procedure well.  Follow-up as needed if symptoms do not improve.  Follow-Up Instructions: No follow-ups on file.   Orders:  No  orders of the defined types were placed in this encounter.  No orders of the defined types were placed in this encounter.     Procedures: Large Joint Inj: R knee on 01/05/2020 11:34 AM Indications: diagnostic evaluation, joint swelling and pain Details: 18 G 1.5 in needle, superolateral approach  Arthrogram: No  Medications: 5 mL lidocaine 1 %; 40 mg methylPREDNISolone acetate 40 MG/ML; 4 mL bupivacaine 0.25 % Outcome: tolerated well, no immediate complications Procedure, treatment alternatives, risks and benefits explained, specific risks discussed. Consent was given by the patient. Immediately prior to procedure a time out was called to verify the correct patient, procedure, equipment, support staff and site/side marked as required. Patient was prepped and draped in the usual sterile fashion.       Clinical Data: No additional findings.  Objective: Vital Signs: There were no vitals taken for this visit.  Physical Exam:  Constitutional: Patient appears well-developed HEENT:  Head: Normocephalic Eyes:EOM are normal Neck: Normal range of motion Cardiovascular: Normal rate Pulmonary/chest: Effort normal Neurologic: Patient is alert Skin: Skin is warm Psychiatric: Patient has normal mood and affect  Ortho Exam: Ortho exam demonstrates right knee with positive effusion.  Tenderness over the medial lateral joint lines, more so over the medial joint line.  Extensor mechanism intact, able to perform straight leg raise against resistance.  No pain with hip range  of motion.  No tenderness over the patella, quadriceps tendon, patellar tendon.  0 degrees of extension with greater than 90 degrees of flexion.  Negative straight leg raise  Specialty Comments:  No specialty comments available.  Imaging: No results found.   PMFS History: Patient Active Problem List   Diagnosis Date Noted  . Acute pain of right knee 11/30/2019  . Class 2 obesity due to excess calories without  serious comorbidity with body mass index (BMI) of 37.0 to 37.9 in adult 09/14/2019  . Excessive sweating 01/09/2019  . OSA (obstructive sleep apnea) 05/10/2017  . Fatigue 04/05/2017  . Snoring 04/05/2017  . Pre-syncope 03/10/2017  . Abnormal CT of the chest 08/31/2016  . Panic attacks 04/09/2016  . Major depression, recurrent ([place]) 05/08/2015  . Essential hypertension 07/26/2014  . Chest pressure 07/26/2014  . Constipation - functional 11/20/2013  . Hypothyroidism 11/16/2013  . H/O ventral hernia repair 05/01/2012  . Pulmonary nodules 01/26/2012  . Prediabetes 03/04/2011  . Fatty liver disease, nonalcoholic 83/66/2947  . Generalized anxiety disorder 10/21/2010  . VENTRAL HERNIA-twice recurrent 09/08/2009  . Vitamin D deficiency 12/27/2008  . Hyperlipidemia 05/22/2007  . Moderate persistent asthma 05/22/2007  . DEGENERATIVE DISC DISEASE 05/22/2007  . Fibromyalgia 05/22/2007  . Allergic rhinitis 04/14/2007  . GERD 04/14/2007   Past Medical History:  Diagnosis Date  . Allergic rhinitis   . Anxiety   . Asthma   . DDD (degenerative disc disease)    also has spinal arthritis  . Depression   . Diabetes mellitus    borderline  . Dyslipidemia   . Fibromyalgia   . GERD (gastroesophageal reflux disease)   . Lung nodules    RIGHT LUNG--LAST EVALUATED BY CHEST CT 02/01/12 - STABLE AND FOLLOW UP PLANNED IN ONE YEAR.  [name] Lyme disease   . Obesity   . OSA (obstructive sleep apnea) 05/10/2017  . Sinusitis   . Stress disorder, acute   . Vitamin D deficiency     Family History  Problem Relation Age of Onset  . Diabetes Mother   . Heart disease Mother 33  . Arthritis Mother   . Lung cancer Maternal Grandmother   . Cancer Maternal Grandmother        lung  . Colon cancer Neg Hx     Past Surgical History:  Procedure Laterality Date  . [place]   x 2   . ENDOMETRIAL ABLATION  2009  . HERNIA REPAIR  04/14/12   ventral hernia repair  . Laparoscopic ventral  hernia repair w/ mesh  08/2009   Dr. Grandville Silos, [place]  . LAPAROSCOPY  2002  . TUBAL LIGATION    . VENTRAL HERNIA REPAIR  09/2006   Dr. Grandville Silos  . VENTRAL HERNIA REPAIR N/A 04/14/2012   Procedure: Laparoscopic Repair of  Ventral Hernia;  Surgeon: Pedro Earls, MD;  Location: [facility];  Service: General;  Laterality: N/A;  Repair of Recurrent Ventral Hernia   Social History   Occupational History  . Occupation: Corporate treasurer  Tobacco Use  . Smoking status: Former Smoker    Quit date: 03/01/2004    Years since quitting: 15.8  . Smokeless tobacco: Never Used  Vaping Use  . Vaping Use: Never used  Substance and Sexual Activity  . Alcohol use: No    Alcohol/week: 1.0 standard drink    Types: 1 Standard drinks or equivalent per week  . Drug use: No    Comment: remote majijuana  . Sexual activity:  Yes    Partners: Male

## 2020-01-06 ENCOUNTER — Encounter: Payer: Self-pay | Admitting: Orthopedic Surgery

## 2020-01-06 MED ORDER — METHYLPREDNISOLONE ACETATE 40 MG/ML IJ SUSP
40.0000 mg | INTRAMUSCULAR | Status: AC | PRN
  Administered 2020-01-05: 40 mg via INTRA_ARTICULAR

## 2020-01-06 MED ORDER — BUPIVACAINE HCL 0.25 % IJ SOLN
4.0000 mL | INTRAMUSCULAR | Status: AC | PRN
  Administered 2020-01-05: 4 mL via INTRA_ARTICULAR

## 2020-01-06 MED ORDER — LIDOCAINE HCL 1 % IJ SOLN
5.0000 mL | INTRAMUSCULAR | Status: AC | PRN
  Administered 2020-01-05: 5 mL

## 2020-02-07 ENCOUNTER — Telehealth: Payer: Self-pay | Admitting: *Deleted

## 2020-02-07 NOTE — Telephone Encounter (Signed)
Received fax from CoverMyMeds stating the PA for Saxenda was about to expire.  They have started a renewal PA which I completed online.  Sent to [facility] for review.  Can take up to 72 business hours for a decision.

## 2020-02-14 NOTE — Telephone Encounter (Addendum)
PA denied stating pt has not lost at least 4% of their starting body weight. Appeal information is available on the fax.

## 2020-03-11 ENCOUNTER — Telehealth (INDEPENDENT_AMBULATORY_CARE_PROVIDER_SITE_OTHER): Payer: 59 | Admitting: Family Medicine

## 2020-03-11 ENCOUNTER — Encounter: Payer: Self-pay | Admitting: Family Medicine

## 2020-03-11 VITALS — BP 128/85 | Wt 225.0 lb

## 2020-03-11 DIAGNOSIS — F988 Other specified behavioral and emotional disorders with onset usually occurring in childhood and adolescence: Secondary | ICD-10-CM

## 2020-03-11 DIAGNOSIS — F411 Generalized anxiety disorder: Secondary | ICD-10-CM

## 2020-03-11 DIAGNOSIS — F410 Panic disorder [episodic paroxysmal anxiety] without agoraphobia: Secondary | ICD-10-CM

## 2020-03-11 MED ORDER — HYDROXYZINE HCL 10 MG PO TABS
10.0000 mg | ORAL_TABLET | Freq: Three times a day (TID) | ORAL | 0 refills | Status: DC | PRN
Start: 2020-03-11 — End: 2020-06-03

## 2020-03-11 MED ORDER — VENLAFAXINE HCL ER 37.5 MG PO CP24: 37.5000 mg | ORAL_CAPSULE | Freq: Every day | ORAL | 3 refills | Status: DC

## 2020-03-11 NOTE — Progress Notes (Signed)
Spoke with Jaime Chapman and advised her that I am going to need her to come by office to sign a records release in order to request records from [facility].  Patient will drop by office this afternoon to sign release of information form.

## 2020-03-11 NOTE — Assessment & Plan Note (Signed)
Trail of atarax prn.. if not effective will reconsider alprazolam or clonazepam.

## 2020-03-11 NOTE — Patient Instructions (Addendum)
Start new medication for anxiety (venlafaxine)  Can use atarax as needed for panic attacks. Call to restart counseling, Jonelle Sports.  Call to make appt... in 4 weeks for follow up okay to be virtual.

## 2020-03-11 NOTE — Progress Notes (Signed)
VIRTUAL VISIT Due to national recommendations of social distancing due to [place] 19, a virtual visit is felt to be most appropriate for this patient at this time.   I connected with the patient on 03/11/20 at  1:20 PM EST by virtual telehealth platform and verified that I am speaking with the correct person using two identifiers.   I discussed the limitations, risks, security and privacy concerns of performing an evaluation and management service by  virtual telehealth platform and the availability of in person appointments. I also discussed with the patient that there may be a patient responsible charge related to this service. The patient expressed understanding and agreed to proceed.  Patient location: Home Provider Location: [facility] Participants: Eliezer Lofts and Theodosia Paling   Chief Complaint  Patient presents with  . Medication Problem    Patient states Lexapro not helping anxiety, had a panic attack last week and noticed them coming more frequently. She states she has used xanax 0.5mg  in the past and it helped with the panic attacks. She does not think she is depressed anymore.     History of Present Illness:  60 year old female patient with poor control of GAD.   She reports Lexapro is not longer helping her anxiety. She tapered off lexapro in last month.. she has been off for 1 week. She reports it has been worsening in last 2-3 months. She has noted increase in frequency of panic attacks. Occurring now. Worsening concentration, anxiety off and on during the day. Poor sleep at night, trouble falling asleep, waking 2-3 times a night. Denies depression, sadness, no anhedonia, she has tended to isolate herself though.  She is feeling very overwhelmed.   She is not working, is at home.  In past xanax or klonopin.  Cymbalta cause SE   ADD Dx [facility].  COVID 19 screen No recent travel or known exposure to COVID19 The patient denies  respiratory symptoms of COVID 19 at this time.  The importance of social distancing was discussed today.   Review of Systems  Constitutional: Negative for chills and fever.  HENT: Negative for congestion and ear pain.   Eyes: Negative for pain and redness.  Respiratory: Negative for cough and shortness of breath.   Cardiovascular: Negative for chest pain, palpitations and leg swelling.  Gastrointestinal: Negative for abdominal pain, blood in stool, constipation, diarrhea, nausea and vomiting.  Genitourinary: Negative for dysuria.  Musculoskeletal: Negative for falls and myalgias.  Skin: Negative for rash.  Neurological: Negative for dizziness.  Psychiatric/Behavioral: Negative for depression. The patient is not nervous/anxious.       Past Medical History:  Diagnosis Date  . Allergic rhinitis   . Anxiety   . Asthma   . DDD (degenerative disc disease)    also has spinal arthritis  . Depression   . Diabetes mellitus    borderline  . Dyslipidemia   . Fibromyalgia   . GERD (gastroesophageal reflux disease)   . Lung nodules    RIGHT LUNG--LAST EVALUATED BY CHEST CT 02/01/12 - STABLE AND FOLLOW UP PLANNED IN ONE YEAR.  [name] Lyme disease   . Obesity   . OSA (obstructive sleep apnea) 05/10/2017  . Sinusitis   . Stress disorder, acute   . Vitamin D deficiency     reports that she quit smoking about 16 years ago. She has never used smokeless tobacco. She reports that she does not drink alcohol and does not use drugs.   Current  Outpatient Medications:  .  albuterol (VENTOLIN HFA) 108 (90 Base) MCG/ACT inhaler, INHALE 2 PUFFS INTO THE LUNGS EVERY 4 HOURS AS NEEDED FOR WHEEIZNG, Disp: 18 g, Rfl: 3 .  ARMOUR THYROID 90 MG tablet, Take 90 mg by mouth daily., Disp: , Rfl:  .  diclofenac (VOLTAREN) 75 MG EC tablet, Take 1 tablet (75 mg total) by mouth 2 (two) times daily., Disp: 30 tablet, Rfl: 0 .  escitalopram (LEXAPRO) 20 MG tablet, TAKE 1 TABLET (20 MG TOTAL) BY MOUTH DAILY. SCHEDULE  PHYSICAL EXAM, Disp: 90 tablet, Rfl: 1 .  estradiol (VIVELLE-DOT) 0.1 MG/24HR patch, Place 1 patch onto the skin 2 (two) times a week., Disp: , Rfl:  .  hydrochlorothiazide (HYDRODIURIL) 25 MG tablet, TAKE 1 TABLET BY MOUTH EVERY DAY (TAKE WITH LOSARTAN), Disp: 90 tablet, Rfl: 1 .  Insulin Pen Needle (BD PEN NEEDLE MICRO U/F) 32G X 6 MM MISC, Use to inject Saxenda once weekly, Disp: 50 each, Rfl: 0 .  levothyroxine (SYNTHROID) 25 MCG tablet, Take 25 mcg by mouth daily., Disp: , Rfl:  .  Liraglutide -Weight Management (SAXENDA) 18 MG/3ML SOPN, Inject 0.6 mg into the skin 3 (three) times a week., Disp: 3 mL, Rfl: 11 .  losartan (COZAAR) 100 MG tablet, TAKE 1 TABLET BY MOUTH EVERY DAY (TAKE WITH HCTZ), Disp: 90 tablet, Rfl: 1 .  montelukast (SINGULAIR) 10 MG tablet, Take 1 tablet (10 mg total) by mouth at bedtime., Disp: 90 tablet, Rfl: 3 .  progesterone (PROMETRIUM) 100 MG capsule, Take 1 tablet by mouth daily x 3 weeks then on stop on the fourth week.  repeat, Disp: , Rfl:  .  valACYclovir (VALTREX) 1000 MG tablet, Take 1,000 mg by mouth 3 (three) times daily., Disp: , Rfl:    Observations/Objective: Blood pressure 128/85, weight 225 lb (102.1 kg).  Physical Exam  Physical Exam Constitutional:      General: The patient is not in acute distress. Pulmonary:     Effort: Pulmonary effort is normal. No respiratory distress.  Neurological:     Mental Status: The patient is alert and oriented to person, place, and time.  Psychiatric:        Mood and Affect: Mood normal.        Behavior: Behavior normal.   Assessment and Plan Generalized anxiety disorder Acute worsening of chronic issue Stay off Lexapro.. change to venlafaxine. Use atarax as needed for panic attack. Restart behavorial counseling. Close follow up in 4 weeks.   Panic attacks Trail of atarax prn.. if not effective will reconsider alprazolam or clonazepam.   Meds ordered this encounter  Medications  . hydrOXYzine  (ATARAX/VISTARIL) 10 MG tablet    Sig: Take 1 tablet (10 mg total) by mouth 3 (three) times daily as needed for anxiety (panic attack).    Dispense:  30 tablet    Refill:  0  . venlafaxine XR (EFFEXOR-XR) 37.5 MG 24 hr capsule    Sig: Take 1 capsule (37.5 mg total) by mouth daily with breakfast. 1 capsule daily x 1 week then increase to 2 cap daily    Dispense:  60 capsule    Refill:  3      I discussed the assessment and treatment plan with the patient. The patient was provided an opportunity to ask questions and all were answered. The patient agreed with the plan and demonstrated an understanding of the instructions.   The patient was advised to call back or seek an in-person evaluation if the symptoms  worsen or if the condition fails to improve as anticipated.     Eliezer Lofts, MD

## 2020-03-11 NOTE — Assessment & Plan Note (Signed)
Acute worsening of chronic issue Stay off Lexapro.. change to venlafaxine. Use atarax as needed for panic attack. Restart behavorial counseling. Close follow up in 4 weeks.

## 2020-03-14 ENCOUNTER — Ambulatory Visit (INDEPENDENT_AMBULATORY_CARE_PROVIDER_SITE_OTHER): Payer: 59

## 2020-03-14 ENCOUNTER — Other Ambulatory Visit: Payer: Self-pay

## 2020-03-14 ENCOUNTER — Ambulatory Visit (INDEPENDENT_AMBULATORY_CARE_PROVIDER_SITE_OTHER): Payer: 59 | Admitting: Surgical

## 2020-03-14 ENCOUNTER — Telehealth: Payer: Self-pay

## 2020-03-14 DIAGNOSIS — M25551 Pain in right hip: Secondary | ICD-10-CM

## 2020-03-14 DIAGNOSIS — M1711 Unilateral primary osteoarthritis, right knee: Secondary | ICD-10-CM

## 2020-03-14 NOTE — Telephone Encounter (Signed)
Can we get auth for right knee gel injection?

## 2020-03-15 ENCOUNTER — Encounter: Payer: Self-pay | Admitting: Surgical

## 2020-03-15 DIAGNOSIS — M1711 Unilateral primary osteoarthritis, right knee: Secondary | ICD-10-CM

## 2020-03-15 DIAGNOSIS — M25551 Pain in right hip: Secondary | ICD-10-CM

## 2020-03-15 MED ORDER — BUPIVACAINE HCL 0.25 % IJ SOLN
4.0000 mL | INTRAMUSCULAR | Status: AC | PRN
  Administered 2020-03-15: 4 mL via INTRA_ARTICULAR

## 2020-03-15 MED ORDER — METHYLPREDNISOLONE ACETATE 40 MG/ML IJ SUSP
40.0000 mg | INTRAMUSCULAR | Status: AC | PRN
  Administered 2020-03-15: 40 mg via INTRA_ARTICULAR

## 2020-03-15 MED ORDER — LIDOCAINE HCL 1 % IJ SOLN
5.0000 mL | INTRAMUSCULAR | Status: AC | PRN
  Administered 2020-03-15: 5 mL

## 2020-03-15 NOTE — Progress Notes (Signed)
Office Visit Note   Patient: Jaime Chapman           Date of Birth: 03/07/1960           MRN: [id number] Visit Date: 03/14/2020 Requested by: Jinny Sanders, MD [place],  [place] [id number] PCP: Jinny Sanders, MD  Subjective: Chief Complaint  Patient presents with  . Right Knee - Pain    HPI: Jaime Chapman is a 60 y.o. female who presents to the office complaining of right knee and right hip pain.  Patient has history of right knee osteoarthritis.  She complains of recurrent pain and swelling.  Last injection on 01/03/2020 that provided good relief until about 2 weeks ago.  She notes mainly medial sided right knee pain with some anterior patellofemoral pain as well.  She wakes with pain in her right knee 7 out of 7 nights of the week.  She is able to walk several aisles in the grocery store before she has to take a break due to knee pain.  She notes painful right lateral hip pain as well.  She denies any injury.  She notes the pain has been worsening over the last several months.  It is now gotten to the point where she cannot lay on her right side without significant pain.  Denies any severe groin pain.  Denies any numbness/tingling.  She does have a history of low back pain.  She is currently out of work and is filing for disability.  She is prediabetic.              ROS: All systems reviewed are negative as they relate to the chief complaint within the history of present illness.  Patient denies fevers or chills.  Assessment & Plan: Visit Diagnoses:  1. Unilateral primary osteoarthritis, right knee   2. Pain in right hip   3. Greater trochanteric pain syndrome of right lower extremity     Plan: Patient is a 60 year old female who presents complaint of right knee and right hip pain.  She has history of right knee osteoarthritis and requests a follow-up injection today.  Last injection 01/03/2020 that provided good relief until about 2 weeks ago.  Additionally she  notes right hip pain has been worsening.  All of her hip pain is lateral and bothers her when she tries to sleep.  Impression is greater trochanteric pain syndrome of the right hip.  Administer injection to the right knee.  Patient tolerated the procedure well.  Plan to preapproved patient for gel injection of the right knee as well.  Discussed the risks and benefits of right total knee arthroplasty as well as the recovery timeline and what surgery entails.  We will discuss this further at her next appointment as well but patient understands she is likely heading for knee replacement in the next year or 2.  Additionally, offered injection of the right trochanter.  She agreed and an ultrasound-guided injection into the right trochanteric bursa was administered.  Patient tolerated the procedure well.  Plan for patient to follow-up as needed if her right knee pain returns.  Plan to try gel injection at that point.  This patient is diagnosed with osteoarthritis of the knee(s).    Radiographs show evidence of joint space narrowing, osteophytes, subchondral sclerosis and/or subchondral cysts.  This patient has knee pain which interferes with functional and activities of daily living.    This patient has experienced inadequate response, adverse effects  and/or intolerance with conservative treatments such as acetaminophen, NSAIDS, topical creams, physical therapy or regular exercise, knee bracing and/or weight loss.   This patient has experienced inadequate response or has a contraindication to intra articular steroid injections for at least 3 months.   This patient is not scheduled to have a total knee replacement within 6 months of starting treatment with viscosupplementation.   Follow-Up Instructions: No follow-ups on file.   Orders:  Orders Placed This Encounter  Procedures  . XR HIP UNILAT W OR W/O PELVIS 2-3 VIEWS RIGHT   No orders of the defined types were placed in this encounter.      Procedures: Large Joint Inj: R knee on 03/15/2020 4:39 PM Indications: diagnostic evaluation, joint swelling and pain Details: 18 G 1.5 in needle, superolateral approach  Arthrogram: No  Medications: 5 mL lidocaine 1 %; 40 mg methylPREDNISolone acetate 40 MG/ML; 4 mL bupivacaine 0.25 % Outcome: tolerated well, no immediate complications Procedure, treatment alternatives, risks and benefits explained, specific risks discussed. Consent was given by the patient. Immediately prior to procedure a time out was called to verify the correct patient, procedure, equipment, support staff and site/side marked as required. Patient was prepped and draped in the usual sterile fashion.   Large Joint Inj: R greater trochanter on 03/15/2020 4:39 PM Indications: pain and diagnostic evaluation Details: 18 G 3.5 in needle, ultrasound-guided lateral approach  Arthrogram: No  Medications: 5 mL lidocaine 1 %; 4 mL bupivacaine 0.25 %; 40 mg methylPREDNISolone acetate 40 MG/ML Outcome: tolerated well, no immediate complications Procedure, treatment alternatives, risks and benefits explained, specific risks discussed. Consent was given by the patient. Immediately prior to procedure a time out was called to verify the correct patient, procedure, equipment, support staff and site/side marked as required. Patient was prepped and draped in the usual sterile fashion.       Clinical Data: No additional findings.  Objective: Vital Signs: There were no vitals taken for this visit.  Physical Exam:  Constitutional: Patient appears well-developed HEENT:  Head: Normocephalic Eyes:EOM are normal Neck: Normal range of motion Cardiovascular: Normal rate Pulmonary/chest: Effort normal Neurologic: Patient is alert Skin: Skin is warm Psychiatric: Patient has normal mood and affect  Ortho Exam: Ortho exam demonstrates right knee with no effusion.  Tenderness over the medial joint line primarily.  Pain with cytocide  motion of the patella.  Mild tenderness over the lateral joint line as well.  No calf tenderness.  Negative Homans' sign.  Able to extend to 0 degrees and flex to greater than 90 degrees.  No pain with hip range of motion but she does have point tenderness over the right greater trochanter that is not present on the contralateral side.  Negative straight leg raise.  Negative social exam.  Specialty Comments:  No specialty comments available.  Imaging: No results found.   PMFS History: Patient Active Problem List   Diagnosis Date Noted  . 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 Acute pain of right knee 11/30/2019  . Class 2 obesity due to excess calories without serious comorbidity with body mass index (BMI) of 37.0 to 37.9 in adult 09/14/2019  . Excessive sweating 01/09/2019  . OSA (obstructive sleep apnea) 05/10/2017  . Fatigue 04/05/2017  . Snoring 04/05/2017  . Pre-syncope 03/10/2017  . Abnormal CT of the chest 08/31/2016  . Panic attacks 04/09/2016  . Major depression, recurrent ([place]) 05/08/2015  . Essential hypertension 07/26/2014  . Chest pressure 07/26/2014  . Constipation -  functional 11/20/2013  . Hypothyroidism 11/16/2013  . H/O ventral hernia repair 05/01/2012  . Pulmonary nodules 01/26/2012  . Prediabetes 03/04/2011  . Fatty liver disease, nonalcoholic 25/42/7062  . Generalized anxiety disorder 10/21/2010  . VENTRAL HERNIA-twice recurrent 09/08/2009  . Vitamin D deficiency 12/27/2008  . Hyperlipidemia 05/22/2007  . Moderate persistent asthma 05/22/2007  . DEGENERATIVE DISC DISEASE 05/22/2007  . Fibromyalgia 05/22/2007  . Allergic rhinitis 04/14/2007  . GERD 04/14/2007   Past Medical History:  Diagnosis Date  . Allergic rhinitis   . Anxiety   . Asthma   . DDD (degenerative disc disease)    also has spinal arthritis  . Depression   . Diabetes mellitus    borderline  . Dyslipidemia   . Fibromyalgia   . GERD (gastroesophageal  reflux disease)   . Lung nodules    RIGHT LUNG--LAST EVALUATED BY CHEST CT 02/01/12 - STABLE AND FOLLOW UP PLANNED IN ONE YEAR.  [name] Lyme disease   . Obesity   . OSA (obstructive sleep apnea) 05/10/2017  . Sinusitis   . Stress disorder, acute   . Vitamin D deficiency     Family History  Problem Relation Age of Onset  . Diabetes Mother   . Heart disease Mother 83  . Arthritis Mother   . Lung cancer Maternal Grandmother   . Cancer Maternal Grandmother        lung  . Colon cancer Neg Hx     Past Surgical History:  Procedure Laterality Date  . [place]   x 2   . ENDOMETRIAL ABLATION  2009  . HERNIA REPAIR  04/14/12   ventral hernia repair  . Laparoscopic ventral hernia repair w/ mesh  08/2009   Dr. Grandville Silos, [place]  . LAPAROSCOPY  2002  . TUBAL LIGATION    . VENTRAL HERNIA REPAIR  09/2006   Dr. Grandville Silos  . VENTRAL HERNIA REPAIR N/A 04/14/2012   Procedure: Laparoscopic Repair of  Ventral Hernia;  Surgeon: Pedro Earls, MD;  Location: [facility];  Service: General;  Laterality: N/A;  Repair of Recurrent Ventral Hernia   Social History   Occupational History  . Occupation: Corporate treasurer  Tobacco Use  . Smoking status: Former Smoker    Quit date: 03/01/2004    Years since quitting: 16.0  . Smokeless tobacco: Never Used  Vaping Use  . Vaping Use: Never used  Substance and Sexual Activity  . Alcohol use: No    Alcohol/week: 1.0 standard drink    Types: 1 Standard drinks or equivalent per week  . Drug use: No    Comment: remote majijuana  . Sexual activity: Yes    Partners: Male

## 2020-03-19 NOTE — Telephone Encounter (Signed)
Noted  

## 2020-03-20 ENCOUNTER — Telehealth: Payer: Self-pay

## 2020-03-20 ENCOUNTER — Other Ambulatory Visit: Payer: Self-pay | Admitting: Internal Medicine

## 2020-03-20 ENCOUNTER — Telehealth (INDEPENDENT_AMBULATORY_CARE_PROVIDER_SITE_OTHER): Payer: 59 | Admitting: Internal Medicine

## 2020-03-20 ENCOUNTER — Other Ambulatory Visit: Payer: Self-pay

## 2020-03-20 ENCOUNTER — Other Ambulatory Visit (INDEPENDENT_AMBULATORY_CARE_PROVIDER_SITE_OTHER): Payer: 59

## 2020-03-20 DIAGNOSIS — R0981 Nasal congestion: Secondary | ICD-10-CM

## 2020-03-20 DIAGNOSIS — R059 Cough, unspecified: Secondary | ICD-10-CM

## 2020-03-20 DIAGNOSIS — J029 Acute pharyngitis, unspecified: Secondary | ICD-10-CM | POA: Diagnosis not present

## 2020-03-20 DIAGNOSIS — R519 Headache, unspecified: Secondary | ICD-10-CM

## 2020-03-20 DIAGNOSIS — U071 COVID-19: Secondary | ICD-10-CM | POA: Diagnosis not present

## 2020-03-20 LAB — POC INFLUENZA A&B (BINAX/QUICKVUE)
Influenza A, POC: NEGATIVE
Influenza B, POC: NEGATIVE

## 2020-03-20 MED ORDER — PREDNISONE 10 MG PO TABS: ORAL_TABLET | ORAL | 0 refills | Status: DC

## 2020-03-20 MED ORDER — PROMETHAZINE-DM 6.25-15 MG/5ML PO SYRP: 5.0000 mL | ORAL_SOLUTION | Freq: Four times a day (QID) | ORAL | 0 refills | Status: DC | PRN

## 2020-03-20 NOTE — Telephone Encounter (Signed)
Will discuss at upcoming appt.

## 2020-03-20 NOTE — Telephone Encounter (Signed)
Pt already has video visit with R Baity NP 03/20/20 at 11:15.

## 2020-03-20 NOTE — Telephone Encounter (Signed)
[place] Day - Client TELEPHONE ADVICE RECORD Access[facility]urse Patient Name: Jaime Chapman Gender: Female DOB: 1960-10-17 Age: 60 Y 9 M 19 D Return Phone Number: [phone] (Primary) Address: City/State/Zip: [place] [address] Client [facility] - Client Client Site [facility] - Day Physician Eliezer Lofts - MD Contact Type Call Who Is Calling Patient / Member / Family / Caregiver Call Type Triage / Clinical Relationship To Patient Self Return Phone Number [phone] (Primary) Chief Complaint Headache Reason for Call Symptomatic / Request for [place] states she has muscle aches, chills, cough, stuffy nose and headache. Caller states she does have Covid symptoms and she says wanted to know what is next. Caller states she was not able to get a Covid test buy she has virtual tomorrow with the office. [place] [facility] Translation No Nurse Assessment Nurse: Claiborne Billings, RN, Kim Date/Time Eilene Ghazi Time): 03/19/2020 5:09:07 PM Confirm and document reason for call. If symptomatic, describe symptoms. ---Caller states she has muscle aches, chills, cough, stuffy nose and headache, sxs started Monday morning. Doesn't currently have headache. States she has a Physiological scientist at 11:15am. States she had both doses of the COVID vaccine. Caller also has asthma and has had some SOB, had to use her rescue inhaler x 3 yesterday and once today and it resolved sxs. Does the patient have any new or worsening symptoms? ---Yes Will a triage be completed? ---Yes Related visit to physician within the last 2 weeks? ---Yes Does the PT have any chronic conditions? (i.e. diabetes, asthma, this includes High risk factors for pregnancy, etc.) ---Yes List chronic conditions. ---Asthma, HTN Is this a behavioral health or substance abuse call? ---No Guidelines Guideline Title  Affirmed Question Affirmed Notes Nurse Date/Time (Eastern Time) Asthma Attack Patient sounds very sick or weak to the triager Claiborne Billings, RN, Kim 03/19/2020 5:12:55 PM Disp. Time Eilene Ghazi Time) Disposition Final User PLEASE NOTE: All timestamps contained within this report are represented as [place] Standard Time. CONFIDENTIALTY NOTICE: This fax transmission is intended only for the addressee. It contains information that is legally privileged, confidential or otherwise protected from use or disclosure. If you are not the intended recipient, you are strictly prohibited from reviewing, disclosing, copying using or disseminating any of this information or taking any action in reliance on or regarding this information. If you have received this fax in error, please notify us immediately by telephone so that we can arrange for its return to [place]. Phone: [phone], Toll-Free: [phone], Fax: [phone] Page: 2 of 2 Call Id: [id number] 03/19/2020 5:15:58 PM Go to ED Now (or PCP triage) Yes Claiborne Billings, RN, Max Sane Disagree/Comply Comply Caller Understands Yes PreDisposition InappropriateToAsk Care Advice Given Per Guideline GO TO ED NOW (OR PCP TRIAGE): * IF NO PCP (PRIMARY CARE PROVIDER) SECOND-LEVEL TRIAGE: You need to be seen within the next hour. Go to the [facility] at _____________ [place] as soon as you can. ANOTHER ADULT SHOULD DRIVE: * It is better and safer if another adult drives instead of you. ASTHMA QUICK-RELIEF INHALER (E.G., ALBUTEROL, SALBUTAMOL, XOPENEX): * If you have not taken a neb or inhaler treatment in the past hour, then * Take 4 puffs of your quickrelief inhaler right now. BRING MEDICINES: * Please bring a list of your current medicines when you go to see the doctor. CARE ADVICE given per Asthma Attack (Adult) guideline. Referrals GO TO FACILITY OTHER - SPECIFY

## 2020-03-20 NOTE — Progress Notes (Signed)
Virtual Visit via Video Note  I connected with Jaime Chapman on 03/20/20 at 11:15 AM EST by a video enabled telemedicine application and verified that I am speaking with the correct person using two identifiers.  Location: Patient: Home Provider: Office  Persons participating in this video call: Webb Silversmith, NP and Veneda Melter   I discussed the limitations of evaluation and management by telemedicine and the availability of in person appointments. The patient expressed understanding and agreed to proceed.  History of Present Illness:   Pt reports headache, nasal congestion, sore throat, and cough. This started 4 days ago. He headache has actually improved today. He is blowing clear mucous out of her nose. She is not having difficulty swallowing. The cough is productive of clear mucous. She denies runny nose, ear pain, loss of taste/smell or SOB. She denies fever or chills but has had some body aches. She has tried Ibuprofen and Mucinex with some relief.   Past Medical History:  Diagnosis Date  . Allergic rhinitis   . Anxiety   . Asthma   . DDD (degenerative disc disease)    also has spinal arthritis  . Depression   . Diabetes mellitus    borderline  . Dyslipidemia   . Fibromyalgia   . GERD (gastroesophageal reflux disease)   . Lung nodules    RIGHT LUNG--LAST EVALUATED BY CHEST CT 02/01/12 - STABLE AND FOLLOW UP PLANNED IN ONE YEAR.  [name] Lyme disease   . Obesity   . OSA (obstructive sleep apnea) 05/10/2017  . Sinusitis   . Stress disorder, acute   . Vitamin D deficiency     Current Outpatient Medications  Medication Sig Dispense Refill  . albuterol (VENTOLIN HFA) 108 (90 Base) MCG/ACT inhaler INHALE 2 PUFFS INTO THE LUNGS EVERY 4 HOURS AS NEEDED FOR WHEEIZNG 18 g 3  . ARMOUR THYROID 90 MG tablet Take 90 mg by mouth daily.    . diclofenac (VOLTAREN) 75 MG EC tablet Take 1 tablet (75 mg total) by mouth 2 (two) times daily. 30 tablet 0  . estradiol (VIVELLE-DOT) 0.1  MG/24HR patch Place 1 patch onto the skin 2 (two) times a week.    . hydrochlorothiazide (HYDRODIURIL) 25 MG tablet TAKE 1 TABLET BY MOUTH EVERY DAY (TAKE WITH LOSARTAN) 90 tablet 1  . hydrOXYzine (ATARAX/VISTARIL) 10 MG tablet Take 1 tablet (10 mg total) by mouth 3 (three) times daily as needed for anxiety (panic attack). 30 tablet 0  . Insulin Pen Needle (BD PEN NEEDLE MICRO U/F) 32G X 6 MM MISC Use to inject Saxenda once weekly 50 each 0  . levothyroxine (SYNTHROID) 25 MCG tablet Take 25 mcg by mouth daily.    . Liraglutide -Weight Management (SAXENDA) 18 MG/3ML SOPN Inject 0.6 mg into the skin 3 (three) times a week. 3 mL 11  . losartan (COZAAR) 100 MG tablet TAKE 1 TABLET BY MOUTH EVERY DAY (TAKE WITH HCTZ) 90 tablet 1  . montelukast (SINGULAIR) 10 MG tablet Take 1 tablet (10 mg total) by mouth at bedtime. 90 tablet 3  . progesterone (PROMETRIUM) 100 MG capsule Take 1 tablet by mouth daily x 3 weeks then on stop on the fourth week.  repeat    . valACYclovir (VALTREX) 1000 MG tablet Take 1,000 mg by mouth 3 (three) times daily.    [name] venlafaxine XR (EFFEXOR-XR) 37.5 MG 24 hr capsule Take 1 capsule (37.5 mg total) by mouth daily with breakfast. 1 capsule daily x 1 week then increase to  2 cap daily 60 capsule 3   No current facility-administered medications for this visit.    Allergies  Allergen Reactions  . Progesterone     Stroke-like symptoms  . Codeine     REACTION: nausea,vomiting,dizziness  . Cymbalta [Duloxetine Hcl]     Pt states it caused weight gain    Family History  Problem Relation Age of Onset  . Diabetes Mother   . Heart disease Mother 19  . Arthritis Mother   . Lung cancer Maternal Grandmother   . Cancer Maternal Grandmother        lung  . Colon cancer Neg Hx     Social History   Socioeconomic History  . Marital status: Married    Spouse name: Simona Huh  . Number of children: 2  . Years of education: Not on file  . Highest education level: Not on file   Occupational History  . Occupation: Corporate treasurer  Tobacco Use  . Smoking status: Former Smoker    Quit date: 03/01/2004    Years since quitting: 16.0  . Smokeless tobacco: Never Used  Vaping Use  . Vaping Use: Never used  Substance and Sexual Activity  . Alcohol use: No    Alcohol/week: 1.0 standard drink    Types: 1 Standard drinks or equivalent per week  . Drug use: No    Comment: remote majijuana  . Sexual activity: Yes    Partners: Male  Other Topics Concern  . Not on file  Social History Narrative   2 caffeine drinks daily     Married   2 grown daughters, one is a crack addict and is in jail, she cares for her grandchildren.   Social Determinants of Health   Financial Resource Strain: Not on file  Food Insecurity: Not on file  Transportation Needs: Not on file  Physical Activity: Not on file  Stress: Not on file  Social Connections: Not on file  Intimate Partner Violence: Not on file     Constitutional: Pt reports headache. Denies fever, malaise, fatigue, or abrupt weight changes.  HEENT: Pt reports nasal congestion, sore throat. Denies eye pain, eye redness, ear pain, ringing in the ears, wax buildup, runny nose, bloody nose. Respiratory: Pt reports cough. Denies difficulty breathing, shortness of breath.   Cardiovascular: Denies chest pain, chest tightness, palpitations or swelling in the hands or feet.  Gastrointestinal: Denies abdominal pain, bloating, constipation, diarrhea or blood in the stool.     No other specific complaints in a complete review of systems (except as listed in HPI above).  Observations/Objective:  Wt Readings from Last 3 Encounters:  03/11/20 225 lb (102.1 kg)  12/18/19 219 lb 8 oz (99.6 kg)  11/30/19 220 lb 8 oz (100 kg)    General: Appears unwell but in NAD. HEENT:  Nose: congestion noted ; Throat/Mouth:  Hoarseness noted Pulmonary/Chest: Normal effort. No respiratory distress. Neurological: Alert and oriented.   BMET     Component Value Date/Time   NA 142 09/04/2019 0838   K 3.8 09/04/2019 0838   CL 102 09/04/2019 0838   CO2 30 09/04/2019 0838   GLUCOSE 127 (H) 09/04/2019 0838   BUN 30 (H) 09/04/2019 0838   CREATININE 1.24 (H) 09/04/2019 0838   CALCIUM 9.6 09/04/2019 0838   GFRNONAA >60 05/04/2017 1750   GFRAA >60 05/04/2017 1750    Lipid Panel     Component Value Date/Time   CHOL 198 09/04/2019 0838   TRIG 205.0 (H) 09/04/2019 0838   HDL  43.50 09/04/2019 0838   CHOLHDL 5 09/04/2019 0838   VLDL 41.0 (H) 09/04/2019 0838   LDLCALC 132 (H) 04/20/2018 0856    CBC    Component Value Date/Time   WBC 6.6 01/09/2019 1442   RBC 4.60 01/09/2019 1442   HGB 13.3 01/09/2019 1442   HCT 40.0 01/09/2019 1442   PLT 206.0 01/09/2019 1442   MCV 87.0 01/09/2019 1442   MCH 30.2 05/04/2017 1750   MCHC 33.3 01/09/2019 1442   RDW 13.0 01/09/2019 1442   LYMPHSABS 1.8 01/09/2019 1442   MONOABS 0.5 01/09/2019 1442   EOSABS 0.2 01/09/2019 1442   BASOSABS 0.1 01/09/2019 1442    Hgb A1C Lab Results  Component Value Date   HGBA1C 6.3 09/04/2019        Assessment and Plan:  Acute Headache, Nasal Congestion, Sore Throat, Cough:  Will have her come for [facility] flu and covid test Encouraged rest and fluids Encouraged Tylenol/Ibuprofen as needed for body aches RX for Pred Taper x 6 days RX for Promethazine DM cough syrup Encouraged masking, frequent handwashing, social distancing and self quarantine until results have been reviewed.  Follow Up Instructions:    I discussed the assessment and treatment plan with the patient. The patient was provided an opportunity to ask questions and all were answered. The patient agreed with the plan and demonstrated an understanding of the instructions.   The patient was advised to call back or seek an in-person evaluation if the symptoms worsen or if the condition fails to improve as anticipated.     Webb Silversmith, NP

## 2020-03-22 LAB — SARS-COV-2, NAA 2 DAY TAT

## 2020-03-22 LAB — NOVEL CORONAVIRUS, NAA: SARS-CoV-2, NAA: DETECTED — AB

## 2020-03-23 ENCOUNTER — Encounter: Payer: Self-pay | Admitting: Internal Medicine

## 2020-03-23 NOTE — Patient Instructions (Signed)
COVID-19: What Your Test Results Mean If you test positive for COVID-19 Take steps to protect others regardless of your COVID-19 vaccination status Stay home.  Isolate at home for at least 10 days. Stay in a specific room and away from other people in your home. Get rest and stay hydrated. If you develop symptoms, continue to isolate for at least 10 days after symptoms began and until you do not have a fever without using medications to reduce fever. Stay in touch with your doctor. Contact your doctor as soon as possible if you are an older adult or have underlying medical conditions. Contact your doctor or health department about isolation if you  Are severely ill or have a weakened immune system.  Had a positive test result followed by a negative result.  Test positive for many weeks. If you test negative for COVID-19:  The virus was not detected. If you have symptoms of COVID-19:  You may have received a false negative test result and still might have COVID-19.  Isolate from others. If you do not have symptoms of COVID-19 and you were exposed to a person with COVID-19:  You are likely not infected, but you still may get sick.  Contact your doctor about your symptoms, about follow-up testing, and how long to isolate.  Self-quarantine for 14 days at home after your exposure.  If you are fully vaccinated, you do not need to self quarantine.  Contact your doctor or local health department regarding options to reduce the length of your quarantine. A negative test result does not mean you won't get sick later. [email or web address] 11/27/2019 This information is not intended to replace advice given to you by your health care provider. Make sure you discuss any questions you have with your health care provider. Document Revised: 12/31/2019 Document Reviewed: 12/31/2019 Elsevier Patient Education  2021 [employer].

## 2020-03-24 ENCOUNTER — Encounter (HOSPITAL_COMMUNITY): Payer: Self-pay | Admitting: *Deleted

## 2020-03-24 ENCOUNTER — Emergency Department (HOSPITAL_COMMUNITY): Payer: 59

## 2020-03-24 ENCOUNTER — Emergency Department (HOSPITAL_COMMUNITY)
Admission: EM | Admit: 2020-03-24 | Discharge: 2020-03-25 | Disposition: A | Discharge status: Home / Self Care | Payer: 59 | Attending: Emergency Medicine | Admitting: Emergency Medicine

## 2020-03-24 ENCOUNTER — Encounter: Payer: Self-pay | Admitting: Internal Medicine

## 2020-03-24 ENCOUNTER — Other Ambulatory Visit: Payer: Self-pay

## 2020-03-24 ENCOUNTER — Telehealth: Payer: Self-pay

## 2020-03-24 DIAGNOSIS — J45909 Unspecified asthma, uncomplicated: Secondary | ICD-10-CM | POA: Diagnosis not present

## 2020-03-24 DIAGNOSIS — R0602 Shortness of breath: Secondary | ICD-10-CM | POA: Diagnosis not present

## 2020-03-24 DIAGNOSIS — E039 Hypothyroidism, unspecified: Secondary | ICD-10-CM | POA: Insufficient documentation

## 2020-03-24 DIAGNOSIS — Z79899 Other long term (current) drug therapy: Secondary | ICD-10-CM | POA: Insufficient documentation

## 2020-03-24 DIAGNOSIS — R0789 Other chest pain: Secondary | ICD-10-CM | POA: Diagnosis not present

## 2020-03-24 DIAGNOSIS — Z87891 Personal history of nicotine dependence: Secondary | ICD-10-CM | POA: Insufficient documentation

## 2020-03-24 DIAGNOSIS — I10 Essential (primary) hypertension: Secondary | ICD-10-CM | POA: Insufficient documentation

## 2020-03-24 DIAGNOSIS — U071 COVID-19: Secondary | ICD-10-CM | POA: Diagnosis not present

## 2020-03-24 LAB — COMPREHENSIVE METABOLIC PANEL
ALT: 30 U/L (ref 0–44)
AST: 19 U/L (ref 15–41)
Albumin: 4.1 g/dL (ref 3.5–5.0)
Alkaline Phosphatase: 59 U/L (ref 38–126)
Anion gap: 11 (ref 5–15)
BUN: 26 mg/dL — ABNORMAL HIGH (ref 6–20)
CO2: 23 mmol/L (ref 22–32)
Calcium: 9.9 mg/dL (ref 8.9–10.3)
Chloride: 101 mmol/L (ref 98–111)
Creatinine, Ser: 1.11 mg/dL — ABNORMAL HIGH (ref 0.44–1.00)
GFR, Estimated: 58 mL/min — ABNORMAL LOW (ref >60)
Glucose, Bld: 159 mg/dL — ABNORMAL HIGH (ref 70–99)
Potassium: 3.9 mmol/L (ref 3.5–5.1)
Sodium: 135 mmol/L (ref 135–145)
Total Bilirubin: 0.4 mg/dL (ref 0.3–1.2)
Total Protein: 7.3 g/dL (ref 6.5–8.1)

## 2020-03-24 LAB — CBC WITH DIFFERENTIAL/PLATELET
Abs Immature Granulocytes: 0.12 10*3/uL — ABNORMAL HIGH (ref 0.00–0.07)
Basophils Absolute: 0 10*3/uL (ref 0.0–0.1)
Basophils Relative: 0 %
Eosinophils Absolute: 0 10*3/uL (ref 0.0–0.5)
Eosinophils Relative: 0 %
HCT: 44.3 % (ref 36.0–46.0)
Hemoglobin: 14.3 g/dL (ref 12.0–15.0)
Immature Granulocytes: 1 %
Lymphocytes Relative: 12 %
Lymphs Abs: 1.7 10*3/uL (ref 0.7–4.0)
MCH: 28.8 pg (ref 26.0–34.0)
MCHC: 32.3 g/dL (ref 30.0–36.0)
MCV: 89.3 fL (ref 80.0–100.0)
Monocytes Absolute: 0.6 10*3/uL (ref 0.1–1.0)
Monocytes Relative: 4 %
Neutro Abs: 11.6 10*3/uL — ABNORMAL HIGH (ref 1.7–7.7)
Neutrophils Relative %: 83 %
Platelets: 279 10*3/uL (ref 150–400)
RBC: 4.96 MIL/uL (ref 3.87–5.11)
RDW: 12.2 % (ref 11.5–15.5)
WBC: 14 10*3/uL — ABNORMAL HIGH (ref 4.0–10.5)
nRBC: 0 % (ref 0.0–0.2)

## 2020-03-24 NOTE — Telephone Encounter (Signed)
[facility] Night - Client TELEPHONE ADVICE RECORD Access[facility] Patient Name: Jaime Chapman Gender: Female DOB: 1961/02/07 Age: 60 Y 9 M 22 D Return Phone Number: [phone] (Primary) Address: City/State/Zip: [place]  [address] Client [facility] - Client Client Site [facility] Physician Jaime Chapman - MD Contact Type Call Who Is Calling Patient / Member / Family / Caregiver Call Type Triage / Clinical Relationship To Patient Self Return Phone Number [phone] (Primary) Chief Complaint Headache Reason for Call Symptomatic / Request for [name] states she tested pos for COVID and would like to know if she can have to antibody infusion and will need a referral. Caller states she is currently experiencing a cough, sneezing, body aches and headache. Translation No Nurse Assessment Nurse: Kirk Ruths, RN, Caryl Pina Date/Time Eilene Ghazi Time): 03/22/2020 12:12:26 PM Confirm and document reason for call. If symptomatic, describe symptoms. ---Caller states she tested pos for COVID and would like to know if she can have to antibody infusion and will need a referral. Caller states she is currently experiencing a cough, sneezing, body aches and headache. This all began on Monday, She has asthma, obesity, HTN. Everyone in the house has [place]. Denies fever at this time. Does the patient have any new or worsening symptoms? ---Yes Will a triage be completed? ---Yes Related visit to physician within the last 2 weeks? ---Yes Does the PT have any chronic conditions? (i.e. diabetes, asthma, this includes High risk factors for pregnancy, etc.) ---Yes List chronic conditions. ---asthma, HTN, over weight Is this a behavioral health or substance abuse call? ---No Guidelines Guideline Title Affirmed Question Affirmed Notes Nurse Date/Time ([place] Time) COVID-19 - Diagnosed or  Suspected MILD difficulty breathing (e.g., minimal/no SOB at rest, SOB with walking, pulse <100) Kirk Ruths, RN, Caryl Pina 03/22/2020 12:14:19 PM Disp. Time Eilene Ghazi Time) Disposition Final User PLEASE NOTE: All timestamps contained within this report are represented as [place] Standard Time. CONFIDENTIALTY NOTICE: This fax transmission is intended only for the addressee. It contains information that is legally privileged, confidential or otherwise protected from use or disclosure. If you are not the intended recipient, you are strictly prohibited from reviewing, disclosing, copying using or disseminating any of this information or taking any action in reliance on or regarding this information. If you have received this fax in error, please notify us immediately by telephone so that we can arrange for its return to [place]. Phone: [phone], Toll-Free: [phone], Fax: [phone] Page: 2 of 2 Call Id: [id number] 03/22/2020 12:20:09 PM See HCP within 4 Hours (or PCP triage) Yes Kirk Ruths, RN, Hulan Saas Disagree/Comply Comply Caller Understands Yes PreDisposition Go to ED Care Advice Given Per Guideline SEE HCP (OR PCP TRIAGE) WITHIN 4 HOURS: * IF OFFICE WILL BE OPEN: You need to be seen within the next 3 or 4 hours. Call your doctor (or NP/PA) now or as soon as the office opens. * IF OFFICE WILL BE CLOSED AND PCP SECONDLEVEL TRIAGE REQUIRED: You may need to be seen. Your doctor (or NP/PA) will want to talk with you to decide what's best. I'll page the on-call provider now. If you haven't heard from the provider (or me) within 30 minutes, call again. NOTE: If on-call provider can't be reached, send to [facility] or ED. GENERAL CARE ADVICE FOR COVID-19 SYMPTOMS: * The treatment is the same whether you have COVID-19, influenza or some other respiratory virus. * Cough: Use cough drops. * Feeling dehydrated: Drink extra  liquids. If the air in your home is dry, use a humidifier. * Fever: For fever over 101 F  (38.3 C), take acetaminophen every 4 to 6 hours (Adults 650 mg) OR ibuprofen every 6 to 8 hours (Adults 400 mg). Before taking any medicine, read all the instructions on the package. Do not take aspirin unless your doctor has prescribed it for you. * Muscle aches, headache, and other pains: Often this comes and goes with the fever. Take acetaminophen every 4 to 6 hours (Adults 650 mg) OR ibuprofen every 6 to 8 hours (Adults 400 mg). Before taking any medicine, read all the instructions on the package. CALL BACK IF: * You become worse CARE ADVICE given per COVID-19 - DIAGNOSED OR SUSPECTED (Adult) guideline. Referrals [facility]

## 2020-03-24 NOTE — Telephone Encounter (Signed)
[facility] - Client TELEPHONE ADVICE RECORD [facility] Patient Name: Jaime Chapman Gender: Female DOB: 07/29/1960 Age: 60 Y 9 M 24 D Return Phone Number: [phone] (Primary) Address: City/State/Zip: [place] [address] Client [facility] - Client Client Site [facility] Physician Eliezer Lofts - MD Contact Type Call Who Is Calling Patient / Member / Family / Caregiver Call Type Triage / Clinical Relationship To Patient Self Return Phone Number [phone] (Primary) Chief Complaint BLOOD PRESSURE HIGH - Diastolic (bottom number) 174 or greater (with symptoms) Reason for Call Symptomatic / Request for Jaime Chapman states she is having high blood pressure all morning, blurred vision and a headache. Jaime Chapman Not Listed Jaime Chapman Translation No Nurse Assessment Nurse: Louretta Shorten, RN, [place] Date/Time Eilene Ghazi Time): 03/24/2020 1:52:47 PM Confirm and document reason for call. If symptomatic, describe symptoms. ---Caller states she tested positive for Covid on Saturday States BP 40 mins ago-174/107. Caller states she has a cough with worsening SOB. Pt has HA with blurred VA. Does the patient have any new or worsening symptoms? ---Yes Will a triage be completed? ---Yes Related visit to physician within the last 2 weeks? ---No Does the PT have any chronic conditions? (i.e. diabetes, asthma, this includes High risk factors for pregnancy, etc.) ---Yes List chronic conditions. ---HTN Is this a behavioral health or substance abuse call? ---No Guidelines Guideline Title Affirmed Question Affirmed Notes Nurse Date/Time ([place] Time) COVID-19 - Diagnosed or Suspected MODERATE difficulty breathing (e.g., speaks in phrases, SOB even at rest, pulse 100-120) Louretta Shorten, [place], [place] 03/24/2020 1:54:37 PM Disp. Time Eilene Ghazi Time) Disposition Final User 03/24/2020 1:51:04 PM  Send to Urgent Jeannette Corpus, Tiffany 03/24/2020 1:57:33 PM Go to ED Now Yes Louretta Shorten, RN, [place] PLEASE NOTE: All timestamps contained within this report are represented as [place] Standard Time. CONFIDENTIALTY NOTICE: This fax transmission is intended only for the addressee. It contains information that is legally privileged, confidential or otherwise protected from use or disclosure. If you are not the intended recipient, you are strictly prohibited from reviewing, disclosing, copying using or disseminating any of this information or taking any action in reliance on or regarding this information. If you have received this fax in error, please notify us immediately by telephone so that we can arrange for its return to [place]. Phone: [phone], Toll-Free: [phone], Fax: [phone] Page: 2 of 2 Call Id: [id number] [place] Disagree/Comply Comply Caller Understands Yes PreDisposition Call Doctor Care Advice Given Per Guideline GO TO ED NOW: * You need to be seen in the Emergency Department. * Leave now. Drive carefully. * Go to the ED at ___________ [place] given per COVID-19 - DIAGNOSED OR SUSPECTED (Adult) guideline. ANOTHER ADULT SHOULD DRIVE: * It is better and safer if another adult drives instead of you. Comments User: [place], [place], RN Date/Time Eilene Ghazi Time): 03/24/2020 1:56:10 PM Current Vitals: HR 89, O2 sat 97%, and 153/132 Referrals GO TO FACILITY OTHER - SPECIFY

## 2020-03-24 NOTE — Telephone Encounter (Signed)
Pt is currently in the [facility] ER

## 2020-03-24 NOTE — ED Triage Notes (Signed)
Pt reports onset of covid symptoms 1 week ago, received + test result on Saturday. Pt still has cough, sob but also reports her BP being elevated today despite taking her medicines. 145/88 at triage. No resp distress is noted.

## 2020-03-25 NOTE — Telephone Encounter (Signed)
Noted  

## 2020-03-25 NOTE — ED Provider Notes (Signed)
[facility] Provider Note   CSN: [id number] Arrival date & time: 03/24/20  1524     History Chief Complaint  Patient presents with  . Shortness of Breath  . Hypertension    Jaime Chapman is a 60 y.o. female.  HPI   Patient with significant medical history of diabetes, hypertension, anxiety, asthma, GERD presents with chief complaint of Covid symptoms and hypertension.  Patient endorses that she started to have URI-like symptoms starting on the 17th, was seen at her primary care provider who started her on steroids and tested for Covid.  Her test came back positive and she has been having symptoms since.  She tried to getting a referral to the infusion clinic but was unable to.  Patient endorses that she had felt short of breath on exertion, denies actual chest pain or pleuritic chest pain, has no history of DVTs or Pes, denies leg swelling, is not on estradiol patch.  She states this feels similar to previous asthma exacerbations.  Patient also endorses that she noted her blood pressure has been elevated stated got as high as 172 systolic with a at home blood pressure monitor, she endorses some visual changes and a headache but this has since resolved.  Patient denies any alleviating factors.  Patient denies chest pain, abdominal pain, nausea, vomiting, diarrhea, worsening pedal edema.  Past Medical History:  Diagnosis Date  . Allergic rhinitis   . Anxiety   . Asthma   . DDD (degenerative disc disease)    also has spinal arthritis  . Depression   . Diabetes mellitus    borderline  . Dyslipidemia   . Fibromyalgia   . GERD (gastroesophageal reflux disease)   . Lung nodules    RIGHT LUNG--LAST EVALUATED BY CHEST CT 02/01/12 - STABLE AND FOLLOW UP PLANNED IN ONE YEAR.  [name]. Lyme disease   . Obesity   . OSA (obstructive sleep apnea) 05/10/2017  . Sinusitis   . Stress disorder, acute   . Vitamin D deficiency     Patient Active Problem List    Diagnosis Date Noted  . 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 Acute pain of right knee 11/30/2019  . Class 2 obesity due to excess calories without serious comorbidity with body mass index (BMI) of 37.0 to 37.9 in adult 09/14/2019  . Excessive sweating 01/09/2019  . OSA (obstructive sleep apnea) 05/10/2017  . Fatigue 04/05/2017  . Snoring 04/05/2017  . Pre-syncope 03/10/2017  . Abnormal CT of the chest 08/31/2016  . Panic attacks 04/09/2016  . Major depression, recurrent (HCC) 05/08/2015  . Essential hypertension 07/26/2014  . Chest pressure 07/26/2014  . Constipation - functional 11/20/2013  . Hypothyroidism 11/16/2013  . H/O ventral hernia repair 05/01/2012  . Pulmonary nodules 01/26/2012  . Prediabetes 03/04/2011  . Fatty liver disease, nonalcoholic 10/28/2010  . Generalized anxiety disorder 10/21/2010  . VENTRAL HERNIA-twice recurrent 09/08/2009  . Vitamin D deficiency 12/27/2008  . Hyperlipidemia 05/22/2007  . Moderate persistent asthma 05/22/2007  . DEGENERATIVE DISC DISEASE 05/22/2007  . Fibromyalgia 05/22/2007  . Allergic rhinitis 04/14/2007  . GERD 04/14/2007    Past Surgical History:  Procedure Laterality Date  . CESAREAN SECTION  1982, 1985   x 2   . ENDOMETRIAL ABLATION  2009  . HERNIA REPAIR  04/14/12   ventral hernia repair  . Laparoscopic ventral hernia repair w/ mesh  08/2009   Dr. Janee Mornhompson, CCS  . LAPAROSCOPY  2002  . TUBAL  LIGATION    . VENTRAL HERNIA REPAIR  09/2006   Dr. Grandville Silos  . VENTRAL HERNIA REPAIR N/A 04/14/2012   Procedure: Laparoscopic Repair of  Ventral Hernia;  Surgeon: Pedro Earls, MD;  Location: [facility];  Service: General;  Laterality: N/A;  Repair of Recurrent Ventral Hernia     OB History   No obstetric history on file.     Family History  Problem Relation Age of Onset  . Diabetes Mother   . Heart disease Mother 26  . Arthritis Mother   . Lung cancer Maternal Grandmother   . Cancer Maternal  Grandmother        lung  . Colon cancer Neg Hx     Social History   Tobacco Use  . Smoking status: Former Smoker    Quit date: 03/01/2004    Years since quitting: 16.0  . Smokeless tobacco: Never Used  Vaping Use  . Vaping Use: Never used  Substance Use Topics  . Alcohol use: No    Alcohol/week: 1.0 standard drink    Types: 1 Standard drinks or equivalent per week  . Drug use: No    Comment: remote [place] Medications Prior to Admission medications   Medication Sig Start Date End Date Taking? Authorizing Provider  albuterol (VENTOLIN HFA) 108 (90 Base) MCG/ACT inhaler INHALE 2 PUFFS INTO THE LUNGS EVERY 4 HOURS AS NEEDED FOR [facility] 12/14/18   Parrett, Tammy S, NP  ARMOUR THYROID 90 MG tablet Take 90 mg by mouth daily. 11/22/19   [provider]  diclofenac (VOLTAREN) 75 MG EC tablet Take 1 tablet (75 mg total) by mouth 2 (two) times daily. 11/30/19   Bedsole, [name] E, MD  estradiol (VIVELLE-DOT) 0.1 MG/24HR patch Place 1 patch onto the skin 2 (two) times a week. 08/24/19   [provider]  hydrochlorothiazide (HYDRODIURIL) 25 MG tablet TAKE 1 TABLET BY MOUTH EVERY DAY (TAKE WITH LOSARTAN) 12/12/19   Bedsole, [name] E, MD  hydrOXYzine (ATARAX/VISTARIL) 10 MG tablet Take 1 tablet (10 mg total) by mouth 3 (three) times daily as needed for anxiety (panic attack). 03/11/20   Bedsole, [name] E, MD  Insulin Pen Needle (BD PEN NEEDLE MICRO U/F) 32G X 6 MM MISC Use to inject Saxenda once weekly 10/24/19   Bedsole, [name] E, MD  levothyroxine (SYNTHROID) 25 MCG tablet Take 25 mcg by mouth daily. 12/12/19   [provider]  Liraglutide -Weight Management (SAXENDA) 18 MG/3ML SOPN Inject 0.6 mg into the skin 3 (three) times a week. 12/19/19   Bedsole, [name] E, MD  losartan (COZAAR) 100 MG tablet TAKE 1 TABLET BY MOUTH EVERY DAY (TAKE WITH HCTZ) 12/12/19   Bedsole, [name] E, MD  montelukast (SINGULAIR) 10 MG tablet Take 1 tablet (10 mg total) by mouth at bedtime. 12/14/18    Parrett, Fonnie Mu, NP  predniSONE (DELTASONE) 10 MG tablet Take 3 tabs on days 1-2, take 2 tabs on days 3-4, take 1 tab on days 5-6 03/20/20   Jearld Fenton, NP  progesterone (PROMETRIUM) 100 MG capsule Take 1 tablet by mouth daily x 3 weeks then on stop on the fourth week.  repeat    [provider]  promethazine-dextromethorphan (PROMETHAZINE-DM) 6.25-15 MG/5ML syrup Take 5 mLs by mouth 4 (four) times daily as needed. 03/20/20   Jearld Fenton, NP  valACYclovir (VALTREX) 1000 MG tablet Take 1,000 mg by mouth 3 (three) times daily. 11/15/19   [provider]  venlafaxine XR (EFFEXOR-XR)  37.5 MG 24 hr capsule Take 1 capsule (37.5 mg total) by mouth daily with breakfast. 1 capsule daily x 1 week then increase to 2 cap daily 03/11/20   Jinny Sanders, MD    Allergies    Progesterone, Codeine, and Cymbalta [duloxetine hcl]  Review of Systems   Review of Systems  Constitutional: Negative for chills and fever.  HENT: Positive for congestion.   Respiratory: Positive for cough, chest tightness and shortness of breath.   Cardiovascular: Negative for chest pain.  Gastrointestinal: Negative for abdominal pain, diarrhea, nausea and vomiting.  Genitourinary: Negative for enuresis.  Musculoskeletal: Negative for back pain.  Skin: Negative for rash.  Neurological: Positive for headaches. Negative for dizziness.  Hematological: Does not bruise/bleed easily.    Physical Exam Updated Vital Signs BP 127/69   Pulse 66   Temp (!) 97.5 F (36.4 C) (Oral)   Resp 17   Ht 5\' 4"  (1.626 m)   Wt 101.6 kg   SpO2 97%   BMI 38.45 kg/m   Physical Exam Vitals and nursing note reviewed.  Constitutional:      General: She is not in acute distress.    Appearance: Normal appearance. She is not ill-appearing or diaphoretic.  HENT:     Head: Normocephalic and atraumatic.     Nose: No congestion or rhinorrhea.     Mouth/Throat:     Mouth: Mucous membranes are moist.  Eyes:     General: No  visual field deficit or scleral icterus.    Extraocular Movements: Extraocular movements intact.     Conjunctiva/sclera: Conjunctivae normal.     Pupils: Pupils are equal, round, and reactive to light.  Cardiovascular:     Rate and Rhythm: Normal rate and regular rhythm.     Pulses: Normal pulses.     Heart sounds: No murmur heard. No friction rub. No gallop.   Pulmonary:     Effort: Pulmonary effort is normal. No respiratory distress.     Breath sounds: No wheezing, rhonchi or rales.  Abdominal:     Palpations: Abdomen is soft.     Tenderness: There is no abdominal tenderness.  Musculoskeletal:     Right lower leg: No edema.     Left lower leg: No edema.     Comments: Patient is moving all 4 extremities at difficulty.  Skin:    General: Skin is warm and dry.  Neurological:     General: No focal deficit present.     Mental Status: She is alert.     GCS: GCS eye subscore is 4. GCS verbal subscore is 5. GCS motor subscore is 6.     Cranial Nerves: Cranial nerves are intact. No cranial nerve deficit or facial asymmetry.     Sensory: Sensation is intact. No sensory deficit.     Motor: Motor function is intact. No weakness or pronator drift.     Coordination: Coordination is intact. Romberg sign negative.  Psychiatric:        Mood and Affect: Mood normal.     ED Results / Procedures / Treatments   Labs (all labs ordered are listed, but only abnormal results are displayed) Labs Reviewed  COMPREHENSIVE METABOLIC PANEL - Abnormal; Notable for the following components:      Result Value   Glucose, Bld 159 (*)    BUN 26 (*)    Creatinine, Ser 1.11 (*)    GFR, Estimated 58 (*)    All other components within normal limits  CBC  WITH DIFFERENTIAL/PLATELET - Abnormal; Notable for the following components:   WBC 14.0 (*)    Neutro Abs 11.6 (*)    Abs Immature Granulocytes 0.12 (*)    All other components within normal limits    EKG EKG Interpretation  Date/Time:  Monday March 24 2020 15:40:01 EST Ventricular Rate:  84 PR Interval:  150 QRS Duration: 68 QT Interval:  328 QTC Calculation: 387 R Axis:   71 Text Interpretation: Normal sinus rhythm Septal infarct , age undetermined Abnormal ECG When compared with ECG of 02/10/2016, No significant change was found Confirmed by Delora Fuel ([id number]) on 03/25/2020 12:03:42 AM   Radiology DG Chest Portable 1 View  Result Date: 03/24/2020 CLINICAL DATA:  Short of breath.  COVID positive EXAM: PORTABLE CHEST 1 VIEW COMPARISON:  08/31/2017 FINDINGS: Heart size normal. Prominent right epicardial fat pad unchanged from prior CT. Vascularity normal. Lungs are clear without infiltrate or effusion. IMPRESSION: No active disease. Electronically Signed   By: Franchot Gallo M.D.   On: 03/24/2020 16:04    Procedures Procedures   Medications Ordered in ED Medications - No data to display  ED Course  I have reviewed the triage vital signs and the nursing notes.  Pertinent labs & imaging results that were available during my care of the patient were reviewed by me and considered in my medical decision making (see chart for details).    MDM Rules/Calculators/A&P                          Patient presents with high blood pressure and cold-like symptoms.  She is alert, does not appear in acute distress, vital signs reassuring.  Triage obtain basic lab work.  CBC shows leukocytosis 14, no signs of anemia, CMP shows no electrolyte abnormalities, does show hyperglycemia 159, BUN of 26, elevated creatinine of 1.11, no anion gap present.  Chest x-ray unremarkable.  EKG sinus without signs of ischemia no ST elevation or depression noted.  Low suspicion for systemic infection as patient is nontoxic-appearing, vital signs reassuring, no obvious source infection noted on exam.  Patient does have noted leukocytosis seen on CBC but I suspect this is secondary due to steroid use.  Low suspicion for pneumonia as lung sounds are clear  bilaterally, x-ray did not reveal any acute findings.  I have low suspicion for PE as patient denies pleuritic chest pain, has no history of PEs or DVTs, vital signs reassuring, no signs of pedal edema noted on exam.  Patient admits that this feels very similar to a asthma exacerbation which I feel more likely as she is Covid positive.  I have low suspicion for hypertensive urgency or emergency as there is no neuro deficits on my exam, no signs of organ damage noted on exam or lab work.  I suspect patient's slightly elevated blood pressure secondary due to steroids.  Low suspicion patient would need  hospitalized due to viral infection or Covid as vital signs reassuring, patient is not in respiratory distress.  Suspect patient's symptoms are secondary due to Covid and steroid use, will refer to the infusion clinic and post Covid care.  Vital signs have remained stable, no indication for hospital admission.  Patient discussed with attending and they agreed with assessment and plan.  Patient given at home care as well strict return precautions.  Patient verbalized that they understood agreed to said plan.     Final Clinical Impression(s) / ED Diagnoses Final diagnoses:  Hypertension, unspecified type  Shortness of breath  COVID    Rx / DC Orders ED Discharge Orders         Ordered    Ambulatory referral for Covid Treatment        03/25/20 0856           Marcello Fennel, PA-C 03/25/20 [place], MD 03/25/20 [phone]

## 2020-03-25 NOTE — ED Notes (Signed)
Discharge paperwork given to pt. Pt agreeable to discharge and understands instructions. Esignature pad not working.

## 2020-03-25 NOTE — Discharge Instructions (Addendum)
You have been seen here for high blood pressure and covid symptoms.  I recommend taking Tylenol for fever control and ibuprofen for pain control please follow dosing on the back of bottle.  I recommend staying hydrated and if you do not an appetite, I recommend soups as this will provide you with fluids and calories.   Please be aware that your blood pressure will be elevated due to steroids, this can also elevate your blood sugar and make you more anxious.  The symptoms will resolve about 5-6 days after the final dose of your steroids.  you are Covid positive you must self quarantine for 5 days starting on symptom onset, if at the end of those 5 days you are feeling better you may return back to school/work, if you continue to have symptoms you must self quarantine for additional 5 days.  I would like you to contact "post Covid care" as they will provide you with information how to manage your Covid symptoms   I sent a referral to the infusion clinic for Covid treatment, if you qualify they will contact you.  Come back to the emergency department if you develop chest pain, shortness of breath, severe abdominal pain, uncontrolled nausea, vomiting, diarrhea.

## 2020-04-01 ENCOUNTER — Telehealth: Payer: Self-pay | Admitting: Nurse Practitioner

## 2020-04-01 NOTE — Telephone Encounter (Signed)
[facility] [phone]) called pt for HFU. Pt states she is feeling much better. Pt reports she will call back or contact PCP if symptoms return or worsen.

## 2020-04-05 ENCOUNTER — Other Ambulatory Visit: Payer: Self-pay | Admitting: Family Medicine

## 2020-04-07 NOTE — Telephone Encounter (Signed)
Received refill request for Venlafaxine XR 37.5 mg.  Pharmacy was requesting 90 day supply. I spoke with Jaime Chapman to see if she was tolerating the 2 capsules daily so we could send in 75 mg capsules.  She states she didn't start the Venlafaxine until about 2 weeks ago due to she got Covid and relied more on the hydroxyzine during Covid.  She did not increase to 2 capsules daily yet because she said she didn't know when she was suppose to increase it but will start 2 capsules now.  Patient was to follow up in 4 weeks but since delay on starting medication when would you like Jaime Chapman to follow up.  I denied 90 day refill at this time given medication is a new start.

## 2020-04-08 NOTE — Telephone Encounter (Signed)
Stevana notified as instructed by telephone.  Appointment scheduled with Dr. Diona Browner 05/06/2020 at 11:40 am to follow up GAD.

## 2020-04-10 ENCOUNTER — Telehealth: Payer: Self-pay

## 2020-04-10 NOTE — Telephone Encounter (Signed)
Submitted VOB for SynviscOne, right knee. 

## 2020-04-21 ENCOUNTER — Telehealth: Payer: Self-pay

## 2020-04-21 NOTE — Telephone Encounter (Signed)
PA required for SynviscOne, right knee. Faxed completed PA form to [facility] at [phone].

## 2020-04-23 ENCOUNTER — Telehealth: Payer: Self-pay

## 2020-04-23 NOTE — Telephone Encounter (Signed)
Received a fax from [facility] stating that PA was misdirected due to the state ([place]) and to fax PA form to [phone]. PA form has been faxed to [facility] at [phone].

## 2020-04-28 ENCOUNTER — Telehealth: Payer: Self-pay

## 2020-04-28 NOTE — Telephone Encounter (Signed)
Received a fax from [facility] stating that additional information is needed.   Faxed office notes to [facility] at [phone] for SynviscOne, right knee.

## 2020-04-30 ENCOUNTER — Telehealth: Payer: Self-pay | Admitting: Physical Medicine and Rehabilitation

## 2020-04-30 ENCOUNTER — Telehealth: Payer: Self-pay

## 2020-04-30 NOTE — Telephone Encounter (Signed)
Approved for SynviscOne, right knee. [place] Patient will be responsible for 20% OOP. Co-pay of $5.00 PA Approval# [id number] Valid 04/21/2020- 07/20/2020  Appt. 05/09/2020 with Dr. Marlou Sa

## 2020-04-30 NOTE — Telephone Encounter (Signed)
Yes.  Thank you.

## 2020-04-30 NOTE — Telephone Encounter (Signed)
[facility] called and patient was approved for procedure code [id number] & [id number] from 04-21-02-22-2022. Auth code [id number]

## 2020-04-30 NOTE — Telephone Encounter (Signed)
Is this one you were working on?

## 2020-04-30 NOTE — Telephone Encounter (Signed)
Appt. Scheduled.

## 2020-05-06 ENCOUNTER — Encounter: Payer: Self-pay | Admitting: Family Medicine

## 2020-05-06 ENCOUNTER — Other Ambulatory Visit: Payer: Self-pay

## 2020-05-06 ENCOUNTER — Ambulatory Visit (INDEPENDENT_AMBULATORY_CARE_PROVIDER_SITE_OTHER): Payer: 59 | Admitting: Family Medicine

## 2020-05-06 VITALS — BP 120/70 | HR 86 | Temp 97.9°F | Ht 64.5 in | Wt 222.2 lb

## 2020-05-06 DIAGNOSIS — F411 Generalized anxiety disorder: Secondary | ICD-10-CM

## 2020-05-06 DIAGNOSIS — F988 Other specified behavioral and emotional disorders with onset usually occurring in childhood and adolescence: Secondary | ICD-10-CM | POA: Diagnosis not present

## 2020-05-06 DIAGNOSIS — F331 Major depressive disorder, recurrent, moderate: Secondary | ICD-10-CM

## 2020-05-06 MED ORDER — VENLAFAXINE HCL ER 75 MG PO CP24: 75.0000 mg | ORAL_CAPSULE | Freq: Every day | ORAL | 5 refills | Status: DC

## 2020-05-06 NOTE — Progress Notes (Signed)
Patient ID: Jaime Chapman, female    DOB: 1960/08/09, 60 y.o.   MRN: [id number]  This visit was conducted in person.  BP 120/70   Pulse 86   Temp 97.9 F (36.6 C) (Temporal)   Ht 5' 4.5" (1.638 m)   Wt 222 lb 4 oz (100.8 kg)   SpO2 98%   BMI 37.56 kg/m    CC:  Chief Complaint  Patient presents with  . Follow-up    GAD    Subjective:   HPI: Jaime Chapman is a 60 y.o. female presenting on 05/06/2020 for Follow-up (GAD)  GAD:  In 03/2020 she reported Lexapro was no longer helping.  She was started on venlafaxine and atarax to use prn panic attacks.  Today she reports since COVID she continues to have significant fatigue. She is sleeping 7 hours at night, waking once at night to urinate.  She feels anxiety is improving overall with the venlafaxine.  She has been on the 75 mg dose for last 3 weeks.  No SE. Occ had to use hydroxyzine.. was effective for panic attacks.  Having issues focusing, doing more than one thing at a time. She feels her ADD is not well controlled. Saw Dr at [facility] 10 years ago. Had extensive testing.  Was on  Methylphenidate.  Records requested.   BP Readings from Last 3 Encounters:  05/06/20 120/70  03/25/20 127/69  03/11/20 128/85   Looking into bariatric surgery... [facility].. has appt next month. Body mass index is 37.56 kg/m.     Wt Readings from Last 3 Encounters:  05/06/20 222 lb 4 oz (100.8 kg)  03/24/20 224 lb (101.6 kg)  03/11/20 225 lb (102.1 kg)     Relevant past medical, surgical, family and social history reviewed and updated as indicated. Interim medical history since our last visit reviewed. Allergies and medications reviewed and updated. Outpatient Medications Prior to Visit  Medication Sig Dispense Refill  . albuterol (VENTOLIN HFA) 108 (90 Base) MCG/ACT inhaler INHALE 2 PUFFS INTO THE LUNGS EVERY 4 HOURS AS NEEDED FOR WHEEIZNG 18 g 3  . ARMOUR THYROID 90 MG tablet Take 90 mg by  mouth daily.    . diclofenac (VOLTAREN) 75 MG EC tablet Take 1 tablet (75 mg total) by mouth 2 (two) times daily. 30 tablet 0  . estradiol (VIVELLE-DOT) 0.1 MG/24HR patch Place 1 patch onto the skin 2 (two) times a week.    . hydrochlorothiazide (HYDRODIURIL) 25 MG tablet TAKE 1 TABLET BY MOUTH EVERY DAY (TAKE WITH LOSARTAN) 90 tablet 1  . hydrOXYzine (ATARAX/VISTARIL) 10 MG tablet Take 1 tablet (10 mg total) by mouth 3 (three) times daily as needed for anxiety (panic attack). 30 tablet 0  . Insulin Pen Needle (BD PEN NEEDLE MICRO U/F) 32G X 6 MM MISC Use to inject Saxenda once weekly 50 each 0  . levothyroxine (SYNTHROID) 25 MCG tablet Take 25 mcg by mouth daily.    . Liraglutide -Weight Management (SAXENDA) 18 MG/3ML SOPN Inject 0.6 mg into the skin 3 (three) times a week. 3 mL 11  . losartan (COZAAR) 100 MG tablet TAKE 1 TABLET BY MOUTH EVERY DAY (TAKE WITH HCTZ) 90 tablet 1  . montelukast (SINGULAIR) 10 MG tablet Take 1 tablet (10 mg total) by mouth at bedtime. 90 tablet 3  . progesterone (PROMETRIUM) 100 MG capsule Take 1 tablet by mouth daily x 3 weeks then on stop on the fourth week.  repeat    .  valACYclovir (VALTREX) 1000 MG tablet Take 1,000 mg by mouth 3 (three) times daily.    [name] venlafaxine XR (EFFEXOR-XR) 37.5 MG 24 hr capsule Take 1 capsule (37.5 mg total) by mouth daily with breakfast. 1 capsule daily x 1 week then increase to 2 cap daily 60 capsule 3  . predniSONE (DELTASONE) 10 MG tablet Take 3 tabs on days 1-2, take 2 tabs on days 3-4, take 1 tab on days 5-6 12 tablet 0  . promethazine-dextromethorphan (PROMETHAZINE-DM) 6.25-15 MG/5ML syrup Take 5 mLs by mouth 4 (four) times daily as needed. 118 mL 0   No facility-administered medications prior to visit.     Per HPI unless specifically indicated in ROS section below Review of Systems  Constitutional: Negative for fatigue and fever.  HENT: Negative for congestion.   Eyes: Negative for pain.  Respiratory: Negative for cough  and shortness of breath.   Cardiovascular: Negative for chest pain, palpitations and leg swelling.  Gastrointestinal: Negative for abdominal pain.  Genitourinary: Negative for dysuria and vaginal bleeding.  Musculoskeletal: Negative for back pain.  Neurological: Negative for syncope, light-headedness and headaches.  Psychiatric/Behavioral: Negative for dysphoric mood.   Objective:  BP 120/70   Pulse 86   Temp 97.9 F (36.6 C) (Temporal)   Ht 5' 4.5" (1.638 m)   Wt 222 lb 4 oz (100.8 kg)   SpO2 98%   BMI 37.56 kg/m   Wt Readings from Last 3 Encounters:  05/06/20 222 lb 4 oz (100.8 kg)  03/24/20 224 lb (101.6 kg)  03/11/20 225 lb (102.1 kg)      Physical Exam Constitutional:      General: She is not in acute distress.    Appearance: Normal appearance. She is well-developed. She is obese. She is not ill-appearing or toxic-appearing.  HENT:     Head: Normocephalic.     Right Ear: Hearing, tympanic membrane, ear canal and external ear normal. Tympanic membrane is not erythematous, retracted or bulging.     Left Ear: Hearing, tympanic membrane, ear canal and external ear normal. Tympanic membrane is not erythematous, retracted or bulging.     Nose: No mucosal edema or rhinorrhea.     Right Sinus: No maxillary sinus tenderness or frontal sinus tenderness.     Left Sinus: No maxillary sinus tenderness or frontal sinus tenderness.     Mouth/Throat:     Pharynx: Uvula midline.  Eyes:     General: Lids are normal. Lids are everted, no foreign bodies appreciated.     Conjunctiva/sclera: Conjunctivae normal.     Pupils: Pupils are equal, round, and reactive to light.  Neck:     Thyroid: No thyroid mass or thyromegaly.     Vascular: No carotid bruit.     Trachea: Trachea normal.  Cardiovascular:     Rate and Rhythm: Normal rate and regular rhythm.     Pulses: Normal pulses.     Heart sounds: Normal heart sounds, S1 normal and S2 normal. No murmur heard. No friction rub. No gallop.    Pulmonary:     Effort: Pulmonary effort is normal. No tachypnea or respiratory distress.     Breath sounds: Normal breath sounds. No decreased breath sounds, wheezing, rhonchi or rales.  Abdominal:     General: Bowel sounds are normal.     Palpations: Abdomen is soft.     Tenderness: There is no abdominal tenderness.  Musculoskeletal:     Cervical back: Normal range of motion and neck supple.  Skin:  General: Skin is warm and dry.     Findings: No rash.  Neurological:     Mental Status: She is alert.  Psychiatric:        Mood and Affect: Mood is not anxious or depressed.        Speech: Speech normal.        Behavior: Behavior normal. Behavior is cooperative.        Thought Content: Thought content normal.        Judgment: Judgment normal.       Results for orders placed or performed during the hospital encounter of 03/24/20  Comprehensive metabolic panel  Result Value Ref Range   Sodium 135 135 - 145 mmol/L   Potassium 3.9 3.5 - 5.1 mmol/L   Chloride 101 98 - 111 mmol/L   CO2 23 22 - 32 mmol/L   Glucose, Bld 159 (H) 70 - 99 mg/dL   BUN 26 (H) 6 - 20 mg/dL   Creatinine, Ser 1.11 (H) 0.44 - 1.00 mg/dL   Calcium 9.9 8.9 - 10.3 mg/dL   Total Protein 7.3 6.5 - 8.1 g/dL   Albumin 4.1 3.5 - 5.0 g/dL   AST 19 15 - 41 U/L   ALT 30 0 - 44 U/L   Alkaline Phosphatase 59 38 - 126 U/L   Total Bilirubin 0.4 0.3 - 1.2 mg/dL   GFR, Estimated 58 (L) >60 mL/min   Anion gap 11 5 - 15  CBC with Differential  Result Value Ref Range   WBC 14.0 (H) 4.0 - 10.5 K/uL   RBC 4.96 3.87 - 5.11 MIL/uL   Hemoglobin 14.3 12.0 - 15.0 g/dL   HCT 44.3 36.0 - 46.0 %   MCV 89.3 80.0 - 100.0 fL   MCH 28.8 26.0 - 34.0 pg   MCHC 32.3 30.0 - 36.0 g/dL   RDW 12.2 11.5 - 15.5 %   Platelets 279 150 - 400 K/uL   nRBC 0.0 0.0 - 0.2 %   Neutrophils Relative % 83 %   Neutro Abs 11.6 (H) 1.7 - 7.7 K/uL   Lymphocytes Relative 12 %   Lymphs Abs 1.7 0.7 - 4.0 K/uL   Monocytes Relative 4 %   Monocytes  Absolute 0.6 0.1 - 1.0 K/uL   Eosinophils Relative 0 %   Eosinophils Absolute 0.0 0.0 - 0.5 K/uL   Basophils Relative 0 %   Basophils Absolute 0.0 0.0 - 0.1 K/uL   Immature Granulocytes 1 %   Abs Immature Granulocytes 0.12 (H) 0.00 - 0.07 K/uL    This visit occurred during the SARS-CoV-2 public health emergency.  Safety protocols were in place, including screening questions prior to the visit, additional usage of staff PPE, and extensive cleaning of exam room while observing appropriate contact time as indicated for disinfecting solutions.   COVID 19 screen:  No recent travel or known exposure to COVID19 The patient denies respiratory symptoms of COVID 19 at this time. The importance of social distancing was discussed today.   Assessment and Plan   GAD./MDD: Continue venlafaxine... may have further improving on this dose in next few weeks.   ADD: We will re-request ADD records to consider starting  ADD med.   Eliezer Lofts, MD

## 2020-05-06 NOTE — Patient Instructions (Signed)
Continue venlafaxine... may have further improving on this dose in next few weeks.  We will re-request ADD records to consider starting  ADD med. Call before next OV to make sure records received.

## 2020-05-09 ENCOUNTER — Telehealth: Payer: Self-pay | Admitting: *Deleted

## 2020-05-09 ENCOUNTER — Ambulatory Visit: Payer: 59 | Admitting: Orthopedic Surgery

## 2020-05-09 DIAGNOSIS — M1711 Unilateral primary osteoarthritis, right knee: Secondary | ICD-10-CM | POA: Diagnosis not present

## 2020-05-09 MED ORDER — METHYLPHENIDATE HCL ER (CD) 10 MG PO CPCR: 10.0000 mg | ORAL_CAPSULE | ORAL | 0 refills | Status: DC

## 2020-05-09 NOTE — Telephone Encounter (Signed)
Jaime Chapman notified that Dr. Diona Browner received and reviewed her records from [facility] and wants to know if she would like to restart Metadate CR 10 mg daily.  Jaime Chapman would like to restart medication.  Please sign order below.

## 2020-05-09 NOTE — Progress Notes (Signed)
   Procedure Note  Patient: Jaime Chapman             Date of Birth: [birth year]           MRN: [id number]             Visit Date: 05/09/2020  Procedures: Visit Diagnoses:  1. Unilateral primary osteoarthritis, right knee     Large Joint Inj: R knee on 05/09/2020 8:56 PM Indications: diagnostic evaluation, joint swelling and pain Details: 18 G 1.5 in needle, superolateral approach  Arthrogram: No  Medications: 5 mL lidocaine 1 %; 48 mg Hylan 48 MG/6ML Outcome: tolerated well, no immediate complications  Patient had 7 weeks of relief from last cortisone injection in January.  She states that her hip feels better following the trochanteric injection as well. Procedure, treatment alternatives, risks and benefits explained, specific risks discussed. Consent was given by the patient. Immediately prior to procedure a time out was called to verify the correct patient, procedure, equipment, support staff and site/side marked as required. Patient was prepped and draped in the usual sterile fashion.

## 2020-05-10 ENCOUNTER — Encounter: Payer: Self-pay | Admitting: Orthopedic Surgery

## 2020-05-10 MED ORDER — HYLAN G-F 20 48 MG/6ML IX SOSY
48.0000 mg | PREFILLED_SYRINGE | INTRA_ARTICULAR | Status: AC | PRN
  Administered 2020-05-09: 48 mg via INTRA_ARTICULAR

## 2020-05-10 MED ORDER — LIDOCAINE HCL 1 % IJ SOLN
5.0000 mL | INTRAMUSCULAR | Status: AC | PRN
  Administered 2020-05-09: 5 mL

## 2020-06-03 ENCOUNTER — Ambulatory Visit (INDEPENDENT_AMBULATORY_CARE_PROVIDER_SITE_OTHER): Payer: 59 | Admitting: Family Medicine

## 2020-06-03 ENCOUNTER — Other Ambulatory Visit: Payer: Self-pay

## 2020-06-03 ENCOUNTER — Encounter: Payer: Self-pay | Admitting: Family Medicine

## 2020-06-03 VITALS — BP 130/82 | HR 81 | Temp 97.3°F | Ht 64.5 in | Wt 222.0 lb

## 2020-06-03 DIAGNOSIS — F411 Generalized anxiety disorder: Secondary | ICD-10-CM

## 2020-06-03 DIAGNOSIS — F331 Major depressive disorder, recurrent, moderate: Secondary | ICD-10-CM

## 2020-06-03 DIAGNOSIS — F988 Other specified behavioral and emotional disorders with onset usually occurring in childhood and adolescence: Secondary | ICD-10-CM | POA: Diagnosis not present

## 2020-06-03 DIAGNOSIS — R7303 Prediabetes: Secondary | ICD-10-CM

## 2020-06-03 LAB — POCT GLYCOSYLATED HEMOGLOBIN (HGB A1C): Hemoglobin A1C: 6.1 % — AB (ref 4.0–5.6)

## 2020-06-03 MED ORDER — METHYLPHENIDATE HCL ER (CD) 10 MG PO CPCR: 10.0000 mg | ORAL_CAPSULE | ORAL | 0 refills | Status: DC

## 2020-06-03 MED ORDER — DICLOFENAC SODIUM 75 MG PO TBEC: 75.0000 mg | DELAYED_RELEASE_TABLET | Freq: Two times a day (BID) | ORAL | 0 refills | Status: DC

## 2020-06-03 NOTE — Assessment & Plan Note (Signed)
Eval with POC A1C today given concerning symptoms that could be due to  Glucose elevations.

## 2020-06-03 NOTE — Patient Instructions (Signed)
Continue current dose of Venlafaxine and Metadate CR for now.

## 2020-06-03 NOTE — Assessment & Plan Note (Signed)
Slightly improvement in low dose methylphenidate, no SE.  She wpuld like to remain on this dose for the next 3 months to evaluate over time.  likely will need increased dose

## 2020-06-03 NOTE — Progress Notes (Signed)
Patient ID: Jaime Chapman, female    DOB: 30-May-1960, 60 y.o.   MRN: [id number]  This visit was conducted in person.  BP 130/82   Pulse 81   Temp (!) 97.3 F (36.3 C) (Temporal)   Ht 5' 4.5" (1.638 m)   Wt 222 lb (100.7 kg)   SpO2 96%   BMI 37.52 kg/m    CC:  Chief Complaint  Patient presents with  . Follow-up    GAD/MDD    Subjective:   HPI: Jaime Chapman is a 60 y.o. female presenting on 06/03/2020 for Follow-up (GAD/MDD)   At last OV on 3/8 we continued the venlafaxine 75 mg daily as she wanted to give it more time to work as she had just started noting improvement. At that [place] she was noting significant concentration issues.  Records for past Dx ADD we obtained.    Reviewed records from [facility]: Was on Metadate CR in past doing well, no red flags for misuse noted.  Today she reports she has continued to have improvement  In mood.. Almost 100 % improvement in mood. Sleeping better.  GAD now 0!  PHQ9 5   Occ SE of nausea... better taking at night.  She has had some improvement in focus on methylphenidate CR 10 mg  Daily.... 10% improvement... since started on 05/09/20 ADD Compliant with meds: benefit from med (ie increase in concentration): change in mood:  improved change in appetite: decreased some Insomnia: no change Tremor: none compliant with behavioral modification: yes  She is currently looking into bariatric surgery.  Wt Readings from Last 3 Encounters:  06/03/20 222 lb (100.7 kg)  05/06/20 222 lb 4 oz (100.8 kg)  03/24/20 224 lb (101.6 kg)   She has prediabetes... but lately had noted increased urination. Fatigue, intermittent blurred vision.  Eye exam last week was good... new prescription ordered. No retinopathy. Lab Results  Component Value Date   HGBA1C 6.3 09/04/2019     Relevant past medical, surgical, family and social history reviewed and updated as indicated. Interim medical history since our last visit  reviewed. Allergies and medications reviewed and updated. Outpatient Medications Prior to Visit  Medication Sig Dispense Refill  . albuterol (VENTOLIN HFA) 108 (90 Base) MCG/ACT inhaler INHALE 2 PUFFS INTO THE LUNGS EVERY 4 HOURS AS NEEDED FOR WHEEIZNG 18 g 3  . ARMOUR THYROID 90 MG tablet Take 90 mg by mouth daily.    . diclofenac (VOLTAREN) 75 MG EC tablet Take 1 tablet (75 mg total) by mouth 2 (two) times daily. 30 tablet 0  . estradiol (VIVELLE-DOT) 0.1 MG/24HR patch Place 1 patch onto the skin 2 (two) times a week.    . hydrochlorothiazide (HYDRODIURIL) 25 MG tablet TAKE 1 TABLET BY MOUTH EVERY DAY (TAKE WITH LOSARTAN) 90 tablet 1  . Insulin Pen Needle (BD PEN NEEDLE MICRO U/F) 32G X 6 MM MISC Use to inject Saxenda once weekly 50 each 0  . levothyroxine (SYNTHROID) 25 MCG tablet Take 25 mcg by mouth daily.    [name] losartan (COZAAR) 100 MG tablet TAKE 1 TABLET BY MOUTH EVERY DAY (TAKE WITH HCTZ) 90 tablet 1  . methylphenidate (METADATE CD) 10 MG CR capsule Take 1 capsule (10 mg total) by mouth every morning. 30 capsule 0  . montelukast (SINGULAIR) 10 MG tablet Take 1 tablet (10 mg total) by mouth at bedtime. 90 tablet 3  . nystatin (MYCOSTATIN) 500000 units TABS tablet Take 1 tablet by mouth 2 (two)  times daily.    . progesterone (PROMETRIUM) 100 MG capsule Take 1 tablet by mouth daily x 3 weeks then on stop on the fourth week.  repeat    . venlafaxine XR (EFFEXOR XR) 75 MG 24 hr capsule Take 1 capsule (75 mg total) by mouth daily with breakfast. 30 capsule 5  . hydrOXYzine (ATARAX/VISTARIL) 10 MG tablet Take 1 tablet (10 mg total) by mouth 3 (three) times daily as needed for anxiety (panic attack). 30 tablet 0  . Liraglutide -Weight Management (SAXENDA) 18 MG/3ML SOPN Inject 0.6 mg into the skin 3 (three) times a week. 3 mL 11  . valACYclovir (VALTREX) 1000 MG tablet Take 1,000 mg by mouth 3 (three) times daily.    [name] venlafaxine XR (EFFEXOR-XR) 37.5 MG 24 hr capsule Take 1 capsule (37.5 mg  total) by mouth daily with breakfast. 1 capsule daily x 1 week then increase to 2 cap daily 60 capsule 3   No facility-administered medications prior to visit.     Per HPI unless specifically indicated in ROS section below Review of Systems  Constitutional: Negative for fatigue and fever.  HENT: Negative for congestion.   Eyes: Negative for pain.  Respiratory: Negative for cough and shortness of breath.   Cardiovascular: Negative for chest pain, palpitations and leg swelling.  Gastrointestinal: Negative for abdominal pain.  Genitourinary: Negative for dysuria and vaginal bleeding.  Musculoskeletal: Negative for back pain.  Neurological: Negative for syncope, light-headedness and headaches.  Psychiatric/Behavioral: Negative for dysphoric mood.   Objective:  BP 130/82   Pulse 81   Temp (!) 97.3 F (36.3 C) (Temporal)   Ht 5' 4.5" (1.638 m)   Wt 222 lb (100.7 kg)   SpO2 96%   BMI 37.52 kg/m   Wt Readings from Last 3 Encounters:  06/03/20 222 lb (100.7 kg)  05/06/20 222 lb 4 oz (100.8 kg)  03/24/20 224 lb (101.6 kg)      Physical Exam Constitutional:      General: She is not in acute distress.    Appearance: Normal appearance. She is well-developed. She is obese. She is not ill-appearing or toxic-appearing.  HENT:     Head: Normocephalic.     Right Ear: Hearing, tympanic membrane, ear canal and external ear normal. Tympanic membrane is not erythematous, retracted or bulging.     Left Ear: Hearing, tympanic membrane, ear canal and external ear normal. Tympanic membrane is not erythematous, retracted or bulging.     Nose: No mucosal edema or rhinorrhea.     Right Sinus: No maxillary sinus tenderness or frontal sinus tenderness.     Left Sinus: No maxillary sinus tenderness or frontal sinus tenderness.     Mouth/Throat:     Pharynx: Uvula midline.  Eyes:     General: Lids are normal. Lids are everted, no foreign bodies appreciated.     Conjunctiva/sclera: Conjunctivae  normal.     Pupils: Pupils are equal, round, and reactive to light.  Neck:     Thyroid: No thyroid mass or thyromegaly.     Vascular: No carotid bruit.     Trachea: Trachea normal.  Cardiovascular:     Rate and Rhythm: Normal rate and regular rhythm.     Pulses: Normal pulses.     Heart sounds: Normal heart sounds, S1 normal and S2 normal. No murmur heard. No friction rub. No gallop.   Pulmonary:     Effort: Pulmonary effort is normal. No tachypnea or respiratory distress.     Breath  sounds: Normal breath sounds. No decreased breath sounds, wheezing, rhonchi or rales.  Abdominal:     General: Bowel sounds are normal.     Palpations: Abdomen is soft.     Tenderness: There is no abdominal tenderness.  Musculoskeletal:     Cervical back: Normal range of motion and neck supple.  Skin:    General: Skin is warm and dry.     Findings: No rash.  Neurological:     Mental Status: She is alert.  Psychiatric:        Mood and Affect: Mood is not anxious or depressed.        Speech: Speech normal.        Behavior: Behavior normal. Behavior is cooperative.        Thought Content: Thought content normal.        Judgment: Judgment normal.       Results for orders placed or performed during the hospital encounter of 03/24/20  Comprehensive metabolic panel  Result Value Ref Range   Sodium 135 135 - 145 mmol/L   Potassium 3.9 3.5 - 5.1 mmol/L   Chloride 101 98 - 111 mmol/L   CO2 23 22 - 32 mmol/L   Glucose, Bld 159 (H) 70 - 99 mg/dL   BUN 26 (H) 6 - 20 mg/dL   Creatinine, Ser 1.11 (H) 0.44 - 1.00 mg/dL   Calcium 9.9 8.9 - 10.3 mg/dL   Total Protein 7.3 6.5 - 8.1 g/dL   Albumin 4.1 3.5 - 5.0 g/dL   AST 19 15 - 41 U/L   ALT 30 0 - 44 U/L   Alkaline Phosphatase 59 38 - 126 U/L   Total Bilirubin 0.4 0.3 - 1.2 mg/dL   GFR, Estimated 58 (L) >60 mL/min   Anion gap 11 5 - 15  CBC with Differential  Result Value Ref Range   WBC 14.0 (H) 4.0 - 10.5 K/uL   RBC 4.96 3.87 - 5.11 MIL/uL    Hemoglobin 14.3 12.0 - 15.0 g/dL   HCT 44.3 36.0 - 46.0 %   MCV 89.3 80.0 - 100.0 fL   MCH 28.8 26.0 - 34.0 pg   MCHC 32.3 30.0 - 36.0 g/dL   RDW 12.2 11.5 - 15.5 %   Platelets 279 150 - 400 K/uL   nRBC 0.0 0.0 - 0.2 %   Neutrophils Relative % 83 %   Neutro Abs 11.6 (H) 1.7 - 7.7 K/uL   Lymphocytes Relative 12 %   Lymphs Abs 1.7 0.7 - 4.0 K/uL   Monocytes Relative 4 %   Monocytes Absolute 0.6 0.1 - 1.0 K/uL   Eosinophils Relative 0 %   Eosinophils Absolute 0.0 0.0 - 0.5 K/uL   Basophils Relative 0 %   Basophils Absolute 0.0 0.0 - 0.1 K/uL   Immature Granulocytes 1 %   Abs Immature Granulocytes 0.12 (H) 0.00 - 0.07 K/uL    This visit occurred during the SARS-CoV-2 public health emergency.  Safety protocols were in place, including screening questions prior to the visit, additional usage of staff PPE, and extensive cleaning of exam room while observing appropriate contact time as indicated for disinfecting solutions.   COVID 19 screen:  No recent travel or known exposure to COVID19 The patient denies respiratory symptoms of COVID 19 at this time. The importance of social distancing was discussed today.   Assessment and Plan    Problem List Items Addressed This Visit    Attention deficit disorder (ADD) without hyperactivity - Primary  Slightly improvement in low dose methylphenidate, no SE.  She wpuld like to remain on this dose for the next 3 months to evaluate over time.  likely will need increased dose      Generalized anxiety disorder    Improved control.  Effexor 75 mg daily.      Moderate episode of recurrent major depressive disorder (HCC)   Prediabetes    Eval with POC A1C today given concerning symptoms that could be due to  Glucose elevations.         Meds ordered this encounter  Medications  . diclofenac (VOLTAREN) 75 MG EC tablet    Sig: Take 1 tablet (75 mg total) by mouth 2 (two) times daily.    Dispense:  30 tablet    Refill:  0  . DISCONTD:  methylphenidate (METADATE CD) 10 MG CR capsule    Sig: Take 1 capsule (10 mg total) by mouth every morning. Fill after 06/08/20    Dispense:  30 capsule    Refill:  0  . DISCONTD: methylphenidate (METADATE CD) 10 MG CR capsule    Sig: Take 1 capsule (10 mg total) by mouth every morning. Fill after 07/08/20    Dispense:  30 capsule    Refill:  0  . methylphenidate (METADATE CD) 10 MG CR capsule    Sig: Take 1 capsule (10 mg total) by mouth every morning. Fill after 08/08/20    Dispense:  30 capsule    Refill:  0    Lab Orders     HgB A1c   Eliezer Lofts, MD

## 2020-06-03 NOTE — Assessment & Plan Note (Signed)
Significant improvement on effexor 75 mg daily.. Continue.

## 2020-06-03 NOTE — Assessment & Plan Note (Signed)
Improved control.  Effexor 75 mg daily.

## 2020-06-04 ENCOUNTER — Other Ambulatory Visit: Payer: Self-pay | Admitting: Family Medicine

## 2020-06-08 ENCOUNTER — Other Ambulatory Visit: Payer: Self-pay | Admitting: Family Medicine

## 2020-06-20 ENCOUNTER — Encounter: Payer: Self-pay | Admitting: Surgical

## 2020-06-20 ENCOUNTER — Other Ambulatory Visit: Payer: Self-pay

## 2020-06-20 ENCOUNTER — Ambulatory Visit (INDEPENDENT_AMBULATORY_CARE_PROVIDER_SITE_OTHER): Payer: 59 | Admitting: Surgical

## 2020-06-20 VITALS — Ht 65.0 in | Wt 222.4 lb

## 2020-06-20 DIAGNOSIS — M1711 Unilateral primary osteoarthritis, right knee: Secondary | ICD-10-CM | POA: Diagnosis not present

## 2020-06-20 NOTE — Progress Notes (Signed)
Office Visit Note   Patient: Jaime Chapman           Date of Birth: Jul 06, 1960           MRN: [id number] Visit Date: 06/20/2020 Requested by: Jinny Sanders, MD [place],  [place] [address] PCP: Jinny Sanders, MD  Subjective: Chief Complaint  Patient presents with  . Right Knee - Pain, Follow-up    HPI: Jaime Chapman is a 60 y.o. female who presents to the office complaining of right knee pain.  Patient reports her right knee pain has returned following her gel injection on 05/09/2020.  She notes about 20% relief from the gel injection that only lasted for about 4 weeks.  This is in contrast to the 70% relief that she had from her last cortisone injection on 03/14/2020.  Last cortisone injection lasted about 7 weeks.  She also takes oral diclofenac for her knee pain.  She notes continued pain that wakes her up at times.  She has worse swelling at the end of the day and overall her knee pain is causing significant distress and lack of function in her daily life.  She is requesting injection today.  Patient was also seen recently by bariatric surgeon and has started the process for undergoing bariatric surgery.  Her estimate of when she would actually have the surgery is sometime in fall 2022..                ROS: All systems reviewed are negative as they relate to the chief complaint within the history of present illness.  Patient denies fevers or chills.  Assessment & Plan: Visit Diagnoses:  1. Unilateral primary osteoarthritis, right knee     Plan: Patient is a 59 year old female who returns for reevaluation of right knee pain.  She has history of tricompartmental right knee arthritis.  Cortisone injections currently provided about 7 weeks of relief.  Recent gel injection did not provide lasting relief.  She requests another injection today.  However her last cortisone injection was January 2022 and prior to that was in November.  Recommend waiting 4 months out  from her last cortisone injection so that would be about mid May.  Patient understands and plan to return in mid May for repeat cortisone injection.  She is currently trying to postpone right knee replacement until after her recovery from bariatric surgery as she wants to optimize her weight prior to surgery to decrease risk of complications.  This is reasonable given the increased risks of knee replacement with higher BMI.  However, she is fairly far out from bariatric surgery in fall 2022 and would have to withstand her knee pain throughout her recovery.  It is conceivable that a lot of her knee pain could get better with weight loss but she will likely end up needing total knee replacement at some point in the future regardless.  She understands that pursuing knee replacement is an option prior to bariatric surgery but she will make this decision on her own.  Plan for patient to consider options and return in mid May for cortisone injection.  Follow-Up Instructions: No follow-ups on file.   Orders:  No orders of the defined types were placed in this encounter.  No orders of the defined types were placed in this encounter.     Procedures: No procedures performed   Clinical Data: No additional findings.  Objective: Vital Signs: Ht 5\' 5"  (1.651 m)  Wt 222 lb 6.4 oz (100.9 kg)   BMI 37.01 kg/m   Physical Exam:  Constitutional: Patient appears well-developed HEENT:  Head: Normocephalic Eyes:EOM are normal Neck: Normal range of motion Cardiovascular: Normal rate Pulmonary/chest: Effort normal Neurologic: Patient is alert Skin: Skin is warm Psychiatric: Patient has normal mood and affect  Ortho Exam: Ortho exam demonstrates right knee with 0 degrees extension and 120 degrees of flexion.  No significant alignment deformity.  Tenderness over the medial and lateral joint lines, more so over the medial.  Pain with passive flexion of the knee.  No tenderness over the calf.  Negative  Homans' sign.  No pain with hip range of motion.  Able to perform straight leg raise.  Specialty Comments:  No specialty comments available.  Imaging: No results found.   PMFS History: Patient Active Problem List   Diagnosis Date Noted  . Moderate episode of recurrent major depressive disorder ([place]) 06/03/2020  . 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 Acute pain of right knee 11/30/2019  . Class 2 obesity due to excess calories without serious comorbidity with body mass index (BMI) of 37.0 to 37.9 in adult 09/14/2019  . Excessive sweating 01/09/2019  . OSA (obstructive sleep apnea) 05/10/2017  . Fatigue 04/05/2017  . Snoring 04/05/2017  . Pre-syncope 03/10/2017  . Abnormal CT of the chest 08/31/2016  . Panic attacks 04/09/2016  . Major depression, recurrent ([place]) 05/08/2015  . Essential hypertension 07/26/2014  . Chest pressure 07/26/2014  . Constipation - functional 11/20/2013  . Hypothyroidism 11/16/2013  . H/O ventral hernia repair 05/01/2012  . Pulmonary nodules 01/26/2012  . Prediabetes 03/04/2011  . Fatty liver disease, nonalcoholic [id number]  . Generalized anxiety disorder 10/21/2010  . VENTRAL HERNIA-twice recurrent 09/08/2009  . Vitamin D deficiency 12/27/2008  . Hyperlipidemia 05/22/2007  . Moderate persistent asthma 05/22/2007  . DEGENERATIVE DISC DISEASE 05/22/2007  . Fibromyalgia 05/22/2007  . Allergic rhinitis 04/14/2007  . GERD 04/14/2007   Past Medical History:  Diagnosis Date  . Allergic rhinitis   . Anxiety   . Asthma   . DDD (degenerative disc disease)    also has spinal arthritis  . Depression   . Diabetes mellitus    borderline  . Dyslipidemia   . Fibromyalgia   . GERD (gastroesophageal reflux disease)   . Lung nodules    RIGHT LUNG--LAST EVALUATED BY CHEST CT 02/01/12 - STABLE AND FOLLOW UP PLANNED IN ONE YEAR.  [name] Lyme disease   . Obesity   . OSA (obstructive sleep apnea) 05/10/2017  . Sinusitis   . Stress  disorder, acute   . Vitamin D deficiency     Family History  Problem Relation Age of Onset  . Diabetes Mother   . Heart disease Mother 53  . Arthritis Mother   . Lung cancer Maternal Grandmother   . Cancer Maternal Grandmother        lung  . Colon cancer Neg Hx     Past Surgical History:  Procedure Laterality Date  . [place]   x 2   . ENDOMETRIAL ABLATION  2009  . HERNIA REPAIR  04/14/12   ventral hernia repair  . Laparoscopic ventral hernia repair w/ mesh  08/2009   Dr. Grandville Silos, [place]  . LAPAROSCOPY  2002  . TUBAL LIGATION    . VENTRAL HERNIA REPAIR  09/2006   Dr. Grandville Silos  . VENTRAL HERNIA REPAIR N/A 04/14/2012   Procedure: Laparoscopic Repair of  Ventral Hernia;  Surgeon: Pedro Earls, MD;  Location: [facility];  Service: General;  Laterality: N/A;  Repair of Recurrent Ventral Hernia   Social History   Occupational History  . Occupation: Corporate treasurer  Tobacco Use  . Smoking status: Former Smoker    Quit date: 03/01/2004    Years since quitting: 16.3  . Smokeless tobacco: Never Used  Vaping Use  . Vaping Use: Never used  Substance and Sexual Activity  . Alcohol use: No    Alcohol/week: 1.0 standard drink    Types: 1 Standard drinks or equivalent per week  . Drug use: No    Comment: remote [place]  . Sexual activity: Yes    Partners: Male

## 2020-06-30 ENCOUNTER — Telehealth: Payer: Self-pay | Admitting: *Deleted

## 2020-06-30 ENCOUNTER — Ambulatory Visit: Payer: 59 | Admitting: Adult Health

## 2020-06-30 NOTE — Telephone Encounter (Addendum)
Patient called stating that she all of a sudden developed a severe headache at 11:30 am and her blood pressure was 186/112.  Patient stated that she had not taken her blood pressure medication yet at that time. Patient stated that she immediately took two Ibuprofen 200 mg and her blood pressure medication.. Patient stated that the headache was at the back of her head. Patient stated that when the headache first came on it was a pain level 9. Patient stated that the headache has eased up to about a level 4 now. Patient stated that now the headache seems to be all over her head. Patient stated that her husband checked her blood pressue around 11:45 and it was 150/90 and heart rate 95. Patient denies any blurred vision, nausea or slurred speech. Patient stated that she is extremely weak. After speaking to Dr. Diona Browner patient was advised to relax for about an hour and see if the headache eases up and her blood pressure comes down. Patient was advised that she  can take two more ibuprofen if she needs to. Patient was advised if her blood pressure stays up and she continue with the headache she should go to an UC to be evaluated. Patient stated that she will do as recommended and will update our office in about an hour. Patient was advised if she feels that she needs to go to the Urgent Care she can certainly can go now and not wait. Patient was given ER precautions and she verbalized understanding.

## 2020-06-30 NOTE — Telephone Encounter (Signed)
Noted  

## 2020-07-02 NOTE — Telephone Encounter (Signed)
I don't see any record of patient going to an UC.[name] Patient did not call the office back with an update.

## 2020-07-02 NOTE — Telephone Encounter (Signed)
Noted  

## 2020-07-02 NOTE — Telephone Encounter (Signed)
Please call pt with update on BP control.

## 2020-07-02 NOTE — Telephone Encounter (Signed)
Spoke with [employer].  She states she didn't call us back because she fell asleep and slept all evening.  She still had a headache yesterday and her BP readings were 142/96 and then 113/82 last night before she went to bed. Today her headache is gone and she feels much better.  She will continue to monitor her BPs and call us if she finds she is consistently running >140/90.

## 2020-07-03 ENCOUNTER — Encounter: Payer: Self-pay | Admitting: Adult Health

## 2020-07-03 ENCOUNTER — Other Ambulatory Visit: Payer: Self-pay

## 2020-07-03 ENCOUNTER — Ambulatory Visit (INDEPENDENT_AMBULATORY_CARE_PROVIDER_SITE_OTHER): Payer: 59 | Admitting: Adult Health

## 2020-07-03 VITALS — BP 120/90 | HR 103 | Temp 97.8°F | Ht 64.0 in | Wt 223.0 lb

## 2020-07-03 DIAGNOSIS — J309 Allergic rhinitis, unspecified: Secondary | ICD-10-CM

## 2020-07-03 DIAGNOSIS — J4540 Moderate persistent asthma, uncomplicated: Secondary | ICD-10-CM | POA: Diagnosis not present

## 2020-07-03 DIAGNOSIS — G4733 Obstructive sleep apnea (adult) (pediatric): Secondary | ICD-10-CM | POA: Diagnosis not present
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
## 2020-07-03 NOTE — Assessment & Plan Note (Signed)
Mild persistent asthma currently under good control.  Continue trigger prevention.  We will hold off on maintenance inhaler nail can used Singulair daily.  And use albuterol as needed  Plan  Patient Instructions  Continue on Singulair 10mg  At bedtime   Albuterol inhaler As needed   Call if asthma flares, can restart Advair .  Home sleep study .  Healthy sleep regimen .  Follow up with Dr. Ander Slade or Jawan Chavarria NP  3-4 months and As needed   Please contact office for sooner follow up if symptoms do not improve or worsen or seek emergency care

## 2020-07-03 NOTE — Assessment & Plan Note (Signed)
Continue on Singulair daily °

## 2020-07-03 NOTE — Assessment & Plan Note (Signed)
Mild OSA - previously intolerant to CPAP  Has high symptom burden Undergoing weight loss bariatric surgery evaluation.  Part of the requirement is management of sleep apnea. Patient continues to have ongoing symptoms and has significant daytime sleepiness restless sleep.   She has underwent counseling for her posttraumatic stress disorder and believes that she can now wear a facemask.  We will set her up for a home sleep study.  Plan  . Patient Instructions  Continue on Singulair 10mg  At bedtime   Albuterol inhaler As needed   Call if asthma flares, can restart Advair .  Home sleep study .  Healthy sleep regimen .  Follow up with Dr. Ander Slade or Imanuel Pruiett NP  3-4 months and As needed   Please contact office for sooner follow up if symptoms do not improve or worsen or seek emergency care

## 2020-07-03 NOTE — Patient Instructions (Addendum)
Continue on Singulair 10mg  At bedtime   Albuterol inhaler As needed   Call if asthma flares, can restart Advair .  Home sleep study .  Healthy sleep regimen .  Follow up with Dr. Ander Slade or Eiman Maret NP  3-4 months and As needed   Please contact office for sooner follow up if symptoms do not improve or worsen or seek emergency care

## 2020-07-07 ENCOUNTER — Telehealth: Payer: Self-pay | Admitting: Adult Health

## 2020-07-07 NOTE — Telephone Encounter (Signed)
Fax number: [phone]

## 2020-07-08 NOTE — Telephone Encounter (Signed)
Faxed records to [facility] for prior [place]

## 2020-07-09 ENCOUNTER — Telehealth: Payer: Self-pay | Admitting: Orthopedic Surgery

## 2020-07-09 NOTE — Telephone Encounter (Signed)
Patient called requesting a caLL BACK TO SEE IF IT'S TIME FOR AN INJECTION. pLEASE CALL PATIENT AT  [phone].

## 2020-07-09 NOTE — Telephone Encounter (Signed)
Per last note pt can come in for injection in the middle of may. I called pt and scheduled this.

## 2020-07-17 ENCOUNTER — Other Ambulatory Visit: Payer: Self-pay

## 2020-07-17 ENCOUNTER — Encounter: Payer: Self-pay | Admitting: Orthopedic Surgery

## 2020-07-17 ENCOUNTER — Ambulatory Visit (INDEPENDENT_AMBULATORY_CARE_PROVIDER_SITE_OTHER): Payer: 59 | Admitting: Orthopedic Surgery

## 2020-07-17 DIAGNOSIS — M1711 Unilateral primary osteoarthritis, right knee: Secondary | ICD-10-CM | POA: Diagnosis not present

## 2020-07-17 MED ORDER — BUPIVACAINE HCL 0.25 % IJ SOLN
4.0000 mL | INTRAMUSCULAR | Status: AC | PRN
  Administered 2020-07-17: 4 mL via INTRA_ARTICULAR

## 2020-07-17 MED ORDER — BETAMETHASONE SOD PHOS & ACET 6 (3-3) MG/ML IJ SUSP
6.0000 mg | INTRAMUSCULAR | Status: AC | PRN
  Administered 2020-07-17: 6 mg via INTRA_ARTICULAR

## 2020-07-17 MED ORDER — LIDOCAINE HCL 1 % IJ SOLN
5.0000 mL | INTRAMUSCULAR | Status: AC | PRN
  Administered 2020-07-17: 5 mL

## 2020-07-17 NOTE — Progress Notes (Signed)
   Procedure Note  Patient: Jaime Chapman             Date of Birth: [birth year]           MRN: [id number]             Visit Date: 07/17/2020  Procedures: Visit Diagnoses:  1. Unilateral primary osteoarthritis, right knee     Large Joint Inj: R knee on 07/17/2020 9:32 AM Indications: diagnostic evaluation, joint swelling and pain Details: 18 G 1.5 in needle, superolateral approach  Arthrogram: No  Medications: 5 mL lidocaine 1 %; 4 mL bupivacaine 0.25 %; 6 mg betamethasone acetate-betamethasone sodium phosphate 6 (3-3) MG/ML Outcome: tolerated well, no immediate complications Procedure, treatment alternatives, risks and benefits explained, specific risks discussed. Consent was given by the patient. Immediately prior to procedure a time out was called to verify the correct patient, procedure, equipment, support staff and site/side marked as required. Patient was prepped and draped in the usual sterile fashion.    Patient presents today for planned injection into the right knee.  Bariatric surgery pending for later this year.  She has lost 8 pounds with nutritional consultation.  Follow-up as needed.

## 2020-08-05 ENCOUNTER — Other Ambulatory Visit: Payer: Self-pay

## 2020-08-05 ENCOUNTER — Ambulatory Visit: Payer: 59

## 2020-08-05 DIAGNOSIS — G4733 Obstructive sleep apnea (adult) (pediatric): Secondary | ICD-10-CM

## 2020-08-05 LAB — HM MAMMOGRAPHY

## 2020-08-05 LAB — HM PAP SMEAR: HM Pap smear: NEGATIVE

## 2020-08-07 DIAGNOSIS — G4733 Obstructive sleep apnea (adult) (pediatric): Secondary | ICD-10-CM

## 2020-09-04 ENCOUNTER — Telehealth: Payer: Self-pay

## 2020-09-04 NOTE — Telephone Encounter (Signed)
Patient called in stating that she is having lab work done Tuesday, and wanted to know if Dr.Bedsole can add on CMP, CBC, TSH, A1C, VIT. D, LIPID PANEL, B12/FOLIC ACID, FERRATIN to her labs.

## 2020-09-08 ENCOUNTER — Telehealth: Payer: Self-pay | Admitting: Family Medicine

## 2020-09-08 DIAGNOSIS — Z130 Encounter for screening for diseases of the blood and blood-forming organs and certain disorders involving the immune mechanism: Secondary | ICD-10-CM

## 2020-09-08 DIAGNOSIS — Z1321 Encounter for screening for nutritional disorder: Secondary | ICD-10-CM

## 2020-09-08 DIAGNOSIS — E039 Hypothyroidism, unspecified: Secondary | ICD-10-CM

## 2020-09-08 DIAGNOSIS — E559 Vitamin D deficiency, unspecified: Secondary | ICD-10-CM

## 2020-09-08 DIAGNOSIS — E782 Mixed hyperlipidemia: Secondary | ICD-10-CM

## 2020-09-08 DIAGNOSIS — R7303 Prediabetes: Secondary | ICD-10-CM

## 2020-09-08 NOTE — Telephone Encounter (Signed)
-----   Message from Ellamae Sia sent at 08/25/2020 10:49 AM EDT ----- Regarding: Lab orders for Tuesday, 7.12.22 Patient is scheduled for CPX labs, please order future labs, Thanks , Karna Christmas

## 2020-09-08 NOTE — Telephone Encounter (Signed)
done

## 2020-09-09 ENCOUNTER — Other Ambulatory Visit: Payer: Self-pay

## 2020-09-09 ENCOUNTER — Other Ambulatory Visit (INDEPENDENT_AMBULATORY_CARE_PROVIDER_SITE_OTHER): Payer: 59

## 2020-09-09 DIAGNOSIS — E559 Vitamin D deficiency, unspecified: Secondary | ICD-10-CM

## 2020-09-09 DIAGNOSIS — E039 Hypothyroidism, unspecified: Secondary | ICD-10-CM | POA: Diagnosis not present

## 2020-09-09 DIAGNOSIS — E782 Mixed hyperlipidemia: Secondary | ICD-10-CM | POA: Diagnosis not present

## 2020-09-09 DIAGNOSIS — Z130 Encounter for screening for diseases of the blood and blood-forming organs and certain disorders involving the immune mechanism: Secondary | ICD-10-CM | POA: Diagnosis not present

## 2020-09-09 DIAGNOSIS — R7303 Prediabetes: Secondary | ICD-10-CM

## 2020-09-09 DIAGNOSIS — Z1321 Encounter for screening for nutritional disorder: Secondary | ICD-10-CM

## 2020-09-09 LAB — CBC WITH DIFFERENTIAL/PLATELET
Basophils Absolute: 0 10*3/uL (ref 0.0–0.1)
Basophils Relative: 0.7 % (ref 0.0–3.0)
Eosinophils Absolute: 0.2 10*3/uL (ref 0.0–0.7)
Eosinophils Relative: 3.2 % (ref 0.0–5.0)
HCT: 38.8 % (ref 36.0–46.0)
Hemoglobin: 13.3 g/dL (ref 12.0–15.0)
Lymphocytes Relative: 30.4 % (ref 12.0–46.0)
Lymphs Abs: 1.7 10*3/uL (ref 0.7–4.0)
MCHC: 34.2 g/dL (ref 30.0–36.0)
MCV: 85.8 fl (ref 78.0–100.0)
Monocytes Absolute: 0.5 10*3/uL (ref 0.1–1.0)
Monocytes Relative: 8.1 % (ref 3.0–12.0)
Neutro Abs: 3.3 10*3/uL (ref 1.4–7.7)
Neutrophils Relative %: 57.6 % (ref 43.0–77.0)
Platelets: 245 10*3/uL (ref 150.0–400.0)
RBC: 4.52 Mil/uL (ref 3.87–5.11)
RDW: 13.2 % (ref 11.5–15.5)
WBC: 5.6 10*3/uL (ref 4.0–10.5)

## 2020-09-09 LAB — FOLATE: Folate: 9.1 ng/mL (ref >5.9)

## 2020-09-09 LAB — HEMOGLOBIN A1C: Hgb A1c MFr Bld: 6.2 % (ref 4.6–6.5)

## 2020-09-09 LAB — TSH: TSH: 0.58 u[IU]/mL (ref 0.35–5.50)

## 2020-09-09 LAB — COMPREHENSIVE METABOLIC PANEL
ALT: 17 U/L (ref 0–35)
AST: 15 U/L (ref 0–37)
Albumin: 4.2 g/dL (ref 3.5–5.2)
Alkaline Phosphatase: 50 U/L (ref 39–117)
BUN: 21 mg/dL (ref 6–23)
CO2: 28 mEq/L (ref 19–32)
Calcium: 9.4 mg/dL (ref 8.4–10.5)
Chloride: 103 mEq/L (ref 96–112)
Creatinine, Ser: 0.97 mg/dL (ref 0.40–1.20)
GFR: 64.06 mL/min (ref >60.00)
Glucose, Bld: 131 mg/dL — ABNORMAL HIGH (ref 70–99)
Potassium: 4 mEq/L (ref 3.5–5.1)
Sodium: 139 mEq/L (ref 135–145)
Total Bilirubin: 0.4 mg/dL (ref 0.2–1.2)
Total Protein: 6.8 g/dL (ref 6.0–8.3)

## 2020-09-09 LAB — LIPID PANEL
Cholesterol: 209 mg/dL — ABNORMAL HIGH (ref 0–200)
HDL: 38.8 mg/dL — ABNORMAL LOW (ref >39.00)
NonHDL: 170.52
Total CHOL/HDL Ratio: 5
Triglycerides: 219 mg/dL — ABNORMAL HIGH (ref 0.0–149.0)
VLDL: 43.8 mg/dL — ABNORMAL HIGH (ref 0.0–40.0)

## 2020-09-09 LAB — IBC + FERRITIN
Ferritin: 205.6 ng/mL (ref 10.0–291.0)
Iron: 57 ug/dL (ref 42–145)
Saturation Ratios: 20.8 % (ref 20.0–50.0)
Transferrin: 196 mg/dL — ABNORMAL LOW (ref 212.0–360.0)

## 2020-09-09 LAB — VITAMIN B12: Vitamin B-12: 279 pg/mL (ref 211–911)

## 2020-09-09 LAB — T3, FREE: T3, Free: 3.3 pg/mL (ref 2.3–4.2)

## 2020-09-09 LAB — VITAMIN D 25 HYDROXY (VIT D DEFICIENCY, FRACTURES): VITD: 32.27 ng/mL (ref 30.00–100.00)

## 2020-09-09 LAB — T4, FREE: Free T4: 0.59 ng/dL — ABNORMAL LOW (ref 0.60–1.60)

## 2020-09-09 LAB — LDL CHOLESTEROL, DIRECT: Direct LDL: 139 mg/dL

## 2020-09-09 NOTE — Progress Notes (Signed)
No critical labs need to be addressed urgently. We will discuss labs in detail at upcoming office visit.   

## 2020-09-16 ENCOUNTER — Ambulatory Visit (INDEPENDENT_AMBULATORY_CARE_PROVIDER_SITE_OTHER): Payer: 59 | Admitting: Family Medicine

## 2020-09-16 ENCOUNTER — Other Ambulatory Visit: Payer: Self-pay

## 2020-09-16 ENCOUNTER — Encounter: Payer: Self-pay | Admitting: Family Medicine

## 2020-09-16 VITALS — BP 110/65 | HR 73 | Temp 97.5°F | Ht 64.5 in | Wt 213.0 lb

## 2020-09-16 DIAGNOSIS — Z0000 Encounter for general adult medical examination without abnormal findings: Secondary | ICD-10-CM

## 2020-09-16 DIAGNOSIS — E039 Hypothyroidism, unspecified: Secondary | ICD-10-CM

## 2020-09-16 DIAGNOSIS — E782 Mixed hyperlipidemia: Secondary | ICD-10-CM

## 2020-09-16 DIAGNOSIS — Z6837 Body mass index (BMI) 37.0-37.9, adult: Secondary | ICD-10-CM

## 2020-09-16 DIAGNOSIS — F411 Generalized anxiety disorder: Secondary | ICD-10-CM

## 2020-09-16 DIAGNOSIS — F331 Major depressive disorder, recurrent, moderate: Secondary | ICD-10-CM

## 2020-09-16 DIAGNOSIS — F988 Other specified behavioral and emotional disorders with onset usually occurring in childhood and adolescence: Secondary | ICD-10-CM

## 2020-09-16 DIAGNOSIS — R7303 Prediabetes: Secondary | ICD-10-CM | POA: Diagnosis not present

## 2020-09-16 DIAGNOSIS — I10 Essential (primary) hypertension: Secondary | ICD-10-CM

## 2020-09-16 DIAGNOSIS — E6609 Other obesity due to excess calories: Secondary | ICD-10-CM

## 2020-09-16 NOTE — Assessment & Plan Note (Signed)
Stable, chronic.  Continue current medication.    

## 2020-09-16 NOTE — Progress Notes (Signed)
Patient ID: Jaime Chapman, female    DOB: 1960-06-21, 60 y.o.   MRN: [id number]  This visit was conducted in person.  BP 110/65   Pulse 73   Temp (!) 97.5 F (36.4 C) (Temporal)   Ht 5' 4.5" (1.638 m)   Wt 213 lb (96.6 kg)   SpO2 97%   BMI 36.00 kg/m    CC: Chief Complaint  Patient presents with   Annual Exam    Subjective:   HPI: Jaime Chapman is a 60 y.o. female presenting on 09/16/2020 for Annual Exam  Reviewed 09/04/20 OV from bariatric clinic Dr. Evorn Gong  Has form for me to complete to get PCP approval for insurance.  At last OV here she was started on ADD medication.. methylphenidate 10 mg daily... she does not feel like it helps much at this dose.  NO SE such as tremor , palpitations. She has had some appetite decrease.   MDDGAD:  good control on venlafaxine .. still issues with family stress. [place] Office Visit from 06/03/2020 in [place] at [facility] Total Score 0       Reviewed labs in detail  Diet:  seeing nutritionist. Exercise:   Wt Readings from Last 3 Encounters:  09/16/20 213 lb (96.6 kg)  07/03/20 223 lb (101.2 kg)  06/20/20 222 lb 6.4 oz (100.9 kg)   Hypothyroid  Stable on levo 25 mcg daily Lab Results  Component Value Date   TSH 0.58 09/09/2020   Prediabetes  Lab Results  Component Value Date   HGBA1C 6.2 09/09/2020   Vit d in nml range  Elevated Cholesterol:  Lab Results  Component Value Date   CHOL 209 (H) 09/09/2020   HDL 38.80 (L) 09/09/2020   LDLCALC 132 (H) 04/20/2018   LDLDIRECT 139.0 09/09/2020   TRIG 219.0 (H) 09/09/2020   CHOLHDL 5 09/09/2020  Using medications without problems: Muscle aches:  Diet compliance: Exercise: Other complaints:      Relevant past medical, surgical, family and social history reviewed and updated as indicated. Interim medical history since our last visit reviewed. Allergies and medications reviewed and updated. Outpatient Medications Prior to Visit   Medication Sig Dispense Refill   albuterol (VENTOLIN HFA) 108 (90 Base) MCG/ACT inhaler INHALE 2 PUFFS INTO THE LUNGS EVERY 4 HOURS AS NEEDED FOR WHEEIZNG 18 g 3   ARMOUR THYROID 90 MG tablet Take 90 mg by mouth daily.     clobetasol ointment (TEMOVATE) 0.05 % Apply topically daily as needed.     estradiol (VIVELLE-DOT) 0.1 MG/24HR patch Place 1 patch onto the skin 2 (two) times a week.     hydrochlorothiazide (HYDRODIURIL) 25 MG tablet TAKE 1 TABLET BY MOUTH EVERY DAY (TAKE WITH LOSARTAN) 90 tablet 1   hydrOXYzine (ATARAX/VISTARIL) 10 MG tablet hydroxyzine HCl 10 mg tablet  TAKE 1 TABLET (10 MG TOTAL) BY MOUTH 3 (THREE) TIMES DAILY AS NEEDED FOR ANXIETY (PANIC ATTACK).     levothyroxine (SYNTHROID) 25 MCG tablet Take 25 mcg by mouth daily.     losartan (COZAAR) 100 MG tablet TAKE 1 TABLET BY MOUTH EVERY DAY (TAKE WITH HCTZ) 90 tablet 1   methylphenidate (METADATE CD) 10 MG CR capsule Take 1 capsule (10 mg total) by mouth every morning. Fill after 08/08/20 30 capsule 0   montelukast (SINGULAIR) 10 MG tablet Take 1 tablet (10 mg total) by mouth at bedtime. 90 tablet 3   progesterone (PROMETRIUM) 200 MG capsule Take 200 mg by mouth  daily.     venlafaxine XR (EFFEXOR-XR) 75 MG 24 hr capsule TAKE 1 CAPSULE BY MOUTH DAILY WITH BREAKFAST. 90 capsule 1   diclofenac (VOLTAREN) 75 MG EC tablet Take 1 tablet (75 mg total) by mouth 2 (two) times daily. 30 tablet 0   Insulin Pen Needle (BD PEN NEEDLE MICRO U/F) 32G X 6 MM MISC Use to inject Saxenda once weekly 50 each 0   nystatin (MYCOSTATIN) 500000 units TABS tablet Take 1 tablet by mouth 2 (two) times daily.     progesterone (PROMETRIUM) 100 MG capsule Take 1 tablet by mouth daily x 3 weeks then on stop on the fourth week.  repeat     No facility-administered medications prior to visit.     Per HPI unless specifically indicated in ROS section below Review of Systems  Constitutional:  Negative for fatigue and fever.  HENT:  Negative for congestion.    Eyes:  Negative for pain.  Respiratory:  Negative for cough and shortness of breath.   Cardiovascular:  Negative for chest pain, palpitations and leg swelling.  Gastrointestinal:  Negative for abdominal pain.  Genitourinary:  Negative for dysuria and vaginal bleeding.  Musculoskeletal:  Negative for back pain.  Neurological:  Negative for syncope, light-headedness and headaches.  Psychiatric/Behavioral:  Negative for dysphoric mood.   Objective:  BP 110/65   Pulse 73   Temp (!) 97.5 F (36.4 C) (Temporal)   Ht 5' 4.5" (1.638 m)   Wt 213 lb (96.6 kg)   SpO2 97%   BMI 36.00 kg/m   Wt Readings from Last 3 Encounters:  09/16/20 213 lb (96.6 kg)  07/03/20 223 lb (101.2 kg)  06/20/20 222 lb 6.4 oz (100.9 kg)      Physical Exam Constitutional:      General: She is not in acute distress.    Appearance: Normal appearance. She is well-developed. She is obese. She is not ill-appearing or toxic-appearing.  HENT:     Head: Normocephalic.     Right Ear: Hearing, tympanic membrane, ear canal and external ear normal.     Left Ear: Hearing, tympanic membrane, ear canal and external ear normal.     Nose: Nose normal.  Eyes:     General: Lids are normal. Lids are everted, no foreign bodies appreciated.     Conjunctiva/sclera: Conjunctivae normal.     Pupils: Pupils are equal, round, and reactive to light.  Neck:     Thyroid: No thyroid mass or thyromegaly.     Vascular: No carotid bruit.     Trachea: Trachea normal.  Cardiovascular:     Rate and Rhythm: Normal rate and regular rhythm.     Heart sounds: Normal heart sounds, S1 normal and S2 normal. No murmur heard.   No gallop.  Pulmonary:     Effort: Pulmonary effort is normal. No respiratory distress.     Breath sounds: Normal breath sounds. No wheezing, rhonchi or rales.  Abdominal:     General: Bowel sounds are normal. There is no distension or abdominal bruit.     Palpations: Abdomen is soft. There is no fluid wave or mass.      Tenderness: There is no abdominal tenderness. There is no guarding or rebound.     Hernia: No hernia is present.  Musculoskeletal:     Cervical back: Normal range of motion and neck supple.  Lymphadenopathy:     Cervical: No cervical adenopathy.  Skin:    General: Skin is warm and dry.  Findings: No rash.  Neurological:     Mental Status: She is alert.     Cranial Nerves: No cranial nerve deficit.     Sensory: No sensory deficit.  Psychiatric:        Mood and Affect: Mood is not anxious or depressed.        Speech: Speech normal.        Behavior: Behavior normal. Behavior is cooperative.        Judgment: Judgment normal.      Results for orders placed or performed in visit on 09/09/20  Folate  Result Value Ref Range   Folate 9.1 >5.9 ng/mL  Vitamin B12  Result Value Ref Range   Vitamin B-12 279 211 - 911 pg/mL  IBC + Ferritin  Result Value Ref Range   Iron 57 42 - 145 ug/dL   Transferrin 196.0 (L) 212.0 - 360.0 mg/dL   Saturation Ratios 20.8 20.0 - 50.0 %   Ferritin 205.6 10.0 - 291.0 ng/mL  CBC with Differential/Platelet  Result Value Ref Range   WBC 5.6 4.0 - 10.5 K/uL   RBC 4.52 3.87 - 5.11 Mil/uL   Hemoglobin 13.3 12.0 - 15.0 g/dL   HCT 38.8 36.0 - 46.0 %   MCV 85.8 78.0 - 100.0 fl   MCHC 34.2 30.0 - 36.0 g/dL   RDW 13.2 11.5 - 15.5 %   Platelets 245.0 150.0 - 400.0 K/uL   Neutrophils Relative % 57.6 43.0 - 77.0 %   Lymphocytes Relative 30.4 12.0 - 46.0 %   Monocytes Relative 8.1 3.0 - 12.0 %   Eosinophils Relative 3.2 0.0 - 5.0 %   Basophils Relative 0.7 0.0 - 3.0 %   Neutro Abs 3.3 1.4 - 7.7 K/uL   Lymphs Abs 1.7 0.7 - 4.0 K/uL   Monocytes Absolute 0.5 0.1 - 1.0 K/uL   Eosinophils Absolute 0.2 0.0 - 0.7 K/uL   Basophils Absolute 0.0 0.0 - 0.1 K/uL  T3, free  Result Value Ref Range   T3, Free 3.3 2.3 - 4.2 pg/mL  T4, free  Result Value Ref Range   Free T4 0.59 (L) 0.60 - 1.60 ng/dL  TSH  Result Value Ref Range   TSH 0.58 0.35 - 5.50 uIU/mL   VITAMIN D 25 Hydroxy (Vit-D Deficiency, Fractures)  Result Value Ref Range   VITD 32.27 30.00 - 100.00 ng/mL  Comprehensive metabolic panel  Result Value Ref Range   Sodium 139 135 - 145 mEq/L   Potassium 4.0 3.5 - 5.1 mEq/L   Chloride 103 96 - 112 mEq/L   CO2 28 19 - 32 mEq/L   Glucose, Bld 131 (H) 70 - 99 mg/dL   BUN 21 6 - 23 mg/dL   Creatinine, Ser 0.97 0.40 - 1.20 mg/dL   Total Bilirubin 0.4 0.2 - 1.2 mg/dL   Alkaline Phosphatase 50 39 - 117 U/L   AST 15 0 - 37 U/L   ALT 17 0 - 35 U/L   Total Protein 6.8 6.0 - 8.3 g/dL   Albumin 4.2 3.5 - 5.2 g/dL   GFR 64.06 >60.00 mL/min   Calcium 9.4 8.4 - 10.5 mg/dL  Lipid panel  Result Value Ref Range   Cholesterol 209 (H) 0 - 200 mg/dL   Triglycerides 219.0 (H) 0.0 - 149.0 mg/dL   HDL 38.80 (L) >39.00 mg/dL   VLDL 43.8 (H) 0.0 - 40.0 mg/dL   Total CHOL/HDL Ratio 5    NonHDL 170.52   Hemoglobin A1c  Result Value  Ref Range   Hgb A1c MFr Bld 6.2 4.6 - 6.5 %  LDL cholesterol, direct  Result Value Ref Range   Direct LDL 139.0 mg/dL    This visit occurred during the SARS-CoV-2 public health emergency.  Safety protocols were in place, including screening questions prior to the visit, additional usage of staff PPE, and extensive cleaning of exam room while observing appropriate contact time as indicated for disinfecting solutions.   COVID 19 screen:  No recent travel or known exposure to COVID19 The patient denies respiratory symptoms of COVID 19 at this time. The importance of social distancing was discussed today.   Assessment and Plan The patient's preventative maintenance and recommended screening tests for an annual wellness exam were reviewed in full today. Brought up to date unless services declined.  Counselled on the importance of diet, exercise, and its role in overall health and mortality. The patient's FH and SH was reviewed, including their home life, tobacco status, and drug and alcohol status.    Vaccines:   COVID  x 3, uptodate with Td, shingrix Pap/DVE:  2022 [facility]. Mammo: Done in 2022 per GYN. Colon:  10/2010, repeat in 10 years. Smoking Status:none ETOH/ drug DXI:PJAS/NKNL  HIV screen:   refused per pt done in past.  Problem List Items Addressed This Visit     Attention deficit disorder (ADD) without hyperactivity    Inadequate control on methylphenidate 10 mg daily..  She is not interested in increasing dose. Also with upcoming surgery I do not recommend Med change at this time.       Class 2 obesity due to excess calories without serious comorbidity with body mass index (BMI) of 37.0 to 37.9 in adult    Planning bariatric surgery in 11/2020.. pt is excellent candidate.       Essential hypertension    Stable, chronic.  Continue current medication.          Generalized anxiety disorder   Relevant Medications   hydrOXYzine (ATARAX/VISTARIL) 10 MG tablet   Hyperlipidemia    Stable, chronic.  Continue current medication.          Hypothyroidism    Stable, chronic.  Continue current medication.          Moderate episode of recurrent major depressive disorder (HCC)    Stable, chronic.  Continue current medication.    PHQ9 SCORE ONLY 09/16/2020 06/03/2020 05/06/2020  PHQ-9 Total Score 9 5 14         Relevant Medications   hydrOXYzine (ATARAX/VISTARIL) 10 MG tablet   Prediabetes   Other Visit Diagnoses     Routine general medical examination at a health care facility    -  Primary   Morbid obesity ([place])            Eliezer Lofts, MD

## 2020-09-16 NOTE — Assessment & Plan Note (Signed)
Inadequate control on methylphenidate 10 mg daily..  She is not interested in increasing dose. Also with upcoming surgery I do not recommend Med change at this time.

## 2020-09-16 NOTE — Assessment & Plan Note (Signed)
Stable, chronic.  Continue current medication.    PHQ9 SCORE ONLY 09/16/2020 06/03/2020 05/06/2020  PHQ-9 Total Score 9 5 14

## 2020-09-16 NOTE — Patient Instructions (Addendum)
Call to set up colonoscopy 10/2020.. Dr. Fuller Plan  Due to there being a large influx of referrals the GI offices are currently scheduling appointments as far out as late Aug/Sept 2022. They have asked that you call their office regarding your referral and scheduling needs. Otherwise, they will reach out to you as soon as they are able. They are working diligently to get patients called and scheduled as soon as possible.   Colonoscopies will be called according to date/time of the referral entry as these are not of urgent need.     [facility]  [phone]  [facility]  [phone]  [facility]  [phone]   [facility] can be considered as well. They may be able to book sooner appointments. You can reach [facility] at [phone].   Thank you for your patience and please let us know if you have any questions or concerns.    Thank You!   - [facility]errals

## 2020-09-16 NOTE — Assessment & Plan Note (Signed)
Planning bariatric surgery in 11/2020.. pt is excellent candidate.

## 2020-09-17 ENCOUNTER — Encounter: Payer: Self-pay | Admitting: Family Medicine

## 2020-10-01 ENCOUNTER — Encounter: Payer: Self-pay | Admitting: Pulmonary Disease

## 2020-10-01 ENCOUNTER — Ambulatory Visit (INDEPENDENT_AMBULATORY_CARE_PROVIDER_SITE_OTHER): Payer: 59 | Admitting: Pulmonary Disease

## 2020-10-01 ENCOUNTER — Other Ambulatory Visit: Payer: Self-pay

## 2020-10-01 VITALS — BP 124/80 | HR 85 | Temp 98.2°F | Ht 64.0 in | Wt 212.6 lb

## 2020-10-01 DIAGNOSIS — G4733 Obstructive sleep apnea (adult) (pediatric): Secondary | ICD-10-CM | POA: Diagnosis not present

## 2020-10-01 NOTE — Patient Instructions (Signed)
Moderate obstructive sleep apnea -Continue weight loss efforts -Option of treatment would be an oral device, this will involve referral to a dentist  Asthma -Well-controlled at present -Continue inhalers as needed  I will follow-up with you in about a year  Call with any significant concerns
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
## 2020-10-08 ENCOUNTER — Encounter: Payer: Self-pay | Admitting: *Deleted

## 2020-10-08 ENCOUNTER — Encounter: Payer: Self-pay | Admitting: Family Medicine

## 2020-10-08 NOTE — Progress Notes (Signed)
Jaime Chapman, I tried to print but not sure I was successful as I am on my home computer.  If it has not printed, can you try from your computer and I will sign?  I have [place] pt but there is another form as well for her to pick up that I will place in your in box.

## 2020-12-08 ENCOUNTER — Other Ambulatory Visit: Payer: Self-pay | Admitting: Family Medicine

## 2020-12-27 ENCOUNTER — Other Ambulatory Visit: Payer: Self-pay | Admitting: Family Medicine

## 2021-01-15 ENCOUNTER — Ambulatory Visit (INDEPENDENT_AMBULATORY_CARE_PROVIDER_SITE_OTHER): Payer: 59 | Admitting: Family Medicine

## 2021-01-15 ENCOUNTER — Other Ambulatory Visit: Payer: Self-pay

## 2021-01-15 ENCOUNTER — Encounter: Payer: Self-pay | Admitting: Family Medicine

## 2021-01-15 VITALS — BP 160/80 | HR 97 | Temp 98.3°F | Ht 64.5 in | Wt 205.1 lb

## 2021-01-15 DIAGNOSIS — R21 Rash and other nonspecific skin eruption: Secondary | ICD-10-CM

## 2021-01-15 MED ORDER — TRIAMCINOLONE ACETONIDE 0.1 % EX CREA: 1.0000 "application " | TOPICAL_CREAM | Freq: Two times a day (BID) | CUTANEOUS | 0 refills | Status: DC

## 2021-01-15 NOTE — Progress Notes (Signed)
Jaime Chapman T. Jaime Scannell, MD, [place] at [facility] [place], [address]  Phone: [phone]  FAX: [place] - 60 y.o. female  MRN [id number]  Date of Birth: [birth year]  Date: 01/15/2021  PCP: Jinny Sanders, MD  Referral: Jinny Sanders, MD  Chief Complaint  Patient presents with  . Skin Irritation    Red and Itchy from neck down to chest    This visit occurred during the SARS-CoV-2 public health emergency.  Safety protocols were in place, including screening questions prior to the visit, additional usage of staff PPE, and extensive cleaning of exam room while observing appropriate contact time as indicated for disinfecting solutions.   Subjective:   Jaime Chapman is a 60 y.o. very pleasant female patient with Body mass index is 34.67 kg/m. who presents with the following:  She has a new onset rash.  She is a pleasant 60 year old patient, she recently had a gastric sleeve on December 22, 2020.  She has a new highly pruritic rash that is somewhat red that started on her face and cheeks and now spread down her neck to the sternal region and to some degree around her breast.  The rash has started to resolve some around her face and cheeks.  Chest and really face down to th eneck.  Very itchy.  Very dry and like sandpaper.    10/24 had bariatric.   No new meds.  Nothing new or different medications.  Did an oatmeal bath.    Review of Systems is noted in the HPI, as appropriate  Objective:   BP (!) 160/80   Pulse 97   Temp 98.3 F (36.8 C) (Temporal)   Ht 5' 4.5" (1.638 m)   Wt 205 lb 2 oz (93 kg)   SpO2 97%   BMI 34.67 kg/m   GEN: No acute distress; alert,appropriate. PULM: Breathing comfortably in no respiratory distress PSYCH: Normally interactive.   Slightly coarse appearing pinkish rash worse on the anterior chest and the neck and to a lesser extent on the cheeks.  Laboratory  and Imaging Data:  Assessment and Plan:     ICD-10-CM   1. Rash  R21      It looks like an allergic dermatitis, but we do not have any kind of known contact.  Does not have the appearance of fungal rash.  I will place the patient on some Kenalog.  I would like to avoid steroids if possible given her recent gastric sleeve.  Meds ordered this encounter  Medications  . triamcinolone cream (KENALOG) 0.1 %    Sig: Apply 1 application topically 2 (two) times daily.    Dispense:  454 g    Refill:  0   Medications Discontinued During This Encounter  Medication Reason  . clobetasol ointment (TEMOVATE) 0.05 % Completed Course  . hydrochlorothiazide (HYDRODIURIL) 25 MG tablet Completed Course  . hydrOXYzine (ATARAX/VISTARIL) 10 MG tablet Completed Course  . methylphenidate (METADATE CD) 10 MG CR capsule Completed Course  . montelukast (SINGULAIR) 10 MG tablet Completed Course  . ARMOUR THYROID 90 MG tablet Completed Course  . losartan (COZAAR) 100 MG tablet Completed Course   No orders of the defined types were placed in this encounter.   Follow-up: No follow-ups on file.  Dragon [employer] One speech-to-text software was used for transcription in this dictation.  Possible transcriptional errors can occur using Editor, commissioning.   Signed,  Frederico Hamman  Celedonio Savage, MD   Outpatient Encounter Medications as of 01/15/2021  Medication Sig  . albuterol (VENTOLIN HFA) 108 (90 Base) MCG/ACT inhaler INHALE 2 PUFFS INTO THE LUNGS EVERY 4 HOURS AS NEEDED FOR WHEEIZNG  . estradiol (VIVELLE-DOT) 0.1 MG/24HR patch Place 1 patch onto the skin 2 (two) times a week.  . levothyroxine (SYNTHROID) 25 MCG tablet Take 25 mcg by mouth daily.  [name] omeprazole (PRILOSEC) 40 MG capsule Take by mouth.  . progesterone (PROMETRIUM) 200 MG capsule Take 200 mg by mouth daily.  [name] triamcinolone cream (KENALOG) 0.1 % Apply 1 application topically 2 (two) times daily.  . ursodiol (ACTIGALL) 300 MG capsule Take 300 mg by  mouth 2 (two) times daily.  [name] venlafaxine XR (EFFEXOR-XR) 75 MG 24 hr capsule TAKE 1 CAPSULE BY MOUTH DAILY WITH BREAKFAST.  . [DISCONTINUED] ARMOUR THYROID 90 MG tablet Take 90 mg by mouth daily.  . [DISCONTINUED] clobetasol ointment (TEMOVATE) 0.05 % Apply topically daily as needed.  . [DISCONTINUED] hydrochlorothiazide (HYDRODIURIL) 25 MG tablet TAKE 1 TABLET BY MOUTH EVERY DAY (TAKE WITH LOSARTAN)  . [DISCONTINUED] hydrOXYzine (ATARAX/VISTARIL) 10 MG tablet hydroxyzine HCl 10 mg tablet  TAKE 1 TABLET (10 MG TOTAL) BY MOUTH 3 (THREE) TIMES DAILY AS NEEDED FOR ANXIETY (PANIC ATTACK).  . [DISCONTINUED] losartan (COZAAR) 100 MG tablet TAKE 1 TABLET BY MOUTH EVERY DAY (TAKE WITH HCTZ)  . [DISCONTINUED] methylphenidate (METADATE CD) 10 MG CR capsule Take 1 capsule (10 mg total) by mouth every morning. Fill after 08/08/20  . [DISCONTINUED] montelukast (SINGULAIR) 10 MG tablet Take 1 tablet (10 mg total) by mouth at bedtime.   No facility-administered encounter medications on file as of 01/15/2021.

## 2021-04-01 ENCOUNTER — Other Ambulatory Visit: Payer: Self-pay | Admitting: Family Medicine

## 2021-04-21 ENCOUNTER — Encounter: Payer: Self-pay | Admitting: Family Medicine

## 2021-04-21 ENCOUNTER — Ambulatory Visit (INDEPENDENT_AMBULATORY_CARE_PROVIDER_SITE_OTHER): Payer: BC Managed Care – PPO | Admitting: Family Medicine

## 2021-04-21 ENCOUNTER — Other Ambulatory Visit: Payer: Self-pay

## 2021-04-21 VITALS — BP 128/76 | HR 71 | Temp 98.0°F | Ht 64.5 in | Wt 187.0 lb

## 2021-04-21 DIAGNOSIS — R7303 Prediabetes: Secondary | ICD-10-CM | POA: Diagnosis not present

## 2021-04-21 DIAGNOSIS — E661 Drug-induced obesity: Secondary | ICD-10-CM

## 2021-04-21 DIAGNOSIS — E6609 Other obesity due to excess calories: Secondary | ICD-10-CM

## 2021-04-21 DIAGNOSIS — F411 Generalized anxiety disorder: Secondary | ICD-10-CM

## 2021-04-21 DIAGNOSIS — F988 Other specified behavioral and emotional disorders with onset usually occurring in childhood and adolescence: Secondary | ICD-10-CM

## 2021-04-21 DIAGNOSIS — Z6831 Body mass index (BMI) 31.0-31.9, adult: Secondary | ICD-10-CM

## 2021-04-21 DIAGNOSIS — Z6837 Body mass index (BMI) 37.0-37.9, adult: Secondary | ICD-10-CM

## 2021-04-21 DIAGNOSIS — I10 Essential (primary) hypertension: Secondary | ICD-10-CM

## 2021-04-21 DIAGNOSIS — F331 Major depressive disorder, recurrent, moderate: Secondary | ICD-10-CM

## 2021-04-21 LAB — POCT GLYCOSYLATED HEMOGLOBIN (HGB A1C): Hemoglobin A1C: 5.5 % (ref 4.0–5.6)

## 2021-04-21 MED ORDER — VENLAFAXINE HCL 25 MG PO TABS: 25.0000 mg | ORAL_TABLET | Freq: Two times a day (BID) | ORAL | 3 refills | Status: DC

## 2021-04-21 NOTE — Progress Notes (Signed)
Patient ID: Jaime Chapman, female    DOB: 11-04-1960, 61 y.o.   MRN: [id number]  This visit was conducted in person.  BP 128/76 (BP Location: Left Arm, Patient Position: Sitting, Cuff Size: Large)    Pulse 71    Temp 98 F (36.7 C)    Ht 5' 4.5" (1.638 m)    Wt 187 lb (84.8 kg)    SpO2 98%    BMI 31.60 kg/m    CC:  Chief Complaint  Patient presents with   Bariatric Surgery Follow-up    Surgery date 12-22-20. Wonders about blood sugar.   Depression and ADD Issues    Stopped taking Effexor.     Subjective:   HPI: Jaime Chapman is a 61 y.o. female presenting on 04/21/2021 for Bariatric Surgery Follow-up (Surgery date 12-22-20. Wonders about blood sugar.) and Depression and ADD Issues (Stopped taking Effexor. )   S/P bariatric surgery 12/22/2020 .[name] vertical sleeve gastrectomy.. Dr. Evorn Gong with [facility].  She has lost 30 lbs in last 4 months. Wt Readings from Last 3 Encounters:  04/21/21 187 lb (84.8 kg)  01/15/21 205 lb 2 oz (93 kg)  10/01/20 212 lb 9.6 oz (96.4 kg)   Body mass index is 31.6 kg/m.     MDD/GAD.[name] no longer taking effexor XR... has had worsening of her mood in last month.   Not seeing a counselor.  She has been very anxious and worries. Having trouble focusing and concentrating.  Very irritable.  Occ SI , but no plan. Weight gain from cymbalta, lexapro ineffective. GAD 7 : Generalized Anxiety Score 04/21/2021 06/03/2020 05/06/2020 05/07/2016  Nervous, Anxious, on Edge 3 0 1 0  Control/stop worrying 3 0 1 0  Worry too much - different things 3 0 1 0  Trouble relaxing 3 0 1 0  Restless 2 0 0 0  Easily annoyed or irritable 3 0 0 0  Afraid - awful might happen 2 0 0 0  Total GAD 7 Score 19 0 4 0  Anxiety Difficulty Very difficult Not difficult at all - -    PHQ9 SCORE ONLY 04/21/2021 09/16/2020 06/03/2020  PHQ-9 Total Score 20 9 5      Relevant past medical, surgical, family and social history reviewed and updated as indicated. Interim medical history since  our last visit reviewed. Allergies and medications reviewed and updated. Outpatient Medications Prior to Visit  Medication Sig Dispense Refill   albuterol (VENTOLIN HFA) 108 (90 Base) MCG/ACT inhaler INHALE 2 PUFFS INTO THE LUNGS EVERY 4 HOURS AS NEEDED FOR WHEEIZNG 18 g 3   estradiol (VIVELLE-DOT) 0.1 MG/24HR patch Place 1 patch onto the skin 2 (two) times a week.     levothyroxine (SYNTHROID) 25 MCG tablet Take 25 mcg by mouth daily.     progesterone (PROMETRIUM) 200 MG capsule Take 200 mg by mouth daily.     ursodiol (ACTIGALL) 300 MG capsule Take 300 mg by mouth 2 (two) times daily.     omeprazole (PRILOSEC) 40 MG capsule Take by mouth.     triamcinolone cream (KENALOG) 0.1 % Apply 1 application topically 2 (two) times daily. 454 g 0   venlafaxine XR (EFFEXOR-XR) 75 MG 24 hr capsule TAKE 1 CAPSULE BY MOUTH DAILY WITH BREAKFAST. 90 capsule 1   No facility-administered medications prior to visit.     Per HPI unless specifically indicated in ROS section below Review of Systems  Constitutional:  Negative for fatigue and fever.  HENT:  Negative for congestion.  Eyes:  Negative for pain.  Respiratory:  Negative for cough and shortness of breath.   Cardiovascular:  Negative for chest pain, palpitations and leg swelling.  Gastrointestinal:  Negative for abdominal pain.  Genitourinary:  Negative for dysuria and vaginal bleeding.  Musculoskeletal:  Negative for back pain.  Neurological:  Negative for syncope, light-headedness and headaches.  Psychiatric/Behavioral:  Positive for decreased concentration and dysphoric mood.   Objective:  BP 128/76 (BP Location: Left Arm, Patient Position: Sitting, Cuff Size: Large)    Pulse 71    Temp 98 F (36.7 C)    Ht 5' 4.5" (1.638 m)    Wt 187 lb (84.8 kg)    SpO2 98%    BMI 31.60 kg/m   Wt Readings from Last 3 Encounters:  04/21/21 187 lb (84.8 kg)  01/15/21 205 lb 2 oz (93 kg)  10/01/20 212 lb 9.6 oz (96.4 kg)      Physical  Exam Constitutional:      General: She is not in acute distress.    Appearance: Normal appearance. She is well-developed. She is not ill-appearing or toxic-appearing.  HENT:     Head: Normocephalic.     Right Ear: Hearing, tympanic membrane, ear canal and external ear normal. Tympanic membrane is not erythematous, retracted or bulging.     Left Ear: Hearing, tympanic membrane, ear canal and external ear normal. Tympanic membrane is not erythematous, retracted or bulging.     Nose: No mucosal edema or rhinorrhea.     Right Sinus: No maxillary sinus tenderness or frontal sinus tenderness.     Left Sinus: No maxillary sinus tenderness or frontal sinus tenderness.     Mouth/Throat:     Pharynx: Uvula midline.  Eyes:     General: Lids are normal. Lids are everted, no foreign bodies appreciated.     Conjunctiva/sclera: Conjunctivae normal.     Pupils: Pupils are equal, round, and reactive to light.  Neck:     Thyroid: No thyroid mass or thyromegaly.     Vascular: No carotid bruit.     Trachea: Trachea normal.  Cardiovascular:     Rate and Rhythm: Normal rate and regular rhythm.     Pulses: Normal pulses.     Heart sounds: Normal heart sounds, S1 normal and S2 normal. No murmur heard.   No friction rub. No gallop.  Pulmonary:     Effort: Pulmonary effort is normal. No tachypnea or respiratory distress.     Breath sounds: Normal breath sounds. No decreased breath sounds, wheezing, rhonchi or rales.  Abdominal:     General: Bowel sounds are normal.     Palpations: Abdomen is soft.     Tenderness: There is no abdominal tenderness.  Musculoskeletal:     Cervical back: Normal range of motion and neck supple.  Skin:    General: Skin is warm and dry.     Findings: No rash.  Neurological:     Mental Status: She is alert.  Psychiatric:        Mood and Affect: Mood is not anxious or depressed.        Speech: Speech normal.        Behavior: Behavior normal. Behavior is cooperative.         Thought Content: Thought content normal.        Judgment: Judgment normal.      Results for orders placed or performed in visit on 09/17/20  HM MAMMOGRAPHY  Result Value Ref Range   HM  Mammogram 0-4 Bi-Rad 0-4 Bi-Rad, Self Reported Normal  HM PAP SMEAR  Result Value Ref Range   HM Pap smear Negative for Intraepithelial Lesions or Malignancy     This visit occurred during the SARS-CoV-2 public health emergency.  Safety protocols were in place, including screening questions prior to the visit, additional usage of staff PPE, and extensive cleaning of exam room while observing appropriate contact time as indicated for disinfecting solutions.   COVID 19 screen:  No recent travel or known exposure to COVID19 The patient denies respiratory symptoms of COVID 19 at this time. The importance of social distancing was discussed today.   Assessment and Plan     Problem List Items Addressed This Visit     Attention deficit disorder (ADD) without hyperactivity   Class 1 drug-induced obesity with serious comorbidity and body mass index (BMI) of 31.0 to 31.9 in adult - Primary    S/P bariatric surgery 12/22/2020 .[name] vertical sleeve gastrectomy.. Dr. Evorn Gong with [facility].      Essential hypertension    BP Readings from Last 3 Encounters:  04/21/21 128/76  01/15/21 (!) 160/80  10/01/20 124/80   Chronic, resolved after surgery on no medication.      Generalized anxiety disorder    Chronic, poor control   Restart venlafaxine as discussed      Relevant Medications   venlafaxine (EFFEXOR) 25 MG tablet   Moderate episode of recurrent major depressive disorder (HCC)    Chronic, recurrent, worsened control  Start low dose  venlafaxine twice daily.  Call if interested in referral for counselor.      Relevant Medications   venlafaxine (EFFEXOR) 25 MG tablet   Prediabetes    Chronic, due for re-eval S/P bariatric surgery.      Relevant Orders   HgB A1c (Completed)         Eliezer Lofts, MD

## 2021-04-21 NOTE — Patient Instructions (Signed)
Start low dose  venlafaxine twice daily.  Call if interested in referral for counselor.

## 2021-04-30 DIAGNOSIS — H40033 Anatomical narrow angle, bilateral: Secondary | ICD-10-CM | POA: Diagnosis not present

## 2021-04-30 DIAGNOSIS — H2513 Age-related nuclear cataract, bilateral: Secondary | ICD-10-CM | POA: Diagnosis not present

## 2021-05-11 NOTE — Assessment & Plan Note (Signed)
Chronic, due for re-eval S/P bariatric surgery. ?

## 2021-05-11 NOTE — Assessment & Plan Note (Signed)
S/P bariatric surgery 12/22/2020 .[name] vertical sleeve gastrectomy.. Dr. Evorn Gong with [facility]. ?

## 2021-05-11 NOTE — Assessment & Plan Note (Signed)
Chronic, recurrent, worsened control ? ?Start low dose  venlafaxine twice daily. ? Call if interested in referral for counselor. ?

## 2021-05-11 NOTE — Assessment & Plan Note (Signed)
BP Readings from Last 3 Encounters:  ?04/21/21 128/76  ?01/15/21 (!) 160/80  ?10/01/20 124/80  ? ? Chronic, resolved after surgery on no medication. ?

## 2021-05-11 NOTE — Assessment & Plan Note (Signed)
Chronic, poor control ? ? Restart venlafaxine as discussed ?

## 2021-05-21 ENCOUNTER — Other Ambulatory Visit: Payer: Self-pay | Admitting: Family Medicine

## 2021-06-10 ENCOUNTER — Ambulatory Visit (INDEPENDENT_AMBULATORY_CARE_PROVIDER_SITE_OTHER): Payer: BC Managed Care – PPO | Admitting: Family

## 2021-06-10 ENCOUNTER — Encounter: Payer: Self-pay | Admitting: Family

## 2021-06-10 VITALS — BP 134/74 | HR 87 | Temp 98.3°F | Resp 16 | Ht 64.5 in | Wt 180.4 lb

## 2021-06-10 DIAGNOSIS — R300 Dysuria: Secondary | ICD-10-CM | POA: Diagnosis not present

## 2021-06-10 DIAGNOSIS — N3001 Acute cystitis with hematuria: Secondary | ICD-10-CM | POA: Insufficient documentation

## 2021-06-10 LAB — POC URINALSYSI DIPSTICK (AUTOMATED)
Bilirubin, UA: NEGATIVE
Glucose, UA: NEGATIVE
Ketones, UA: POSITIVE
Nitrite, UA: NEGATIVE
Protein, UA: POSITIVE — AB
Spec Grav, UA: >=1.03 — AB (ref 1.010–1.025)
Urobilinogen, UA: NEGATIVE E.U./dL — AB
pH, UA: 5 (ref 5.0–8.0)

## 2021-06-10 MED ORDER — NITROFURANTOIN MONOHYD MACRO 100 MG PO CAPS: 100.0000 mg | ORAL_CAPSULE | Freq: Two times a day (BID) | ORAL | 0 refills | Status: AC

## 2021-06-10 NOTE — Patient Instructions (Signed)
It was a pleasure seeing you today.   You were found to have a urinary tract infection, you have been prescribed an antibiotic to your preferred pharmacy. Please start antibiotic today as directed.   We are sending your urine for a culture to make sure you do not have a resistant bacteria. We will call you if we need to change your medications.   Please make sure you are drinking plenty of fluids over the next few days.  If your symptoms do not improve over the next 5-7 days, or if they worsen, please let us know. Please also let us know if you have worsening back pain, fevers, chills, or body aches.   Regards,   Merilyn Pagan  

## 2021-06-10 NOTE — Progress Notes (Signed)
? ?Established Patient Office Visit ? ?Subjective:  ?Patient ID: Jaime Chapman, female    DOB: August 17, 1960  Age: 61 y.o. MRN: [id number] ? ?CC:  ?Chief Complaint  ?Patient presents with  ? Urinary Frequency  ?  Started X 3 days when she wiped it was pink. Also burns when she urination.  ? ? ?HPI ?Jaime Chapman is here today with concerns.  ? ?Three days ago with lower pelvic pressure, urinary frequency and urgency. This am woke up and noticed pink when she wiped. Also with dysuria. No vaginal discharge. No vaginal itching. No flank pain. Also foul odor with urine.  ? ?No fever no chills. No flank pain ? ?Past Medical History:  ?Diagnosis Date  ? Allergic rhinitis   ? Anxiety   ? Asthma   ? DDD (degenerative disc disease)   ? also has spinal arthritis  ? Depression   ? Diabetes mellitus   ? borderline  ? Dyslipidemia   ? Fibromyalgia   ? GERD (gastroesophageal reflux disease)   ? Lung nodules   ? RIGHT LUNG--LAST EVALUATED BY CHEST CT 02/01/12 - STABLE AND FOLLOW UP PLANNED IN ONE YEAR.  ? Lyme disease   ? Obesity   ? OSA (obstructive sleep apnea) 05/10/2017  ? Sinusitis   ? Stress disorder, acute   ? Vitamin D deficiency   ? ? ?Past Surgical History:  ?Procedure Laterality Date  ? [place]  ? x 2   ? ENDOMETRIAL ABLATION  2009  ? HERNIA REPAIR  04/14/12  ? ventral hernia repair  ? Laparoscopic ventral hernia repair w/ mesh  08/2009  ? Dr. Grandville Silos, [place]  ? LAPAROSCOPY  2002  ? TUBAL LIGATION    ? VENTRAL HERNIA REPAIR  09/2006  ? Dr. Grandville Silos  ? VENTRAL HERNIA REPAIR N/A 04/14/2012  ? Procedure: Laparoscopic Repair of  Ventral Hernia;  Surgeon: Pedro Earls, MD;  Location: [facility];  Service: General;  Laterality: N/A;  Repair of Recurrent Ventral Hernia  ? ? ?Family History  ?Problem Relation Age of Onset  ? Diabetes Mother   ? Heart disease Mother 61  ? Arthritis Mother   ? Lung cancer Maternal Grandmother   ? Cancer Maternal Grandmother   ?     lung  ? Colon cancer Neg Hx   ? ? ?Social History   ? ?Socioeconomic History  ? Marital status: Married  ?  Spouse name: Simona Huh  ? Number of children: 2  ? Years of education: Not on file  ? Highest education level: Not on file  ?Occupational History  ? Occupation: Corporate treasurer  ?Tobacco Use  ? Smoking status: Former  ?  Types: Cigarettes  ?  Quit date: 03/01/2004  ?  Years since quitting: 17.2  ?  Passive exposure: Past  ? Smokeless tobacco: Never  ?Vaping Use  ? Vaping Use: Never used  ?Substance and Sexual Activity  ? Alcohol use: No  ?  Alcohol/week: 1.0 standard drink  ?  Types: 1 Standard drinks or equivalent per week  ? Drug use: No  ?  Comment: remote [place]  ? Sexual activity: Yes  ?  Partners: Male  ?Other Topics Concern  ? Not on file  ?Social History Narrative  ? 2 caffeine drinks daily   ?  Married  ? 2 grown daughters, one is a crack addict and is in jail, she cares for her grandchildren.  ? ?Social Determinants of Health  ? ?Financial Resource Strain:  Not on file  ?Food Insecurity: Not on file  ?Transportation Needs: Not on file  ?Physical Activity: Not on file  ?Stress: Not on file  ?Social Connections: Not on file  ?Intimate Partner Violence: Not on file  ? ? ?Outpatient Medications Prior to Visit  ?Medication Sig Dispense Refill  ? albuterol (VENTOLIN HFA) 108 (90 Base) MCG/ACT inhaler INHALE 2 PUFFS INTO THE LUNGS EVERY 4 HOURS AS NEEDED FOR WHEEIZNG 18 g 3  ? estradiol (VIVELLE-DOT) 0.1 MG/24HR patch Place 1 patch onto the skin 2 (two) times a week.    ? levothyroxine (SYNTHROID) 25 MCG tablet Take 25 mcg by mouth daily.    ? progesterone (PROMETRIUM) 200 MG capsule Take 200 mg by mouth daily.    ? ursodiol (ACTIGALL) 300 MG capsule Take 300 mg by mouth 2 (two) times daily.    ? venlafaxine (EFFEXOR) 25 MG tablet TAKE 1 TABLET BY MOUTH TWICE A DAY 180 tablet 0  ? ?No facility-administered medications prior to visit.  ? ? ?Allergies  ?Allergen Reactions  ? Progesterone   ?  Stroke-like symptoms  ? Codeine   ?  REACTION:  nausea,vomiting,dizziness  ? Cymbalta [Duloxetine Hcl]   ?  Pt states it caused weight gain  ? ? ?ROS ?Review of Systems  ?Constitutional:  Negative for chills, fatigue and fever.  ?Gastrointestinal:  Negative for abdominal pain.  ?Genitourinary:  Positive for dysuria, frequency, pelvic pain and urgency. Negative for difficulty urinating, flank pain, hematuria and vaginal discharge.  ?Musculoskeletal:  Negative for arthralgias.  ? ?  ?Objective:  ?  ?Physical Exam ?Constitutional:   ?   General: She is not in acute distress. ?   Appearance: She is obese. She is not ill-appearing, toxic-appearing or diaphoretic.  ?Cardiovascular:  ?   Rate and Rhythm: Normal rate and regular rhythm.  ?Pulmonary:  ?   Effort: Pulmonary effort is normal.  ?   Breath sounds: Normal breath sounds.  ?Abdominal:  ?   General: Abdomen is flat.  ?   Tenderness: There is no abdominal tenderness.  ?Neurological:  ?   General: No focal deficit present.  ?   Mental Status: She is alert and oriented to person, place, and time.  ?Psychiatric:     ?   Mood and Affect: Mood normal.     ?   Behavior: Behavior normal.     ?   Thought Content: Thought content normal.     ?   Judgment: Judgment normal.  ? ? ?BP 134/74   Pulse 87   Temp 98.3 ?F (36.8 ?C)   Resp 16   Ht 5' 4.5" (1.638 m)   Wt 180 lb 6 oz (81.8 kg)   SpO2 97%   BMI 30.48 kg/m?  ?Wt Readings from Last 3 Encounters:  ?06/10/21 180 lb 6 oz (81.8 kg)  ?04/21/21 187 lb (84.8 kg)  ?01/15/21 205 lb 2 oz (93 kg)  ? ? ? ?Health Maintenance Due  ?Topic Date Due  ? COVID-19 Vaccine (4 - Booster for Moderna series) 10/14/2020  ? COLONOSCOPY (Pts 45-45yr Insurance coverage will need to be confirmed)  11/19/2020  ? ? ?There are no preventive care reminders to display for this patient. ? ?Lab Results  ?Component Value Date  ? TSH 0.58 09/09/2020  ? ?Lab Results  ?Component Value Date  ? WBC 5.6 09/09/2020  ? HGB 13.3 09/09/2020  ? HCT 38.8 09/09/2020  ? MCV 85.8 09/09/2020  ? PLT 245.0 09/09/2020   ? ?Lab  Results  ?Component Value Date  ? NA 139 09/09/2020  ? K 4.0 09/09/2020  ? CO2 28 09/09/2020  ? GLUCOSE 131 (H) 09/09/2020  ? BUN 21 09/09/2020  ? CREATININE 0.97 09/09/2020  ? BILITOT 0.4 09/09/2020  ? ALKPHOS 50 09/09/2020  ? AST 15 09/09/2020  ? ALT 17 09/09/2020  ? PROT 6.8 09/09/2020  ? ALBUMIN 4.2 09/09/2020  ? CALCIUM 9.4 09/09/2020  ? ANIONGAP 11 03/24/2020  ? GFR 64.06 09/09/2020  ? ?Lab Results  ?Component Value Date  ? HGBA1C 5.5 04/21/2021  ? ? ?  ?Assessment & Plan:  ? ?Problem List Items Addressed This Visit   ? ?  ? Genitourinary  ? Acute cystitis with hematuria  ?  rx macrobid 500 mg  ?antbx sent to pharmacy, pt to take as directed. Encouraged increased water intake throughout the day. Urine culture/reflex pending results. Choosing to treat due to being symptomatic. If no improvement in the next 2 days pt advised to let me know. ? ?  ?  ? Relevant Medications  ? nitrofurantoin, macrocrystal-monohydrate, (MACROBID) 100 MG capsule  ? Other Relevant Orders  ? POCT Urinalysis Dipstick (Automated)  ? Urine Culture  ?  ? Other  ? Dysuria - Primary  ?  poct urine dipstick in office today ?Urine culture ordered ?  ?  ? Relevant Medications  ? nitrofurantoin, macrocrystal-monohydrate, (MACROBID) 100 MG capsule  ? Other Relevant Orders  ? POCT Urinalysis Dipstick (Automated)  ? Urine Culture  ? ? ?Meds ordered this encounter  ?Medications  ? nitrofurantoin, macrocrystal-monohydrate, (MACROBID) 100 MG capsule  ?  Sig: Take 1 capsule (100 mg total) by mouth 2 (two) times daily for 7 days.  ?  Dispense:  14 capsule  ?  Refill:  0  ?  Order Specific Question:   Supervising Provider  ?  Answer:   BEDSOLE, [name] E [[id number]]  ? ? ?Follow-up: Return if symptoms worsen or fail to improve with pcp.  ? ? ?Eugenia Pancoast, FNP ?

## 2021-06-10 NOTE — Assessment & Plan Note (Signed)
poct urine dipstick in office today ?Urine culture ordered ?

## 2021-06-10 NOTE — Assessment & Plan Note (Signed)
rx macrobid 500 mg  ?antbx sent to pharmacy, pt to take as directed. Encouraged increased water intake throughout the day. Urine culture/reflex pending results. Choosing to treat due to being symptomatic. If no improvement in the next 2 days pt advised to let me know. ? ?

## 2021-06-11 LAB — URINE CULTURE
MICRO NUMBER:: 13254179
Result:: NO GROWTH
SPECIMEN QUALITY:: ADEQUATE

## 2021-06-18 DIAGNOSIS — Z9884 Bariatric surgery status: Secondary | ICD-10-CM | POA: Diagnosis not present

## 2021-06-18 DIAGNOSIS — K912 Postsurgical malabsorption, not elsewhere classified: Secondary | ICD-10-CM | POA: Diagnosis not present

## 2021-06-18 DIAGNOSIS — Z903 Acquired absence of stomach [part of]: Secondary | ICD-10-CM | POA: Diagnosis not present

## 2021-06-19 DIAGNOSIS — Z03818 Encounter for observation for suspected exposure to other biological agents ruled out: Secondary | ICD-10-CM | POA: Diagnosis not present

## 2021-06-19 DIAGNOSIS — Z20822 Contact with and (suspected) exposure to covid-19: Secondary | ICD-10-CM | POA: Diagnosis not present

## 2021-06-25 DIAGNOSIS — N951 Menopausal and female climacteric states: Secondary | ICD-10-CM | POA: Diagnosis not present

## 2021-06-25 DIAGNOSIS — R5383 Other fatigue: Secondary | ICD-10-CM | POA: Diagnosis not present

## 2021-06-25 DIAGNOSIS — E039 Hypothyroidism, unspecified: Secondary | ICD-10-CM | POA: Diagnosis not present

## 2021-06-25 DIAGNOSIS — R7309 Other abnormal glucose: Secondary | ICD-10-CM | POA: Diagnosis not present

## 2021-07-24 DIAGNOSIS — N951 Menopausal and female climacteric states: Secondary | ICD-10-CM | POA: Diagnosis not present

## 2021-07-24 DIAGNOSIS — E559 Vitamin D deficiency, unspecified: Secondary | ICD-10-CM | POA: Diagnosis not present

## 2021-07-24 DIAGNOSIS — I10 Essential (primary) hypertension: Secondary | ICD-10-CM | POA: Diagnosis not present

## 2021-07-24 DIAGNOSIS — E039 Hypothyroidism, unspecified: Secondary | ICD-10-CM | POA: Diagnosis not present

## 2021-08-28 ENCOUNTER — Other Ambulatory Visit: Payer: Self-pay | Admitting: Family Medicine

## 2021-08-28 NOTE — Telephone Encounter (Signed)
Last office visit 06/10/21 with Dugal for dysuria.  Last refilled 05/21/21 for #180.  When seen by PCP on 04/21/2021 AVS states to follow up in 4 week for mood.  No future appointments with PCP.  Refill?

## 2021-10-07 ENCOUNTER — Observation Stay (HOSPITAL_COMMUNITY)
Admission: EM | Admit: 2021-10-07 | Discharge: 2021-10-08 | Disposition: A | Discharge status: Home / Self Care | Payer: BC Managed Care – PPO | Attending: Internal Medicine | Admitting: Internal Medicine

## 2021-10-07 ENCOUNTER — Emergency Department (HOSPITAL_COMMUNITY): Payer: BC Managed Care – PPO

## 2021-10-07 ENCOUNTER — Other Ambulatory Visit: Payer: Self-pay

## 2021-10-07 ENCOUNTER — Encounter (HOSPITAL_COMMUNITY): Payer: Self-pay | Admitting: Emergency Medicine

## 2021-10-07 DIAGNOSIS — Z87891 Personal history of nicotine dependence: Secondary | ICD-10-CM | POA: Diagnosis not present

## 2021-10-07 DIAGNOSIS — R29818 Other symptoms and signs involving the nervous system: Secondary | ICD-10-CM | POA: Diagnosis not present

## 2021-10-07 DIAGNOSIS — E039 Hypothyroidism, unspecified: Secondary | ICD-10-CM | POA: Insufficient documentation

## 2021-10-07 DIAGNOSIS — I629 Nontraumatic intracranial hemorrhage, unspecified: Secondary | ICD-10-CM | POA: Diagnosis not present

## 2021-10-07 DIAGNOSIS — M542 Cervicalgia: Secondary | ICD-10-CM | POA: Diagnosis not present

## 2021-10-07 DIAGNOSIS — I160 Hypertensive urgency: Principal | ICD-10-CM | POA: Insufficient documentation

## 2021-10-07 DIAGNOSIS — R4020 Unspecified coma: Secondary | ICD-10-CM | POA: Diagnosis not present

## 2021-10-07 DIAGNOSIS — J45909 Unspecified asthma, uncomplicated: Secondary | ICD-10-CM | POA: Insufficient documentation

## 2021-10-07 DIAGNOSIS — E119 Type 2 diabetes mellitus without complications: Secondary | ICD-10-CM | POA: Insufficient documentation

## 2021-10-07 DIAGNOSIS — R531 Weakness: Secondary | ICD-10-CM | POA: Insufficient documentation

## 2021-10-07 DIAGNOSIS — H538 Other visual disturbances: Secondary | ICD-10-CM | POA: Diagnosis not present

## 2021-10-07 DIAGNOSIS — Z7722 Contact with and (suspected) exposure to environmental tobacco smoke (acute) (chronic): Secondary | ICD-10-CM | POA: Insufficient documentation

## 2021-10-07 DIAGNOSIS — Z79899 Other long term (current) drug therapy: Secondary | ICD-10-CM | POA: Diagnosis not present

## 2021-10-07 DIAGNOSIS — I639 Cerebral infarction, unspecified: Secondary | ICD-10-CM | POA: Insufficient documentation

## 2021-10-07 DIAGNOSIS — J341 Cyst and mucocele of nose and nasal sinus: Secondary | ICD-10-CM | POA: Diagnosis not present

## 2021-10-07 DIAGNOSIS — I10 Essential (primary) hypertension: Secondary | ICD-10-CM | POA: Insufficient documentation

## 2021-10-07 DIAGNOSIS — R4182 Altered mental status, unspecified: Secondary | ICD-10-CM | POA: Insufficient documentation

## 2021-10-07 HISTORY — DX: Essential (primary) hypertension: I10

## 2021-10-07 LAB — DIFFERENTIAL
Abs Immature Granulocytes: 0.02 10*3/uL (ref 0.00–0.07)
Basophils Absolute: 0 10*3/uL (ref 0.0–0.1)
Basophils Relative: 1 %
Eosinophils Absolute: 0.2 10*3/uL (ref 0.0–0.5)
Eosinophils Relative: 2 %
Immature Granulocytes: 0 %
Lymphocytes Relative: 33 %
Lymphs Abs: 2.2 10*3/uL (ref 0.7–4.0)
Monocytes Absolute: 0.4 10*3/uL (ref 0.1–1.0)
Monocytes Relative: 7 %
Neutro Abs: 3.8 10*3/uL (ref 1.7–7.7)
Neutrophils Relative %: 57 %

## 2021-10-07 LAB — I-STAT BETA HCG BLOOD, ED (MC, WL, AP ONLY): I-stat hCG, quantitative: <5 m[IU]/mL (ref <5)

## 2021-10-07 LAB — COMPREHENSIVE METABOLIC PANEL
ALT: 13 U/L (ref 0–44)
AST: 19 U/L (ref 15–41)
Albumin: 4 g/dL (ref 3.5–5.0)
Alkaline Phosphatase: 51 U/L (ref 38–126)
Anion gap: 8 (ref 5–15)
BUN: 20 mg/dL (ref 6–20)
CO2: 25 mmol/L (ref 22–32)
Calcium: 9.5 mg/dL (ref 8.9–10.3)
Chloride: 107 mmol/L (ref 98–111)
Creatinine, Ser: 0.93 mg/dL (ref 0.44–1.00)
GFR, Estimated: >60 mL/min (ref >60)
Glucose, Bld: 117 mg/dL — ABNORMAL HIGH (ref 70–99)
Potassium: 3.8 mmol/L (ref 3.5–5.1)
Sodium: 140 mmol/L (ref 135–145)
Total Bilirubin: 0.6 mg/dL (ref 0.3–1.2)
Total Protein: 6.2 g/dL — ABNORMAL LOW (ref 6.5–8.1)

## 2021-10-07 LAB — I-STAT CHEM 8, ED
BUN: 22 mg/dL — ABNORMAL HIGH (ref 6–20)
Calcium, Ion: 1.11 mmol/L — ABNORMAL LOW (ref 1.15–1.40)
Chloride: 106 mmol/L (ref 98–111)
Creatinine, Ser: 1 mg/dL (ref 0.44–1.00)
Glucose, Bld: 114 mg/dL — ABNORMAL HIGH (ref 70–99)
HCT: 41 % (ref 36.0–46.0)
Hemoglobin: 13.9 g/dL (ref 12.0–15.0)
Potassium: 3.6 mmol/L (ref 3.5–5.1)
Sodium: 140 mmol/L (ref 135–145)
TCO2: 24 mmol/L (ref 22–32)

## 2021-10-07 LAB — CBC
HCT: 41.9 % (ref 36.0–46.0)
Hemoglobin: 13.7 g/dL (ref 12.0–15.0)
MCH: 29.1 pg (ref 26.0–34.0)
MCHC: 32.7 g/dL (ref 30.0–36.0)
MCV: 89.1 fL (ref 80.0–100.0)
Platelets: 230 10*3/uL (ref 150–400)
RBC: 4.7 MIL/uL (ref 3.87–5.11)
RDW: 12.4 % (ref 11.5–15.5)
WBC: 6.6 10*3/uL (ref 4.0–10.5)
nRBC: 0 % (ref 0.0–0.2)

## 2021-10-07 LAB — CBG MONITORING, ED: Glucose-Capillary: 115 mg/dL — ABNORMAL HIGH (ref 70–99)

## 2021-10-07 LAB — ETHANOL: Alcohol, Ethyl (B): <10 mg/dL (ref <10)

## 2021-10-07 LAB — PROTIME-INR
INR: 1 (ref 0.8–1.2)
Prothrombin Time: 12.6 seconds (ref 11.4–15.2)

## 2021-10-07 LAB — APTT: aPTT: 29 seconds (ref 24–36)

## 2021-10-07 LAB — AMMONIA: Ammonia: 33 umol/L (ref 9–35)

## 2021-10-07 MED ORDER — SODIUM CHLORIDE 0.9% FLUSH
3.0000 mL | Freq: Once | INTRAVENOUS | Status: AC
  Administered 2021-10-07: 3 mL via INTRAVENOUS

## 2021-10-07 MED ORDER — SODIUM CHLORIDE 0.9 % IV BOLUS
1000.0000 mL | Freq: Once | INTRAVENOUS | Status: AC
  Administered 2021-10-07: 1000 mL via INTRAVENOUS

## 2021-10-07 MED ORDER — HYDRALAZINE HCL 20 MG/ML IJ SOLN
10.0000 mg | Freq: Once | INTRAMUSCULAR | Status: AC
Start: 2021-10-07 — End: 2021-10-07
  Administered 2021-10-07: 10 mg via INTRAVENOUS
  Filled 2021-10-07: qty 1

## 2021-10-07 NOTE — ED Notes (Signed)
Patient transported to MRI 

## 2021-10-07 NOTE — ED Triage Notes (Signed)
Patient arrived with EMS from home , LSN [id number] this evening , reports sudden onset blurred vision this evening with lethargy/brief confusion , symptoms improved at arrival . Evaluated by EDP/Neurologist at arrival and transported to CT scan .

## 2021-10-07 NOTE — ED Notes (Signed)
Pur-wick in place °

## 2021-10-07 NOTE — Consult Note (Signed)
Neurology Consultation  Reason for Consult: Code stroke-blurred vision, change in mentation Referring Physician: Dr. Lennice Sites  CC: Blurred vision, change in mentation  History is obtained from: Patient, chart  HPI: Jaime Chapman is a 61 y.o. female past medical history of depression, fibromyalgia, chronic Lyme, anxiety, sleep apnea, obesity status post bariatric surgery, presented to the emergency room via EMS as a code stroke for blurred vision, change in mentation. Patient reports normal state of health at 7:40 PM and sometime after that, felt out of sorts, had blurred vision and started feeling unwell.  EMS was called.  On their evaluation, she was very sleepy and drowsy although answering all questions and following most commands but not being able to completely participate in the stroke screen.  Code stroke was activated due to the sudden onset of symptoms.  Systolic blood pressure noted in the high 190s on scene.  She reports that she was on antihypertensives prior to her bariatric surgery but because of the weight loss, her blood pressures had normalized and she had come off of the medications.  It was not until her last visit to her doctor about a month ago and her blood pressures had started to creep up again.  She was prescribed losartan, which she has not started taking yet. She denies any chest pain or shortness of breath.  Denies any current headache or visual symptoms.  Reports feeling better but still drowsy. Denies any dysuria nausea vomiting or abdominal pain.  Denies any sick contacts    LKW: 7:40 PM IV thrombolysis given?: no, symptoms not consistent with stroke Premorbid modified Rankin scale (mRS): 0 0-Completely asymptomatic and back to baseline post-stroke 1-No significant post stroke disability and can perform usual duties with stroke symptoms 2-Slight disability-UNABLE to perform all activities but does not need assistance  3-Moderate disability-requires help  but walks WITHOUT assistance 4-Needs assistance to walk and tend to bodily needs 5-Severe disability-bedridden, incontinent, needs constant attention 6- Death  ROS: Full ROS was performed and is negative except as noted in the HPI.   Past Medical History:  Diagnosis Date   Allergic rhinitis    Anxiety    Asthma    DDD (degenerative disc disease)    also has spinal arthritis   Depression    Diabetes mellitus    borderline   Dyslipidemia    Fibromyalgia    GERD (gastroesophageal reflux disease)    Lung nodules    RIGHT LUNG--LAST EVALUATED BY CHEST CT 02/01/12 - STABLE AND FOLLOW UP PLANNED IN ONE YEAR.   Lyme disease    Obesity    OSA (obstructive sleep apnea) 05/10/2017   Sinusitis    Stress disorder, acute    Vitamin D deficiency      Family History  Problem Relation Age of Onset   Diabetes Mother    Heart disease Mother 46   Arthritis Mother    Lung cancer Maternal Grandmother    Cancer Maternal Grandmother        lung   Colon cancer Neg Hx      Social History:   reports that she quit smoking about 17 years ago. Her smoking use included cigarettes. She has been exposed to tobacco smoke. She has never used smokeless tobacco. She reports that she does not drink alcohol and does not use drugs.  Medications  Current Facility-Administered Medications:    sodium chloride 0.9 % bolus 1,000 mL, 1,000 mL, Intravenous, Once, Curatolo, Adam, DO   sodium chloride flush (  NS) 0.9 % injection 3 mL, 3 mL, Intravenous, Once, Curatolo, Adam, DO  Current Outpatient Medications:    albuterol (VENTOLIN HFA) 108 (90 Base) MCG/ACT inhaler, INHALE 2 PUFFS INTO THE LUNGS EVERY 4 HOURS AS NEEDED FOR WHEEIZNG, Disp: 18 g, Rfl: 3   estradiol (VIVELLE-DOT) 0.1 MG/24HR patch, Place 1 patch onto the skin 2 (two) times a week., Disp: , Rfl:    levothyroxine (SYNTHROID) 25 MCG tablet, Take 25 mcg by mouth daily., Disp: , Rfl:    progesterone (PROMETRIUM) 200 MG capsule, Take 200 mg by mouth  daily., Disp: , Rfl:    ursodiol (ACTIGALL) 300 MG capsule, Take 300 mg by mouth 2 (two) times daily., Disp: , Rfl:    venlafaxine (EFFEXOR) 25 MG tablet, TAKE 1 TABLET BY MOUTH TWICE A DAY, Disp: 180 tablet, Rfl: 0   Exam: Current vital signs: BP (!) 197/95 (BP Location: Right Arm)   Pulse 61   Temp (!) 96.8 F (36 C) (Temporal)   Resp 18   Wt 81.2 kg   SpO2 99%   BMI 30.25 kg/m  Vital signs in last 24 hours: Temp:  [96.8 F (36 C)] 96.8 F (36 C) (08/09 2229) Pulse Rate:  [61] 61 (08/09 2229) Resp:  [18] 18 (08/09 2229) BP: (197)/(95) 197/95 (08/09 2229) SpO2:  [99 %] 99 % (08/09 2229) Weight:  [81.2 kg] 81.2 kg (08/09 2218)  GENERAL: Awake, alert in NAD HEENT: - Normocephalic and atraumatic, dry mm, no LN++, no Thyromegally LUNGS - Clear to auscultation bilaterally with no wheezes CV - S1S2 RRR, no m/r/g, equal pulses bilaterally. ABDOMEN - Soft, nontender, nondistended with normoactive BS Ext: warm, well perfused, intact peripheral pulses, no edema  NEURO:  Mental Status: Somewhat drowsy but opens eyes and participates with exam, diminished attention concentration Language: speech is hypophonic but not dysarthric.  Naming, repetition, fluency, and comprehension intact.  Cranial Nerves: PERRL EOMI, visual fields full, no facial asymmetry, facial sensation intact, hearing intact, tongue/uvula/soft palate midline, normal sternocleidomastoid and trapezius muscle strength. No evidence of tongue atrophy or fibrillations Motor: All 4 extremities antigravity with symmetric strength Tone: is normal and bulk is normal Sensation- Intact to light touch bilaterally Coordination: No gross dysmetria although difficult to examine Gait- deferred NIH stroke scale-1 for level of consciousness  Labs I have reviewed labs in epic and the results pertinent to this consultation are:  CBC    Component Value Date/Time   WBC 6.6 10/07/2021 2217   RBC 4.70 10/07/2021 2217   HGB 13.9  10/07/2021 2223   HCT 41.0 10/07/2021 2223   PLT 230 10/07/2021 2217   MCV 89.1 10/07/2021 2217   MCH 29.1 10/07/2021 2217   MCHC 32.7 10/07/2021 2217   RDW 12.4 10/07/2021 2217   LYMPHSABS 2.2 10/07/2021 2217   MONOABS 0.4 10/07/2021 2217   EOSABS 0.2 10/07/2021 2217   BASOSABS 0.0 10/07/2021 2217    CMP     Component Value Date/Time   NA 140 10/07/2021 2223   K 3.6 10/07/2021 2223   CL 106 10/07/2021 2223   CO2 28 09/09/2020 0837   GLUCOSE 114 (H) 10/07/2021 2223   BUN 22 (H) 10/07/2021 2223   CREATININE 1.00 10/07/2021 2223   CALCIUM 9.4 09/09/2020 0837   PROT 6.8 09/09/2020 0837   ALBUMIN 4.2 09/09/2020 0837   AST 15 09/09/2020 0837   ALT 17 09/09/2020 0837   ALKPHOS 50 09/09/2020 0837   BILITOT 0.4 09/09/2020 0837   GFRNONAA 58 (L) 03/24/2020 1551  GFRAA >60 05/04/2017 1750    Lipid Panel     Component Value Date/Time   CHOL 209 (H) 09/09/2020 0837   TRIG 219.0 (H) 09/09/2020 0837   HDL 38.80 (L) 09/09/2020 0837   CHOLHDL 5 09/09/2020 0837   VLDL 43.8 (H) 09/09/2020 0837   LDLCALC 132 (H) 04/20/2018 0856   LDLDIRECT 139.0 09/09/2020 0837     Imaging I have reviewed the images obtained:  CT-head-aspects 10   Assessment:  61 year old with sudden onset of mental status changes as well as blurred vision-blurred vision has resolved, still somewhat drowsy, noted to be extremely hypertensive with systolic blood pressure in the high 190s in the setting of pre-existing hypertension not on treatment. I suspect that her current condition is related to her high blood pressures and not truly with a stroke but given her risk factors, I would recommend an MRI. Low NIH stroke scale precludes IV thrombolysis.  Exam not consistent with LVO, hence no emergent vessel imaging performed.  Impression: Possible hypertensive urgency  Recommendations: Stat MRI brain Check labs If no stroke on MRI, no further neurological workup.  Treat as hypertensive urgency with gradual  reduction of blood pressure by 20% each day. Plan discussed with Dr. Ronnald Nian Neurology will be available as needed.  -- Amie Portland, MD Neurologist [facility] Pager: [phone]

## 2021-10-07 NOTE — ED Provider Notes (Addendum)
[facility] EMERGENCY DEPARTMENT Provider Note   CSN: [id number] Arrival date & time: 10/07/21  2212     History  Chief Complaint  Patient presents with   Code Stroke : Blurred Vision/ALOC    LSN: 1940 10/07/21    Jaime Chapman is a 61 y.o. female.  Patient here as a code stroke.  Altered mental status with blurred vision suddenly around 3 hours ago.  Per EMS did not really have focal weakness but generalized weakness.  Was complaining of blurred vision.  Blood pressure elevated in the 190s with EMS.  She is supposed to be on lisinopril but has not picked up the prescription.  She has a history of chronic Lyme disease, bariatric surgery.  She denies any headache, dizziness, nausea, vomiting, chest pain, shortness of breath, diarrhea.  She does not endorse any alcohol or drug use.  Neurology at the bedside upon arrival.  Blood sugar is normal.  The history is provided by the patient and the EMS personnel.       Home Medications Prior to Admission medications   Medication Sig Start Date End Date Taking? Authorizing Provider  albuterol (VENTOLIN HFA) 108 (90 Base) MCG/ACT inhaler INHALE 2 PUFFS INTO THE LUNGS EVERY 4 HOURS AS NEEDED FOR [employer] 12/14/18   Parrett, Tammy S, NP  estradiol (VIVELLE-DOT) 0.1 MG/24HR patch Place 1 patch onto the skin 2 (two) times a week. 08/24/19   [provider]  levothyroxine (SYNTHROID) 25 MCG tablet Take 25 mcg by mouth daily. 12/12/19   [provider]  progesterone (PROMETRIUM) 200 MG capsule Take 200 mg by mouth daily. 08/05/20   [provider]  ursodiol (ACTIGALL) 300 MG capsule Take 300 mg by mouth 2 (two) times daily. 12/10/20   [provider]  venlafaxine (EFFEXOR) 25 MG tablet TAKE 1 TABLET BY MOUTH TWICE A DAY 08/28/21   Bedsole, [name] E, MD      Allergies    Progesterone, Codeine, and Cymbalta [duloxetine hcl]    Review of Systems   Review of Systems  Physical Exam Updated Vital  Signs BP (!) 197/95 (BP Location: Right Arm)   Pulse 61   Temp (!) 96.8 F (36 C) (Temporal)   Resp 18   Wt 81.2 kg   SpO2 99%   BMI 30.25 kg/m  Physical Exam Vitals and nursing note reviewed.  Constitutional:      General: She is not in acute distress.    Appearance: She is well-developed. She is not ill-appearing.  HENT:     Head: Normocephalic and atraumatic.     Nose: Nose normal.     Mouth/Throat:     Mouth: Mucous membranes are moist.  Eyes:     Extraocular Movements: Extraocular movements intact.     Conjunctiva/sclera: Conjunctivae normal.     Pupils: Pupils are equal, round, and reactive to light.  Cardiovascular:     Rate and Rhythm: Normal rate and regular rhythm.     Pulses: Normal pulses.     Heart sounds: Normal heart sounds. No murmur heard. Pulmonary:     Effort: Pulmonary effort is normal. No respiratory distress.     Breath sounds: Normal breath sounds.  Abdominal:     Palpations: Abdomen is soft.     Tenderness: There is no abdominal tenderness.  Musculoskeletal:        General: No swelling.     Cervical back: Normal range of motion and neck supple.  Skin:    General:  Skin is warm and dry.     Capillary Refill: Capillary refill takes less than 2 seconds.  Neurological:     Mental Status: She is alert.     Comments: Overall patient is globally weak, has normal sensation, somewhat effort dependent raising up her arms and legs, normal speech, normal visual fields, no nystagmus  Psychiatric:        Mood and Affect: Mood normal.     ED Results / Procedures / Treatments   Labs (all labs ordered are listed, but only abnormal results are displayed) Labs Reviewed  I-STAT CHEM 8, ED - Abnormal; Notable for the following components:      Result Value   BUN 22 (*)    Glucose, Bld 114 (*)    Calcium, Ion 1.11 (*)    All other components within normal limits  CBG MONITORING, ED - Abnormal; Notable for the following components:   Glucose-Capillary 115  (*)    All other components within normal limits  PROTIME-INR  APTT  CBC  DIFFERENTIAL  COMPREHENSIVE METABOLIC PANEL  ETHANOL  URINALYSIS, ROUTINE W REFLEX MICROSCOPIC  AMMONIA  RAPID URINE DRUG SCREEN, HOSP PERFORMED  I-STAT BETA HCG BLOOD, ED ([facility], [facility], [facility] ONLY)    EKG EKG Interpretation  Date/Time:  Wednesday October 07 2021 22:35:12 EDT Ventricular Rate:  61 PR Interval:  157 QRS Duration: 90 QT Interval:  424 QTC Calculation: 428 R Axis:   56 Text Interpretation: Sinus rhythm Probable anteroseptal infarct, old Confirmed by Ronnald Nian, Alamin Mccuiston [phone]) on 10/07/2021 10:38:44 PM  Radiology No results found.  Procedures Procedures    Medications Ordered in ED Medications  sodium chloride flush (NS) 0.9 % injection 3 mL (has no administration in time range)  sodium chloride 0.9 % bolus 1,000 mL (has no administration in time range)    ED Course/ Medical Decision Making/ A&P                           Medical Decision Making Amount and/or Complexity of Data Reviewed Labs: ordered. Radiology: ordered.   SHEMAIAH ROUND is here as a code stroke.  Sudden generalized weakness, blurred vision.  Elevated blood pressure with EMS in the 190s.  Dr. Malen Gauze with neurology at the bedside upon patient arrival.  Martin Majestic directly for head CT which was unremarkable per our review interpretation.  Patient not having any chest pain or shortness of breath or abdominal pain.  About 3 hours ago she started to get confused, very weak, blurred vision.  Denies any headache or dizziness or nausea or vomiting.  She does not appear to have any focal neurologic weakness on exam.  She appears just globally weak and feeling uncomfortable and lethargic.  She has no chest pain and differential diagnosis likely possibly electrolyte abnormality, functional process, PRESS, stroke, less likely infectious process.  Will give a dose of IV hydralazine to help lower blood pressure less than 301 systolic.  EKG shows sinus  rhythm with heart rate of 61.  Normal intervals.  This is per my review and interpretation.  There is no ischemic changes.  She not having any chest pain.  Doubt ACS.  Overall neurology recommends MRI to further rule out stroke and PRESS.  Patient has comorbidities of hypertension, bariatric surgery history, chronic Lyme disease.  She does not meet any criteria for tPA and no concern for large vessel occlusion.  Per my review and interpretation of labs is no significant anemia, electrolyte  abnormality.  Patient pending MRI, remaining lab work, reevaluation after IV fluids and IV hydralazine.  Please see oncoming ED staff note for further results, evaluation, disposition of the patient.   Document critical care time when appropriate:1}    Final Clinical Impression(s) / ED Diagnoses Final diagnoses:  Weakness    Rx / DC Orders ED Discharge Orders     None         Lennice Sites, DO 10/07/21 [place], [place], DO 10/07/21 2239

## 2021-10-07 NOTE — Code Documentation (Signed)
Stroke Response Nurse Documentation Code Documentation  Jaime Chapman is a 61 y.o. female arriving to [facility]  via [facility] on 8/9 with past medical hx of HTN, HLD, DM, depression. On No antithrombotic. Code stroke was activated by EMS.   Patient from home where she was LKW at [facility] and now complaining of blurred vision and mental status change.   Stroke team at the bedside on patient arrival. Labs drawn and patient cleared for CT by Dr. Ronnald Nian. Patient to CT with team. NIHSS 1, see documentation for details and code stroke times. Patient with decreased LOC on exam. The following imaging was completed:  CT Head. Patient is not a candidate for IV Thrombolytic due to symptoms not consistent with stroke. Patient is not a candidate for IR due to No LVO symptoms.    Bedside handoff with ED RN Mortimer Fries.    Madelynn Done  Rapid Response RN

## 2021-10-08 ENCOUNTER — Encounter (HOSPITAL_COMMUNITY): Payer: Self-pay | Admitting: Internal Medicine

## 2021-10-08 DIAGNOSIS — R29818 Other symptoms and signs involving the nervous system: Secondary | ICD-10-CM | POA: Diagnosis not present

## 2021-10-08 DIAGNOSIS — E039 Hypothyroidism, unspecified: Secondary | ICD-10-CM

## 2021-10-08 DIAGNOSIS — I160 Hypertensive urgency: Secondary | ICD-10-CM | POA: Diagnosis present

## 2021-10-08 DIAGNOSIS — I629 Nontraumatic intracranial hemorrhage, unspecified: Secondary | ICD-10-CM | POA: Diagnosis not present

## 2021-10-08 LAB — BASIC METABOLIC PANEL
Anion gap: 7 (ref 5–15)
BUN: 21 mg/dL — ABNORMAL HIGH (ref 6–20)
CO2: 26 mmol/L (ref 22–32)
Calcium: 9.1 mg/dL (ref 8.9–10.3)
Chloride: 108 mmol/L (ref 98–111)
Creatinine, Ser: 0.96 mg/dL (ref 0.44–1.00)
GFR, Estimated: >60 mL/min (ref >60)
Glucose, Bld: 161 mg/dL — ABNORMAL HIGH (ref 70–99)
Potassium: 3.3 mmol/L — ABNORMAL LOW (ref 3.5–5.1)
Sodium: 141 mmol/L (ref 135–145)

## 2021-10-08 LAB — HEPATIC FUNCTION PANEL
ALT: 14 U/L (ref 0–44)
AST: 12 U/L — ABNORMAL LOW (ref 15–41)
Albumin: 3.5 g/dL (ref 3.5–5.0)
Alkaline Phosphatase: 42 U/L (ref 38–126)
Bilirubin, Direct: <0.1 mg/dL (ref 0.0–0.2)
Total Bilirubin: 0.6 mg/dL (ref 0.3–1.2)
Total Protein: 6.1 g/dL — ABNORMAL LOW (ref 6.5–8.1)

## 2021-10-08 LAB — CBC WITH DIFFERENTIAL/PLATELET
Abs Immature Granulocytes: 0.05 10*3/uL (ref 0.00–0.07)
Basophils Absolute: 0 10*3/uL (ref 0.0–0.1)
Basophils Relative: 1 %
Eosinophils Absolute: 0.1 10*3/uL (ref 0.0–0.5)
Eosinophils Relative: 2 %
HCT: 39.8 % (ref 36.0–46.0)
Hemoglobin: 13.4 g/dL (ref 12.0–15.0)
Immature Granulocytes: 1 %
Lymphocytes Relative: 26 %
Lymphs Abs: 2.2 10*3/uL (ref 0.7–4.0)
MCH: 29.7 pg (ref 26.0–34.0)
MCHC: 33.7 g/dL (ref 30.0–36.0)
MCV: 88.2 fL (ref 80.0–100.0)
Monocytes Absolute: 0.5 10*3/uL (ref 0.1–1.0)
Monocytes Relative: 6 %
Neutro Abs: 5.7 10*3/uL (ref 1.7–7.7)
Neutrophils Relative %: 64 %
Platelets: 224 10*3/uL (ref 150–400)
RBC: 4.51 MIL/uL (ref 3.87–5.11)
RDW: 12.7 % (ref 11.5–15.5)
WBC: 8.6 10*3/uL (ref 4.0–10.5)
nRBC: 0 % (ref 0.0–0.2)

## 2021-10-08 LAB — URINALYSIS, ROUTINE W REFLEX MICROSCOPIC
Bilirubin Urine: NEGATIVE
Glucose, UA: NEGATIVE mg/dL
Hgb urine dipstick: NEGATIVE
Ketones, ur: NEGATIVE mg/dL
Leukocytes,Ua: NEGATIVE
Nitrite: NEGATIVE
Protein, ur: NEGATIVE mg/dL
Specific Gravity, Urine: 1.008 (ref 1.005–1.030)
pH: 6 (ref 5.0–8.0)

## 2021-10-08 LAB — RAPID URINE DRUG SCREEN, HOSP PERFORMED
Amphetamines: NOT DETECTED
Barbiturates: NOT DETECTED
Benzodiazepines: NOT DETECTED
Cocaine: NOT DETECTED
Opiates: NOT DETECTED
Tetrahydrocannabinol: NOT DETECTED

## 2021-10-08 LAB — HIV ANTIBODY (ROUTINE TESTING W REFLEX): HIV Screen 4th Generation wRfx: NONREACTIVE

## 2021-10-08 MED ORDER — ENOXAPARIN SODIUM 40 MG/0.4ML IJ SOSY
40.0000 mg | PREFILLED_SYRINGE | Freq: Every day | INTRAMUSCULAR | Status: DC
Start: 2021-10-08 — End: 2021-10-08

## 2021-10-08 MED ORDER — HYDROXYZINE HCL 25 MG PO TABS: 25.0000 mg | ORAL_TABLET | Freq: Four times a day (QID) | ORAL | Status: DC | PRN

## 2021-10-08 MED ORDER — HYDRALAZINE HCL 20 MG/ML IJ SOLN: 5.0000 mg | INTRAMUSCULAR | Status: DC | PRN

## 2021-10-08 MED ORDER — PROCHLORPERAZINE EDISYLATE 10 MG/2ML IJ SOLN
5.0000 mg | Freq: Once | INTRAMUSCULAR | Status: AC
  Administered 2021-10-08: 5 mg via INTRAVENOUS
  Filled 2021-10-08: qty 2

## 2021-10-08 MED ORDER — LOSARTAN POTASSIUM 25 MG PO TABS: 25.0000 mg | ORAL_TABLET | Freq: Every day | ORAL | 0 refills | Status: DC

## 2021-10-08 MED ORDER — POTASSIUM CHLORIDE CRYS ER 20 MEQ PO TBCR
40.0000 meq | EXTENDED_RELEASE_TABLET | Freq: Once | ORAL | Status: AC
  Administered 2021-10-08: 40 meq via ORAL
  Filled 2021-10-08: qty 2

## 2021-10-08 MED ORDER — ACETAMINOPHEN 325 MG PO TABS: 650.0000 mg | ORAL_TABLET | Freq: Four times a day (QID) | ORAL | Status: DC | PRN

## 2021-10-08 MED ORDER — ACETAMINOPHEN 500 MG PO TABS
1000.0000 mg | ORAL_TABLET | Freq: Once | ORAL | Status: AC
  Administered 2021-10-08: 1000 mg via ORAL
  Filled 2021-10-08: qty 2

## 2021-10-08 MED ORDER — NITROGLYCERIN 2 % TD OINT
1.0000 [in_us] | TOPICAL_OINTMENT | Freq: Once | TRANSDERMAL | Status: AC
  Administered 2021-10-08: 1 [in_us] via TOPICAL
  Filled 2021-10-08: qty 1

## 2021-10-08 MED ORDER — ACETAMINOPHEN 650 MG RE SUPP: 650.0000 mg | Freq: Four times a day (QID) | RECTAL | Status: DC | PRN

## 2021-10-08 MED ORDER — LOSARTAN POTASSIUM 50 MG PO TABS
25.0000 mg | ORAL_TABLET | Freq: Every day | ORAL | Status: DC
  Administered 2021-10-08: 25 mg via ORAL
  Filled 2021-10-08: qty 1

## 2021-10-08 NOTE — ED Notes (Signed)
500 cc output. Urine sample sent to lab. Linen changed from purwick leak.

## 2021-10-08 NOTE — Progress Notes (Signed)
MRI negative - no further inpatient neurological w/u Please call inpatient neurology with questions as needed.   -- Amie Portland, MD Neurologist [facility] Pager: [phone]

## 2021-10-08 NOTE — ED Notes (Signed)
ED TO INPATIENT HANDOFF REPORT  S Name/Age/Gender Jaime Chapman 61 y.o. female Room/Bed: TRACC/TRACC  Code Status : Full Code   Alert and oriented x4 , came from home   Triage Complete: Triage complete  Chief Complaint : Blurred Vision / ALOC ( Code Stroke)    Triage Note Patient arrived with EMS from home , LSN 1940 this evening , reports sudden onset blurred vision this evening with lethargy/brief confusion , symptoms improved at arrival . Evaluated by EDP/Neurologist at arrival and transported to CT scan .    Allergies Allergies  Allergen Reactions   Progesterone     Stroke-like symptoms   Codeine     REACTION: nausea,vomiting,dizziness   Cymbalta [Duloxetine Hcl]     Pt states it caused weight gain    Level of Care/Admitting Diagnosis ED Disposition     None       B Medical/Surgery History Past Medical History:  Diagnosis Date   Allergic rhinitis    Anxiety    Asthma    DDD (degenerative disc disease)    also has spinal arthritis   Depression    Diabetes mellitus    borderline   Dyslipidemia    Fibromyalgia    GERD (gastroesophageal reflux disease)    Hypertension    Lung nodules    RIGHT LUNG--LAST EVALUATED BY CHEST CT 02/01/12 - STABLE AND FOLLOW UP PLANNED IN ONE YEAR.   Lyme disease    Obesity    OSA (obstructive sleep apnea) 05/10/2017   Sinusitis    Stress disorder, acute    Vitamin D deficiency    Past Surgical History:  Procedure Laterality Date   CESAREAN SECTION  1982, 1985   x 2    ENDOMETRIAL ABLATION  2009   HERNIA REPAIR  04/14/12   ventral hernia repair   Laparoscopic ventral hernia repair w/ mesh  08/2009   Dr. Grandville Silos, [facility]   LAPAROSCOPY  2002   TUBAL LIGATION     VENTRAL HERNIA REPAIR  09/2006   Dr. Grandville Silos   VENTRAL HERNIA REPAIR N/A 04/14/2012   Procedure: Laparoscopic Repair of  Ventral Hernia;  Surgeon: Pedro Earls, MD;  Location: [facility];  Service: General;  Laterality: N/A;  Repair of Recurrent Ventral  Hernia     A IV Location/Drains/Wounds Patient Lines/Drains/Airways Status     Active Line/Drains/Airways     Name Placement date Placement time Site Days   Peripheral IV 10/07/21 20 G Left Antecubital 10/07/21  --  Antecubital  1   Incision 04/14/12 Abdomen 04/14/12  0931  -- 3464   Incision - 3 Ports Abdomen 1: Left;Superior 2: Left;Lower 3: Right;Lower 04/14/12  0749  -- 3464            Intake/Output Last 24 hours  Intake/Output Summary (Last 24 hours) at 10/08/2021 0216 Last data filed at 10/07/2021 2324 Gross per 24 hour  Intake 1000 ml  Output --  Net 1000 ml    Labs/Imaging Results for orders placed or performed during the hospital encounter of 10/07/21 (from the past 48 hour(s))  Ethanol     Status: None   Collection Time: 10/07/21 10:14 PM  Result Value Ref Range   Alcohol, Ethyl (B) <10 <10 mg/dL    Comment: (NOTE) Lowest detectable limit for serum alcohol is 10 mg/dL.  For medical purposes only. Performed at [facility], [place] [address], [place], [place] [address]   CBG monitoring, ED     Status: Abnormal  Collection Time: 10/07/21 10:15 PM  Result Value Ref Range   Glucose-Capillary 115 (H) 70 - 99 mg/dL    Comment: Glucose reference range applies only to samples taken after fasting for at least 8 hours.  Protime-INR     Status: None   Collection Time: 10/07/21 10:17 PM  Result Value Ref Range   Prothrombin Time 12.6 11.4 - 15.2 seconds   INR 1.0 0.8 - 1.2    Comment: (NOTE) INR goal varies based on device and disease states. Performed at [facility], [place] [address] [place], [place] [address]   APTT     Status: None   Collection Time: 10/07/21 10:17 PM  Result Value Ref Range   aPTT 29 24 - 36 seconds    Comment: Performed at [facility] [address] [place], [place] [address]  CBC     Status: None   Collection Time: 10/07/21 10:17 PM  Result Value Ref Range   WBC 6.6 4.0 - 10.5 K/uL   RBC 4.70 3.87 - 5.11 MIL/uL    Hemoglobin 13.7 12.0 - 15.0 g/dL   HCT 41.9 36.0 - 46.0 %   MCV 89.1 80.0 - 100.0 fL   MCH 29.1 26.0 - 34.0 pg   MCHC 32.7 30.0 - 36.0 g/dL   RDW 12.4 11.5 - 15.5 %   Platelets 230 150 - 400 K/uL   nRBC 0.0 0.0 - 0.2 %    Comment: Performed at [facility], [place] [address], [place], [place] [address]  Differential     Status: None   Collection Time: 10/07/21 10:17 PM  Result Value Ref Range   Neutrophils Relative % 57 %   Neutro Abs 3.8 1.7 - 7.7 K/uL   Lymphocytes Relative 33 %   Lymphs Abs 2.2 0.7 - 4.0 K/uL   Monocytes Relative 7 %   Monocytes Absolute 0.4 0.1 - 1.0 K/uL   Eosinophils Relative 2 %   Eosinophils Absolute 0.2 0.0 - 0.5 K/uL   Basophils Relative 1 %   Basophils Absolute 0.0 0.0 - 0.1 K/uL   Immature Granulocytes 0 %   Abs Immature Granulocytes 0.02 0.00 - 0.07 K/uL    Comment: Performed at [facility] [address], [place], [place] [address]  Comprehensive metabolic panel     Status: Abnormal   Collection Time: 10/07/21 10:17 PM  Result Value Ref Range   Sodium 140 135 - 145 mmol/L   Potassium 3.8 3.5 - 5.1 mmol/L   Chloride 107 98 - 111 mmol/L   CO2 25 22 - 32 mmol/L   Glucose, Bld 117 (H) 70 - 99 mg/dL    Comment: Glucose reference range applies only to samples taken after fasting for at least 8 hours.   BUN 20 6 - 20 mg/dL   Creatinine, Ser 0.93 0.44 - 1.00 mg/dL   Calcium 9.5 8.9 - 10.3 mg/dL   Total Protein 6.2 (L) 6.5 - 8.1 g/dL   Albumin 4.0 3.5 - 5.0 g/dL   AST 19 15 - 41 U/L   ALT 13 0 - 44 U/L   Alkaline Phosphatase 51 38 - 126 U/L   Total Bilirubin 0.6 0.3 - 1.2 mg/dL   GFR, Estimated >60 >60 mL/min    Comment: (NOTE) Calculated using the CKD-EPI Creatinine Equation (2021)    Anion gap 8 5 - 15    Comment: Performed at [facility] [address], [place], [place] [address]  Ammonia     Status: None   Collection Time: 10/07/21 10:17 PM  Result Value Ref Range   Ammonia 33 9 - 35 umol/L    Comment: HEMOLYSIS AT THIS  LEVEL MAY AFFECT RESULT Performed at [facility], [place] [address], [place], [place] [address]   I-Stat beta hCG blood, ED     Status: None   Collection Time: 10/07/21 10:21 PM  Result Value Ref Range   I-stat hCG, quantitative <5.0 <5 mIU/mL   Comment 3            Comment:   GEST. AGE      CONC.  (mIU/mL)   <=1 WEEK        5 - 50     2 WEEKS       50 - 500     3 WEEKS       100 - 10,000     4 WEEKS     1,000 - 30,000        FEMALE AND NON-PREGNANT FEMALE:     LESS THAN 5 mIU/mL   I-stat chem 8, ED     Status: Abnormal   Collection Time: 10/07/21 10:23 PM  Result Value Ref Range   Sodium 140 135 - 145 mmol/L   Potassium 3.6 3.5 - 5.1 mmol/L   Chloride 106 98 - 111 mmol/L   BUN 22 (H) 6 - 20 mg/dL   Creatinine, Ser 1.00 0.44 - 1.00 mg/dL   Glucose, Bld 114 (H) 70 - 99 mg/dL    Comment: Glucose reference range applies only to samples taken after fasting for at least 8 hours.   Calcium, Ion 1.11 (L) 1.15 - 1.40 mmol/L   TCO2 24 22 - 32 mmol/L   Hemoglobin 13.9 12.0 - 15.0 g/dL   HCT 41.0 36.0 - 46.0 %  Urinalysis, Routine w reflex microscopic Urine, Clean Catch     Status: Abnormal   Collection Time: 10/07/21 10:31 PM  Result Value Ref Range   Color, Urine STRAW (A) YELLOW   APPearance CLEAR CLEAR   Specific Gravity, Urine 1.008 1.005 - 1.030   pH 6.0 5.0 - 8.0   Glucose, UA NEGATIVE NEGATIVE mg/dL   Hgb urine dipstick NEGATIVE NEGATIVE   Bilirubin Urine NEGATIVE NEGATIVE   Ketones, ur NEGATIVE NEGATIVE mg/dL   Protein, ur NEGATIVE NEGATIVE mg/dL   Nitrite NEGATIVE NEGATIVE   Leukocytes,Ua NEGATIVE NEGATIVE    Comment: Performed at [facility], [place] [address] [place], [place] [address]  Rapid urine drug screen (hospital performed)     Status: None   Collection Time: 10/07/21 10:31 PM  Result Value Ref Range   Opiates NONE DETECTED NONE DETECTED   Cocaine NONE DETECTED NONE DETECTED   Benzodiazepines NONE DETECTED NONE DETECTED   Amphetamines NONE DETECTED  NONE DETECTED   Tetrahydrocannabinol NONE DETECTED NONE DETECTED   Barbiturates NONE DETECTED NONE DETECTED    Comment: (NOTE) DRUG SCREEN FOR MEDICAL PURPOSES ONLY.  IF CONFIRMATION IS NEEDED FOR ANY PURPOSE, NOTIFY LAB WITHIN 5 DAYS.  LOWEST DETECTABLE LIMITS FOR URINE DRUG SCREEN Drug Class                     Cutoff (ng/mL) Amphetamine and metabolites    1000 Barbiturate and metabolites    200 Benzodiazepine                 323 Tricyclics and metabolites     300 Opiates and  metabolites        300 Cocaine and metabolites        300 THC                            50 Performed at [facility], [place] [address], [place], [place] [address]    MR BRAIN WO CONTRAST  Result Date: 10/08/2021 CLINICAL DATA:  Initial evaluation for neuro deficit, stroke suspected. EXAM: MRI HEAD WITHOUT CONTRAST TECHNIQUE: Multiplanar, multiecho pulse sequences of the brain and surrounding structures were obtained without intravenous contrast. COMPARISON:  CT from 10/07/2021. FINDINGS: Brain: Cerebral volume within normal limits for patient age. No focal parenchymal signal abnormality identified. No abnormal foci of restricted diffusion to suggest acute or subacute ischemia. Gray-white matter differentiation well maintained. No encephalomalacia to suggest chronic infarction. No foci of susceptibility artifact to suggest acute or chronic intracranial hemorrhage. No mass lesion, midline shift or mass effect. No hydrocephalus. No extra-axial fluid collection. Pituitary gland and suprasellar region are normal. Midline structures intact and normal. Vascular: Major intracranial vascular flow voids are well maintained. Skull and upper cervical spine: Craniocervical junction within normal limits. Bone marrow signal intensity normal. No scalp soft tissue abnormality. Sinuses/Orbits: Globes and orbital soft tissues within normal limits. Paranasal sinuses are largely clear.  No mastoid effusion. Other: None. IMPRESSION:  Normal brain MRI. No acute intracranial abnormality identified. Electronically Signed   By: Jeannine Boga M.D.   On: 10/08/2021 01:03   DG Chest Port 1 View  Result Date: 10/07/2021 CLINICAL DATA:  Altered level of consciousness, blurred vision EXAM: PORTABLE CHEST 1 VIEW COMPARISON:  03/24/2020 FINDINGS: Single frontal view of the chest demonstrates a stable cardiac silhouette. Prominent epicardial fat pad obscures the right heart border. No airspace disease, effusion, or pneumothorax. No acute bony abnormality. IMPRESSION: 1. No acute intrathoracic process. Electronically Signed   By: Randa Ngo M.D.   On: 10/07/2021 22:52   CT HEAD CODE STROKE WO CONTRAST  Result Date: 10/07/2021 CLINICAL DATA:  Code stroke. Initial evaluation for neuro deficit, stroke suspected. EXAM: CT HEAD WITHOUT CONTRAST TECHNIQUE: Contiguous axial images were obtained from the base of the skull through the vertex without intravenous contrast. RADIATION DOSE REDUCTION: This exam was performed according to the departmental dose-optimization program which includes automated exposure control, adjustment of the mA and/or kV according to patient size and/or use of iterative reconstruction technique. COMPARISON:  Prior study from 03/08/2019. FINDINGS: Brain: Cerebral volume within normal limits. No acute intracranial hemorrhage. No acute large vessel territory infarct. No mass lesion, mass effect or midline shift. No hydrocephalus or extra-axial fluid collection. Vascular: No hyperdense vessel. Skull: Scalp soft tissues and calvarium within normal limits. Sinuses/Orbits: Globes orbital soft tissues demonstrate no acute finding. Small right maxillary sinus retention cyst. Mucoperiosteal thickening present within the ethmoidal air cells. No mastoid effusion. Other: None. ASPECTS [facility] Stroke Program Early CT Score) - Ganglionic level infarction (caudate, lentiform nuclei, internal capsule, insula, M1-M3 cortex): 7 -  Supraganglionic infarction (M4-M6 cortex): 30 Total score (0-10 with 10 being normal): 10 IMPRESSION: 1. Negative head CT with no acute intracranial abnormality identified. 2. ASPECTS is 10. These results were communicated to Dr. Rory Percy at 10:35 pm on 10/07/2021 by text page via the [facility] messaging system. Electronically Signed   By: Jeannine Boga M.D.   On: 10/07/2021 22:37    Pending Labs Unresulted Labs (From admission, onward)    None  Vitals/Pain Today's Vitals   10/08/21 0145 10/08/21 0200 10/08/21 0203 10/08/21 0211  BP: (!) 172/110 (!) 150/88    Pulse: 73 69    Resp: 17 13    Temp:    98 F (36.7 C)  TempSrc:    Oral  SpO2: 100% 99%    Weight:      PainSc:   0-No pain     Isolation Precautions : No Isolation    Medications Medications  sodium chloride flush (NS) 0.9 % injection 3 mL (3 mLs Intravenous Given 10/07/21 2248)  sodium chloride 0.9 % bolus 1,000 mL (0 mLs Intravenous Stopped 10/07/21 2324)  hydrALAZINE (APRESOLINE) injection 10 mg (10 mg Intravenous Given 10/07/21 2246)  acetaminophen (TYLENOL) tablet 1,000 mg (1,000 mg Oral Given 10/08/21 0156)  nitroGLYCERIN (NITROGLYN) 2 % ointment 1 inch (1 inch Topical Given 10/08/21 0157)    Mobility : Ambulatory    R Recommendations: See Admitting Provider Note

## 2021-10-08 NOTE — ED Notes (Signed)
Patient is at MRI

## 2021-10-08 NOTE — Discharge Instructions (Signed)
Follow with Primary MD Jinny Sanders, MD in 7 days   Get CBC, CMP, checked  by Primary MD next visit.    Activity: As tolerated with Full fall precautions use walker/cane & assistance as needed   Disposition Home    Diet: Heart Healthy   On your next visit with your primary care physician please Get Medicines reviewed and adjusted.   Please request your Prim.MD to go over all Hospital Tests and Procedure/Radiological results at the follow up, please get all Hospital records sent to your Prim MD by signing hospital release before you go home.   If you experience worsening of your admission symptoms, develop shortness of breath, life threatening emergency, suicidal or homicidal thoughts you must seek medical attention immediately by calling 911 or calling your MD immediately  if symptoms less severe.  You Must read complete instructions/literature along with all the possible adverse reactions/side effects for all the Medicines you take and that have been prescribed to you. Take any new Medicines after you have completely understood and accpet all the possible adverse reactions/side effects.   Do not drive, operating heavy machinery, perform activities at heights, swimming or participation in water activities or provide baby sitting services if your were admitted for syncope or siezures until you have seen by Primary MD or a Neurologist and advised to do so again.  Do not drive when taking Pain medications.    Do not take more than prescribed Pain, Sleep and Anxiety Medications  Special Instructions: If you have smoked or chewed Tobacco  in the last 2 yrs please stop smoking, stop any regular Alcohol  and or any Recreational drug use.  Wear Seat belts while driving.   Please note  You were cared for by a hospitalist during your hospital stay. If you have any questions about your discharge medications or the care you received while you were in the hospital after you are discharged,  you can call the unit and asked to speak with the hospitalist on call if the hospitalist that took care of you is not available. Once you are discharged, your primary care physician will handle any further medical issues. Please note that NO REFILLS for any discharge medications will be authorized once you are discharged, as it is imperative that you return to your primary care physician (or establish a relationship with a primary care physician if you do not have one) for your aftercare needs so that they can reassess your need for medications and monitor your lab values.

## 2021-10-08 NOTE — ED Provider Notes (Signed)
I assumed care of this patient.  Please see previous provider note for further details of Hx, PE.  Briefly patient is a 61 y.o. female who presented as a code stroke for weakness and blurry vision. Possible HTN urgency. Pending MRI.  MRI negative. Neuro cleared.  Patient still hypertensive to 180s. NTG paste applied. Admitted to medicine for further management.        Fatima Blank, MD 10/08/21 [phone]

## 2021-10-08 NOTE — ED Notes (Signed)
Patient back from MRI and hooked back up. Patient said she got up in MRI to urinate, so purwick was removed in MRI and she did not want a replacement. Patient said she has a slight headache. RN Mortimer Fries notified.

## 2021-10-08 NOTE — Discharge Summary (Addendum)
Physician Discharge Summary  Jaime Chapman HGD:[id number] DOB: 06-29-1960 DOA: 10/07/2021  PCP: Jinny Sanders, MD  Admit date: 10/07/2021 Discharge date: 10/08/2021  Admitted From: Home Disposition:  Home   Recommendations for Outpatient Follow-up:  Follow up with PCP in 1-2 weeks Please obtain BMP/CBC in one week Please continue to monitor and adjust antihypertensive regimen as needed, and continue to educate patient about compliance.    Discharge Condition:Stable CODE STATUS:FULL Diet recommendation: Heart Healthy   Brief/Interim Summary:  Jaime Chapman is a 61 y.o. female with history of fibromyalgia, hypertension, sleep apnea, chronic Lyme was brought to the ER after patient started having sudden onset of blurred vision confusion last night at home.  EMS was called and patient was found to be confused with markedly elevated blood pressure with systolic in the 962I.  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  Over the last 3 weeks her primary care physician noticed that her blood pressure has been running high and was prescribed losartan which she has not picked up yet.  Denies any chest pain shortness of breath headache did have some nausea and generalized weakness. - Patient also states that recently she has a lot of stress at home.  -In the ER patient was evaluated by neurologist and patient's symptoms were not consistent with stroke.  CT head and MRI were unremarkable.  At this time neurologist feel her symptoms are likely from hypertensive urgency.  Hypertensive urgency -She was given losartan as an outpatient, but she did not start taking it, was started on nitro paste, and losartan overnight, blood pressure well-controlled this morning, actually was low over Nitropaste has been discontinued, blood pressure remains acceptable on losartan, so she will be discharged Sartain 25 mg oral daily (reports she still has the prescription sent by  her PCP which she did not pick up yet ).  Hypothyroidism -Continue with home medications  Discharge Diagnoses:  Principal Problem:   Hypertensive urgency Active Problems:   Hypothyroidism    Discharge Instructions  Discharge Instructions     Discharge instructions   Complete by: As directed    Follow with Primary MD Jinny Sanders, MD in 7 days   Get CBC, CMP, checked  by Primary MD next visit.    Activity: As tolerated with Full fall precautions use walker/cane & assistance as needed   Disposition Home    Diet: Heart Healthy   On your next visit with your primary care physician please Get Medicines reviewed and adjusted.   Please request your Prim.MD to go over all Hospital Tests and Procedure/Radiological results at the follow up, please get all Hospital records sent to your Prim MD by signing hospital release before you go home.   If you experience worsening of your admission symptoms, develop shortness of breath, life threatening emergency, suicidal or homicidal thoughts you must seek medical attention immediately by calling 911 or calling your MD immediately  if symptoms less severe.  You Must read complete instructions/literature along with all the possible adverse reactions/side effects for all the Medicines you take and that have been prescribed to you. Take any new Medicines after you have completely understood and accpet all the possible adverse reactions/side effects.   Do not drive, operating heavy machinery, perform activities at heights, swimming or participation in water activities or provide baby sitting services if your were admitted for syncope or siezures until you have seen by Primary MD or a Neurologist and advised  to do so again.  Do not drive when taking Pain medications.    Do not take more than prescribed Pain, Sleep and Anxiety Medications  Special Instructions: If you have smoked or chewed Tobacco  in the last 2 yrs please stop smoking,  stop any regular Alcohol  and or any Recreational drug use.  Wear Seat belts while driving.   Please note  You were cared for by a hospitalist during your hospital stay. If you have any questions about your discharge medications or the care you received while you were in the hospital after you are discharged, you can call the unit and asked to speak with the hospitalist on call if the hospitalist that took care of you is not available. Once you are discharged, your primary care physician will handle any further medical issues. Please note that NO REFILLS for any discharge medications will be authorized once you are discharged, as it is imperative that you return to your primary care physician (or establish a relationship with a primary care physician if you do not have one) for your aftercare needs so that they can reassess your need for medications and monitor your lab values.   Increase activity slowly   Complete by: As directed       Allergies as of 10/08/2021       Reactions   Progesterone    Stroke-like symptoms   Codeine    REACTION: nausea,vomiting,dizziness   Cymbalta [duloxetine Hcl]    Pt states it caused weight gain        Medication List     TAKE these medications    albuterol 108 (90 Base) MCG/ACT inhaler Commonly known as: Ventolin HFA INHALE 2 PUFFS INTO THE LUNGS EVERY 4 HOURS AS NEEDED FOR WHEEIZNG   estradiol 0.1 MG/24HR patch Commonly known as: VIVELLE-DOT Place 1 patch onto the skin 2 (two) times a week.   levothyroxine 25 MCG tablet Commonly known as: SYNTHROID Take 25 mcg by mouth daily.   losartan 25 MG tablet Commonly known as: COZAAR Take 1 tablet (25 mg total) by mouth daily. Start taking on: October 09, 2021   progesterone 200 MG capsule Commonly known as: PROMETRIUM Take 200 mg by mouth daily.   ursodiol 300 MG capsule Commonly known as: ACTIGALL Take 300 mg by mouth 2 (two) times daily.   venlafaxine 25 MG tablet Commonly known as:  EFFEXOR TAKE 1 TABLET BY MOUTH TWICE A DAY        Follow-up Information     Jinny Sanders, MD Follow up.   Specialty: Family Medicine Contact information: [place] [address] [phone]         Jerline Pain, MD .   Specialty: Cardiology Contact information: [id number] [place] [address] [phone]                Allergies  Allergen Reactions   Progesterone     Stroke-like symptoms   Codeine     REACTION: nausea,vomiting,dizziness   Cymbalta [Duloxetine Hcl]     Pt states it caused weight gain    Consultations: None   Procedures/Studies: MR BRAIN WO CONTRAST  Result Date: 10/08/2021 CLINICAL DATA:  Initial evaluation for neuro deficit, stroke suspected. EXAM: MRI HEAD WITHOUT CONTRAST TECHNIQUE: Multiplanar, multiecho pulse sequences of the brain and surrounding structures were obtained without intravenous contrast. COMPARISON:  CT from 10/07/2021. FINDINGS: Brain: Cerebral volume within normal limits for patient age. No focal  parenchymal signal abnormality identified. No abnormal foci of restricted diffusion to suggest acute or subacute ischemia. Gray-white matter differentiation well maintained. No encephalomalacia to suggest chronic infarction. No foci of susceptibility artifact to suggest acute or chronic intracranial hemorrhage. No mass lesion, midline shift or mass effect. No hydrocephalus. No extra-axial fluid collection. Pituitary gland and suprasellar region are normal. Midline structures intact and normal. Vascular: Major intracranial vascular flow voids are well maintained. Skull and upper cervical spine: Craniocervical junction within normal limits. Bone marrow signal intensity normal. No scalp soft tissue abnormality. Sinuses/Orbits: Globes and orbital soft tissues within normal limits. Paranasal sinuses are largely clear.  No mastoid effusion. Other: None. IMPRESSION: Normal brain MRI. No acute  intracranial abnormality identified. Electronically Signed   By: Jeannine Boga M.D.   On: 10/08/2021 01:03   DG Chest Port 1 View  Result Date: 10/07/2021 CLINICAL DATA:  Altered level of consciousness, blurred vision EXAM: PORTABLE CHEST 1 VIEW COMPARISON:  03/24/2020 FINDINGS: Single frontal view of the chest demonstrates a stable cardiac silhouette. Prominent epicardial fat pad obscures the right heart border. No airspace disease, effusion, or pneumothorax. No acute bony abnormality. IMPRESSION: 1. No acute intrathoracic process. Electronically Signed   By: Randa Ngo M.D.   On: 10/07/2021 22:52   CT HEAD CODE STROKE WO CONTRAST  Result Date: 10/07/2021 CLINICAL DATA:  Code stroke. Initial evaluation for neuro deficit, stroke suspected. EXAM: CT HEAD WITHOUT CONTRAST TECHNIQUE: Contiguous axial images were obtained from the base of the skull through the vertex without intravenous contrast. RADIATION DOSE REDUCTION: This exam was performed according to the departmental dose-optimization program which includes automated exposure control, adjustment of the mA and/or kV according to patient size and/or use of iterative reconstruction technique. COMPARISON:  Prior study from 03/08/2019. FINDINGS: Brain: Cerebral volume within normal limits. No acute intracranial hemorrhage. No acute large vessel territory infarct. No mass lesion, mass effect or midline shift. No hydrocephalus or extra-axial fluid collection. Vascular: No hyperdense vessel. Skull: Scalp soft tissues and calvarium within normal limits. Sinuses/Orbits: Globes orbital soft tissues demonstrate no acute finding. Small right maxillary sinus retention cyst. Mucoperiosteal thickening present within the ethmoidal air cells. No mastoid effusion. Other: None. ASPECTS [facility] Stroke Program Early CT Score) - Ganglionic level infarction (caudate, lentiform nuclei, internal capsule, insula, M1-M3 cortex): 7 - Supraganglionic infarction (M4-M6  cortex): 30 Total score (0-10 with 10 being normal): 10 IMPRESSION: 1. Negative head CT with no acute intracranial abnormality identified. 2. ASPECTS is 10. These results were communicated to Dr. Rory Percy at 10:35 pm on 10/07/2021 by text page via the [facility] messaging system. Electronically Signed   By: Jeannine Boga M.D.   On: 10/07/2021 22:37      Subjective:  Reports she is feeling better, no focal deficits, no blurry vision, she just reports generalized fatigue. Discharge Exam: Vitals:   10/08/21 0815 10/08/21 0914  BP: 126/72 118/66  Pulse: 76 73  Resp: 20 13  Temp:  97.8 F (36.6 C)  SpO2: 99% 98%   Vitals:   10/08/21 0730 10/08/21 0800 10/08/21 0815 10/08/21 0914  BP: 109/63 111/65 126/72 118/66  Pulse: 65 78 76 73  Resp: '15 17 20 13  '$ Temp:    97.8 F (36.6 C)  TempSrc:      SpO2: 98% 99% 99% 98%  Weight:        General: Pt is alert, awake, not in acute distress Cardiovascular: RRR, S1/S2 +, no rubs, no gallops Respiratory: CTA bilaterally, no wheezing, no rhonchi  Abdominal: Soft, NT, ND, bowel sounds + Extremities: no edema, no cyanosis    The results of significant diagnostics from this hospitalization (including imaging, microbiology, ancillary and laboratory) are listed below for reference.     Microbiology: No results found for this or any previous visit (from the past 240 hour(s)).   Labs: BNP (last 3 results) No results for input(s): "BNP" in the last 8760 hours. Basic Metabolic Panel: Recent Labs  Lab 10/07/21 2217 10/07/21 2223 10/08/21 0417  NA 140 140 141  K 3.8 3.6 3.3*  CL 107 106 108  CO2 25  --  26  GLUCOSE 117* 114* 161*  BUN 20 22* 21*  CREATININE 0.93 1.00 0.96  CALCIUM 9.5  --  9.1   Liver Function Tests: Recent Labs  Lab 10/07/21 2217 10/08/21 0417  AST 19 12*  ALT 13 14  ALKPHOS 51 42  BILITOT 0.6 0.6  PROT 6.2* 6.1*  ALBUMIN 4.0 3.5   No results for input(s): "LIPASE", "AMYLASE" in the last 168 hours. Recent Labs   Lab 10/07/21 2217  AMMONIA 33   CBC: Recent Labs  Lab 10/07/21 2217 10/07/21 2223 10/08/21 0417  WBC 6.6  --  8.6  NEUTROABS 3.8  --  5.7  HGB 13.7 13.9 13.4  HCT 41.9 41.0 39.8  MCV 89.1  --  88.2  PLT 230  --  224   Cardiac Enzymes: No results for input(s): "CKTOTAL", "CKMB", "CKMBINDEX", "TROPONINI" in the last 168 hours. BNP: Invalid input(s): "POCBNP" CBG: Recent Labs  Lab 10/07/21 2215  GLUCAP 115*   D-Dimer No results for input(s): "DDIMER" in the last 72 hours. Hgb A1c No results for input(s): "HGBA1C" in the last 72 hours. Lipid Profile No results for input(s): "CHOL", "HDL", "LDLCALC", "TRIG", "CHOLHDL", "LDLDIRECT" in the last 72 hours. Thyroid function studies No results for input(s): "TSH", "T4TOTAL", "T3FREE", "THYROIDAB" in the last 72 hours.  Invalid input(s): "FREET3" Anemia work up No results for input(s): "VITAMINB12", "FOLATE", "FERRITIN", "TIBC", "IRON", "RETICCTPCT" in the last 72 hours. Urinalysis    Component Value Date/Time   COLORURINE STRAW (A) 10/07/2021 2231   APPEARANCEUR CLEAR 10/07/2021 2231   LABSPEC 1.008 10/07/2021 2231   PHURINE 6.0 10/07/2021 2231   GLUCOSEU NEGATIVE 10/07/2021 2231   GLUCOSEU NEGATIVE 01/20/2012 0819   HGBUR NEGATIVE 10/07/2021 2231   BILIRUBINUR NEGATIVE 10/07/2021 2231   BILIRUBINUR neg 06/10/2021 1214   KETONESUR NEGATIVE 10/07/2021 2231   PROTEINUR NEGATIVE 10/07/2021 2231   UROBILINOGEN negative (A) 06/10/2021 1214   UROBILINOGEN 0.2 01/20/2012 0819   NITRITE NEGATIVE 10/07/2021 2231   LEUKOCYTESUR NEGATIVE 10/07/2021 2231   Sepsis Labs Recent Labs  Lab 10/07/21 2217 10/08/21 0417  WBC 6.6 8.6   Microbiology No results found for this or any previous visit (from the past 240 hour(s)).   Time coordinating discharge: 30 minutes  SIGNED:   Phillips Climes, MD  [facility] 10/08/2021, 9:33 AM Pager   If 7PM-7AM, please contact night-coverage [email or web address]

## 2021-10-08 NOTE — ED Notes (Signed)
Pt was able to ambulate to the restroom.  

## 2021-10-08 NOTE — H&P (Signed)
History and Physical    Jaime Chapman QAS:[id number] DOB: 30-Oct-1960 DOA: 10/07/2021  PCP: Jinny Sanders, MD  Patient coming from: Home.  Chief Complaint: Blurred vision.  HPI: Jaime Chapman is a 61 y.o. female with history of fibromyalgia, hypertension, sleep apnea, chronic Lyme was brought to the ER after patient started having sudden onset of blurred vision confusion last night at home.  EMS was called and patient was found to be confused with markedly elevated blood pressure with systolic in the 798X.  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  Over the last 3 weeks her primary care physician noticed that her blood pressure has been running high and was prescribed losartan which she has not picked up yet.  Denies any chest pain shortness of breath headache did have some nausea and generalized weakness.  Patient also states that recently she has a lot of stress at home.  ED Course: In the ER patient was evaluated by neurologist and patient's symptoms were not consistent with stroke.  CT head and MRI were unremarkable.  At this time neurologist feel her symptoms are likely from hypertensive urgency.  Review of Systems: As per HPI, rest all negative.   Past Medical History:  Diagnosis Date   Allergic rhinitis    Anxiety    Asthma    DDD (degenerative disc disease)    also has spinal arthritis   Depression    Diabetes mellitus    borderline   Dyslipidemia    Fibromyalgia    GERD (gastroesophageal reflux disease)    Hypertension    Lung nodules    RIGHT LUNG--LAST EVALUATED BY CHEST CT 02/01/12 - STABLE AND FOLLOW UP PLANNED IN ONE YEAR.   Lyme disease    Obesity    OSA (obstructive sleep apnea) 05/10/2017   Sinusitis    Stress disorder, acute    Vitamin D deficiency     Past Surgical History:  Procedure Laterality Date   CESAREAN SECTION  1982, 1985   x 2    ENDOMETRIAL ABLATION  2009   HERNIA REPAIR  04/14/12   ventral  hernia repair   Laparoscopic ventral hernia repair w/ mesh  08/2009   Dr. Grandville Silos, [facility]   LAPAROSCOPY  2002   TUBAL LIGATION     VENTRAL HERNIA REPAIR  09/2006   Dr. Grandville Silos   VENTRAL HERNIA REPAIR N/A 04/14/2012   Procedure: Laparoscopic Repair of  Ventral Hernia;  Surgeon: Pedro Earls, MD;  Location: [facility];  Service: General;  Laterality: N/A;  Repair of Recurrent Ventral Hernia     reports that she quit smoking about 17 years ago. Her smoking use included cigarettes. She has been exposed to tobacco smoke. She has never used smokeless tobacco. She reports that she does not drink alcohol and does not use drugs.  Allergies  Allergen Reactions   Progesterone     Stroke-like symptoms   Codeine     REACTION: nausea,vomiting,dizziness   Cymbalta [Duloxetine Hcl]     Pt states it caused weight gain    Family History  Problem Relation Age of Onset   Diabetes Mother    Heart disease Mother 27   Arthritis Mother    Lung cancer Maternal Grandmother    Cancer Maternal Grandmother        lung   Colon cancer Neg Hx     Prior to Admission medications   Medication Sig Start Date End Date Taking? Authorizing  Provider  albuterol (VENTOLIN HFA) 108 (90 Base) MCG/ACT inhaler INHALE 2 PUFFS INTO THE LUNGS EVERY 4 HOURS AS NEEDED FOR [facility] 12/14/18   Parrett, Tammy S, NP  estradiol (VIVELLE-DOT) 0.1 MG/24HR patch Place 1 patch onto the skin 2 (two) times a week. 08/24/19   [provider]  levothyroxine (SYNTHROID) 25 MCG tablet Take 25 mcg by mouth daily. 12/12/19   [provider]  progesterone (PROMETRIUM) 200 MG capsule Take 200 mg by mouth daily. 08/05/20   [provider]  ursodiol (ACTIGALL) 300 MG capsule Take 300 mg by mouth 2 (two) times daily. 12/10/20   [provider]  venlafaxine (EFFEXOR) 25 MG tablet TAKE 1 TABLET BY MOUTH TWICE A DAY 08/28/21   Bedsole, [name] E, MD    Physical Exam: Constitutional: Moderately built and nourished. Vitals:    10/08/21 0215 10/08/21 0230 10/08/21 0300 10/08/21 0315  BP: (!) 150/79 (!) 162/78 (!) 142/69 122/73  Pulse: 98 (!) 111 90 86  Resp: 17 (!) '21 14 18  '$ Temp:      TempSrc:      SpO2: 99% 98% 98% 98%  Weight:       Eyes: Anicteric no pallor. ENMT: No discharge from the ears eyes nose and mouth. Neck: No mass felt.  No neck rigidity. Respiratory: No rhonchi or crepitations. Cardiovascular: S1-S2 heard. Abdomen: Soft nontender bowel sounds present. Musculoskeletal: No edema. Skin: No rash. Neurologic: Alert awake oriented time place and person.  Moves all extremities 5 x 5.  No facial asymmetry tongue is midline pupils are equal and reacting to light. Psychiatric: Appears normal.  Normal affect.   Labs on Admission: I have personally reviewed following labs and imaging studies  CBC: Recent Labs  Lab 10/07/21 2217 10/07/21 2223  WBC 6.6  --   NEUTROABS 3.8  --   HGB 13.7 13.9  HCT 41.9 41.0  MCV 89.1  --   PLT 230  --    Basic Metabolic Panel: Recent Labs  Lab 10/07/21 2217 10/07/21 2223  NA 140 140  K 3.8 3.6  CL 107 106  CO2 25  --   GLUCOSE 117* 114*  BUN 20 22*  CREATININE 0.93 1.00  CALCIUM 9.5  --    GFR: Estimated Creatinine Clearance: 62.3 mL/min (by C-G formula based on SCr of 1 mg/dL). Liver Function Tests: Recent Labs  Lab 10/07/21 2217  AST 19  ALT 13  ALKPHOS 51  BILITOT 0.6  PROT 6.2*  ALBUMIN 4.0   No results for input(s): "LIPASE", "AMYLASE" in the last 168 hours. Recent Labs  Lab 10/07/21 2217  AMMONIA 33   Coagulation Profile: Recent Labs  Lab 10/07/21 2217  INR 1.0   Cardiac Enzymes: No results for input(s): "CKTOTAL", "CKMB", "CKMBINDEX", "TROPONINI" in the last 168 hours. BNP (last 3 results) No results for input(s): "PROBNP" in the last 8760 hours. HbA1C: No results for input(s): "HGBA1C" in the last 72 hours. CBG: Recent Labs  Lab 10/07/21 2215  GLUCAP 115*   Lipid Profile: No results for input(s): "CHOL",  "HDL", "LDLCALC", "TRIG", "CHOLHDL", "LDLDIRECT" in the last 72 hours. Thyroid Function Tests: No results for input(s): "TSH", "T4TOTAL", "FREET4", "T3FREE", "THYROIDAB" in the last 72 hours. Anemia Panel: No results for input(s): "VITAMINB12", "FOLATE", "FERRITIN", "TIBC", "IRON", "RETICCTPCT" in the last 72 hours. Urine analysis:    Component Value Date/Time   COLORURINE STRAW (A) 10/07/2021 2231   APPEARANCEUR CLEAR 10/07/2021 2231   LABSPEC 1.008 10/07/2021 2231   PHURINE 6.0  10/07/2021 2231   GLUCOSEU NEGATIVE 10/07/2021 2231   GLUCOSEU NEGATIVE 01/20/2012 0819   HGBUR NEGATIVE 10/07/2021 2231   BILIRUBINUR NEGATIVE 10/07/2021 2231   BILIRUBINUR neg 06/10/2021 [place] 10/07/2021 2231   PROTEINUR NEGATIVE 10/07/2021 2231   UROBILINOGEN negative (A) 06/10/2021 1214   UROBILINOGEN 0.2 01/20/2012 0819   NITRITE NEGATIVE 10/07/2021 2231   LEUKOCYTESUR NEGATIVE 10/07/2021 2231   Sepsis Labs: '@LABRCNTIP'$ (procalcitonin:4,lacticidven:4) )No results found for this or any previous visit (from the past 240 hour(s)).   Radiological Exams on Admission: MR BRAIN WO CONTRAST  Result Date: 10/08/2021 CLINICAL DATA:  Initial evaluation for neuro deficit, stroke suspected. EXAM: MRI HEAD WITHOUT CONTRAST TECHNIQUE: Multiplanar, multiecho pulse sequences of the brain and surrounding structures were obtained without intravenous contrast. COMPARISON:  CT from 10/07/2021. FINDINGS: Brain: Cerebral volume within normal limits for patient age. No focal parenchymal signal abnormality identified. No abnormal foci of restricted diffusion to suggest acute or subacute ischemia. Gray-white matter differentiation well maintained. No encephalomalacia to suggest chronic infarction. No foci of susceptibility artifact to suggest acute or chronic intracranial hemorrhage. No mass lesion, midline shift or mass effect. No hydrocephalus. No extra-axial fluid collection. Pituitary gland and suprasellar  region are normal. Midline structures intact and normal. Vascular: Major intracranial vascular flow voids are well maintained. Skull and upper cervical spine: Craniocervical junction within normal limits. Bone marrow signal intensity normal. No scalp soft tissue abnormality. Sinuses/Orbits: Globes and orbital soft tissues within normal limits. Paranasal sinuses are largely clear.  No mastoid effusion. Other: None. IMPRESSION: Normal brain MRI. No acute intracranial abnormality identified. Electronically Signed   By: Jeannine Boga M.D.   On: 10/08/2021 01:03   DG Chest Port 1 View  Result Date: 10/07/2021 CLINICAL DATA:  Altered level of consciousness, blurred vision EXAM: PORTABLE CHEST 1 VIEW COMPARISON:  03/24/2020 FINDINGS: Single frontal view of the chest demonstrates a stable cardiac silhouette. Prominent epicardial fat pad obscures the right heart border. No airspace disease, effusion, or pneumothorax. No acute bony abnormality. IMPRESSION: 1. No acute intrathoracic process. Electronically Signed   By: Randa Ngo M.D.   On: 10/07/2021 22:52   CT HEAD CODE STROKE WO CONTRAST  Result Date: 10/07/2021 CLINICAL DATA:  Code stroke. Initial evaluation for neuro deficit, stroke suspected. EXAM: CT HEAD WITHOUT CONTRAST TECHNIQUE: Contiguous axial images were obtained from the base of the skull through the vertex without intravenous contrast. RADIATION DOSE REDUCTION: This exam was performed according to the departmental dose-optimization program which includes automated exposure control, adjustment of the mA and/or kV according to patient size and/or use of iterative reconstruction technique. COMPARISON:  Prior study from 03/08/2019. FINDINGS: Brain: Cerebral volume within normal limits. No acute intracranial hemorrhage. No acute large vessel territory infarct. No mass lesion, mass effect or midline shift. No hydrocephalus or extra-axial fluid collection. Vascular: No hyperdense vessel. Skull: Scalp  soft tissues and calvarium within normal limits. Sinuses/Orbits: Globes orbital soft tissues demonstrate no acute finding. Small right maxillary sinus retention cyst. Mucoperiosteal thickening present within the ethmoidal air cells. No mastoid effusion. Other: None. ASPECTS [facility] Stroke Program Early CT Score) - Ganglionic level infarction (caudate, lentiform nuclei, internal capsule, insula, M1-M3 cortex): 7 - Supraganglionic infarction (M4-M6 cortex): 30 Total score (0-10 with 10 being normal): 10 IMPRESSION: 1. Negative head CT with no acute intracranial abnormality identified. 2. ASPECTS is 10. These results were communicated to Dr. Rory Percy at 10:35 pm on 10/07/2021 by text page via the [facility] messaging system. Electronically Signed  By: Jeannine Boga M.D.   On: 10/07/2021 22:37    EKG: Independently reviewed.  Normal sinus rhythm.  Assessment/Plan Principal Problem:   Hypertensive urgency Active Problems:   Hypothyroidism    Hypertensive urgency -likely causing her symptoms.  Appreciate neurology consult.  Discussed with neurologist.  Patient blood pressure improved after patient was placed on nitroglycerin patch and was given IV hydralazine.  Will start patient on losartan 25 mg p.o. daily.  Follow blood pressure trends. Anxiety -likely contributing to symptoms.  Will keep patient on as needed hydroxyzine for now. Hypothyroidism we need to verify patient's home dose. History of chronic Lyme disease follows with clinic in [place].  Has had an injection of her right knee about 3 weeks ago. History of fibromyalgia.   DVT prophylaxis: Lovenox. Code Status: Full code. Family Communication: Patient's mother at the bedside. Disposition Plan: Home. Consults called: Neurology. Admission status: Observation.   Rise Patience MD [facility] Pager [phone].  If 7PM-7AM, please contact night-coverage [email or web address] Password [email or web address]  10/08/2021, 4:05 AM

## 2021-10-15 ENCOUNTER — Ambulatory Visit: Payer: BC Managed Care – PPO | Admitting: Family Medicine

## 2021-10-15 ENCOUNTER — Encounter: Payer: Self-pay | Admitting: Family Medicine

## 2021-10-15 VITALS — BP 126/80 | HR 74 | Temp 97.6°F | Ht 64.5 in | Wt 173.4 lb

## 2021-10-15 DIAGNOSIS — R1024 Suprapubic pain: Secondary | ICD-10-CM | POA: Insufficient documentation

## 2021-10-15 DIAGNOSIS — R102 Pelvic and perineal pain: Secondary | ICD-10-CM | POA: Diagnosis not present

## 2021-10-15 DIAGNOSIS — I10 Essential (primary) hypertension: Secondary | ICD-10-CM | POA: Diagnosis not present

## 2021-10-15 DIAGNOSIS — I160 Hypertensive urgency: Secondary | ICD-10-CM

## 2021-10-15 LAB — BASIC METABOLIC PANEL
BUN: 15 mg/dL (ref 6–23)
CO2: 27 mEq/L (ref 19–32)
Calcium: 9.3 mg/dL (ref 8.4–10.5)
Chloride: 104 mEq/L (ref 96–112)
Creatinine, Ser: 0.91 mg/dL (ref 0.40–1.20)
GFR: 68.63 mL/min (ref >60.00)
Glucose, Bld: 91 mg/dL (ref 70–99)
Potassium: 4.2 mEq/L (ref 3.5–5.1)
Sodium: 141 mEq/L (ref 135–145)

## 2021-10-15 LAB — POC URINALSYSI DIPSTICK (AUTOMATED)
Bilirubin, UA: NEGATIVE
Glucose, UA: NEGATIVE
Ketones, UA: NEGATIVE
Nitrite, UA: NEGATIVE
Protein, UA: POSITIVE — AB
Spec Grav, UA: 1.025 (ref 1.010–1.025)
Urobilinogen, UA: 0.2 E.U./dL
pH, UA: 6 (ref 5.0–8.0)

## 2021-10-15 LAB — POCT UA - MICROSCOPIC ONLY

## 2021-10-15 MED ORDER — LOSARTAN POTASSIUM-HCTZ 50-12.5 MG PO TABS: 1.0000 | ORAL_TABLET | Freq: Every day | ORAL | 3 refills | Status: DC

## 2021-10-15 MED ORDER — CEPHALEXIN 500 MG PO CAPS: 500.0000 mg | ORAL_CAPSULE | Freq: Three times a day (TID) | ORAL | 0 refills | Status: DC

## 2021-10-15 NOTE — Patient Instructions (Addendum)
Change Blood pressure medication to losartan/ HCTZ 50/12.5 mg daily  Follow BP at home.[name] goal < 140/90.  Please stop at the lab to have labs drawn.  Start keflex three times daily x 7 days... we will call with culture results

## 2021-10-15 NOTE — Assessment & Plan Note (Addendum)
Chronic, recent worsening now improved but not at goal  Possibly secondary to pred gestrinone for increased stress  Will increase losartan to 50 mg daily and add hydrochlorothiazide 12.5 mg p.o. daily.  Follow blood pressure at home, reviewed return precautions and goal blood pressure less than 140/90.  Encouraged exercise, weight loss, healthy eating habits.

## 2021-10-15 NOTE — Assessment & Plan Note (Signed)
Acute, now resolved No evidence of endorgan damage on lab evaluation in ER.

## 2021-10-15 NOTE — Addendum Note (Signed)
Addended by: Brenton Grills on: 8/40/3754 36:06 PM   Modules accepted: Orders

## 2021-10-15 NOTE — Assessment & Plan Note (Signed)
Urinalysis suggests acute cystitis.  Will send urine for culture but given she is going out of town this weekend I will start her on Keflex 500 mg p.o. 3 times daily x7 days.  Of note she was treated with Macrobid in April and urine culture returned negative

## 2021-10-15 NOTE — Progress Notes (Signed)
Patient ID: Jaime Chapman, female    DOB: 1960-12-09, 61 y.o.   MRN: [id number]  This visit was conducted in person.  BP 126/80   Pulse 74   Temp 97.6 F (36.4 C) (Temporal)   Ht 5' 4.5" (1.638 m)   Wt 173 lb 6 oz (78.6 kg)   SpO2 97%   BMI 29.30 kg/m    CC:  Chief Complaint  Patient presents with   Hospitalization Follow-up    Seen on 10/07/21 at [facility] ED, dx weakness.     Subjective:   HPI: Jaime Chapman is a 61 y.o. female presenting on 10/15/2021 for Hospitalization Follow-up (Seen on 10/07/21 at [facility] ED, dx weakness. )  Reviewed ED visit from October 07, 2021 Presented with altered mental status with blurry vision 3 hours prior to ER visit.  Code stroke was initiated.  Blood pressure was elevated at 212/120.  She was supposed to have started lisinopril but never picked this up. Lab and imaging reviewed in detail.  CT head unremarkable, each EKG showing sinus rhythm heart rate 61. No concerns noted with lab work MRI brain: No acute intracranial process. Evaluated  by neurology and had ED Symptoms were felt to be due to hypertensive emergency. She was treated with multiple doses of hydralazine.  At discharge it was recommended to start losartan 25 mg daily.  Today she reports BP has been  inadequately controlled.. 171/101, 177/89, 151/89  Lowest is 144/100  Cuff is arm.. new in last 8 months.  Occ swelling in legs  She has been under more stress.  BP Readings from Last 3 Encounters:  10/15/21 126/80  10/08/21 118/66  06/10/21 134/74   Wt Readings from Last 3 Encounters:  10/15/21 173 lb 6 oz (78.6 kg)  10/07/21 179 lb 0.2 oz (81.2 kg)  06/10/21 180 lb 6 oz (81.8 kg)  Body mass index is 29.3 kg/m.   She also noted she has been seen  06/23/21 but Phebe Colla , NP UTI ( macrobid).. treated with antibiotics.  Urine culture returned no growth.  Now with recurrent urinary pressure and low abd pain  No dysuria, does have increase in urgency.. sometimes only trickle. No  fever. No flank pain.  She doe shave issues with constipation      Relevant past medical, surgical, family and social history reviewed and updated as indicated. Interim medical history since our last visit reviewed. Allergies and medications reviewed and updated. Outpatient Medications Prior to Visit  Medication Sig Dispense Refill   albuterol (VENTOLIN HFA) 108 (90 Base) MCG/ACT inhaler INHALE 2 PUFFS INTO THE LUNGS EVERY 4 HOURS AS NEEDED FOR WHEEIZNG 18 g 3   estradiol (VIVELLE-DOT) 0.1 MG/24HR patch Place 1 patch onto the skin 2 (two) times a week.     losartan (COZAAR) 25 MG tablet Take 1 tablet (25 mg total) by mouth daily. 30 tablet 0   NP THYROID PO Take by mouth daily.     progesterone (PROMETRIUM) 200 MG capsule Take 200 mg by mouth daily.     levothyroxine (SYNTHROID) 25 MCG tablet Take 25 mcg by mouth daily.     ursodiol (ACTIGALL) 300 MG capsule Take 300 mg by mouth 2 (two) times daily.     venlafaxine (EFFEXOR) 25 MG tablet TAKE 1 TABLET BY MOUTH TWICE A DAY 180 tablet 0   No facility-administered medications prior to visit.     Per HPI unless specifically indicated in ROS section below Review of Systems  Constitutional:  Negative for fatigue and fever.  HENT:  Negative for congestion.   Eyes:  Negative for pain.  Respiratory:  Negative for cough and shortness of breath.   Cardiovascular:  Negative for chest pain, palpitations and leg swelling.  Gastrointestinal:  Negative for abdominal pain.  Genitourinary:  Positive for pelvic pain and urgency. Negative for dysuria and vaginal bleeding.  Musculoskeletal:  Negative for back pain.  Neurological:  Negative for syncope, light-headedness and headaches.  Psychiatric/Behavioral:  Negative for dysphoric mood.    Objective:  BP 126/80   Pulse 74   Temp 97.6 F (36.4 C) (Temporal)   Ht 5' 4.5" (1.638 m)   Wt 173 lb 6 oz (78.6 kg)   SpO2 97%   BMI 29.30 kg/m   Wt Readings from Last 3 Encounters:  10/15/21 173 lb 6 oz  (78.6 kg)  10/07/21 179 lb 0.2 oz (81.2 kg)  06/10/21 180 lb 6 oz (81.8 kg)      Physical Exam Constitutional:      General: She is not in acute distress.    Appearance: Normal appearance. She is well-developed. She is not ill-appearing or toxic-appearing.  HENT:     Head: Normocephalic.     Right Ear: Hearing, tympanic membrane, ear canal and external ear normal. Tympanic membrane is not erythematous, retracted or bulging.     Left Ear: Hearing, tympanic membrane, ear canal and external ear normal. Tympanic membrane is not erythematous, retracted or bulging.     Nose: No mucosal edema or rhinorrhea.     Right Sinus: No maxillary sinus tenderness or frontal sinus tenderness.     Left Sinus: No maxillary sinus tenderness or frontal sinus tenderness.     Mouth/Throat:     Pharynx: Uvula midline.  Eyes:     General: Lids are normal. Lids are everted, no foreign bodies appreciated.     Conjunctiva/sclera: Conjunctivae normal.     Pupils: Pupils are equal, round, and reactive to light.  Neck:     Thyroid: No thyroid mass or thyromegaly.     Vascular: No carotid bruit.     Trachea: Trachea normal.  Cardiovascular:     Rate and Rhythm: Normal rate and regular rhythm.     Pulses: Normal pulses.     Heart sounds: Normal heart sounds, S1 normal and S2 normal. No murmur heard.    No friction rub. No gallop.  Pulmonary:     Effort: Pulmonary effort is normal. No tachypnea or respiratory distress.     Breath sounds: Normal breath sounds. No decreased breath sounds, wheezing, rhonchi or rales.  Abdominal:     General: Bowel sounds are normal.     Palpations: Abdomen is soft.     Tenderness: There is abdominal tenderness in the suprapubic area.  Musculoskeletal:     Cervical back: Normal range of motion and neck supple.  Skin:    General: Skin is warm and dry.     Findings: No rash.  Neurological:     Mental Status: She is alert.  Psychiatric:        Mood and Affect: Mood is not  anxious or depressed.        Speech: Speech normal.        Behavior: Behavior normal. Behavior is cooperative.        Thought Content: Thought content normal.        Judgment: Judgment normal.       Results for orders placed or performed during the hospital encounter  of 10/07/21  Protime-INR  Result Value Ref Range   Prothrombin Time 12.6 11.4 - 15.2 seconds   INR 1.0 0.8 - 1.2  APTT  Result Value Ref Range   aPTT 29 24 - 36 seconds  CBC  Result Value Ref Range   WBC 6.6 4.0 - 10.5 K/uL   RBC 4.70 3.87 - 5.11 MIL/uL   Hemoglobin 13.7 12.0 - 15.0 g/dL   HCT 41.9 36.0 - 46.0 %   MCV 89.1 80.0 - 100.0 fL   MCH 29.1 26.0 - 34.0 pg   MCHC 32.7 30.0 - 36.0 g/dL   RDW 12.4 11.5 - 15.5 %   Platelets 230 150 - 400 K/uL   nRBC 0.0 0.0 - 0.2 %  Differential  Result Value Ref Range   Neutrophils Relative % 57 %   Neutro Abs 3.8 1.7 - 7.7 K/uL   Lymphocytes Relative 33 %   Lymphs Abs 2.2 0.7 - 4.0 K/uL   Monocytes Relative 7 %   Monocytes Absolute 0.4 0.1 - 1.0 K/uL   Eosinophils Relative 2 %   Eosinophils Absolute 0.2 0.0 - 0.5 K/uL   Basophils Relative 1 %   Basophils Absolute 0.0 0.0 - 0.1 K/uL   Immature Granulocytes 0 %   Abs Immature Granulocytes 0.02 0.00 - 0.07 K/uL  Comprehensive metabolic panel  Result Value Ref Range   Sodium 140 135 - 145 mmol/L   Potassium 3.8 3.5 - 5.1 mmol/L   Chloride 107 98 - 111 mmol/L   CO2 25 22 - 32 mmol/L   Glucose, Bld 117 (H) 70 - 99 mg/dL   BUN 20 6 - 20 mg/dL   Creatinine, Ser 0.93 0.44 - 1.00 mg/dL   Calcium 9.5 8.9 - 10.3 mg/dL   Total Protein 6.2 (L) 6.5 - 8.1 g/dL   Albumin 4.0 3.5 - 5.0 g/dL   AST 19 15 - 41 U/L   ALT 13 0 - 44 U/L   Alkaline Phosphatase 51 38 - 126 U/L   Total Bilirubin 0.6 0.3 - 1.2 mg/dL   GFR, Estimated >60 >60 mL/min   Anion gap 8 5 - 15  Ethanol  Result Value Ref Range   Alcohol, Ethyl (B) <10 <10 mg/dL  Urinalysis, Routine w reflex microscopic Urine, Clean Catch  Result Value Ref Range   Color,  Urine STRAW (A) YELLOW   APPearance CLEAR CLEAR   Specific Gravity, Urine 1.008 1.005 - 1.030   pH 6.0 5.0 - 8.0   Glucose, UA NEGATIVE NEGATIVE mg/dL   Hgb urine dipstick NEGATIVE NEGATIVE   Bilirubin Urine NEGATIVE NEGATIVE   Ketones, ur NEGATIVE NEGATIVE mg/dL   Protein, ur NEGATIVE NEGATIVE mg/dL   Nitrite NEGATIVE NEGATIVE   Leukocytes,Ua NEGATIVE NEGATIVE  Ammonia  Result Value Ref Range   Ammonia 33 9 - 35 umol/L  Rapid urine drug screen (hospital performed)  Result Value Ref Range   Opiates NONE DETECTED NONE DETECTED   Cocaine NONE DETECTED NONE DETECTED   Benzodiazepines NONE DETECTED NONE DETECTED   Amphetamines NONE DETECTED NONE DETECTED   Tetrahydrocannabinol NONE DETECTED NONE DETECTED   Barbiturates NONE DETECTED NONE DETECTED  HIV Antibody (routine testing w rflx)  Result Value Ref Range   HIV Screen 4th Generation wRfx Non Reactive Non Reactive  Basic metabolic panel  Result Value Ref Range   Sodium 141 135 - 145 mmol/L   Potassium 3.3 (L) 3.5 - 5.1 mmol/L   Chloride 108 98 - 111 mmol/L   CO2  26 22 - 32 mmol/L   Glucose, Bld 161 (H) 70 - 99 mg/dL   BUN 21 (H) 6 - 20 mg/dL   Creatinine, Ser 0.96 0.44 - 1.00 mg/dL   Calcium 9.1 8.9 - 10.3 mg/dL   GFR, Estimated >60 >60 mL/min   Anion gap 7 5 - 15  Hepatic function panel  Result Value Ref Range   Total Protein 6.1 (L) 6.5 - 8.1 g/dL   Albumin 3.5 3.5 - 5.0 g/dL   AST 12 (L) 15 - 41 U/L   ALT 14 0 - 44 U/L   Alkaline Phosphatase 42 38 - 126 U/L   Total Bilirubin 0.6 0.3 - 1.2 mg/dL   Bilirubin, Direct <0.1 0.0 - 0.2 mg/dL   Indirect Bilirubin NOT CALCULATED 0.3 - 0.9 mg/dL  CBC with Differential/Platelet  Result Value Ref Range   WBC 8.6 4.0 - 10.5 K/uL   RBC 4.51 3.87 - 5.11 MIL/uL   Hemoglobin 13.4 12.0 - 15.0 g/dL   HCT 39.8 36.0 - 46.0 %   MCV 88.2 80.0 - 100.0 fL   MCH 29.7 26.0 - 34.0 pg   MCHC 33.7 30.0 - 36.0 g/dL   RDW 12.7 11.5 - 15.5 %   Platelets 224 150 - 400 K/uL   nRBC 0.0 0.0 -  0.2 %   Neutrophils Relative % 64 %   Neutro Abs 5.7 1.7 - 7.7 K/uL   Lymphocytes Relative 26 %   Lymphs Abs 2.2 0.7 - 4.0 K/uL   Monocytes Relative 6 %   Monocytes Absolute 0.5 0.1 - 1.0 K/uL   Eosinophils Relative 2 %   Eosinophils Absolute 0.1 0.0 - 0.5 K/uL   Basophils Relative 1 %   Basophils Absolute 0.0 0.0 - 0.1 K/uL   Immature Granulocytes 1 %   Abs Immature Granulocytes 0.05 0.00 - 0.07 K/uL  I-stat chem 8, ED  Result Value Ref Range   Sodium 140 135 - 145 mmol/L   Potassium 3.6 3.5 - 5.1 mmol/L   Chloride 106 98 - 111 mmol/L   BUN 22 (H) 6 - 20 mg/dL   Creatinine, Ser 1.00 0.44 - 1.00 mg/dL   Glucose, Bld 114 (H) 70 - 99 mg/dL   Calcium, Ion 1.11 (L) 1.15 - 1.40 mmol/L   TCO2 24 22 - 32 mmol/L   Hemoglobin 13.9 12.0 - 15.0 g/dL   HCT 41.0 36.0 - 46.0 %  CBG monitoring, ED  Result Value Ref Range   Glucose-Capillary 115 (H) 70 - 99 mg/dL  I-Stat beta hCG blood, ED  Result Value Ref Range   I-stat hCG, quantitative <5.0 <5 mIU/mL   Comment 3             COVID 19 screen:  No recent travel or known exposure to COVID19 The patient denies respiratory symptoms of COVID 19 at this time. The importance of social distancing was discussed today.   Assessment and Plan    Problem List Items Addressed This Visit     Essential hypertension    Chronic, recent worsening now improved but not at goal  Possibly secondary to pred gestrinone for increased stress  Will increase losartan to 50 mg daily and add hydrochlorothiazide 12.5 mg p.o. daily.  Follow blood pressure at home, reviewed return precautions and goal blood pressure less than 140/90.  Encouraged exercise, weight loss, healthy eating habits.        Relevant Medications   losartan-hydrochlorothiazide (HYZAAR) 50-12.5 MG tablet   Other Relevant Orders  Basic Metabolic Panel   Hypertensive urgency - Primary    Acute, now resolved No evidence of endorgan damage on lab evaluation in ER.      Relevant  Medications   losartan-hydrochlorothiazide (HYZAAR) 50-12.5 MG tablet   Suprapubic pressure    Urinalysis suggests acute cystitis.  Will send urine for culture but given she is going out of town this weekend I will start her on Keflex 500 mg p.o. 3 times daily x7 days.  Of note she was treated with Macrobid in April and urine culture returned negative      Relevant Orders   POCT Urinalysis Dipstick (Automated) (Completed)   POCT UA - Microscopic Only (Completed)   Meds ordered this encounter  Medications   losartan-hydrochlorothiazide (HYZAAR) 50-12.5 MG tablet    Sig: Take 1 tablet by mouth daily.    Dispense:  90 tablet    Refill:  3   cephALEXin (KEFLEX) 500 MG capsule    Sig: Take 1 capsule (500 mg total) by mouth 3 (three) times daily.    Dispense:  21 capsule    Refill:  0     Eliezer Lofts, MD

## 2021-10-16 LAB — URINE CULTURE
MICRO NUMBER:: 13794019
Result:: NO GROWTH
SPECIMEN QUALITY:: ADEQUATE

## 2021-10-23 ENCOUNTER — Ambulatory Visit: Payer: BC Managed Care – PPO | Admitting: Pulmonary Disease

## 2021-10-23 ENCOUNTER — Encounter: Payer: Self-pay | Admitting: Pulmonary Disease

## 2021-10-23 VITALS — BP 130/70 | HR 73 | Ht 64.0 in | Wt 174.4 lb

## 2021-10-23 DIAGNOSIS — G4733 Obstructive sleep apnea (adult) (pediatric): Secondary | ICD-10-CM

## 2021-10-23 NOTE — Progress Notes (Signed)
Jaime Chapman    [id number]    11-Sep-1960  Primary Care Physician:Bedsole, Mervyn Gay, MD  Referring Physician: Jinny Sanders, MD [address] [place],  [place] [address]  Chief complaint:   Follow-up for obstructive sleep apnea Has been doing relatively well  HPI:  Did have gastric sleeve surgery in October 2022 Has lost over 40 pounds -Was diagnosed with moderate obstructive sleep apnea  -Not able to tolerate CPAP therapy  Since significant weight loss, sleeping better Functioning better No longer snoring No daytime sleepiness  Denies daytime fatigue Sleep quality feels much better  History of asthma -Only uses albuterol as needed -Uses Singulair at bedtime  History of nasal bridge injury with deviated septum  History of lung nodules that were followed in the past and stable from 2013   Outpatient Encounter Medications as of 10/23/2021  Medication Sig   albuterol (VENTOLIN HFA) 108 (90 Base) MCG/ACT inhaler INHALE 2 PUFFS INTO THE LUNGS EVERY 4 HOURS AS NEEDED FOR WHEEIZNG   cephALEXin (KEFLEX) 500 MG capsule Take 1 capsule (500 mg total) by mouth 3 (three) times daily.   estradiol (VIVELLE-DOT) 0.1 MG/24HR patch Place 1 patch onto the skin 2 (two) times a week.   losartan-hydrochlorothiazide (HYZAAR) 50-12.5 MG tablet Take 1 tablet by mouth daily.   NP THYROID PO Take by mouth daily.   progesterone (PROMETRIUM) 200 MG capsule Take 200 mg by mouth daily.   No facility-administered encounter medications on file as of 10/23/2021.    Allergies as of 10/23/2021 - Review Complete 10/23/2021  Allergen Reaction Noted   Progesterone  01/13/2012   Codeine     Cymbalta [duloxetine hcl]  10/22/2010    Past Medical History:  Diagnosis Date   Allergic rhinitis    Anxiety    Asthma    DDD (degenerative disc disease)    also has spinal arthritis   Depression    Diabetes mellitus    borderline   Dyslipidemia    Fibromyalgia    GERD  (gastroesophageal reflux disease)    Hypertension    Lung nodules    RIGHT LUNG--LAST EVALUATED BY CHEST CT 02/01/12 - STABLE AND FOLLOW UP PLANNED IN ONE YEAR.   Lyme disease    Obesity    OSA (obstructive sleep apnea) 05/10/2017   Sinusitis    Stress disorder, acute    Vitamin D deficiency     Past Surgical History:  Procedure Laterality Date   CESAREAN SECTION  1982, 1985   x 2    ENDOMETRIAL ABLATION  2009   HERNIA REPAIR  04/14/12   ventral hernia repair   Laparoscopic ventral hernia repair w/ mesh  08/2009   Dr. Grandville Silos, [facility]   LAPAROSCOPY  2002   TUBAL LIGATION     VENTRAL HERNIA REPAIR  09/2006   Dr. Grandville Silos   VENTRAL HERNIA REPAIR N/A 04/14/2012   Procedure: Laparoscopic Repair of  Ventral Hernia;  Surgeon: Pedro Earls, MD;  Location: [facility];  Service: General;  Laterality: N/A;  Repair of Recurrent Ventral Hernia    Family History  Problem Relation Age of Onset   Diabetes Mother    Heart disease Mother 54   Arthritis Mother    Lung cancer Maternal Grandmother    Cancer Maternal Grandmother        lung   Colon cancer Neg Hx     Social History   Socioeconomic History   Marital status: Married  Spouse name: Simona Huh   Number of children: 2   Years of education: Not on file   Highest education level: Not on file  Occupational History   Occupation: Corporate treasurer  Tobacco Use   Smoking status: Former    Types: Cigarettes    Quit date: 03/01/2004    Years since quitting: 17.6    Passive exposure: Past   Smokeless tobacco: Never  Vaping Use   Vaping Use: Never used  Substance and Sexual Activity   Alcohol use: No    Alcohol/week: 1.0 standard drink of alcohol    Types: 1 Standard drinks or equivalent per week   Drug use: No    Comment: remote majijuana   Sexual activity: Yes    Partners: Male  Other Topics Concern   Not on file  Social History Narrative   2 caffeine drinks daily     Married   2 grown daughters, one is a crack addict and is  in jail, she cares for her grandchildren.   Social Determinants of Health   Financial Resource Strain: Not on file  Food Insecurity: Not on file  Transportation Needs: Not on file  Physical Activity: Not on file  Stress: Not on file  Social Connections: Not on file  Intimate Partner Violence: Not on file    Review of Systems  Constitutional:  Positive for fatigue.  Respiratory:  Positive for apnea.   Psychiatric/Behavioral:  Positive for sleep disturbance.     Vitals:   10/23/21 1104  BP: 130/70  Pulse: 73  SpO2: 96%     Physical Exam Constitutional:      Appearance: She is obese.  HENT:     Head: Normocephalic.     Mouth/Throat:     Mouth: Mucous membranes are moist.     Comments: Mallampati 3, crowded oropharynx Cardiovascular:     Rate and Rhythm: Normal rate and regular rhythm.     Heart sounds: No murmur heard.    No friction rub.  Pulmonary:     Effort: No respiratory distress.     Breath sounds: No stridor. No wheezing or rhonchi.  Musculoskeletal:     Cervical back: No rigidity or tenderness.  Neurological:     Mental Status: She is alert.  Psychiatric:        Mood and Affect: Mood normal.      Data Reviewed: Most recent sleep study shows moderate obstructive sleep apnea  Assessment:  History of moderate obstructive sleep apnea -Has lost over 40 pounds -No longer with symptoms to suggest presence of sleep apnea -No longer snoring -Sleep is more restorative  Obesity -Post bariatric surgery -Weight continues to improve  Asthma -Symptoms are well controlled at present -Rescue inhaler use as needed  History of lung nodule -Stable on follow-up -Followed up previously and nodules are stable  Plan/Recommendations: Follow-up as needed  Encouraged to continue weight loss efforts  Has no symptoms to suggest presence of sleep disordered breathing at present  Encouraged to maintain efforts to keep weight off     Sherrilyn Rist  MD [facility] 10/23/2021, 11:10 AM  CC: Jinny Sanders, MD

## 2021-12-08 ENCOUNTER — Telehealth: Payer: Self-pay | Admitting: Family Medicine

## 2021-12-08 ENCOUNTER — Other Ambulatory Visit (INDEPENDENT_AMBULATORY_CARE_PROVIDER_SITE_OTHER): Payer: BC Managed Care – PPO

## 2021-12-08 DIAGNOSIS — E039 Hypothyroidism, unspecified: Secondary | ICD-10-CM

## 2021-12-08 DIAGNOSIS — E559 Vitamin D deficiency, unspecified: Secondary | ICD-10-CM

## 2021-12-08 DIAGNOSIS — R7303 Prediabetes: Secondary | ICD-10-CM | POA: Diagnosis not present

## 2021-12-08 DIAGNOSIS — E782 Mixed hyperlipidemia: Secondary | ICD-10-CM

## 2021-12-08 LAB — COMPREHENSIVE METABOLIC PANEL
ALT: 17 U/L (ref 0–35)
AST: 15 U/L (ref 0–37)
Albumin: 4.4 g/dL (ref 3.5–5.2)
Alkaline Phosphatase: 47 U/L (ref 39–117)
BUN: 16 mg/dL (ref 6–23)
CO2: 26 mEq/L (ref 19–32)
Calcium: 9.5 mg/dL (ref 8.4–10.5)
Chloride: 106 mEq/L (ref 96–112)
Creatinine, Ser: 0.91 mg/dL (ref 0.40–1.20)
GFR: 68.56 mL/min (ref >60.00)
Glucose, Bld: 92 mg/dL (ref 70–99)
Potassium: 4 mEq/L (ref 3.5–5.1)
Sodium: 141 mEq/L (ref 135–145)
Total Bilirubin: 0.5 mg/dL (ref 0.2–1.2)
Total Protein: 7.1 g/dL (ref 6.0–8.3)

## 2021-12-08 LAB — LIPID PANEL
Cholesterol: 259 mg/dL — ABNORMAL HIGH (ref 0–200)
HDL: 66.2 mg/dL (ref >39.00)
LDL Cholesterol: 170 mg/dL — ABNORMAL HIGH (ref 0–99)
NonHDL: 192.74
Total CHOL/HDL Ratio: 4
Triglycerides: 114 mg/dL (ref 0.0–149.0)
VLDL: 22.8 mg/dL (ref 0.0–40.0)

## 2021-12-08 LAB — HEMOGLOBIN A1C: Hgb A1c MFr Bld: 5.7 % (ref 4.6–6.5)

## 2021-12-08 NOTE — Telephone Encounter (Signed)
-----   Message from Ellamae Sia sent at 11/26/2021 12:35 PM EDT ----- Regarding: Lab orders for Tuesday, 10.10.23 Patient is scheduled for CPX labs, please order future labs, Thanks , Karna Christmas

## 2021-12-09 LAB — VITAMIN D 25 HYDROXY (VIT D DEFICIENCY, FRACTURES): VITD: 33.46 ng/mL (ref 30.00–100.00)

## 2021-12-09 LAB — T4, FREE: Free T4: 0.52 ng/dL — ABNORMAL LOW (ref 0.60–1.60)

## 2021-12-09 LAB — TSH: TSH: 4.62 u[IU]/mL (ref 0.35–5.50)

## 2021-12-09 LAB — T3, FREE: T3, Free: 3.7 pg/mL (ref 2.3–4.2)

## 2021-12-09 NOTE — Progress Notes (Signed)
No critical labs need to be addressed urgently. We will discuss labs in detail at upcoming office visit.   

## 2021-12-15 ENCOUNTER — Encounter: Payer: Self-pay | Admitting: Family Medicine

## 2021-12-15 ENCOUNTER — Ambulatory Visit (INDEPENDENT_AMBULATORY_CARE_PROVIDER_SITE_OTHER): Payer: BC Managed Care – PPO | Admitting: Family Medicine

## 2021-12-15 VITALS — BP 116/60 | HR 80 | Temp 96.6°F | Resp 16 | Ht 64.5 in | Wt 180.5 lb

## 2021-12-15 DIAGNOSIS — E661 Drug-induced obesity: Secondary | ICD-10-CM | POA: Diagnosis not present

## 2021-12-15 DIAGNOSIS — F988 Other specified behavioral and emotional disorders with onset usually occurring in childhood and adolescence: Secondary | ICD-10-CM

## 2021-12-15 DIAGNOSIS — E039 Hypothyroidism, unspecified: Secondary | ICD-10-CM

## 2021-12-15 DIAGNOSIS — R7303 Prediabetes: Secondary | ICD-10-CM

## 2021-12-15 DIAGNOSIS — Z0000 Encounter for general adult medical examination without abnormal findings: Secondary | ICD-10-CM | POA: Diagnosis not present

## 2021-12-15 DIAGNOSIS — F331 Major depressive disorder, recurrent, moderate: Secondary | ICD-10-CM

## 2021-12-15 DIAGNOSIS — I10 Essential (primary) hypertension: Secondary | ICD-10-CM

## 2021-12-15 DIAGNOSIS — E559 Vitamin D deficiency, unspecified: Secondary | ICD-10-CM

## 2021-12-15 DIAGNOSIS — G4733 Obstructive sleep apnea (adult) (pediatric): Secondary | ICD-10-CM

## 2021-12-15 DIAGNOSIS — E782 Mixed hyperlipidemia: Secondary | ICD-10-CM

## 2021-12-15 DIAGNOSIS — J4540 Moderate persistent asthma, uncomplicated: Secondary | ICD-10-CM

## 2021-12-15 DIAGNOSIS — Z6831 Body mass index (BMI) 31.0-31.9, adult: Secondary | ICD-10-CM

## 2021-12-15 MED ORDER — LEVOTHYROXINE SODIUM 25 MCG PO TABS: 25.0000 ug | ORAL_TABLET | Freq: Every day | ORAL | 11 refills | Status: DC

## 2021-12-15 NOTE — Assessment & Plan Note (Signed)
Stable, chronic.  Continue current medication.   losartan hydrochlorothiazide 50/12.5 mg daily 

## 2021-12-15 NOTE — Progress Notes (Signed)
Patient ID: Jaime Chapman, female    DOB: 11-Sep-1960, 61 y.o.   MRN: [id number]  This visit was conducted in person.  BP 116/60   Pulse 80   Temp (!) 96.6 F (35.9 C) (Temporal)   Resp 16   Ht 5' 4.5" (1.638 m)   Wt 180 lb 8 oz (81.9 kg)   SpO2 98%   BMI 30.50 kg/m    CC:  Chief Complaint  Patient presents with   Annual Exam    Subjective:   HPI: Jaime Chapman is a 61 y.o. female presenting on 12/15/2021 for Annual Exam   Hypertension:  Good control on losartan hydrochlorothiazide 50/12.5 mg daily BP Readings from Last 3 Encounters:  12/15/21 116/60  10/23/21 130/70  10/15/21 126/80  Using medication without problems or lightheadedness:  none Chest pain with exertion:none Edema: none Short of breath: OSA  Average home BPs: Other issues:  Under a lot stress with children and health issues.  Hypothyroidism: Thyroid.[name] looks like low t4 .[name] She is seeing alternative MD Dr. Helene Kelp.[name] on NP thyroid?  Lab Results  Component Value Date   TSH 4.62 12/08/2021   Elevated Cholesterol: Tolerable control on her medication. Lab Results  Component Value Date   CHOL 259 (H) 12/08/2021   HDL 66.20 12/08/2021   LDLCALC 170 (H) 12/08/2021   LDLDIRECT 139.0 09/09/2020   TRIG 114.0 12/08/2021   CHOLHDL 4 12/08/2021   The 10-year ASCVD risk score (Arnett DK, et al., 2019) is: 3.9%   Values used to calculate the score:     Age: 17 years     Sex: Female     Is Non-Hispanic African American: No     Diabetic: No     Tobacco smoker: No     Systolic Blood Pressure: 993 mmHg     Is BP treated: Yes     HDL Cholesterol: 66.2 mg/dL     Total Cholesterol: 259 mg/dL Using medications without problems: Muscle aches:  Diet compliance: moderate... working on Designer, fashion/clothing. Exercise: minimal Other complaints: Seeing Dr. Evorn Gong for bariatric weight loss center.   Prediabetes stable control with diet Lab Results  Component Value Date   HGBA1C 5.7 12/08/2021       Fatty liver disease : Transaminases in the normal range. Wt Readings from Last 3 Encounters:  12/15/21 180 lb 8 oz (81.9 kg)  10/23/21 174 lb 6.4 oz (79.1 kg)  10/15/21 173 lb 6 oz (78.6 kg)    MDD/GAD/Fibromyalgia  Increased stress level. [name] Office Visit from 12/15/2021 in [facility] [place]  PHQ-2 Total Score 1            Relevant past medical, surgical, family and social history reviewed and updated as indicated. Interim medical history since our last visit reviewed. Allergies and medications reviewed and updated. Outpatient Medications Prior to Visit  Medication Sig Dispense Refill   albuterol (VENTOLIN HFA) 108 (90 Base) MCG/ACT inhaler INHALE 2 PUFFS INTO THE LUNGS EVERY 4 HOURS AS NEEDED FOR WHEEIZNG 18 g 3   estradiol (VIVELLE-DOT) 0.1 MG/24HR patch Place 1 patch onto the skin 2 (two) times a week.     losartan-hydrochlorothiazide (HYZAAR) 50-12.5 MG tablet Take 1 tablet by mouth daily. 90 tablet 3   NP THYROID PO Take by mouth daily.     progesterone (PROMETRIUM) 200 MG capsule Take 200 mg by mouth daily.     cephALEXin (KEFLEX) 500 MG capsule Take 1 capsule (500 mg total) by  mouth 3 (three) times daily. 21 capsule 0   No facility-administered medications prior to visit.     Per HPI unless specifically indicated in ROS section below Review of Systems  Constitutional:  Negative for fatigue and fever.  HENT:  Negative for congestion.   Eyes:  Negative for pain.  Respiratory:  Negative for cough and shortness of breath.   Cardiovascular:  Negative for chest pain, palpitations and leg swelling.  Gastrointestinal:  Negative for abdominal pain.  Genitourinary:  Negative for dysuria and vaginal bleeding.  Musculoskeletal:  Negative for back pain.  Neurological:  Negative for syncope, light-headedness and headaches.  Psychiatric/Behavioral:  Negative for dysphoric mood.    Objective:  BP 116/60   Pulse 80   Temp (!) 96.6 F (35.9 C)  (Temporal)   Resp 16   Ht 5' 4.5" (1.638 m)   Wt 180 lb 8 oz (81.9 kg)   SpO2 98%   BMI 30.50 kg/m   Wt Readings from Last 3 Encounters:  12/15/21 180 lb 8 oz (81.9 kg)  10/23/21 174 lb 6.4 oz (79.1 kg)  10/15/21 173 lb 6 oz (78.6 kg)      Physical Exam Constitutional:      General: She is not in acute distress.    Appearance: Normal appearance. She is well-developed. She is obese. She is not ill-appearing or toxic-appearing.  HENT:     Head: Normocephalic.     Right Ear: Hearing, tympanic membrane, ear canal and external ear normal. Tympanic membrane is not erythematous, retracted or bulging.     Left Ear: Hearing, tympanic membrane, ear canal and external ear normal. Tympanic membrane is not erythematous, retracted or bulging.     Nose: No mucosal edema or rhinorrhea.     Right Sinus: No maxillary sinus tenderness or frontal sinus tenderness.     Left Sinus: No maxillary sinus tenderness or frontal sinus tenderness.     Mouth/Throat:     Pharynx: Uvula midline.  Eyes:     General: Lids are normal. Lids are everted, no foreign bodies appreciated.     Conjunctiva/sclera: Conjunctivae normal.     Pupils: Pupils are equal, round, and reactive to light.  Neck:     Thyroid: No thyroid mass or thyromegaly.     Vascular: No carotid bruit.     Trachea: Trachea normal.  Cardiovascular:     Rate and Rhythm: Normal rate and regular rhythm.     Pulses: Normal pulses.     Heart sounds: Normal heart sounds, S1 normal and S2 normal. No murmur heard.    No friction rub. No gallop.  Pulmonary:     Effort: Pulmonary effort is normal. No tachypnea or respiratory distress.     Breath sounds: Normal breath sounds. No decreased breath sounds, wheezing, rhonchi or rales.  Abdominal:     General: Bowel sounds are normal.     Palpations: Abdomen is soft.     Tenderness: There is no abdominal tenderness.  Musculoskeletal:     Cervical back: Normal range of motion and neck supple.  Skin:     General: Skin is warm and dry.     Findings: No rash.  Neurological:     Mental Status: She is alert.  Psychiatric:        Mood and Affect: Mood is not anxious or depressed.        Speech: Speech normal.        Behavior: Behavior normal. Behavior is cooperative.  Thought Content: Thought content normal.        Judgment: Judgment normal.       Results for orders placed or performed in visit on 12/08/21  VITAMIN D 25 Hydroxy (Vit-D Deficiency, Fractures)  Result Value Ref Range   VITD 33.46 30.00 - 100.00 ng/mL  Hemoglobin A1c  Result Value Ref Range   Hgb A1c MFr Bld 5.7 4.6 - 6.5 %  Lipid panel  Result Value Ref Range   Cholesterol 259 (H) 0 - 200 mg/dL   Triglycerides 114.0 0.0 - 149.0 mg/dL   HDL 66.20 >39.00 mg/dL   VLDL 22.8 0.0 - 40.0 mg/dL   LDL Cholesterol 170 (H) 0 - 99 mg/dL   Total CHOL/HDL Ratio 4    NonHDL 192.74   Comprehensive metabolic panel  Result Value Ref Range   Sodium 141 135 - 145 mEq/L   Potassium 4.0 3.5 - 5.1 mEq/L   Chloride 106 96 - 112 mEq/L   CO2 26 19 - 32 mEq/L   Glucose, Bld 92 70 - 99 mg/dL   BUN 16 6 - 23 mg/dL   Creatinine, Ser 0.91 0.40 - 1.20 mg/dL   Total Bilirubin 0.5 0.2 - 1.2 mg/dL   Alkaline Phosphatase 47 39 - 117 U/L   AST 15 0 - 37 U/L   ALT 17 0 - 35 U/L   Total Protein 7.1 6.0 - 8.3 g/dL   Albumin 4.4 3.5 - 5.2 g/dL   GFR 68.56 >60.00 mL/min   Calcium 9.5 8.4 - 10.5 mg/dL  TSH  Result Value Ref Range   TSH 4.62 0.35 - 5.50 uIU/mL  T4, free  Result Value Ref Range   Free T4 0.52 (L) 0.60 - 1.60 ng/dL  T3, free  Result Value Ref Range   T3, Free 3.7 2.3 - 4.2 pg/mL     COVID 19 screen:  No recent travel or known exposure to COVID19 The patient denies respiratory symptoms of COVID 19 at this time. The importance of social distancing was discussed today.   Assessment and Plan   The patient's preventative maintenance and recommended screening tests for an annual wellness exam were reviewed in full  today. Brought up to date unless services declined.  Counselled on the importance of diet, exercise, and its role in overall health and mortality. The patient's FH and SH was reviewed, including their home life, tobacco status, and drug and alcohol status.   Vaccines:   COVID x 3, uptodate with Td, shingrix Pap/DVE:  2022 [facility]. Mammo: Done in 2022 per GYN. Colon:  10/2010, repeat in 10 years. Smoking Status:none ETOH/ drug LOV:FIEP/PIRJ  HIV screen:   refused per pt done in past.  Problem List Items Addressed This Visit     Attention deficit disorder (ADD) without hyperactivity    Not interested in using stimulant medication.  She will talk with her counselor about behavioral treatment options.      Class 1 drug-induced obesity with serious comorbidity and body mass index (BMI) of 31.0 to 31.9 in adult    Stable, chronic.  Continue current medication.         Essential hypertension    Stable, chronic.  Continue current medication.   losartan hydrochlorothiazide 50/12.5 mg daily      Hyperlipidemia   Hypothyroidism    Chronic, inadequate control on current alternate thyroid medication.  She is not interested in continuing to see the alternative medicine doctor.  She will stop this medication and restart levothyroxine  25 mcg daily.  She will return for thyroid testing in 4 to 6 weeks.      Relevant Medications   levothyroxine (SYNTHROID) 25 MCG tablet   Other Relevant Orders   TSH   T4, free   T3, free   Moderate episode of recurrent major depressive disorder (HCC)     Moderate control, increased stress.  She will counselor to start back with therapy.      Moderate persistent asthma   OSA (obstructive sleep apnea)    She is currently working on weight loss as treatment.  She was previously intolerant to CPAP.  She is followed by pulmonary.      Prediabetes    She will get back on track with low-carb diet and increase activity.      Vitamin D  deficiency    Resolved      Other Visit Diagnoses     Routine general medical examination at a health care facility    -  Primary      Meds ordered this encounter  Medications   levothyroxine (SYNTHROID) 25 MCG tablet    Sig: Take 1 tablet (25 mcg total) by mouth daily.    Dispense:  30 tablet    Refill:  [place], MD

## 2021-12-15 NOTE — Assessment & Plan Note (Signed)
Stable, chronic.  Continue current medication.    

## 2021-12-15 NOTE — Assessment & Plan Note (Signed)
Resolved

## 2021-12-15 NOTE — Patient Instructions (Addendum)
Consider returning to see Jonelle Sports.  Get back on track with low cholesterol and low carb diet.  Stop NP thyroid  and start low dose levothyroxine.  Return lab only thyroid labs in 4-6 weeks.  Call GI to set up colon cancer screening.

## 2021-12-15 NOTE — Assessment & Plan Note (Signed)
She will get back on track with low-carb diet and increase activity.

## 2021-12-15 NOTE — Assessment & Plan Note (Addendum)
Not interested in using stimulant medication.  She will talk with her counselor about behavioral treatment options.

## 2021-12-15 NOTE — Assessment & Plan Note (Signed)
She is currently working on weight loss as treatment.  She was previously intolerant to CPAP.  She is followed by pulmonary.

## 2021-12-15 NOTE — Assessment & Plan Note (Signed)
Chronic, inadequate control on current alternate thyroid medication.  She is not interested in continuing to see the alternative medicine doctor.  She will stop this medication and restart levothyroxine 25 mcg daily.  She will return for thyroid testing in 4 to 6 weeks.

## 2021-12-15 NOTE — Assessment & Plan Note (Signed)
Moderate control, increased stress.  She will counselor to start back with therapy.

## 2022-01-04 ENCOUNTER — Ambulatory Visit (INDEPENDENT_AMBULATORY_CARE_PROVIDER_SITE_OTHER): Payer: BC Managed Care – PPO

## 2022-01-04 ENCOUNTER — Encounter: Payer: Self-pay | Admitting: Orthopedic Surgery

## 2022-01-04 ENCOUNTER — Ambulatory Visit: Payer: BC Managed Care – PPO | Admitting: Orthopedic Surgery

## 2022-01-04 VITALS — Ht 64.0 in | Wt 180.0 lb

## 2022-01-04 DIAGNOSIS — M25552 Pain in left hip: Secondary | ICD-10-CM | POA: Diagnosis not present

## 2022-01-04 DIAGNOSIS — M1711 Unilateral primary osteoarthritis, right knee: Secondary | ICD-10-CM

## 2022-01-04 MED ORDER — BUPIVACAINE HCL 0.25 % IJ SOLN
4.0000 mL | INTRAMUSCULAR | Status: AC | PRN
  Administered 2022-01-04: 4 mL via INTRA_ARTICULAR

## 2022-01-04 MED ORDER — METHYLPREDNISOLONE ACETATE 40 MG/ML IJ SUSP
40.0000 mg | INTRAMUSCULAR | Status: AC | PRN
  Administered 2022-01-04: 40 mg via INTRA_ARTICULAR

## 2022-01-04 MED ORDER — LIDOCAINE HCL 1 % IJ SOLN
5.0000 mL | INTRAMUSCULAR | Status: AC | PRN
  Administered 2022-01-04: 5 mL
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
## 2022-01-05 DIAGNOSIS — K912 Postsurgical malabsorption, not elsewhere classified: Secondary | ICD-10-CM | POA: Diagnosis not present

## 2022-01-05 DIAGNOSIS — Z903 Acquired absence of stomach [part of]: Secondary | ICD-10-CM | POA: Diagnosis not present

## 2022-01-12 ENCOUNTER — Other Ambulatory Visit (INDEPENDENT_AMBULATORY_CARE_PROVIDER_SITE_OTHER): Payer: BC Managed Care – PPO

## 2022-01-12 DIAGNOSIS — E039 Hypothyroidism, unspecified: Secondary | ICD-10-CM

## 2022-01-12 LAB — T4, FREE: Free T4: 0.67 ng/dL (ref 0.60–1.60)

## 2022-01-12 LAB — TSH: TSH: 4.04 u[IU]/mL (ref 0.35–5.50)

## 2022-01-12 LAB — T3, FREE: T3, Free: 3.3 pg/mL (ref 2.3–4.2)

## 2022-01-12 NOTE — Progress Notes (Signed)
No critical labs need to be addressed urgently. We will discuss labs in detail at upcoming office visit.   

## 2022-01-19 ENCOUNTER — Telehealth: Payer: Self-pay | Admitting: Orthopedic Surgery

## 2022-01-19 ENCOUNTER — Ambulatory Visit
Admission: RE | Admit: 2022-01-19 | Discharge: 2022-01-19 | Disposition: A | Discharge status: Home / Self Care | Payer: BC Managed Care – PPO | Source: Ambulatory Visit | Attending: Orthopedic Surgery | Admitting: Orthopedic Surgery

## 2022-01-19 DIAGNOSIS — M25552 Pain in left hip: Secondary | ICD-10-CM | POA: Diagnosis not present

## 2022-01-19 NOTE — Telephone Encounter (Signed)
Pt called requesting Dr Marlou Sa to requesting her MR. Pt states she had MRI on yesterday. Pt phone number is [phone].

## 2022-01-19 NOTE — Telephone Encounter (Signed)
I called hip ok she will come in am for lsp xrays

## 2022-01-20 ENCOUNTER — Ambulatory Visit (INDEPENDENT_AMBULATORY_CARE_PROVIDER_SITE_OTHER): Payer: BC Managed Care – PPO

## 2022-01-20 ENCOUNTER — Encounter: Payer: Self-pay | Admitting: Orthopedic Surgery

## 2022-01-20 ENCOUNTER — Ambulatory Visit: Payer: BC Managed Care – PPO | Admitting: Orthopedic Surgery

## 2022-01-20 ENCOUNTER — Ambulatory Visit: Payer: Self-pay

## 2022-01-20 ENCOUNTER — Ambulatory Visit (INDEPENDENT_AMBULATORY_CARE_PROVIDER_SITE_OTHER): Payer: BC Managed Care – PPO | Admitting: Orthopedic Surgery

## 2022-01-20 DIAGNOSIS — M25552 Pain in left hip: Secondary | ICD-10-CM

## 2022-01-20 DIAGNOSIS — M25551 Pain in right hip: Secondary | ICD-10-CM

## 2022-01-20 DIAGNOSIS — M79605 Pain in left leg: Secondary | ICD-10-CM | POA: Diagnosis not present

## 2022-01-20 DIAGNOSIS — S73192D Other sprain of left hip, subsequent encounter: Secondary | ICD-10-CM

## 2022-01-20 DIAGNOSIS — M79604 Pain in right leg: Secondary | ICD-10-CM

## 2022-01-20 NOTE — Progress Notes (Signed)
Office Visit Note   Patient: Jaime Chapman           Date of Birth: 1961/02/07           MRN: [id number] Visit Date: 01/20/2022 Requested by: Jinny Sanders, MD [place],  [place] [address] PCP: Jinny Sanders, MD  Subjective: Chief Complaint  Patient presents with   Other    Lumbar xray and hip MRI review    HPI: Jaime Chapman is a 61 y.o. female who presents to the office reporting low back and left hip pain.  Since she was last seen she had an MRI scan of the left hip which shows superior labral degeneration with small tear.  That is reviewed with the patient and it is fairly underwhelming.  Mild tendinosis of the gluteus minimus and hamstring origin.  No effusion and no significant arthritis present.  Patient also reports left-sided back pain radiating down the leg.  She does get sharp pain in the groin and radiating around to the buttock on her daily basis.  Symptoms ongoing now for many months..                ROS: All systems reviewed are negative as they relate to the chief complaint within the history of present illness.  Patient denies fevers or chills.  Assessment & Plan: Visit Diagnoses:  1. Pain in left hip   2. Pain in right hip   3. Bilateral leg pain     Plan: Impression is low back and left hip pain.  MRI scan is underwhelming for anything outside of relatively age-appropriate degenerative labral pathology.  Plan for diagnostic and therapeutic left hip injection today.  Also would like to get MRI of the lumbar spine to evaluate for left-sided radiculopathy which could be present based on some of the symptoms present in the SI region and buttock region extending distally.  Follow-up after her MRI scan where we can see how she did with the injection as well.  Follow-Up Instructions: No follow-ups on file.   Orders:  Orders Placed This Encounter  Procedures   XR Lumbar Spine 2-3 Views   US Guided Needle Placement - No Linked Charges   MR  Lumbar Spine w/o contrast   No orders of the defined types were placed in this encounter.     Procedures: Large Joint Inj: L hip joint on 01/20/2022 12:32 PM Indications: pain and diagnostic evaluation Details: 18 G 3.5 in needle, ultrasound-guided lateral approach  Arthrogram: No  Medications: 5 mL lidocaine 1 %; 80 mg methylPREDNISolone acetate 80 MG/ML; 4 mL bupivacaine 0.25 % Outcome: tolerated well, no immediate complications Procedure, treatment alternatives, risks and benefits explained, specific risks discussed. Consent was given by the patient. Immediately prior to procedure a time out was called to verify the correct patient, procedure, equipment, support staff and site/side marked as required. Patient was prepped and draped in the usual sterile fashion.       Clinical Data: No additional findings.  Objective: Vital Signs: There were no vitals taken for this visit.  Physical Exam:  Constitutional: Patient appears well-developed HEENT:  Head: Normocephalic Eyes:EOM are normal Neck: Normal range of motion Cardiovascular: Normal rate Pulmonary/chest: Effort normal Neurologic: Patient is alert Skin: Skin is warm Psychiatric: Patient has normal mood and affect  Ortho Exam: Ortho exam demonstrates normal gait and alignment.  Not too much groin pain with internal/external rotation of either leg.  No nerve root tension  signs.  Patient has 5 out of 5 ankle dorsiflexion plantarflexion quad and hamstring strength with symmetric reflexes 1 to 2+ out of 4 bilateral patella and Achilles.  Pedal pulses palpable.  No definite paresthesias L1-S1 bilaterally.  No trochanteric tenderness bilaterally.  Very good hip flexion strength abduction and adduction strength bilaterally.  Specialty Comments:  No specialty comments available.  Imaging: XR Lumbar Spine 2-3 Views  Result Date: 01/20/2022 AP lateral radiographs lumbar spine reviewed.  No spondylolisthesis or compression  fractures.  Bony alignment maintained.  Minimal degenerative disc disease between the vertebral bodies and only mild facet arthritis in the lower lumbar spine region.  SI joints appear intact  US Guided Needle Placement - No Linked Charges  Result Date: 01/20/2022 Ultrasound imaging demonstrates needle placement into the left hip joint with extravasation of fluid and no complicating features    PMFS History: Patient Active Problem List   Diagnosis Date Noted   Moderate episode of recurrent major depressive disorder ([place]) 06/03/2020   Attention deficit disorder (ADD) without hyperactivity 03/11/2020   Class 1 drug-induced obesity with serious comorbidity and body mass index (BMI) of 31.0 to 31.9 in adult 09/14/2019   OSA (obstructive sleep apnea) 05/10/2017   Pre-syncope 03/10/2017   Abnormal CT of the chest 08/31/2016   Panic attacks 04/09/2016   Essential hypertension 07/26/2014   Lyme disease 12/13/2013   Constipation - functional 11/20/2013   Hypothyroidism 11/16/2013   H/O ventral hernia repair 05/01/2012   Pulmonary nodules 01/26/2012   Prediabetes 03/04/2011   Fatty liver disease, nonalcoholic 82/99/3716   Generalized anxiety disorder 10/21/2010   VENTRAL HERNIA-twice recurrent 09/08/2009   Vitamin D deficiency 12/27/2008   Hyperlipidemia 05/22/2007   Moderate persistent asthma 05/22/2007   DEGENERATIVE DISC DISEASE 05/22/2007   Fibromyalgia 05/22/2007   Allergic rhinitis 04/14/2007   GERD 04/14/2007   Past Medical History:  Diagnosis Date   Allergic rhinitis    Anxiety    Asthma    DDD (degenerative disc disease)    also has spinal arthritis   Depression    Diabetes mellitus    borderline   Dyslipidemia    Fibromyalgia    GERD (gastroesophageal reflux disease)    Hypertension    Lung nodules    RIGHT LUNG--LAST EVALUATED BY CHEST CT 02/01/12 - STABLE AND FOLLOW UP PLANNED IN ONE YEAR.   Lyme disease    Obesity    OSA (obstructive sleep apnea) 05/10/2017    Sinusitis    Stress disorder, acute    Vitamin D deficiency     Family History  Problem Relation Age of Onset   Diabetes Mother    Heart disease Mother 14   Arthritis Mother    Depression Mother    Hearing loss Mother    Hypertension Mother    Obesity Mother    Lung cancer Maternal Grandmother    Cancer Maternal Grandmother        lung   Colon cancer Neg Hx     Past Surgical History:  Procedure Laterality Date   CESAREAN SECTION  1982, 1985   x 2    ENDOMETRIAL ABLATION  2009   HERNIA REPAIR  04/14/12   ventral hernia repair   Laparoscopic ventral hernia repair w/ mesh  08/2009   Dr. Grandville Silos, [facility]   LAPAROSCOPY  2002   TUBAL LIGATION     VENTRAL HERNIA REPAIR  09/2006   Dr. Dorette Grate HERNIA REPAIR N/A 04/14/2012   Procedure: Laparoscopic Repair of  Ventral Hernia;  Surgeon: Pedro Earls, MD;  Location: [facility];  Service: General;  Laterality: N/A;  Repair of Recurrent Ventral Hernia   Social History   Occupational History   Occupation: Corporate treasurer  Tobacco Use   Smoking status: Former    Types: Cigarettes    Quit date: 03/01/2004    Years since quitting: 17.9    Passive exposure: Past   Smokeless tobacco: Never  Vaping Use   Vaping Use: Never used  Substance and Sexual Activity   Alcohol use: No    Alcohol/week: 1.0 standard drink of alcohol    Types: 1 Standard drinks or equivalent per week   Drug use: No    Comment: remote majijuana   Sexual activity: Yes    Partners: Male    Birth control/protection: Post-menopausal

## 2022-01-23 ENCOUNTER — Encounter: Payer: Self-pay | Admitting: Orthopedic Surgery

## 2022-01-23 MED ORDER — BUPIVACAINE HCL 0.25 % IJ SOLN
4.0000 mL | INTRAMUSCULAR | Status: AC | PRN
  Administered 2022-01-20: 4 mL via INTRA_ARTICULAR

## 2022-01-23 MED ORDER — METHYLPREDNISOLONE ACETATE 80 MG/ML IJ SUSP
80.0000 mg | INTRAMUSCULAR | Status: AC | PRN
  Administered 2022-01-20: 80 mg via INTRA_ARTICULAR

## 2022-01-23 MED ORDER — LIDOCAINE HCL 1 % IJ SOLN
5.0000 mL | INTRAMUSCULAR | Status: AC | PRN
  Administered 2022-01-20: 5 mL

## 2022-02-10 DIAGNOSIS — Z1231 Encounter for screening mammogram for malignant neoplasm of breast: Secondary | ICD-10-CM | POA: Diagnosis not present

## 2022-02-10 DIAGNOSIS — Z01419 Encounter for gynecological examination (general) (routine) without abnormal findings: Secondary | ICD-10-CM | POA: Diagnosis not present

## 2022-02-10 DIAGNOSIS — R32 Unspecified urinary incontinence: Secondary | ICD-10-CM | POA: Diagnosis not present

## 2022-02-10 DIAGNOSIS — Z6831 Body mass index (BMI) 31.0-31.9, adult: Secondary | ICD-10-CM | POA: Diagnosis not present

## 2022-02-14 ENCOUNTER — Ambulatory Visit
Admission: RE | Admit: 2022-02-14 | Discharge: 2022-02-14 | Disposition: A | Discharge status: Home / Self Care | Payer: BC Managed Care – PPO | Source: Ambulatory Visit | Attending: Orthopedic Surgery | Admitting: Orthopedic Surgery

## 2022-02-14 DIAGNOSIS — M79604 Pain in right leg: Secondary | ICD-10-CM

## 2022-02-14 DIAGNOSIS — M545 Low back pain, unspecified: Secondary | ICD-10-CM | POA: Diagnosis not present

## 2022-02-25 ENCOUNTER — Ambulatory Visit (INDEPENDENT_AMBULATORY_CARE_PROVIDER_SITE_OTHER): Payer: BC Managed Care – PPO | Admitting: Surgical

## 2022-02-25 ENCOUNTER — Encounter: Payer: Self-pay | Admitting: Surgical

## 2022-02-25 DIAGNOSIS — M25552 Pain in left hip: Secondary | ICD-10-CM

## 2022-02-25 NOTE — Progress Notes (Signed)
Office Visit Note   Patient: Jaime Chapman           Date of Birth: 11-Aug-1960           MRN: [id number] Visit Date: 02/25/2022 Requested by: Jinny Sanders, MD [place],  [place] [id number] PCP: Jinny Sanders, MD  Subjective: Chief Complaint  Patient presents with   Other     Scan review    HPI: Jaime Chapman is a 61 y.o. female who presents to the office for MRI review. Patient denies any changes in symptoms.  Continues to complain mainly of anterior lateral left hip pain with some groin pain.  Does not really have a lot of buttock pain or radicular pain down the left leg.  Does get occasional pain into the lumbar spine and left-sided SI joint region.  She had previous ultrasound-guided intra-articular hip injection with Dr. Marlou Sa at her last appointment that gave her 70% relief for 3 days before her pain returned.  MRI results revealed: MR Lumbar Spine w/o contrast  Result Date: 02/15/2022 CLINICAL DATA:  Lower back pain radiating into the groin at times, worse on the left EXAM: MRI LUMBAR SPINE WITHOUT CONTRAST TECHNIQUE: Multiplanar, multisequence MR imaging of the lumbar spine was performed. No intravenous contrast was administered. COMPARISON:  Report from 09/03/2010 FINDINGS: Segmentation:  Standard. Alignment:  Borderline L5-S1 anterolisthesis. Vertebrae:  No fracture, evidence of discitis, or bone lesion. Conus medullaris and cauda equina: Conus extends to the L2 level. Conus and cauda equina appear normal. Paraspinal and other soft tissues: Negative for perispinal mass or inflammation. Suspect cholelithiasis partially covered on axial T2 weighted images. Disc levels: Degenerative facet spurring at L3-4 and below with moderate hypertrophic change on the left at L4-5 and right at L5-S1. Diffusely preserved disc height and hydration. No neural compression. 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 No neural impingement to explain leg symptoms.  Suspect partially covered cholelithiasis. Electronically Signed   By: Jorje Guild M.D.   On: 02/15/2022 19:23                 ROS: All systems reviewed are negative as they relate to the chief complaint within the history of present illness.  Patient denies fevers or chills.  Assessment & Plan: Visit Diagnoses:  1. Pain in left hip     Plan: Jaime Chapman is a 61 y.o. female who presents to the office for review of MRI lumbar spine.  MRI demonstrates no significant compressive pathology in the lumbar spine.  She does have some facet joint arthritis at multiple levels that may cause some axial lumbar spine pain but does not really look like there is anything that would be causing significant hip pain referred from the lumbar spine.  She did have great relief of her hip pain for 3 days from left hip injection that Dr. Marlou Sa administered with ultrasound guidance, getting 70% relief.  Plan to try physical therapy upstairs for her left hip and return in 2 months with consideration of repeat injection at that time.    Follow-Up Instructions: No follow-ups on file.   Orders:  Orders Placed This Encounter  Procedures   Ambulatory referral to Physical Therapy   No orders of the defined types were placed in this encounter.     Procedures: No procedures performed   Clinical Data: No additional findings.  Objective: Vital Signs: There were no vitals taken for this visit.  Physical Exam:  Constitutional: Patient appears well-developed HEENT:  Head: Normocephalic Eyes:EOM are normal Neck: Normal range of motion Cardiovascular: Normal rate Pulmonary/chest: Effort normal Neurologic: Patient is alert Skin: Skin is warm Psychiatric: Patient has normal mood and affect  Ortho Exam: Ortho exam demonstrates left hip with increased pain with FADIR sign.  Positive Stinchfield sign.  No tenderness over the greater trochanter.  Negative FABER sign.  Intact hip flexion.  Intact quad and  hamstring strength.  She has minimal tenderness throughout the axial lumbar spine.  Does have some mild to moderate tenderness over the left SI joint.  Specialty Comments:  No specialty comments available.  Imaging: No results found.   PMFS History: Patient Active Problem List   Diagnosis Date Noted   Moderate episode of recurrent major depressive disorder ([place]) 06/03/2020   Attention deficit disorder (ADD) without hyperactivity 03/11/2020   Class 1 drug-induced obesity with serious comorbidity and body mass index (BMI) of 31.0 to 31.9 in adult 09/14/2019   OSA (obstructive sleep apnea) 05/10/2017   Pre-syncope 03/10/2017   Abnormal CT of the chest 08/31/2016   Panic attacks 04/09/2016   Essential hypertension 07/26/2014   Lyme disease 12/13/2013   Constipation - functional 11/20/2013   Hypothyroidism 11/16/2013   H/O ventral hernia repair 05/01/2012   Pulmonary nodules 01/26/2012   Prediabetes 03/04/2011   Fatty liver disease, nonalcoholic 74/01/8785   Generalized anxiety disorder 10/21/2010   VENTRAL HERNIA-twice recurrent 09/08/2009   Vitamin D deficiency 12/27/2008   Hyperlipidemia 05/22/2007   Moderate persistent asthma 05/22/2007   DEGENERATIVE DISC DISEASE 05/22/2007   Fibromyalgia 05/22/2007   Allergic rhinitis 04/14/2007   GERD 04/14/2007   Past Medical History:  Diagnosis Date   Allergic rhinitis    Anxiety    Asthma    DDD (degenerative disc disease)    also has spinal arthritis   Depression    Diabetes mellitus    borderline   Dyslipidemia    Fibromyalgia    GERD (gastroesophageal reflux disease)    Hypertension    Lung nodules    RIGHT LUNG--LAST EVALUATED BY CHEST CT 02/01/12 - STABLE AND FOLLOW UP PLANNED IN ONE YEAR.   Lyme disease    Obesity    OSA (obstructive sleep apnea) 05/10/2017   Sinusitis    Stress disorder, acute    Vitamin D deficiency     Family History  Problem Relation Age of Onset   Diabetes Mother    Heart disease Mother 54    Arthritis Mother    Depression Mother    Hearing loss Mother    Hypertension Mother    Obesity Mother    Lung cancer Maternal Grandmother    Cancer Maternal Grandmother        lung   Colon cancer Neg Hx     Past Surgical History:  Procedure Laterality Date   CESAREAN SECTION  1982, 1985   x 2    ENDOMETRIAL ABLATION  2009   HERNIA REPAIR  04/14/12   ventral hernia repair   Laparoscopic ventral hernia repair w/ mesh  08/2009   Dr. Grandville Silos, [facility]   LAPAROSCOPY  2002   TUBAL LIGATION     VENTRAL HERNIA REPAIR  09/2006   Dr. Grandville Silos   VENTRAL HERNIA REPAIR N/A 04/14/2012   Procedure: Laparoscopic Repair of  Ventral Hernia;  Surgeon: Pedro Earls, MD;  Location: [facility];  Service: General;  Laterality: N/A;  Repair of Recurrent Ventral Hernia   Social History   Occupational History  Occupation: Corporate treasurer  Tobacco Use   Smoking status: Former    Types: Cigarettes    Quit date: 03/01/2004    Years since quitting: 18.0    Passive exposure: Past   Smokeless tobacco: Never  Vaping Use   Vaping Use: Never used  Substance and Sexual Activity   Alcohol use: No    Alcohol/week: 1.0 standard drink of alcohol    Types: 1 Standard drinks or equivalent per week   Drug use: No    Comment: remote majijuana   Sexual activity: Yes    Partners: Male    Birth control/protection: Post-menopausal

## 2022-03-02 ENCOUNTER — Telehealth: Payer: Self-pay | Admitting: Family Medicine

## 2022-03-02 NOTE — Telephone Encounter (Signed)
Will evaluate in office. Will await Dr. Arley Phenix advice

## 2022-03-02 NOTE — Telephone Encounter (Signed)
Pt states her BP is high & she is currently taking,  losartan-hydrochlorothiazide (HYZAAR) 50-12.5 MG tablet. Pt stated earlier today, she took some of the meds & her bp seemed to get higher, ranging at 179/105. Pt states she has a headache & is worried her BP will steady rise. Pt is asking is it fine take a extra dosage? Pt is asking to speak to a nurse, transferring to Access Nurse. Call back # [phone].

## 2022-03-02 NOTE — Telephone Encounter (Addendum)
[name] RN with Access nurse calling; about 20 mins ago BP 179/105 P 63. Pt took Losartan-HCTZ 50 - 12.5 mg 03/02/21 about 8:30 AM. Pt has not missed taking BP med.pt has taken tylenol for H/A but H/A continues to worsen; now pain level is 6.pt said she does feel pressure behind eyes and in temples. No CP,SOB or dizziness. Pt scheduled appt with Romilda Garret NP on 03/03/22 at 9 AM with UC & ED precautions that pt voiced understanding. Pt also wants note sent to Dr Diona Browner to see if pt can take extra dose of Losartan HCTZ 50 - 12.5 mg later today. Pt request cb when reviewed by Dr Diona Browner. Sending note to Dr Bascom Levels pool, Romilda Garret NP and will teams Vita Barley who is working with Dr Diona Browner. Pt can be reached at [phone] and pt has losartanHCTZ 50 - 12.5 mg. [facility] [address]   [facility] - Client TELEPHONE ADVICE RECORD [facility] Patient Name: Jaime Chapman Gender: Female DOB: 1960-09-22 Age: 62 Y 82 M 2 D Return Phone Number: [phone] (Primary), [phone] (Secondary) Address: City/ State/ Zip: [place]  [address] Client [facility] - Client Client Site [facility] - Day Provider Eliezer Lofts - MD Contact Type Call Who Is Calling Patient / Member / Family / Caregiver Call Type Triage / Clinical Relationship To Patient Self Return Phone Number [phone] (Primary) Chief Complaint Headache Reason for Call Symptomatic / Request for [place] states she has blood pressure of 179/105 and she is on Losartan and her blood pressure is getting higher and she wants to know if she can take another dose of medication. She also has a headache. Translation No Nurse Assessment Nurse: Gildardo Pounds, RN, [name] Date/Time (Eastern Time): 03/02/2022 2:29:50 PM Confirm and document reason for call. If symptomatic, describe symptoms. ---Caller states her blood pressure is 179/105.  It was 170/104 2 hours ago. She is on Losartan '50mg'$  QD. She took it at [facility]. She had a HA before going to a dental appt. She took 2 Tylenol when she got home but her HA is getting worse. Pressure in eardrums. Her blood pressure keeps getting higher, and she wants to know if she can take another dose of medication. Does the patient have any new or worsening symptoms? ---Yes Will a triage be completed? ---Yes Related visit to physician within the last 2 weeks? ---No Does the PT have any chronic conditions? (i.e. diabetes, asthma, this includes High risk factors for pregnancy, etc.) ---Yes List chronic conditions. ---HTN, Fibromyalgia, Lyme dz, OA, asthma Is this a behavioral health or substance abuse call? ---No Guidelines Guideline Title Affirmed Question Affirmed Notes Nurse Date/Time (Eastern Time) Blood Pressure - High Systolic BP >= 557 OR Diastolic >= 322 [place], RN, [name] 03/02/2022 2:32:44 PM PLEASE NOTE: All timestamps contained within this report are represented as [place] Standard Time. CONFIDENTIALTY NOTICE: This fax transmission is intended only for the addressee. It contains information that is legally privileged, confidential or otherwise protected from use or disclosure. If you are not the intended recipient, you are strictly prohibited from reviewing, disclosing, copying using or disseminating any of this information or taking any action in reliance on or regarding this information. If you have received this fax in error, please notify us immediately by telephone so that we can arrange for its return to [place]. Phone: [phone], Toll-Free: [phone], Fax: [phone] Page: 2 of 2 Call Id: [id number] [place]. Time (  [place] Time) Disposition Final User 03/02/2022 2:38:05 PM SEE PCP WITHIN 3 DAYS Yes Lovelace, RN, [name] Final Disposition 03/02/2022 2:38:05 PM SEE PCP WITHIN 3 DAYS Yes Lovelace, RN, [name] Caller Disagree/Comply Comply Caller Understands Yes PreDisposition  InappropriateToAsk Care Advice Given Per Guideline SEE PCP WITHIN 3 DAYS: * You need to be seen within 2 or 3 days. * PCP VISIT: Call your doctor (or NP/PA) during regular office hours and make an appointment. A clinic or urgent care center are good places to go for care if your doctor's office is closed or you can't get an appointment. NOTE: If office will be open tomorrow, tell caller to call then, not in 3 days. CALL BACK IF: * You become worse CARE ADVICE given per High Blood Pressure (Adult) guideline

## 2022-03-02 NOTE — Telephone Encounter (Signed)
Please call ASAP   She can take an additional dose of losartan HCTZ 50/12.5 mg today and reassess BP tommorow.

## 2022-03-03 ENCOUNTER — Ambulatory Visit: Payer: BC Managed Care – PPO | Admitting: Nurse Practitioner

## 2022-03-03 ENCOUNTER — Encounter: Payer: Self-pay | Admitting: Nurse Practitioner

## 2022-03-03 VITALS — BP 142/82 | HR 93 | Temp 97.9°F | Resp 16 | Ht 64.0 in | Wt 185.1 lb

## 2022-03-03 DIAGNOSIS — I10 Essential (primary) hypertension: Secondary | ICD-10-CM | POA: Diagnosis not present

## 2022-03-03 MED ORDER — HYDROCHLOROTHIAZIDE 12.5 MG PO TABS: 12.5000 mg | ORAL_TABLET | Freq: Every day | ORAL | 0 refills | Status: DC

## 2022-03-03 NOTE — Assessment & Plan Note (Signed)
Patient had elevated blood pressures at home even while taking losartan HCTZ.  Blood pressure elevated x 2 in office today.  Will increase the HCTZ from 12.5 to 25 mg.  Patient continue taking the losartan-HCTZ 50-12.5 mg.  Will send in hydrochlorothiazide 12.5 mg tablet that she will take in conjunction with her losartan HCTZ tablet.  Have her follow-up in 2 weeks with primary care provider

## 2022-03-03 NOTE — Patient Instructions (Signed)
Nice to see you today I added on some extra hydrochlorothiazide.  Follow up with Dr. Diona Browner in 2 weeks for a recheck

## 2022-03-03 NOTE — Progress Notes (Signed)
Acute Office Visit  Subjective:     Patient ID: Jaime Chapman, female    DOB: [birth year], 61 y.o.   MRN: [id number]  Chief Complaint  Patient presents with   Hypertension    Been high since yesterday This am 156/94     Patient is in today for HTN  Patient called into office and stated that her blood pressure was elevated. She checked it at home and it was 179/105. She was experiencing a headache. She was instructed to take an additional losartan hctz 50-12.5. Patient states that the headache was still there but faint. States that she checked her blood prior to bed and it was 157/120 Has taken her bp pill this morning. She did check her blood pressure this morning at home and it was 156/94. She does have an upper arm cuff at home  States that she has a little headache but improved since yesterday  States that her eyes, ears, and teeth were hurting yesterday. Some sinus stuff today. Has not taken any medications for sinus/cold. Has recently started on Veozah for VMS. State that she is currently not taking the estrogen or progesterone while on the Veozah     Review of Systems  Constitutional:  Negative for chills and fever.  Eyes:  Positive for blurred vision (over the past week). Negative for double vision.  Respiratory:  Negative for shortness of breath.   Cardiovascular:  Negative for chest pain.  Neurological:  Positive for headaches. Negative for dizziness, tingling and weakness.        Objective:    BP (!) 142/82   Pulse 93   Temp 97.9 F (36.6 C)   Resp 16   Ht '5\' 4"'$  (1.626 m)   Wt 185 lb 2 oz (84 kg)   SpO2 98%   BMI 31.78 kg/m  BP Readings from Last 3 Encounters:  03/03/22 (!) 142/82  12/15/21 116/60  10/23/21 130/70   Wt Readings from Last 3 Encounters:  03/03/22 185 lb 2 oz (84 kg)  01/04/22 180 lb (81.6 kg)  12/15/21 180 lb 8 oz (81.9 kg)      Physical Exam Vitals and nursing note reviewed.  Constitutional:      Appearance: Normal appearance.   HENT:     Right Ear: Tympanic membrane, ear canal and external ear normal.     Left Ear: Tympanic membrane, ear canal and external ear normal.     Mouth/Throat:     Mouth: Mucous membranes are moist.     Pharynx: Oropharynx is clear.  Eyes:     Extraocular Movements: Extraocular movements intact.     Pupils: Pupils are equal, round, and reactive to light.     Comments: Wears glasses  Cardiovascular:     Rate and Rhythm: Normal rate and regular rhythm.     Heart sounds: Normal heart sounds.  Pulmonary:     Breath sounds: Normal breath sounds.  Musculoskeletal:     Right lower leg: No edema.     Left lower leg: No edema.  Lymphadenopathy:     Cervical: No cervical adenopathy.  Skin:    General: Skin is warm.  Neurological:     General: No focal deficit present.     Mental Status: She is alert.     Deep Tendon Reflexes:     Reflex Scores:      Bicep reflexes are 2+ on the right side and 2+ on the left side.      Patellar reflexes  are 2+ on the right side and 2+ on the left side.    Comments: Bilateral upper and lower extremity strength 5/5     No results found for any visits on 03/03/22.      Assessment & Plan:   Problem List Items Addressed This Visit       Cardiovascular and Mediastinum   Essential hypertension - Primary    Patient had elevated blood pressures at home even while taking losartan HCTZ.  Blood pressure elevated x 2 in office today.  Will increase the HCTZ from 12.5 to 25 mg.  Patient continue taking the losartan-HCTZ 50-12.5 mg.  Will send in hydrochlorothiazide 12.5 mg tablet that she will take in conjunction with her losartan HCTZ tablet.  Have her follow-up in 2 weeks with primary care provider      Relevant Medications   hydrochlorothiazide (HYDRODIURIL) 12.5 MG tablet    Meds ordered this encounter  Medications   hydrochlorothiazide (HYDRODIURIL) 12.5 MG tablet    Sig: Take 1 tablet (12.5 mg total) by mouth daily.    Dispense:  30 tablet     Refill:  0    Order Specific Question:   Supervising Provider    Answer:   Loura Pardon A [[id number]]    Return in about 2 weeks (around 03/17/2022) for With Dr. Diona Browner for BP recheck .  Romilda Garret, NP

## 2022-03-04 NOTE — Telephone Encounter (Signed)
Pt seen [employer] on 03/03/2022
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
## 2022-03-16 ENCOUNTER — Ambulatory Visit: Payer: BC Managed Care – PPO | Attending: Surgical | Admitting: Physical Therapy

## 2022-03-16 DIAGNOSIS — M25552 Pain in left hip: Secondary | ICD-10-CM | POA: Diagnosis not present

## 2022-03-16 DIAGNOSIS — M25652 Stiffness of left hip, not elsewhere classified: Secondary | ICD-10-CM | POA: Insufficient documentation

## 2022-03-16 DIAGNOSIS — M6281 Muscle weakness (generalized): Secondary | ICD-10-CM | POA: Diagnosis not present

## 2022-03-16 NOTE — Patient Instructions (Signed)
Aquatic Therapy at [facility]-  What to Expect! ? ?Where:  ? ?[facility] ?[address] [facility] ?[place], [place] [address] ?Rehab phone [phone] ? ?NOTE:  You will receive an automated phone message reminding you of your appt and it will say the appointment is at the [address] [facility] [address] [facility] clinic. ? ?        ?How to Prepare: ?Please make sure you drink 8 ounces of water about one hour prior to your pool session ?A caregiver may attend if needed with the patient to help assist as needed. A caregiver can sit in the pool room on chair. ?Please arrive IN YOUR SUIT and 15 minutes prior to your appointment - this helps to avoid delays in starting your session. ?Please make sure to attend to any toileting needs prior to entering the pool ?Locker rooms for changing are provided.   There is direct access to the pool deck form the locker room.  You can lock your belongings in a locker with lock provided. ?Once on the pool deck your therapist will ask if you have signed the Patient  Consent and Assignment of Benefits form before beginning treatment ?Your therapist may take your blood pressure prior to, during and after your session if indicated ?We usually try and create a home exercise program based on activities we do in the pool.  Please be thinking about who might be able to assist you in the pool should you need to participate in an aquatic home exercise program at the time of discharge if you need assistance.  Some patients do not want to or do not have the ability to participate in an aquatic home program - this is not a barrier in any way to you participating in aquatic therapy as part of your current therapy plan! ?After Discharge from PT, you can continue using home program at  the [facility]/, there is a drop-in fee for $5 ($45 a month)or for 60 years  or older $4.00 ($40 a month for seniors ) or any local [facility] pool.  Memberships for purchase are  available for gym/pool at [facility] ? ?IT IS VERY IMPORTANT THAT YOUR LAST VISIT BE IN THE CLINIC AT [address] AFTER YOUR LAST AQUATIC VISIT.  PLEASE MAKE SURE THAT YOU HAVE A [address]  APPOINTMENT SCHEDULED. ? ? ?About the pool: ?Pool is located approximately 500 FT from the entrance of the building.  ?Please bring a support person if you need assistance traveling this      distance.  ? ?Your therapist will assist you in entering the water; there are two ways to     ?      enter: stairs with railings, and a mechanical lift. Your therapist will determine the most appropriate way for you. ? ?Water temperature is usually between 88-90 degrees ? ?There may be up to 2 other swimmers in the pool at the same time ? ?The pool deck is tile, please wear shoes with good traction if you prefer not to be barefoot.  ? ? ?Contact Info:  ?For appointment scheduling and cancellations:         Please call the [facility]  PH:[phone]              Aquatic Therapy  ?[facility]       All sessions are 45 minutes ? ? ? ? ?        ? ?                ?                      ?

## 2022-03-17 ENCOUNTER — Encounter: Payer: Self-pay | Admitting: Family Medicine

## 2022-03-17 ENCOUNTER — Ambulatory Visit (INDEPENDENT_AMBULATORY_CARE_PROVIDER_SITE_OTHER): Payer: BC Managed Care – PPO | Admitting: Family Medicine

## 2022-03-17 VITALS — BP 130/70 | HR 75 | Temp 97.2°F | Resp 16 | Ht 64.0 in | Wt 187.2 lb

## 2022-03-17 DIAGNOSIS — I10 Essential (primary) hypertension: Secondary | ICD-10-CM

## 2022-03-17 NOTE — Patient Instructions (Signed)
Continue current medication at current dose.  Call for further evaluation if recurrent side effects.

## 2022-03-17 NOTE — Progress Notes (Signed)
Patient ID: Jaime Chapman, female    DOB: 10-24-1960, 62 y.o.   MRN: [id number]  This visit was conducted in person.  BP 130/70   Pulse 75   Temp (!) 97.2 F (36.2 C)   Resp 16   Ht '5\' 4"'$  (1.626 m)   Wt 187 lb 4 oz (84.9 kg)   SpO2 98%   BMI 32.14 kg/m    CC:  Chief Complaint  Patient presents with   Hypertension    Subjective:   HPI: Jaime Chapman is a 62 y.o. female presenting on 03/17/2022 for Hypertension  Hypertension: levated blood pressure.  She was set up with an office visit with [facility] on March 03, 2022.  This office visit reviewed. Blood pressure remained elevated despite losartan hydrochlorothiazide 50/12.5 mg daily.  He additionally added hydrochlorothiazide 12.5 mg daily to take in conjunction with the losartan hydrochlorothiazide. NO SE BP Readings from Last 3 Encounters:  03/17/22 130/70  03/03/22 (!) 142/82  12/15/21 116/60  Using medication without problems or lightheadedness: none Chest pain with exertion: none Edema:none Short of breath: none Average home BPs: 1120-130/60-70 Other issues: No headache, occ blurred vision, no new neuro changes.  Had an episode last week of 1 minute: Felt like room spinning.  Occ blurry vision at times.  Balance not great but has always been liek that.   Mother with meniere's     Relevant past medical, surgical, family and social history reviewed and updated as indicated. Interim medical history since our last visit reviewed. Allergies and medications reviewed and updated. Outpatient Medications Prior to Visit  Medication Sig Dispense Refill   albuterol (VENTOLIN HFA) 108 (90 Base) MCG/ACT inhaler INHALE 2 PUFFS INTO THE LUNGS EVERY 4 HOURS AS NEEDED FOR WHEEIZNG 18 g 3   Fezolinetant (VEOZAH) 45 MG TABS Take 45 mg by mouth daily.     hydrochlorothiazide (HYDRODIURIL) 12.5 MG tablet Take 1 tablet (12.5 mg total) by mouth daily. 30 tablet 0   levothyroxine (SYNTHROID) 25 MCG tablet Take 1 tablet (25  mcg total) by mouth daily. 30 tablet 11   losartan-hydrochlorothiazide (HYZAAR) 50-12.5 MG tablet Take 1 tablet by mouth daily. 90 tablet 3   estradiol (VIVELLE-DOT) 0.1 MG/24HR patch Place 1 patch onto the skin 2 (two) times a week.     progesterone (PROMETRIUM) 200 MG capsule Take 200 mg by mouth daily.     No facility-administered medications prior to visit.     Per HPI unless specifically indicated in ROS section below Review of Systems Objective:  BP 130/70   Pulse 75   Temp (!) 97.2 F (36.2 C)   Resp 16   Ht '5\' 4"'$  (1.626 m)   Wt 187 lb 4 oz (84.9 kg)   SpO2 98%   BMI 32.14 kg/m   Wt Readings from Last 3 Encounters:  03/17/22 187 lb 4 oz (84.9 kg)  03/03/22 185 lb 2 oz (84 kg)  01/04/22 180 lb (81.6 kg)      Physical Exam Constitutional:      General: She is not in acute distress.    Appearance: Normal appearance. She is well-developed. She is not ill-appearing or toxic-appearing.  HENT:     Head: Normocephalic.     Right Ear: Hearing, tympanic membrane, ear canal and external ear normal. Tympanic membrane is not erythematous, retracted or bulging.     Left Ear: Hearing, tympanic membrane, ear canal and external ear normal. Tympanic membrane is not erythematous, retracted or  bulging.     Nose: No mucosal edema or rhinorrhea.     Right Sinus: No maxillary sinus tenderness or frontal sinus tenderness.     Left Sinus: No maxillary sinus tenderness or frontal sinus tenderness.     Mouth/Throat:     Pharynx: Uvula midline.  Eyes:     General: Lids are normal. Lids are everted, no foreign bodies appreciated.     Conjunctiva/sclera: Conjunctivae normal.     Pupils: Pupils are equal, round, and reactive to light.  Neck:     Thyroid: No thyroid mass or thyromegaly.     Vascular: No carotid bruit.     Trachea: Trachea normal.  Cardiovascular:     Rate and Rhythm: Normal rate and regular rhythm.     Pulses: Normal pulses.     Heart sounds: Normal heart sounds, S1 normal  and S2 normal. No murmur heard.    No friction rub. No gallop.  Pulmonary:     Effort: Pulmonary effort is normal. No tachypnea or respiratory distress.     Breath sounds: Normal breath sounds. No decreased breath sounds, wheezing, rhonchi or rales.  Abdominal:     General: Bowel sounds are normal.     Palpations: Abdomen is soft.     Tenderness: There is no abdominal tenderness.  Musculoskeletal:     Cervical back: Normal range of motion and neck supple.  Skin:    General: Skin is warm and dry.     Findings: No rash.  Neurological:     Mental Status: She is alert and oriented to person, place, and time.     GCS: GCS eye subscore is 4. GCS verbal subscore is 5. GCS motor subscore is 6.     Cranial Nerves: No cranial nerve deficit.     Sensory: No sensory deficit.     Motor: No abnormal muscle tone.     Coordination: Coordination normal.     Gait: Gait normal.     Deep Tendon Reflexes: Reflexes are normal and symmetric.     Comments: Nml cerebellar exam  Although slightly wobbly with eyes closed standing No papilledema  Psychiatric:        Mood and Affect: Mood is not anxious or depressed.        Speech: Speech normal.        Behavior: Behavior normal. Behavior is cooperative.        Thought Content: Thought content normal.        Cognition and Memory: Memory is not impaired. She does not exhibit impaired recent memory or impaired remote memory.        Judgment: Judgment normal.       Results for orders placed or performed in visit on 01/12/22  T3, free  Result Value Ref Range   T3, Free 3.3 2.3 - 4.2 pg/mL  T4, free  Result Value Ref Range   Free T4 0.67 0.60 - 1.60 ng/dL  TSH  Result Value Ref Range   TSH 4.04 0.35 - 5.50 uIU/mL    Assessment and Plan  Essential hypertension Assessment & Plan: Stable, chronic.  Continue current medication.  Losartan HCTZ 50/12.5 mg daily  HCTZ 12.5 mg daily.     Return if symptoms worsen or fail to improve.   Eliezer Lofts, MD

## 2022-03-17 NOTE — Assessment & Plan Note (Signed)
Stable, chronic.  Continue current medication.  Losartan HCTZ 50/12.5 mg daily  HCTZ 12.5 mg daily.

## 2022-03-18 ENCOUNTER — Ambulatory Visit: Payer: BC Managed Care – PPO | Admitting: Physical Therapy

## 2022-03-18 ENCOUNTER — Encounter: Payer: Self-pay | Admitting: Physical Therapy

## 2022-03-18 DIAGNOSIS — M25652 Stiffness of left hip, not elsewhere classified: Secondary | ICD-10-CM

## 2022-03-18 DIAGNOSIS — M25552 Pain in left hip: Secondary | ICD-10-CM | POA: Diagnosis not present

## 2022-03-18 DIAGNOSIS — M6281 Muscle weakness (generalized): Secondary | ICD-10-CM

## 2022-03-18 NOTE — Therapy (Signed)
OUTPATIENT PHYSICAL THERAPY TREATMENT NOTE   Patient Name: Jaime Chapman MRN: [id number] DOB:1960-08-06, 62 y.o., female Today's Date: 03/18/2022  PCP: Jinny Sanders   REFERRING PROVIDER: Donella Stade PA-C  END OF SESSION:   PT End of Session - 03/18/22 1102     Visit Number 2    Number of Visits 16    Date for PT Re-Evaluation 05/11/22    Authorization Type BCBS    PT Start Time 1103    PT Stop Time 1142    PT Time Calculation (min) 39 min             Past Medical History:  Diagnosis Date   Allergic rhinitis    Anxiety    Asthma    DDD (degenerative disc disease)    also has spinal arthritis   Depression    Diabetes mellitus    borderline   Dyslipidemia    Fibromyalgia    GERD (gastroesophageal reflux disease)    Hypertension    Lung nodules    RIGHT LUNG--LAST EVALUATED BY CHEST CT 02/01/12 - STABLE AND FOLLOW UP PLANNED IN ONE YEAR.   Lyme disease    Obesity    OSA (obstructive sleep apnea) 05/10/2017   Sinusitis    Stress disorder, acute    Vitamin D deficiency    Past Surgical History:  Procedure Laterality Date   CESAREAN SECTION  1982, 1985   x 2    ENDOMETRIAL ABLATION  2009   HERNIA REPAIR  04/14/12   ventral hernia repair   Laparoscopic ventral hernia repair w/ mesh  08/2009   Dr. Grandville Silos, [facility]   LAPAROSCOPY  2002   TUBAL LIGATION     VENTRAL HERNIA REPAIR  09/2006   Dr. Grandville Silos   VENTRAL HERNIA REPAIR N/A 04/14/2012   Procedure: Laparoscopic Repair of  Ventral Hernia;  Surgeon: Pedro Earls, MD;  Location: [facility];  Service: General;  Laterality: N/A;  Repair of Recurrent Ventral Hernia   Patient Active Problem List   Diagnosis Date Noted   Moderate episode of recurrent major depressive disorder ([place]) 06/03/2020   Attention deficit disorder (ADD) without hyperactivity 03/11/2020   Class 1 drug-induced obesity with serious comorbidity and body mass index (BMI) of 31.0 to 31.9 in adult 09/14/2019   OSA (obstructive sleep apnea)  05/10/2017   Pre-syncope 03/10/2017   Abnormal CT of the chest 08/31/2016   Panic attacks 04/09/2016   Essential hypertension 07/26/2014   Lyme disease 12/13/2013   Constipation - functional 11/20/2013   Hypothyroidism 11/16/2013   H/O ventral hernia repair 05/01/2012   Pulmonary nodules 01/26/2012   Prediabetes 03/04/2011   Fatty liver disease, nonalcoholic 40/98/1191   Generalized anxiety disorder 10/21/2010   VENTRAL HERNIA-twice recurrent 09/08/2009   Vitamin D deficiency 12/27/2008   Hyperlipidemia 05/22/2007   Moderate persistent asthma 05/22/2007   DEGENERATIVE DISC DISEASE 05/22/2007   Fibromyalgia 05/22/2007   Allergic rhinitis 04/14/2007   GERD 04/14/2007    REFERRING DIAG: M25.552 (ICD-10-CM) - Pain in left hip  THERAPY DIAG:  Pain in left hip  Stiffness of left hip, not elsewhere classified  Muscle weakness (generalized)  Rationale for Evaluation and Treatment Rehabilitation  PERTINENT HISTORY: Fibrolyalgia, Rt knee, Rt hip pain .  Bariatric surgery.  Back pain.  See above.   PRECAUTIONS: None  SUBJECTIVE:  SUBJECTIVE STATEMENT:  The back is flared up today. Pain is 7/10. Left Hip pain is 4/10, it was 7/10 over night. Took tylenol.    PAIN:  Are you having pain? 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 No neural impingement to explain leg symptoms.   MRI HIP 01/19/22: 1. Superior labral degeneration with a tear. 2. Mild tendinosis of the left gluteus minimus tendon  insertion. 3. Mild tendinosis of the left hamstring origin.   PATIENT SURVEYS:  FOTO 52%   COGNITION: Overall cognitive status: Within functional limits for tasks assessed                         SENSATION: WFL   EDEMA:  NT   MUSCLE LENGTH: Hamstrings: Good  Thomas test: NT   POSTURE: rounded shoulders, forward head, weight shift left, and genu varus    PALPATION: Tender TTP at left greater trochanter extending to mid thigh.  Pain in gluteus minimus, medius and piriformis muscle with palpation   LOWER EXTREMITY ROM:   Passive ROM Right eval Left eval  Hip flexion WNL WNL  Hip extension      Hip abduction      Hip adduction      Hip internal rotation decreased decreased  Hip external rotation Min Pain Pain   Knee flexion      Knee extension      Ankle dorsiflexion      Ankle plantarflexion      Ankle inversion      Ankle eversion       (Blank rows = not tested)   LOWER EXTREMITY MMT:   MMT Right eval Left eval  Hip flexion 4/5 4/5  Hip extension      Hip abduction 3+/5 3+/5  Hip adduction      Hip internal rotation      Hip external rotation      Knee flexion 5/5 5/5  Knee extension 4/5 5/5  Ankle dorsiflexion      Ankle plantarflexion      Ankle inversion      Ankle eversion       (Blank rows = not tested)   LOWER EXTREMITY SPECIAL TESTS:  Hip special tests: Saralyn Pilar (FABER) test: negative and Hip scouring test: negative   FUNCTIONAL TESTS:  5 times sit to stand: 12.7 sec  30 seconds chair stand test Timed up and go (TUG): NT 2 minute walk test: NT   GAIT: Distance walked: 100 Assistive device utilized: None Level of assistance: Complete Independence Comments: not Remarkable     TODAY'S TREATMENT:  [facility] Adult PT Treatment:                                                DATE: 03/18/22 Therapeutic Exercise: Nustep L3 5  minutes UE/LE Review of  initial HEP: see HEP Red band clam supine 10 x 2     DATE: 03/16/22: EVAL      PATIENT EDUCATION:  Education details: PT/POC, bursitis, MRI report, aquatics  Person educated: Patient Education method: Explanation and Handouts Education comprehension: verbalized understanding   HOME EXERCISE PROGRAM: Access Code: [facility] URL: [email or web address] Date: 03/16/2022 Prepared by: Raeford Razor   Exercises - Supine Lower Trunk Rotation  - 1 x daily - 7 x weekly - 2 sets - 10 reps - 10 hold - Seated Hamstring Stretch  - 1 x daily - 7 x weekly - 1 sets - 3 reps - 30 hold - Seated Piriformis Stretch  - 1 x daily - 7 x weekly - 1 sets - 3 reps - 20-30 hold - Standing Hip Flexor Stretch  - 1 x daily - 7 x weekly - 1 sets - 3 reps - 20-30 hold 03/18/22 Added red band supine clam , 2 sets of 10 , daily    ASSESSMENT:   CLINICAL IMPRESSION: Patient is a 62 y.o. female who was seen today for physical therapy treatment for L hip pain. She did not receive her HEP printout last session due to system being down. Reprinted and reviewed initial HEP and began Nustep and light lateral hip strength. She tolerated session without adverse effects.    OBJECTIVE IMPAIRMENTS: decreased activity tolerance, decreased balance, decreased mobility, difficulty walking, decreased ROM, decreased strength, hypomobility, increased fascial restrictions, impaired flexibility, and pain.    ACTIVITY LIMITATIONS: bending, sitting, standing, squatting, sleeping, stairs, transfers, and locomotion level   PARTICIPATION LIMITATIONS: interpersonal relationship, shopping, and community activity   PERSONAL FACTORS: Time since onset of injury/illness/exacerbation and 3+ comorbidities: polyarthralgia, fibromyalgia, stress  are also affecting patient's functional outcome.    REHAB POTENTIAL: Excellent   CLINICAL DECISION MAKING: Stable/uncomplicated   EVALUATION COMPLEXITY: Low      GOALS: Goals reviewed with patient? Yes   SHORT TERM GOALS: Target date: 04/13/2022     Pt will be I with HEP for hip strength and core/trunk Baseline: Goal status: INITIAL   2.  Pt will be able to notice increased ease with transfers after sitting short periods (10 min )  Baseline:  Goal status: INITIAL   3.  Pt will be screened for balance, fall risk and goal set  Baseline:  Goal status: INITIAL     LONG TERM GOALS: Target date: 05/11/2022     Pt will be able to show FOTO score increased to 59%  Baseline: 52% Goal status: INITIAL   2.  Pt will be able to sit for 20-30 min without increasing L hip pain for working on computer, meals  Baseline: 5-10 min  Goal status: INITIAL   3.  Pt will be able to increase lateral hip strength to 4+/5 or better for stability with gait Baseline: 3+/5 Goal status: INITIAL   4.  Pt will be able to lie on L side for portion of the night with no more than 4 /10 hip pain .  Baseline:  Goal status: INITIAL   5.  Patient will be independent in home exercise program  upon discharge Baseline:  Goal status: INITIAL       PLAN:   PT FREQUENCY: 2x/week   PT DURATION: 8 weeks   PLANNED INTERVENTIONS: Therapeutic exercises, Therapeutic activity, Neuromuscular re-education, Balance training, Gait training, Patient/Family education, Self Care, Joint mobilization, Aquatic Therapy, Dry Needling, Electrical stimulation, Cryotherapy, Moist heat, Taping, Ionotophoresis '4mg'$ /ml Dexamethasone, Manual therapy, and Re-evaluation   PLAN FOR NEXT SESSION: Check home exercise program hip AROM, core strengthening, dry needling left glute med, modalities if needed, manual.    Hessie Diener, PTA 03/18/22 1:01 PM Phone: [phone] Fax: [phone]

## 2022-03-24 ENCOUNTER — Ambulatory Visit: Payer: BC Managed Care – PPO | Admitting: Physical Therapy

## 2022-03-24 ENCOUNTER — Encounter: Payer: Self-pay | Admitting: Physical Therapy

## 2022-03-24 DIAGNOSIS — M6281 Muscle weakness (generalized): Secondary | ICD-10-CM | POA: Diagnosis not present

## 2022-03-24 DIAGNOSIS — M25652 Stiffness of left hip, not elsewhere classified: Secondary | ICD-10-CM

## 2022-03-24 DIAGNOSIS — M25552 Pain in left hip: Secondary | ICD-10-CM

## 2022-03-24 NOTE — Therapy (Signed)
OUTPATIENT PHYSICAL THERAPY TREATMENT NOTE   Patient Name: Jaime Chapman MRN: [id number] DOB:05-31-1960, 62 y.o., female Today's Date: 03/24/2022  PCP: Jinny Sanders   REFERRING PROVIDER: Donella Stade PA-C  END OF SESSION:   PT End of Session - 03/24/22 1107     Visit Number 3    Number of Visits 16    Date for PT Re-Evaluation 05/11/22    Authorization Type BCBS    PT Start Time 1100    PT Stop Time 1145    PT Time Calculation (min) 45 min    Activity Tolerance Patient tolerated treatment well    Behavior During Therapy WFL for tasks assessed/performed              Past Medical History:  Diagnosis Date   Allergic rhinitis    Anxiety    Asthma    DDD (degenerative disc disease)    also has spinal arthritis   Depression    Diabetes mellitus    borderline   Dyslipidemia    Fibromyalgia    GERD (gastroesophageal reflux disease)    Hypertension    Lung nodules    RIGHT LUNG--LAST EVALUATED BY CHEST CT 02/01/12 - STABLE AND FOLLOW UP PLANNED IN ONE YEAR.   Lyme disease    Obesity    OSA (obstructive sleep apnea) 05/10/2017   Sinusitis    Stress disorder, acute    Vitamin D deficiency    Past Surgical History:  Procedure Laterality Date   CESAREAN SECTION  1982, 1985   x 2    ENDOMETRIAL ABLATION  2009   HERNIA REPAIR  04/14/12   ventral hernia repair   Laparoscopic ventral hernia repair w/ mesh  08/2009   Dr. Grandville Silos, [facility]   LAPAROSCOPY  2002   TUBAL LIGATION     VENTRAL HERNIA REPAIR  09/2006   Dr. Grandville Silos   VENTRAL HERNIA REPAIR N/A 04/14/2012   Procedure: Laparoscopic Repair of  Ventral Hernia;  Surgeon: Pedro Earls, MD;  Location: [facility];  Service: General;  Laterality: N/A;  Repair of Recurrent Ventral Hernia   Patient Active Problem List   Diagnosis Date Noted   Moderate episode of recurrent major depressive disorder ([place]) 06/03/2020   Attention deficit disorder (ADD) without hyperactivity 03/11/2020   Class 1 drug-induced obesity  with serious comorbidity and body mass index (BMI) of 31.0 to 31.9 in adult 09/14/2019   OSA (obstructive sleep apnea) 05/10/2017   Pre-syncope 03/10/2017   Abnormal CT of the chest 08/31/2016   Panic attacks 04/09/2016   Essential hypertension 07/26/2014   Lyme disease 12/13/2013   Constipation - functional 11/20/2013   Hypothyroidism 11/16/2013   H/O ventral hernia repair 05/01/2012   Pulmonary nodules 01/26/2012   Prediabetes 03/04/2011   Fatty liver disease, nonalcoholic 25/95/6387   Generalized anxiety disorder 10/21/2010   VENTRAL HERNIA-twice recurrent 09/08/2009   Vitamin D deficiency 12/27/2008   Hyperlipidemia 05/22/2007   Moderate persistent asthma 05/22/2007   DEGENERATIVE DISC DISEASE 05/22/2007   Fibromyalgia 05/22/2007   Allergic rhinitis 04/14/2007   GERD 04/14/2007    REFERRING DIAG: M25.552 (ICD-10-CM) - Pain in left hip  THERAPY DIAG:  Pain in left hip  Stiffness of left hip, not elsewhere classified  Muscle weakness (generalized)  Rationale for Evaluation and Treatment Rehabilitation  PERTINENT HISTORY: Fibrolyalgia, Rt knee, Rt hip pain .  Bariatric surgery.  Back pain.  See above.   PRECAUTIONS: None  SUBJECTIVE:  SUBJECTIVE STATEMENT:  My Rt knee is so painful.  It is bone on bone (Dr. Marlou Sa).  L hip is ok 2/10-3/10 I can feel my back pain .    PAIN:  Are you having pain? Yes: NPRS scale: 3/10 Pain location: L hip Pain description: achy Aggravating factors: sitting, overactivity  Relieving factors: heat pad, OTC tylenol, changing position, injection Severe Rt knee pain .   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 No neural impingement  to explain leg symptoms.   MRI HIP 01/19/22: 1. Superior labral degeneration with a tear. 2. Mild tendinosis of the left gluteus minimus tendon insertion. 3. Mild tendinosis of the left hamstring origin.   PATIENT SURVEYS:  FOTO 52%   COGNITION: Overall cognitive status: Within functional limits for tasks assessed                         SENSATION: WFL   EDEMA:  NT   MUSCLE LENGTH: Hamstrings: Good  Thomas test: NT   POSTURE: rounded shoulders, forward head, weight shift left, and genu varus    PALPATION: Tender TTP at left greater trochanter extending to mid thigh.  Pain in gluteus minimus, medius and piriformis muscle with palpation   LOWER EXTREMITY ROM:   Passive ROM Right eval Left eval  Hip flexion WNL WNL  Hip extension      Hip abduction      Hip adduction      Hip internal rotation decreased decreased  Hip external rotation Min Pain Pain   Knee flexion      Knee extension      Ankle dorsiflexion      Ankle plantarflexion      Ankle inversion      Ankle eversion       (Blank rows = not tested)   LOWER EXTREMITY MMT:   MMT Right eval Left eval  Hip flexion 4/5 4/5  Hip extension      Hip abduction 3+/5 3+/5  Hip adduction      Hip internal rotation      Hip external rotation      Knee flexion 5/5 5/5  Knee extension 4/5 5/5  Ankle dorsiflexion      Ankle plantarflexion      Ankle inversion      Ankle eversion       (Blank rows = not tested)   LOWER EXTREMITY SPECIAL TESTS:  Hip special tests: Saralyn Pilar (FABER) test: negative and Hip scouring test: negative   FUNCTIONAL TESTS:  5 times sit to stand: 12.7 sec  30 seconds chair stand test Timed up and go (TUG): NT 2 minute walk test: NT   GAIT: Distance walked: 100 Assistive device utilized: None Level of assistance: Complete Independence Comments: not Remarkable     TODAY'S TREATMENT:  [facility] Adult PT Treatment:                                                DATE: 03/24/22 Therapeutic Exercise: NuStep level 6, 6 min  Seated figure 4; 30 sec x 3, 1/2 pigeon on Rt side as an alternative  Supine TrA activation with ball x 10 Bridge (partial) x 10  Lower trunk rotation /reset Supine red band clam x 20, upgraded to blue , single leg  Sidelying clam blue x 15  Hip abduction x 15 each side  [facility] Adult PT Treatment:                                                DATE: 03/18/22 Therapeutic Exercise: Nustep L3 5 minutes UE/LE Review of  initial HEP: see HEP Red band clam supine 10 x 2     DATE: 03/16/22: EVAL      PATIENT EDUCATION:  Education details: PT/POC, bursitis, MRI report, aquatics  Person educated: Patient Education method: Explanation and Handouts Education comprehension: verbalized understanding   HOME EXERCISE PROGRAM: Access Code: [facility] URL: [email or web address] Date: 03/16/2022 Prepared by: Raeford Razor  Access Code: [facility] URL: [email or web address] Date: 03/24/2022 Prepared by: Raeford Razor  Exercises - Supine Lower Trunk Rotation  - 1 x daily - 7 x weekly - 2 sets - 10 reps - 10 hold - Seated Hamstring Stretch  - 1 x daily - 7 x weekly - 1 sets - 3 reps - 30 hold - Seated Piriformis Stretch  - 1 x daily - 7 x weekly - 1 sets - 3 reps - 20-30 hold - Standing Hip Flexor Stretch  - 1 x daily - 7 x weekly - 1 sets - 3 reps - 20-30 hold - Bridge with Hip Abduction and Resistance  - 1 x daily - 7 x weekly - 2 sets - 10 reps - 5 hold - Clamshell with Resistance  - 1 x daily - 7 x weekly - 2 sets - 10 reps - 5 hold Hip abduction  ASSESSMENT:   CLINICAL IMPRESSION: Patient is chronic right knee pain that was more of an issue today with her her left hip.  Opted to stay on the mat and focus on core and hip strength today.  Post session she felt like her back was more stretched out and felt like  she was doing a good bit of work.  Noticed left side-lying clam exercise more challenging than the right.  Upgraded band tension.  Cont POC.    OBJECTIVE IMPAIRMENTS: decreased activity tolerance, decreased balance, decreased mobility, difficulty walking, decreased ROM, decreased strength, hypomobility, increased fascial restrictions, impaired flexibility, and pain.    ACTIVITY LIMITATIONS: bending, sitting, standing, squatting, sleeping, stairs, transfers, and locomotion level   PARTICIPATION LIMITATIONS: interpersonal relationship, shopping, and community activity   PERSONAL FACTORS: Time since onset of injury/illness/exacerbation and 3+ comorbidities: polyarthralgia, fibromyalgia, stress  are also affecting patient's functional outcome.    REHAB POTENTIAL: Excellent   CLINICAL DECISION MAKING: Stable/uncomplicated   EVALUATION COMPLEXITY: Low     GOALS: Goals reviewed with patient? Yes   SHORT TERM GOALS: Target date: 04/13/2022     Pt will be I with HEP for hip strength and  core/trunk Baseline: Goal status:in progress   2.  Pt will be able to notice increased ease with transfers after sitting short periods (10 min )  Baseline:  Goal status: in progress    3.  Pt will be screened for balance, fall risk and goal set  Baseline:  Goal status: INITIAL     LONG TERM GOALS: Target date: 05/11/2022     Pt will be able to show FOTO score increased to 59%  Baseline: 52% Goal status: INITIAL   2.  Pt will be able to sit for 20-30 min without increasing L hip pain for working on computer, meals  Baseline: 5-10 min  Goal status: INITIAL   3.  Pt will be able to increase lateral hip strength to 4+/5 or better for stability with gait Baseline: 3+/5 Goal status: INITIAL   4.  Pt will be able to lie on L side for portion of the night with no more than 4 /10 hip pain .  Baseline:  Goal status: INITIAL   5.  Patient will be independent in home exercise program upon  discharge Baseline:  Goal status: INITIAL       PLAN:   PT FREQUENCY: 2x/week   PT DURATION: 8 weeks   PLANNED INTERVENTIONS: Therapeutic exercises, Therapeutic activity, Neuromuscular re-education, Balance training, Gait training, Patient/Family education, Self Care, Joint mobilization, Aquatic Therapy, Dry Needling, Electrical stimulation, Cryotherapy, Moist heat, Taping, Ionotophoresis '4mg'$ /ml Dexamethasone, Manual therapy, and Re-evaluation   PLAN FOR NEXT SESSION: Balance screen.  Check home exercise program hip AROM, core strengthening, dry needling left glute med, modalities if needed, manual.   Raeford Razor, PT 03/24/22 11:53 AM Phone: [phone] Fax: [phone]

## 2022-03-26 ENCOUNTER — Encounter: Payer: Self-pay | Admitting: Physical Therapy

## 2022-03-26 ENCOUNTER — Ambulatory Visit: Payer: BC Managed Care – PPO | Admitting: Physical Therapy

## 2022-03-26 DIAGNOSIS — M25652 Stiffness of left hip, not elsewhere classified: Secondary | ICD-10-CM

## 2022-03-26 DIAGNOSIS — M6281 Muscle weakness (generalized): Secondary | ICD-10-CM | POA: Diagnosis not present

## 2022-03-26 DIAGNOSIS — M25552 Pain in left hip: Secondary | ICD-10-CM

## 2022-03-26 NOTE — Therapy (Signed)
OUTPATIENT PHYSICAL THERAPY TREATMENT NOTE   Patient Name: Jaime Chapman MRN: [id number] DOB:August 26, 1960, 62 y.o., female Today's Date: 03/26/2022  PCP: Jinny Sanders   REFERRING PROVIDER: Donella Stade PA-C  END OF SESSION:   PT End of Session - 03/26/22 1104     Visit Number 4    Number of Visits 16    Date for PT Re-Evaluation 05/11/22    Authorization Type [employer]    PT Start Time 1103    PT Stop Time 1145    PT Time Calculation (min) 42 min    Activity Tolerance Patient tolerated treatment well    Behavior During Therapy WFL for tasks assessed/performed               Past Medical History:  Diagnosis Date   Allergic rhinitis    Anxiety    Asthma    DDD (degenerative disc disease)    also has spinal arthritis   Depression    Diabetes mellitus    borderline   Dyslipidemia    Fibromyalgia    GERD (gastroesophageal reflux disease)    Hypertension    Lung nodules    RIGHT LUNG--LAST EVALUATED BY CHEST CT 02/01/12 - STABLE AND FOLLOW UP PLANNED IN ONE YEAR.   Lyme disease    Obesity    OSA (obstructive sleep apnea) 05/10/2017   Sinusitis    Stress disorder, acute    Vitamin D deficiency    Past Surgical History:  Procedure Laterality Date   CESAREAN SECTION  1982, 1985   x 2    ENDOMETRIAL ABLATION  2009   HERNIA REPAIR  04/14/12   ventral hernia repair   Laparoscopic ventral hernia repair w/ mesh  08/2009   Dr. Grandville Silos, [facility]   LAPAROSCOPY  2002   TUBAL LIGATION     VENTRAL HERNIA REPAIR  09/2006   Dr. Grandville Silos   VENTRAL HERNIA REPAIR N/A 04/14/2012   Procedure: Laparoscopic Repair of  Ventral Hernia;  Surgeon: Pedro Earls, MD;  Location: [facility];  Service: General;  Laterality: N/A;  Repair of Recurrent Ventral Hernia   Patient Active Problem List   Diagnosis Date Noted   Moderate episode of recurrent major depressive disorder ([place]) 06/03/2020   Attention deficit disorder (ADD) without hyperactivity 03/11/2020   Class 1 drug-induced  obesity with serious comorbidity and body mass index (BMI) of 31.0 to 31.9 in adult 09/14/2019   OSA (obstructive sleep apnea) 05/10/2017   Pre-syncope 03/10/2017   Abnormal CT of the chest 08/31/2016   Panic attacks 04/09/2016   Essential hypertension 07/26/2014   Lyme disease 12/13/2013   Constipation - functional 11/20/2013   Hypothyroidism 11/16/2013   H/O ventral hernia repair 05/01/2012   Pulmonary nodules 01/26/2012   Prediabetes 03/04/2011   Fatty liver disease, nonalcoholic 90/30/0923   Generalized anxiety disorder 10/21/2010   VENTRAL HERNIA-twice recurrent 09/08/2009   Vitamin D deficiency 12/27/2008   Hyperlipidemia 05/22/2007   Moderate persistent asthma 05/22/2007   DEGENERATIVE DISC DISEASE 05/22/2007   Fibromyalgia 05/22/2007   Allergic rhinitis 04/14/2007   GERD 04/14/2007    REFERRING DIAG: M25.552 (ICD-10-CM) - Pain in left hip  THERAPY DIAG:  Pain in left hip  Stiffness of left hip, not elsewhere classified  Muscle weakness (generalized)  Rationale for Evaluation and Treatment Rehabilitation  PERTINENT HISTORY: Fibrolyalgia, Rt knee, Rt hip pain .  Bariatric surgery.  Back pain.  See above.   PRECAUTIONS: None  SUBJECTIVE:  SUBJECTIVE STATEMENT:  4/10 back and L hip.  Shepain.  I am sore and I think it is from doing the stretches yesterday.  Of course my knee is hurting too.    PAIN:  Are you having pain? Yes: NPRS scale: 4/10 Pain location: L hip Pain description: achy Aggravating factors: sitting, overactivity  Relieving factors: heat pad, OTC tylenol, changing position, injection Severe Rt knee pain .   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  No neural impingement to explain leg symptoms.   MRI HIP 01/19/22: 1. Superior labral degeneration with a tear. 2. Mild tendinosis of the left gluteus minimus tendon insertion. 3. Mild tendinosis of the left hamstring origin.   PATIENT SURVEYS:  FOTO 52%   COGNITION: Overall cognitive status: Within functional limits for tasks assessed                         SENSATION: WFL   EDEMA:  NT   MUSCLE LENGTH: Hamstrings: Good  Thomas test: NT   POSTURE: rounded shoulders, forward head, weight shift left, and genu varus    PALPATION: Tender TTP at left greater trochanter extending to mid thigh.  Pain in gluteus minimus, medius and piriformis muscle with palpation   LOWER EXTREMITY ROM:   Passive ROM Right eval Left eval  Hip flexion WNL WNL  Hip extension      Hip abduction      Hip adduction      Hip internal rotation decreased decreased  Hip external rotation Min Pain Pain   Knee flexion      Knee extension      Ankle dorsiflexion      Ankle plantarflexion      Ankle inversion      Ankle eversion       (Blank rows = not tested)   LOWER EXTREMITY MMT:   MMT Right eval Left eval  Hip flexion 4/5 4/5  Hip extension      Hip abduction 3+/5 3+/5  Hip adduction      Hip internal rotation      Hip external rotation      Knee flexion 5/5 5/5  Knee extension 4/5 5/5  Ankle dorsiflexion      Ankle plantarflexion      Ankle inversion      Ankle eversion       (Blank rows = not tested)   LOWER EXTREMITY SPECIAL TESTS:  Hip special tests: Saralyn Pilar (FABER) test: negative and Hip scouring test: negative   FUNCTIONAL TESTS:  5 times sit to stand: 12.7 sec  30 seconds chair stand test Timed up and go (TUG): NT 2 minute walk test: NT   GAIT: Distance walked: 100 Assistive device utilized: None Level of assistance: Complete Independence Comments: not Remarkable     TODAY'S TREATMENT:  [facility] Adult PT Treatment:                                                DATE: 03/26/22 Therapeutic Exercise: 30 seconds chair stand test 12 reps  2 minute walk test: 431 feet , HR up to 149 and O2 sats 97% Standing balance on Airex : Tandem added head turns and nods for balance challenge , needed frequent UE assist Hip abduction and extension x 15 each LE  1/2 [place] on mat x  3, 30 sec    BERG BALANCE TEST Sitting to Standing: 4.      Stands without using hands and stabilize independently Standing Unsupported: 4.      Stands safely for 2 minutes Sitting Unsupported: 4.     Sits for 2 minutes independently Standing to Sitting: 4.     Sits safely with minimal use of hands Transfers: 4.     Transfers safely with minor use of hands Standing with eyes closed: 4.     Stands safely for 10 seconds  Standing with feet together: 4.     Stands for 1 minute safely Reaching forward with outstretched arm: 4.     Reaches forward 10 inches Retrieving object from the floor: 4.      Able to pick up easily and safely Turning to look behind: 4.     Looks behind from both sides and weight shifts well Turning 360 degrees: 4.     Able to turn in </=4 seconds  Place alternate foot on stool: 4.     Completes 8 steps in 20 seconds     Standing with one foot in front: 3.     Independent foot ahead for 30 seconds Standing on one foot: 3.     Holds 5-10 seconds  Total Score: 54/56       [facility] Adult PT Treatment:                                                DATE: 03/24/22 Therapeutic Exercise: NuStep level 6, 6 min  Seated figure 4; 30 sec x 3, 1/2 pigeon on Rt side as an alternative  Supine TrA activation with ball x 10 Bridge (partial) x 10  Lower trunk rotation /reset Supine red band clam x 20, upgraded to blue , single leg  Sidelying clam blue x 15  Hip abduction x 15 each side  [facility] Adult PT Treatment:                                                 DATE: 03/18/22 Therapeutic Exercise: Nustep L3 5 minutes UE/LE Review of  initial HEP: see HEP Red band clam supine 10 x 2     DATE: 03/16/22: EVAL      PATIENT EDUCATION:  Education details: PT/POC, bursitis, MRI report, aquatics  Person educated: Patient Education method: Explanation and Handouts Education comprehension: verbalized understanding   HOME EXERCISE PROGRAM: Access Code: [facility] URL: [email or web address] Date: 03/16/2022 Prepared by: Raeford Razor  Access Code: [facility] URL: [email or web address] Date: 03/24/2022 Prepared by: Raeford Razor  Exercises - Supine Lower Trunk Rotation  - 1 x daily - 7 x weekly - 2 sets - 10 reps - 10 hold - Seated Hamstring Stretch  - 1 x daily - 7 x weekly - 1 sets - 3 reps - 30 hold - Seated Piriformis Stretch  - 1 x daily - 7 x weekly - 1 sets - 3 reps - 20-30 hold - Standing Hip Flexor Stretch  - 1 x daily - 7 x weekly - 1 sets - 3 reps - 20-30 hold - Bridge with Hip Abduction and Resistance  - 1 x daily - 7 x weekly - 2 sets - 10 reps - 5 hold - Clamshell with Resistance  - 1 x daily - 7 x weekly - 2 sets - 10 reps - 5 hold Hip abduction   ASSESSMENT:   CLINICAL IMPRESSION:  Berg balance score was 54/56 with mild difficulty in SLS and tandem.  2 min walk completed with sub-optimal endurance noted, HR 149 and increasing antalgic gait with distance.  Muscle fatigue in hips (in standing leg ) with SLR in standing.    OBJECTIVE IMPAIRMENTS: decreased activity tolerance, decreased balance, decreased mobility, difficulty walking, decreased ROM, decreased strength, hypomobility, increased fascial restrictions, impaired flexibility, and pain.    ACTIVITY LIMITATIONS: bending, sitting, standing, squatting, sleeping, stairs, transfers, and locomotion level   PARTICIPATION LIMITATIONS: interpersonal relationship, shopping, and community activity   PERSONAL FACTORS: Time since onset of  injury/illness/exacerbation and 3+ comorbidities: polyarthralgia, fibromyalgia, stress  are also affecting patient's functional outcome.    REHAB POTENTIAL: Excellent   CLINICAL DECISION MAKING: Stable/uncomplicated   EVALUATION COMPLEXITY: Low     GOALS: Goals reviewed with patient? Yes   SHORT TERM GOALS: Target date: 04/13/2022     Pt will be I with HEP for hip strength and core/trunk Baseline: Goal status:in progress   2.  Pt will be able to notice increased ease with transfers after sitting short periods (10 min )  Baseline:  Goal status: in progress    3.  Pt will be screened for balance, fall risk and goal set  Baseline:  Goal status: MET      LONG TERM GOALS: Target date: 05/11/2022     Pt will be able to show FOTO score increased to 59%  Baseline: 52% Goal status: INITIAL   2.  Pt will be able to sit for 20-30 min without increasing L hip pain for working on computer, meals  Baseline: 5-10 min  Goal status: INITIAL   3.  Pt will be able to increase lateral hip strength to 4+/5 or better for stability with gait Baseline: 3+/5 Goal status: INITIAL   4.  Pt will be able to lie on L side for portion of the night with no more than 4 /10 hip pain .  Baseline:  Goal status: INITIAL   5.  Patient will be independent in home exercise program upon discharge Baseline:  Goal status: INITIAL  6. Patient will be able to complete 2 min walk for 480 feet with min dyspnea, no increased hip pain.    Baseline: 431 feet KNEE PAIN   Goal status: INITIAL         PLAN:   PT FREQUENCY: 2x/week   PT DURATION: 8 weeks   PLANNED INTERVENTIONS: Therapeutic exercises, Therapeutic activity, Neuromuscular re-education, Balance training, Gait training, Patient/Family education, Self Care, Joint mobilization, Aquatic Therapy, Dry Needling, Electrical stimulation, Cryotherapy, Moist heat, Taping, Ionotophoresis '4mg'$ /ml Dexamethasone, Manual therapy, and Re-evaluation  PLAN FOR  NEXT SESSION: Check home exercise program hip AROM, core strengthening, dry needling left glute med, modalities if needed, manual.   Raeford Razor, PT 03/26/22 11:47 AM Phone: [phone] Fax: [phone]

## 2022-03-30 ENCOUNTER — Ambulatory Visit: Payer: BC Managed Care – PPO

## 2022-03-30 DIAGNOSIS — M6281 Muscle weakness (generalized): Secondary | ICD-10-CM

## 2022-03-30 DIAGNOSIS — M25652 Stiffness of left hip, not elsewhere classified: Secondary | ICD-10-CM | POA: Diagnosis not present

## 2022-03-30 DIAGNOSIS — M25552 Pain in left hip: Secondary | ICD-10-CM | POA: Diagnosis not present

## 2022-03-30 NOTE — Therapy (Signed)
OUTPATIENT PHYSICAL THERAPY TREATMENT NOTE   Patient Name: Jaime Chapman MRN: [id number] DOB:October 20, 1960, 62 y.o., female Today's Date: 03/30/2022  PCP: Jinny Sanders   REFERRING PROVIDER: Donella Stade PA-C  END OF SESSION:   PT End of Session - 03/30/22 1101     Visit Number 5    Number of Visits 16    Date for PT Re-Evaluation 05/11/22    Authorization Type [employer]    PT Start Time 1101    PT Stop Time 1145    PT Time Calculation (min) 44 min    Activity Tolerance Patient tolerated treatment well    Behavior During Therapy WFL for tasks assessed/performed                Past Medical History:  Diagnosis Date   Allergic rhinitis    Anxiety    Asthma    DDD (degenerative disc disease)    also has spinal arthritis   Depression    Diabetes mellitus    borderline   Dyslipidemia    Fibromyalgia    GERD (gastroesophageal reflux disease)    Hypertension    Lung nodules    RIGHT LUNG--LAST EVALUATED BY CHEST CT 02/01/12 - STABLE AND FOLLOW UP PLANNED IN ONE YEAR.   Lyme disease    Obesity    OSA (obstructive sleep apnea) 05/10/2017   Sinusitis    Stress disorder, acute    Vitamin D deficiency    Past Surgical History:  Procedure Laterality Date   CESAREAN SECTION  1982, 1985   x 2    ENDOMETRIAL ABLATION  2009   HERNIA REPAIR  04/14/12   ventral hernia repair   Laparoscopic ventral hernia repair w/ mesh  08/2009   Dr. Grandville Silos, [facility]   LAPAROSCOPY  2002   TUBAL LIGATION     VENTRAL HERNIA REPAIR  09/2006   Dr. Grandville Silos   VENTRAL HERNIA REPAIR N/A 04/14/2012   Procedure: Laparoscopic Repair of  Ventral Hernia;  Surgeon: Pedro Earls, MD;  Location: [facility];  Service: General;  Laterality: N/A;  Repair of Recurrent Ventral Hernia   Patient Active Problem List   Diagnosis Date Noted   Moderate episode of recurrent major depressive disorder ([place]) 06/03/2020   Attention deficit disorder (ADD) without hyperactivity 03/11/2020   Class 1 drug-induced  obesity with serious comorbidity and body mass index (BMI) of 31.0 to 31.9 in adult 09/14/2019   OSA (obstructive sleep apnea) 05/10/2017   Pre-syncope 03/10/2017   Abnormal CT of the chest 08/31/2016   Panic attacks 04/09/2016   Essential hypertension 07/26/2014   Lyme disease 12/13/2013   Constipation - functional 11/20/2013   Hypothyroidism 11/16/2013   H/O ventral hernia repair 05/01/2012   Pulmonary nodules 01/26/2012   Prediabetes 03/04/2011   Fatty liver disease, nonalcoholic 46/50/3546   Generalized anxiety disorder 10/21/2010   VENTRAL HERNIA-twice recurrent 09/08/2009   Vitamin D deficiency 12/27/2008   Hyperlipidemia 05/22/2007   Moderate persistent asthma 05/22/2007   DEGENERATIVE DISC DISEASE 05/22/2007   Fibromyalgia 05/22/2007   Allergic rhinitis 04/14/2007   GERD 04/14/2007    REFERRING DIAG: M25.552 (ICD-10-CM) - Pain in left hip  THERAPY DIAG:  Pain in left hip  Stiffness of left hip, not elsewhere classified  Muscle weakness (generalized)  Rationale for Evaluation and Treatment Rehabilitation  PERTINENT HISTORY: Fibrolyalgia, Rt knee, Rt hip pain .  Bariatric surgery.  Back pain.  See above.   PRECAUTIONS: None  SUBJECTIVE:  SUBJECTIVE STATEMENT:  Pt reports her L hip pain is the same.    PAIN:  Are you having pain? Yes: NPRS scale: 4/10 Pain location: L hip Pain description: achy Aggravating factors: sitting, overactivity  Relieving factors: heat pad, OTC tylenol, changing position, injection Severe Rt knee pain .   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 No neural impingement to explain leg symptoms.   MRI HIP 01/19/22: 1. Superior labral  degeneration with a tear. 2. Mild tendinosis of the left gluteus minimus tendon insertion. 3. Mild tendinosis of the left hamstring origin.   PATIENT SURVEYS:  FOTO 52%   COGNITION: Overall cognitive status: Within functional limits for tasks assessed                         SENSATION: WFL   EDEMA:  NT   MUSCLE LENGTH: Hamstrings: Good  Thomas test: NT   POSTURE: rounded shoulders, forward head, weight shift left, and genu varus    PALPATION: Tender TTP at left greater trochanter extending to mid thigh.  Pain in gluteus minimus, medius and piriformis muscle with palpation   LOWER EXTREMITY ROM:   Passive ROM Right eval Left eval  Hip flexion WNL WNL  Hip extension      Hip abduction      Hip adduction      Hip internal rotation decreased decreased  Hip external rotation Min Pain Pain   Knee flexion      Knee extension      Ankle dorsiflexion      Ankle plantarflexion      Ankle inversion      Ankle eversion       (Blank rows = not tested)   LOWER EXTREMITY MMT:   MMT Right eval Left eval  Hip flexion 4/5 4/5  Hip extension      Hip abduction 3+/5 3+/5  Hip adduction      Hip internal rotation      Hip external rotation      Knee flexion 5/5 5/5  Knee extension 4/5 5/5  Ankle dorsiflexion      Ankle plantarflexion      Ankle inversion      Ankle eversion       (Blank rows = not tested)   LOWER EXTREMITY SPECIAL TESTS:  Hip special tests: Saralyn Pilar (FABER) test: negative and Hip scouring test: negative   FUNCTIONAL TESTS:  5 times sit to stand: 12.7 sec  30 seconds chair stand test Timed up and go (TUG): NT 2 minute walk test: NT   GAIT: Distance walked: 100 Assistive device utilized: None Level of assistance: Complete Independence Comments: not Remarkable     TODAY'S TREATMENT:  [facility] Adult PT Treatment:                                                 DATE: 03/30/22 Therapeutic Exercise: Piriformis stretch 3x30" Bridge with hip abd isometric BluTB and PPT x10 5" Hook lying clams BluTB 3x10 SLS on blue therapad x4 30" SL L hip abd 2x10 Manual Therapy: STM and DTM to the piriformis and gluteal muscles Skilled palpation to identify TrPs and taut muscle bands Trigger Point Dry Needling Treatment: Pre-treatment instruction: Patient instructed on dry needling rationale, procedures, and possible side effects including pain during treatment (achy,cramping feeling), bruising, drop of blood, lightheadedness, nausea, sweating. Patient Consent Given: Yes Education handout provided: Yes Muscles treated: L piriformis, glut minimus, glut medius  Needle size and number: .30x45m x 1 Electrical stimulation performed: No Parameters: N/A Treatment response/outcome: Twitch response elicited Post-treatment instructions: Patient instructed to expect possible mild to moderate muscle soreness later today and/or tomorrow. Patient instructed in methods to reduce muscle soreness and to continue prescribed HEP. If patient was dry needled over the lung field, patient was instructed on signs and symptoms of pneumothorax and, however unlikely, to see immediate medical attention should they occur. Patient was also educated on signs and symptoms of infection and to seek medical attention should they occur. Patient verbalized understanding of these instructions and education.   [facility]                                                [facility]: 03/26/22 Therapeutic Exercise: 30 seconds chair stand test 12 reps  2 minute walk test: 431 feet , HR up to 149 and O2 sats 97% Standing balance on Airex : Tandem added head turns and nods for balance challenge , needed frequent UE assist Hip abduction and extension x 15 each LE  1/2 [place] on mat x  3, 30 sec    BERG BALANCE TEST Sitting to Standing: 4.      Stands without using hands and  stabilize independently Standing Unsupported: 4.      Stands safely for 2 minutes Sitting Unsupported: 4.     Sits for 2 minutes independently Standing to Sitting: 4.     Sits safely with minimal use of hands Transfers: 4.     Transfers safely with minor use of hands Standing with eyes closed: 4.     Stands safely for 10 seconds  Standing with feet together: 4.     Stands for 1 minute safely Reaching forward with outstretched arm: 4.     Reaches forward 10 inches Retrieving object from the floor: 4.      Able to pick up easily and safely Turning to look behind: 4.     Looks behind from both sides and weight shifts well Turning 360 degrees: 4.     Able to turn in </=4 seconds  Place alternate foot on stool: 4.     Completes 8 steps in 20 seconds     Standing with one foot in front: 3.     Independent foot ahead for 30 seconds Standing on one foot: 3.     Holds 5-10 seconds  Total Score: 54/56  [facility] Adult PT Treatment:                                                DATE: 03/24/22 Therapeutic Exercise: NuStep level 6, 6 min  Seated figure 4; 30 sec x 3, 1/2 pigeon on Rt side as an alternative  Supine TrA activation with ball x 10 Bridge (partial) x 10  Lower trunk rotation /reset Supine red band clam x 20, upgraded to blue , single leg  Sidelying clam blue x 15  Hip abduction x 15 each side    DATE: 03/16/22: EVAL      PATIENT EDUCATION:  Education details: PT/POC, bursitis, MRI report, aquatics  Person educated: Patient Education method: Explanation and Handouts Education comprehension: verbalized understanding   HOME EXERCISE PROGRAM: Access Code: [facility] URL: [email or web address] Date: 03/16/2022 Prepared by: Raeford Razor  Access Code: [facility] URL: [email or web address] Date: 03/24/2022 Prepared by: Raeford Razor  Exercises - Supine Lower Trunk Rotation  - 1 x daily - 7 x weekly - 2 sets - 10 reps - 10 hold - Seated Hamstring  Stretch  - 1 x daily - 7 x weekly - 1 sets - 3 reps - 30 hold - Seated Piriformis Stretch  - 1 x daily - 7 x weekly - 1 sets - 3 reps - 20-30 hold - Standing Hip Flexor Stretch  - 1 x daily - 7 x weekly - 1 sets - 3 reps - 20-30 hold - Bridge with Hip Abduction and Resistance  - 1 x daily - 7 x weekly - 2 sets - 10 reps - 5 hold - Clamshell with Resistance  - 1 x daily - 7 x weekly - 2 sets - 10 reps - 5 hold Hip abduction   ASSESSMENT:   CLINICAL IMPRESSION: PT was completed for STM/DTM to the L piriformis and gluteal muscles f/b TPDN as noted above. Muscle twitch responses were elicited. Pt then completed flexibility and strengthening therex to the L hip muscles for activation. At the end of the session, pt reported a decrease in L hip pain from 4/10 to 2/10. Pt's complete response to PT today will be assessed the next session. Pt tolerated PT today without adverse effects.  OBJECTIVE IMPAIRMENTS: decreased activity tolerance, decreased balance, decreased mobility, difficulty walking, decreased ROM, decreased strength, hypomobility, increased fascial restrictions, impaired flexibility, and pain.    ACTIVITY LIMITATIONS: bending, sitting, standing, squatting, sleeping, stairs, transfers, and locomotion level   PARTICIPATION LIMITATIONS: interpersonal relationship, shopping, and community activity   PERSONAL FACTORS: Time since onset of injury/illness/exacerbation and 3+ comorbidities: polyarthralgia, fibromyalgia, stress  are also affecting patient's functional outcome.    REHAB POTENTIAL: Excellent   CLINICAL DECISION MAKING: Stable/uncomplicated   EVALUATION COMPLEXITY: Low     GOALS: Goals reviewed with patient? Yes   SHORT TERM GOALS: Target date: 04/13/2022     Pt will be I with HEP for hip strength and core/trunk Baseline: Goal status:in progress   2.  Pt will be able to notice increased ease with transfers after sitting short periods (10 min )  Baseline:  Goal status: in  progress    3.  Pt will be screened for balance, fall risk and goal set  Baseline:  Goal status: MET      LONG TERM GOALS: Target date: 05/11/2022     Pt will be able to show FOTO  score increased to 59%  Baseline: 52% Goal status: INITIAL   2.  Pt will be able to sit for 20-30 min without increasing L hip pain for working on computer, meals  Baseline: 5-10 min  Goal status: INITIAL   3.  Pt will be able to increase lateral hip strength to 4+/5 or better for stability with gait Baseline: 3+/5 Goal status: INITIAL   4.  Pt will be able to lie on L side for portion of the night with no more than 4 /10 hip pain .  Baseline:  Goal status: INITIAL   5.  Patient will be independent in home exercise program upon discharge Baseline:  Goal status: INITIAL  6. Patient will be able to complete 2 min walk for 480 feet with min dyspnea, no increased hip pain.    Baseline: 431 feet KNEE PAIN   Goal status: INITIAL         PLAN:   PT FREQUENCY: 2x/week   PT DURATION: 8 weeks   PLANNED INTERVENTIONS: Therapeutic exercises, Therapeutic activity, Neuromuscular re-education, Balance training, Gait training, Patient/Family education, Self Care, Joint mobilization, Aquatic Therapy, Dry Needling, Electrical stimulation, Cryotherapy, Moist heat, Taping, Ionotophoresis '4mg'$ /ml Dexamethasone, Manual therapy, and Re-evaluation   PLAN FOR NEXT SESSION: Check home exercise program hip AROM, core strengthening, dry needling left glute med, modalities if needed, manual.   Raeford Razor, PT 03/30/22 4:15 PM Phone: [phone] Fax: [phone]

## 2022-03-30 NOTE — Patient Instructions (Signed)

## 2022-03-31 ENCOUNTER — Other Ambulatory Visit: Payer: Self-pay | Admitting: Nurse Practitioner

## 2022-03-31 DIAGNOSIS — I10 Essential (primary) hypertension: Secondary | ICD-10-CM

## 2022-03-31 NOTE — Therapy (Unsigned)
OUTPATIENT PHYSICAL THERAPY TREATMENT NOTE   Patient Name: Jaime Chapman MRN: [id number] DOB:11-Oct-1960, 62 y.o., female Today's Date: 04/01/2022  PCP: Jinny Sanders   REFERRING PROVIDER: Donella Stade PA-C  END OF SESSION:   PT End of Session - 04/01/22 1105     Visit Number 6    Number of Visits 16    Date for PT Re-Evaluation 05/11/22    Authorization Type BCBS    PT Start Time 1102    PT Stop Time 8768    PT Time Calculation (min) 43 min    Activity Tolerance Patient tolerated treatment well;Patient limited by pain    Behavior During Therapy WFL for tasks assessed/performed                 Past Medical History:  Diagnosis Date   Allergic rhinitis    Anxiety    Asthma    DDD (degenerative disc disease)    also has spinal arthritis   Depression    Diabetes mellitus    borderline   Dyslipidemia    Fibromyalgia    GERD (gastroesophageal reflux disease)    Hypertension    Lung nodules    RIGHT LUNG--LAST EVALUATED BY CHEST CT 02/01/12 - STABLE AND FOLLOW UP PLANNED IN ONE YEAR.   Lyme disease    Obesity    OSA (obstructive sleep apnea) 05/10/2017   Sinusitis    Stress disorder, acute    Vitamin D deficiency    Past Surgical History:  Procedure Laterality Date   CESAREAN SECTION  1982, 1985   x 2    ENDOMETRIAL ABLATION  2009   HERNIA REPAIR  04/14/12   ventral hernia repair   Laparoscopic ventral hernia repair w/ mesh  08/2009   Dr. Grandville Silos, [facility]   LAPAROSCOPY  2002   TUBAL LIGATION     VENTRAL HERNIA REPAIR  09/2006   Dr. Grandville Silos   VENTRAL HERNIA REPAIR N/A 04/14/2012   Procedure: Laparoscopic Repair of  Ventral Hernia;  Surgeon: Pedro Earls, MD;  Location: [facility];  Service: General;  Laterality: N/A;  Repair of Recurrent Ventral Hernia   Patient Active Problem List   Diagnosis Date Noted   Moderate episode of recurrent major depressive disorder ([place]) 06/03/2020   Attention deficit disorder (ADD) without hyperactivity 03/11/2020    Class 1 drug-induced obesity with serious comorbidity and body mass index (BMI) of 31.0 to 31.9 in adult 09/14/2019   OSA (obstructive sleep apnea) 05/10/2017   Pre-syncope 03/10/2017   Abnormal CT of the chest 08/31/2016   Panic attacks 04/09/2016   Essential hypertension 07/26/2014   Lyme disease 12/13/2013   Constipation - functional 11/20/2013   Hypothyroidism 11/16/2013   H/O ventral hernia repair 05/01/2012   Pulmonary nodules 01/26/2012   Prediabetes 03/04/2011   Fatty liver disease, nonalcoholic 11/57/2620   Generalized anxiety disorder 10/21/2010   VENTRAL HERNIA-twice recurrent 09/08/2009   Vitamin D deficiency 12/27/2008   Hyperlipidemia 05/22/2007   Moderate persistent asthma 05/22/2007   DEGENERATIVE DISC DISEASE 05/22/2007   Fibromyalgia 05/22/2007   Allergic rhinitis 04/14/2007   GERD 04/14/2007    REFERRING DIAG: M25.552 (ICD-10-CM) - Pain in left hip  THERAPY DIAG:  Pain in left hip  Stiffness of left hip, not elsewhere classified  Muscle weakness (generalized)  Rationale for Evaluation and Treatment Rehabilitation  PERTINENT HISTORY: Fibrolyalgia, Rt knee, Rt hip pain .  Bariatric surgery.  Back pain.  See above.   PRECAUTIONS: None  SUBJECTIVE:  SUBJECTIVE STATEMENT: I think the hip is better.  Its a little sore.  The knee was so bad last night I could barely stand it. Stressful home situation with granddaughter.    PAIN:  Are you having pain? 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 No neural impingement to explain leg symptoms.   MRI HIP 01/19/22: 1. Superior labral degeneration with a tear. 2. Mild tendinosis of the left gluteus minimus tendon insertion. 3. Mild tendinosis of the left hamstring origin.   PATIENT SURVEYS:  FOTO 52%   COGNITION: Overall cognitive status: Within functional limits for tasks assessed                         SENSATION: WFL   EDEMA:  NT   MUSCLE LENGTH: Hamstrings: Good  Thomas test: NT   POSTURE: rounded shoulders, forward head, weight shift left, and genu varus    PALPATION: Tender TTP at left greater trochanter extending to mid thigh.  Pain in gluteus minimus, medius and piriformis muscle with palpation   LOWER EXTREMITY ROM:   Passive ROM Right eval Left eval  Hip flexion WNL WNL  Hip extension      Hip abduction      Hip adduction      Hip internal rotation decreased decreased  Hip external rotation Min Pain Pain   Knee flexion      Knee extension      Ankle dorsiflexion      Ankle plantarflexion      Ankle inversion      Ankle eversion       (Blank rows = not tested)   LOWER EXTREMITY MMT:   MMT Right eval Left eval Lt.  04/01/22  Hip flexion 4/5 4/5   Hip extension       Hip abduction 3+/5 3+/5   Hip adduction       Hip internal rotation       Hip external rotation       Knee flexion 5/5 5/5   Knee extension 4/5 5/5   Ankle dorsiflexion       Ankle plantarflexion       Ankle inversion       Ankle eversion        (Blank rows = not tested)   LOWER EXTREMITY SPECIAL TESTS:  Hip special tests: Saralyn Pilar (FABER) test: negative and Hip scouring test: negative   FUNCTIONAL TESTS:  5 times sit to stand: 12.7 sec  30 seconds chair stand test Timed up and go (TUG): NT 2 minute walk test: NT   GAIT: Distance walked: 100 Assistive device utilized: None Level of assistance: Complete Independence Comments: not Remarkable     TODAY'S TREATMENT:                [facility] Adult PT Treatment:                                                DATE: 04/01/22 Therapeutic Exercise: NuStep L5 UE and LE for 5 min Standing wall sit  15 sec x 5 blue band on thighs  Blue banded lateral stepping 3 x 30 feet Hip hinge glider lunge used chair in front  Supine banded clam x 15, double and single  Banded bridge x 10  Added march in bridge  Single leg bridge x 10 each side   Piriformis stretch 3x30"                                                                   [facility] Adult PT Treatment:                                                DATE: 03/30/22 Therapeutic Exercise: Piriformis stretch 3x30" Bridge with hip abd isometric BluTB and PPT x10 5" Hook lying clams BluTB 3x10 SLS on blue therapad x4 30" SL L hip abd 2x10 Manual Therapy: STM and DTM to the piriformis and gluteal muscles Skilled palpation to identify TrPs and taut muscle bands Trigger Point Dry Needling Treatment: Pre-treatment instruction: Patient instructed on dry needling rationale, procedures, and possible side effects including pain during treatment (achy,cramping feeling), bruising, drop of blood, lightheadedness, nausea, sweating. Patient Consent Given: Yes Education handout provided: Yes Muscles treated: L piriformis, glut minimus, glut medius  Needle size and number: .30x97m x 1 Electrical stimulation performed: No Parameters: N/A Treatment response/outcome: Twitch response elicited Post-treatment instructions: Patient instructed to expect possible mild to moderate muscle soreness later today and/or tomorrow. Patient instructed in methods to reduce muscle soreness and to continue prescribed HEP. If patient was dry needled over the lung field, patient was instructed on signs and symptoms of pneumothorax and, however unlikely, to see immediate medical attention should they occur. Patient was also educated on signs and symptoms of infection and to seek medical attention should they  occur. Patient verbalized understanding of these instructions and education.       PATIENT EDUCATION:  Education details: PT/POC, bursitis, MRI report, aquatics  Person educated: Patient Education method: ETheatre stage managerEducation comprehension: verbalized understanding   HOME EXERCISE PROGRAM:  Access Code: [facility] V[facility]quezURL: [email or web address] Date: 03/24/2022 Prepared by: JRaeford Razor Exercises - Supine Lower Trunk Rotation  - 1 x daily - 7 x weekly - 2 sets - 10 reps - 10 hold - Seated Hamstring Stretch  - 1 x daily - 7 x weekly - 1 sets - 3 reps - 30 hold - Seated Piriformis Stretch  - 1 x daily - 7 x weekly - 1 sets - 3 reps - 20-30 hold - Standing Hip Flexor Stretch  - 1 x daily - 7 x weekly - 1 sets - 3 reps - 20-30 hold - Bridge with Hip Abduction and Resistance  - 1 x daily - 7 x weekly - 2 sets - 10 reps - 5 hold - Clamshell with Resistance  - 1 x daily - 7 x weekly - 2 sets - 10 reps - 5 hold -Hip abduction   ASSESSMENT:   CLINICAL IMPRESSION: Patient making good gains with respect to her hip and her back.  Noticing less pain overall.  Her Rt knee is more of an issue.  She  did tolerate some standing exercises without increasing knee pain. She may be getting a 2nd opinion regarding her knee.  She understands the need for hip strength to support knee regardless of  decision to have surgery or not.   OBJECTIVE IMPAIRMENTS: decreased activity tolerance, decreased balance, decreased mobility, difficulty walking, decreased ROM, decreased strength, hypomobility, increased fascial restrictions, impaired flexibility, and pain.    ACTIVITY LIMITATIONS: bending, sitting, standing, squatting, sleeping, stairs, transfers, and locomotion level   PARTICIPATION LIMITATIONS: interpersonal relationship, shopping, and community activity   PERSONAL FACTORS: Time since onset of injury/illness/exacerbation and 3+ comorbidities: polyarthralgia, fibromyalgia, stress   are also affecting patient's functional outcome.    REHAB POTENTIAL: Excellent   CLINICAL DECISION MAKING: Stable/uncomplicated   EVALUATION COMPLEXITY: Low     GOALS: Goals reviewed with patient? Yes   SHORT TERM GOALS: Target date: 04/13/2022     Pt will be I with HEP for hip strength and core/trunk Baseline: Goal status:in progress   2.  Pt will be able to notice increased ease with transfers after sitting short periods (10 min )  Baseline:  Goal status: in progress    3.  Pt will be screened for balance, fall risk and goal set  Baseline:  Goal status: MET      LONG TERM GOALS: Target date: 05/11/2022     Pt will be able to show FOTO score increased to 59%  Baseline: 52% Goal status: INITIAL   2.  Pt will be able to sit for 20-30 min without increasing L hip pain for working on computer, meals  Baseline: 5-10 min  Goal status: INITIAL   3.  Pt will be able to increase lateral hip strength to 4+/5 or better for stability with gait Baseline: 3+/5 Goal status: INITIAL   4.  Pt will be able to lie on L side for portion of the night with no more than 4 /10 hip pain .  Baseline:  Goal status: INITIAL   5.  Patient will be independent in home exercise program upon discharge Baseline:  Goal status: INITIAL  6. Patient will be able to complete 2 min walk for 480 feet with min dyspnea, no increased hip pain.    Baseline: 431 feet KNEE PAIN   Goal status: INITIAL         PLAN:   PT FREQUENCY: 2x/week   PT DURATION: 8 weeks   PLANNED INTERVENTIONS: Therapeutic exercises, Therapeutic activity, Neuromuscular re-education, Balance training, Gait training, Patient/Family education, Self Care, Joint mobilization, Aquatic Therapy, Dry Needling, Electrical stimulation, Cryotherapy, Moist heat, Taping, Ionotophoresis '4mg'$ /ml Dexamethasone, Manual therapy, and Re-evaluation   PLAN FOR NEXT SESSION: Check home exercise program hip AROM, core strengthening, dry needling  left glute med, modalities if needed, manual.   Raeford Razor, PT 04/01/22 12:50 PM Phone: [phone] Fax: [phone]

## 2022-04-01 ENCOUNTER — Ambulatory Visit: Payer: BC Managed Care – PPO | Attending: Family Medicine | Admitting: Physical Therapy

## 2022-04-01 ENCOUNTER — Encounter: Payer: Self-pay | Admitting: Physical Therapy

## 2022-04-01 DIAGNOSIS — M25552 Pain in left hip: Secondary | ICD-10-CM | POA: Diagnosis not present

## 2022-04-01 DIAGNOSIS — M6281 Muscle weakness (generalized): Secondary | ICD-10-CM | POA: Diagnosis not present

## 2022-04-01 DIAGNOSIS — M25652 Stiffness of left hip, not elsewhere classified: Secondary | ICD-10-CM | POA: Insufficient documentation

## 2022-04-06 ENCOUNTER — Ambulatory Visit: Payer: BC Managed Care – PPO | Admitting: Physical Therapy

## 2022-04-06 DIAGNOSIS — M25652 Stiffness of left hip, not elsewhere classified: Secondary | ICD-10-CM | POA: Diagnosis not present

## 2022-04-06 DIAGNOSIS — M25552 Pain in left hip: Secondary | ICD-10-CM

## 2022-04-06 DIAGNOSIS — M6281 Muscle weakness (generalized): Secondary | ICD-10-CM | POA: Diagnosis not present

## 2022-04-06 NOTE — Therapy (Signed)
OUTPATIENT PHYSICAL THERAPY TREATMENT NOTE   Patient Name: Jaime Chapman MRN: [id number] DOB:[birth year], 61 y.o., female Today's Date: 04/06/2022  PCP: Jinny Sanders   REFERRING PROVIDER: Donella Stade PA-C  END OF SESSION:   PT End of Session - 04/06/22 1103     Visit Number 7    Number of Visits 16    Date for PT Re-Evaluation 05/11/22    Authorization Type BCBS    PT Start Time 1102    PT Stop Time 1143    PT Time Calculation (min) 41 min                 Past Medical History:  Diagnosis Date   Allergic rhinitis    Anxiety    Asthma    DDD (degenerative disc disease)    also has spinal arthritis   Depression    Diabetes mellitus    borderline   Dyslipidemia    Fibromyalgia    GERD (gastroesophageal reflux disease)    Hypertension    Lung nodules    RIGHT LUNG--LAST EVALUATED BY CHEST CT 02/01/12 - STABLE AND FOLLOW UP PLANNED IN ONE YEAR.   Lyme disease    Obesity    OSA (obstructive sleep apnea) 05/10/2017   Sinusitis    Stress disorder, acute    Vitamin D deficiency    Past Surgical History:  Procedure Laterality Date   CESAREAN SECTION  1982, 1985   x 2    ENDOMETRIAL ABLATION  2009   HERNIA REPAIR  04/14/12   ventral hernia repair   Laparoscopic ventral hernia repair w/ mesh  08/2009   Dr. Grandville Silos, [facility]   LAPAROSCOPY  2002   TUBAL LIGATION     VENTRAL HERNIA REPAIR  09/2006   Dr. Grandville Silos   VENTRAL HERNIA REPAIR N/A 04/14/2012   Procedure: Laparoscopic Repair of  Ventral Hernia;  Surgeon: Pedro Earls, MD;  Location: [facility];  Service: General;  Laterality: N/A;  Repair of Recurrent Ventral Hernia   Patient Active Problem List   Diagnosis Date Noted   Moderate episode of recurrent major depressive disorder ([place]) 06/03/2020   Attention deficit disorder (ADD) without hyperactivity 03/11/2020   Class 1 drug-induced obesity with serious comorbidity and body mass index (BMI) of 31.0 to 31.9 in adult 09/14/2019   OSA (obstructive sleep  apnea) 05/10/2017   Pre-syncope 03/10/2017   Abnormal CT of the chest 08/31/2016   Panic attacks 04/09/2016   Essential hypertension 07/26/2014   Lyme disease 12/13/2013   Constipation - functional 11/20/2013   Hypothyroidism 11/16/2013   H/O ventral hernia repair 05/01/2012   Pulmonary nodules 01/26/2012   Prediabetes 03/04/2011   Fatty liver disease, nonalcoholic 70/26/3785   Generalized anxiety disorder 10/21/2010   VENTRAL HERNIA-twice recurrent 09/08/2009   Vitamin D deficiency 12/27/2008   Hyperlipidemia 05/22/2007   Moderate persistent asthma 05/22/2007   DEGENERATIVE DISC DISEASE 05/22/2007   Fibromyalgia 05/22/2007   Allergic rhinitis 04/14/2007   GERD 04/14/2007    REFERRING DIAG: M25.552 (ICD-10-CM) - Pain in left hip  THERAPY DIAG:  Pain in left hip  Stiffness of left hip, not elsewhere classified  Muscle weakness (generalized)  Rationale for Evaluation and Treatment Rehabilitation  PERTINENT HISTORY: Fibrolyalgia, Rt knee, Rt hip pain .  Bariatric surgery.  Back pain.  See above.   PRECAUTIONS: None  SUBJECTIVE:  SUBJECTIVE STATEMENT: Back is a 3/10. Knee is hurting 5/10 but was not as bad last night like usual.    PAIN:  Are you having pain? 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 No neural impingement to explain leg symptoms.   MRI HIP 01/19/22: 1. Superior labral degeneration with a tear. 2. Mild tendinosis of the left gluteus  minimus tendon insertion. 3. Mild tendinosis of the left hamstring origin.   PATIENT SURVEYS:  FOTO 52% FOTO 57% 04/06/22   COGNITION: Overall cognitive status: Within functional limits for tasks assessed                         SENSATION: WFL   EDEMA:  NT   MUSCLE LENGTH: Hamstrings: Good  Thomas test: NT   POSTURE: rounded shoulders, forward head, weight shift left, and genu varus    PALPATION: Tender TTP at left greater trochanter extending to mid thigh.  Pain in gluteus minimus, medius and piriformis muscle with palpation   LOWER EXTREMITY ROM:   Passive ROM Right eval Left eval  Hip flexion WNL WNL  Hip extension      Hip abduction      Hip adduction      Hip internal rotation decreased decreased  Hip external rotation Min Pain Pain   Knee flexion      Knee extension      Ankle dorsiflexion      Ankle plantarflexion      Ankle inversion      Ankle eversion       (Blank rows = not tested)   LOWER EXTREMITY MMT:   MMT Right eval Left eval Lt.  04/01/22  Hip flexion 4/5 4/5   Hip extension       Hip abduction 3+/5 3+/5   Hip adduction       Hip internal rotation       Hip external rotation       Knee flexion 5/5 5/5   Knee extension 4/5 5/5   Ankle dorsiflexion       Ankle plantarflexion       Ankle inversion       Ankle eversion        (Blank rows = not tested)   LOWER EXTREMITY SPECIAL TESTS:  Hip special tests: Saralyn Pilar (FABER) test: negative and Hip scouring test: negative   FUNCTIONAL TESTS:  5 times sit to stand: 12.7 sec  30 seconds chair stand test Timed up and go (TUG): NT 2 minute walk test: NT   GAIT: Distance walked: 100 Assistive device utilized: None Level of assistance: Complete Independence Comments: not Remarkable     TODAY'S TREATMENT:             [facility] Adult PT Treatment:                                                DATE: 04/06/22 Therapeutic Exercise: Nustep L5 UE/LE x 5 minutes  Standing wall sit 15 sec x 5 blue band  on thighs  Blue banded lateral  stepping 3 x 30 feet Banded bridge x 10  with clam , blue R SLR with ER x 10 each - cues for ab brace Hip abduction x 10, hip circles x 10 each way , bilat  Gluteal/ piriformis stretch  Supine 90/90 5 sec holds x 10  Hamstring stretch with strap    [facility] Adult PT Treatment:                                                DATE: 04/01/22 Therapeutic Exercise: NuStep L5 UE and LE for 5 min Standing wall sit 15 sec x 5 blue band on thighs  Blue banded lateral stepping 3 x 30 feet Hip hinge glider lunge used chair in front  Supine banded clam x 15, double and single  Banded bridge x 10  Added march in bridge  Single leg bridge x 10 each side   Piriformis stretch 3x30"                                                                   [facility] Adult PT Treatment:                                                DATE: 03/30/22 Therapeutic Exercise: Piriformis stretch 3x30" Bridge with hip abd isometric BluTB and PPT x10 5" Hook lying clams BluTB 3x10 SLS on blue therapad x4 30" SL L hip abd 2x10 Manual Therapy: STM and DTM to the piriformis and gluteal muscles Skilled palpation to identify TrPs and taut muscle bands Trigger Point Dry Needling Treatment: Pre-treatment instruction: Patient instructed on dry needling rationale, procedures, and possible side effects including pain during treatment (achy,cramping feeling), bruising, drop of blood, lightheadedness, nausea, sweating. Patient Consent Given: Yes Education handout provided: Yes Muscles treated: L piriformis, glut minimus, glut medius  Needle size and number: .30x58m x 1 Electrical stimulation performed: No Parameters: N/A Treatment response/outcome: Twitch response elicited Post-treatment instructions: Patient instructed to expect possible mild to moderate muscle soreness later today and/or tomorrow. Patient instructed in methods to reduce muscle soreness and to continue prescribed HEP. If patient was dry  needled over the lung field, patient was instructed on signs and symptoms of pneumothorax and, however unlikely, to see immediate medical attention should they occur. Patient was also educated on signs and symptoms of infection and to seek medical attention should they occur. Patient verbalized understanding of these instructions and education.       PATIENT EDUCATION:  Education details: PT/POC, bursitis, MRI report, aquatics  Person educated: Patient Education method: ETheatre stage managerEducation comprehension: verbalized understanding   HOME EXERCISE PROGRAM:  Access Code: [facility]ownURL: [email or web address] Date: 03/24/2022 Prepared by: JRaeford Razor Exercises - Supine Lower Trunk Rotation  - 1 x daily - 7 x weekly - 2 sets - 10 reps - 10 hold - Seated Hamstring Stretch  - 1 x daily - 7 x weekly - 1 sets - 3 reps - 30 hold - Seated Piriformis Stretch  -  1 x daily - 7 x weekly - 1 sets - 3 reps - 20-30 hold - Standing Hip Flexor Stretch  - 1 x daily - 7 x weekly - 1 sets - 3 reps - 20-30 hold - Bridge with Hip Abduction and Resistance  - 1 x daily - 7 x weekly - 2 sets - 10 reps - 5 hold - Clamshell with Resistance  - 1 x daily - 7 x weekly - 2 sets - 10 reps - 5 hold -Hip abduction   ASSESSMENT:   CLINICAL IMPRESSION: Patient reports good tolerance to last session. Repeated standing hip and quad exercises with good tolerance. Progressed lateral hip strength with hip circles, feeling muscle fatigue. FOTO status captured with 57%, improved from 52%. Progressed  lower ab strength with good tolerance.    OBJECTIVE IMPAIRMENTS: decreased activity tolerance, decreased balance, decreased mobility, difficulty walking, decreased ROM, decreased strength, hypomobility, increased fascial restrictions, impaired flexibility, and pain.    ACTIVITY LIMITATIONS: bending, sitting, standing, squatting, sleeping, stairs, transfers, and locomotion level   PARTICIPATION  LIMITATIONS: interpersonal relationship, shopping, and community activity   PERSONAL FACTORS: Time since onset of injury/illness/exacerbation and 3+ comorbidities: polyarthralgia, fibromyalgia, stress  are also affecting patient's functional outcome.    REHAB POTENTIAL: Excellent   CLINICAL DECISION MAKING: Stable/uncomplicated   EVALUATION COMPLEXITY: Low     GOALS: Goals reviewed with patient? Yes   SHORT TERM GOALS: Target date: 04/13/2022     Pt will be I with HEP for hip strength and core/trunk Baseline: Goal status:in progress   2.  Pt will be able to notice increased ease with transfers after sitting short periods (10 min )  Baseline:  Goal status: in progress    3.  Pt will be screened for balance, fall risk and goal set  Baseline:  Goal status: MET      LONG TERM GOALS: Target date: 05/11/2022     Pt will be able to show FOTO score increased to 59%  Baseline: 52% 04/06/22: 57% Goal status: INITIAL   2.  Pt will be able to sit for 20-30 min without increasing L hip pain for working on computer, meals  Baseline: 5-10 min  Goal status: INITIAL   3.  Pt will be able to increase lateral hip strength to 4+/5 or better for stability with gait Baseline: 3+/5 Goal status: INITIAL   4.  Pt will be able to lie on L side for portion of the night with no more than 4 /10 hip pain .  Baseline:  Goal status: INITIAL   5.  Patient will be independent in home exercise program upon discharge Baseline:  Goal status: INITIAL  6. Patient will be able to complete 2 min walk for 480 feet with min dyspnea, no increased hip pain.    Baseline: 431 feet KNEE PAIN   Goal status: INITIAL         PLAN:   PT FREQUENCY: 2x/week   PT DURATION: 8 weeks   PLANNED INTERVENTIONS: Therapeutic exercises, Therapeutic activity, Neuromuscular re-education, Balance training, Gait training, Patient/Family education, Self Care, Joint mobilization, Aquatic Therapy, Dry Needling, Electrical  stimulation, Cryotherapy, Moist heat, Taping, Ionotophoresis '4mg'$ /ml Dexamethasone, Manual therapy, and Re-evaluation   PLAN FOR NEXT SESSION: Check home exercise program hip AROM, core strengthening, dry needling left glute med, modalities if needed, manual.   Raeford Razor, PT 04/06/22 1:45 PM Phone: [phone] Fax: [phone]

## 2022-04-08 ENCOUNTER — Ambulatory Visit: Payer: BC Managed Care – PPO

## 2022-04-08 DIAGNOSIS — M25552 Pain in left hip: Secondary | ICD-10-CM | POA: Diagnosis not present

## 2022-04-08 DIAGNOSIS — M25652 Stiffness of left hip, not elsewhere classified: Secondary | ICD-10-CM

## 2022-04-08 DIAGNOSIS — M6281 Muscle weakness (generalized): Secondary | ICD-10-CM | POA: Diagnosis not present

## 2022-04-08 NOTE — Therapy (Signed)
OUTPATIENT PHYSICAL THERAPY TREATMENT NOTE   Patient Name: Jaime Chapman MRN: [id number] DOB:Apr 06, 1960, 62 y.o., female Today's Date: 04/08/2022  PCP: Jinny Sanders   REFERRING PROVIDER: Donella Stade PA-C  END OF SESSION:   PT End of Session - 04/08/22 1109     Visit Number 8    Number of Visits 16    Date for PT Re-Evaluation 05/11/22    Authorization Type BCBS    PT Start Time 1104    PT Stop Time 1148    PT Time Calculation (min) 44 min    Activity Tolerance Patient tolerated treatment well;Patient limited by pain    Behavior During Therapy WFL for tasks assessed/performed                  Past Medical History:  Diagnosis Date   Allergic rhinitis    Anxiety    Asthma    DDD (degenerative disc disease)    also has spinal arthritis   Depression    Diabetes mellitus    borderline   Dyslipidemia    Fibromyalgia    GERD (gastroesophageal reflux disease)    Hypertension    Lung nodules    RIGHT LUNG--LAST EVALUATED BY CHEST CT 02/01/12 - STABLE AND FOLLOW UP PLANNED IN ONE YEAR.   Lyme disease    Obesity    OSA (obstructive sleep apnea) 05/10/2017   Sinusitis    Stress disorder, acute    Vitamin D deficiency    Past Surgical History:  Procedure Laterality Date   CESAREAN SECTION  1982, 1985   x 2    ENDOMETRIAL ABLATION  2009   HERNIA REPAIR  04/14/12   ventral hernia repair   Laparoscopic ventral hernia repair w/ mesh  08/2009   Dr. Grandville Silos, [facility]   LAPAROSCOPY  2002   TUBAL LIGATION     VENTRAL HERNIA REPAIR  09/2006   Dr. Grandville Silos   VENTRAL HERNIA REPAIR N/A 04/14/2012   Procedure: Laparoscopic Repair of  Ventral Hernia;  Surgeon: Pedro Earls, MD;  Location: [facility];  Service: General;  Laterality: N/A;  Repair of Recurrent Ventral Hernia   Patient Active Problem List   Diagnosis Date Noted   Moderate episode of recurrent major depressive disorder ([place]) 06/03/2020   Attention deficit disorder (ADD) without hyperactivity 03/11/2020    Class 1 drug-induced obesity with serious comorbidity and body mass index (BMI) of 31.0 to 31.9 in adult 09/14/2019   OSA (obstructive sleep apnea) 05/10/2017   Pre-syncope 03/10/2017   Abnormal CT of the chest 08/31/2016   Panic attacks 04/09/2016   Essential hypertension 07/26/2014   Lyme disease 12/13/2013   Constipation - functional 11/20/2013   Hypothyroidism 11/16/2013   H/O ventral hernia repair 05/01/2012   Pulmonary nodules 01/26/2012   Prediabetes 03/04/2011   Fatty liver disease, nonalcoholic 93/26/7124   Generalized anxiety disorder 10/21/2010   VENTRAL HERNIA-twice recurrent 09/08/2009   Vitamin D deficiency 12/27/2008   Hyperlipidemia 05/22/2007   Moderate persistent asthma 05/22/2007   DEGENERATIVE DISC DISEASE 05/22/2007   Fibromyalgia 05/22/2007   Allergic rhinitis 04/14/2007   GERD 04/14/2007    REFERRING DIAG: M25.552 (ICD-10-CM) - Pain in left hip  THERAPY DIAG:  Pain in left hip  Stiffness of left hip, not elsewhere classified  Muscle weakness (generalized)  Rationale for Evaluation and Treatment Rehabilitation  PERTINENT HISTORY: Fibrolyalgia, Rt knee, Rt hip pain .  Bariatric surgery.  Back pain.  See above.   PRECAUTIONS: None  SUBJECTIVE:  SUBJECTIVE STATEMENT: Pt reports her low back has benn Back is a 4/10. Knee is hurting 7/10 but was not as bad last night like usual.    PAIN:  Are you having pain? Yes: NPRS scale: 2/10 Pain location: L hip Pain description: achy Aggravating factors: sitting, overactivity  Relieving factors: heat pad, OTC tylenol, changing position, injection Severe Rt knee pain, 8/10 at times   OBJECTIVE: (objective measures completed at initial evaluation unless otherwise dated)   DIAGNOSTIC FINDINGS:  MRI spine Facet osteoarthritis  at L3-4 and below. No neural impingement to explain leg symptoms.   MRI HIP 01/19/22: 1. Superior labral degeneration with a tear. 2. Mild tendinosis of the left gluteus minimus tendon insertion. 3. Mild tendinosis of the left hamstring origin.   PATIENT SURVEYS:  FOTO 52% FOTO 57% 04/06/22   COGNITION: Overall cognitive status: Within functional limits for tasks assessed                         SENSATION: WFL   EDEMA:  NT   MUSCLE LENGTH: Hamstrings: Good  Thomas test: NT   POSTURE: rounded shoulders, forward head, weight shift left, and genu varus    PALPATION: Tender TTP at left greater trochanter extending to mid thigh.  Pain in gluteus minimus, medius and piriformis muscle with palpation   LOWER EXTREMITY ROM:   Passive ROM Right eval Left eval  Hip flexion WNL WNL  Hip extension      Hip abduction      Hip adduction      Hip internal rotation decreased decreased  Hip external rotation Min Pain Pain   Knee flexion      Knee extension      Ankle dorsiflexion      Ankle plantarflexion      Ankle inversion      Ankle eversion       (Blank rows = not tested)   LOWER EXTREMITY MMT:   MMT Right eval Left eval Lt.  04/01/22  Hip flexion 4/5 4/5   Hip extension       Hip abduction 3+/5 3+/5   Hip adduction       Hip internal rotation       Hip external rotation       Knee flexion 5/5 5/5   Knee extension 4/5 5/5   Ankle dorsiflexion       Ankle plantarflexion       Ankle inversion       Ankle eversion        (Blank rows = not tested)   LOWER EXTREMITY SPECIAL TESTS:  Hip special tests: Saralyn Pilar (FABER) test: negative and Hip scouring test: negative   FUNCTIONAL TESTS:  5 times sit to stand: 12.7 sec  30 seconds chair stand test Timed up and go (TUG): NT 2 minute walk test: NT   GAIT: Distance walked: 100 Assistive device utilized: None Level of assistance: Complete Independence Comments: not Remarkable     TODAY'S TREATMENT:  [facility] Adult PT  Treatment:                                                DATE: 04/08/22 Therapeutic Exercise: PPT x10 3" Supine 90/90 5 sec holds x 10  Bridging on swiss ball 2x10 SL hip abd 2x10 each Supine clams 2x10 Manual Therapy:  Skilled palpation to identify taut muscle bands Trigger Point Dry Needling Treatment: Pre-treatment instruction: Patient instructed on dry needling rationale, procedures, and possible side effects including pain during treatment (achy,cramping feeling), bruising, drop of blood, lightheadedness, nausea, sweating. Patient Consent Given: Yes Education handout provided: Previously provided Muscles treated: Lumbar paraspinals  Needle size and number: .30x42m x 4 L2 and L4 bilat Electrical stimulation performed: Yes Parameters: 256m,intensity as tolerated, 138m. 80uA, intensity as tolerated, 12 mins. Treatment response/outcome: muscle contraction Post-treatment instructions: Patient instructed to expect possible mild to moderate muscle soreness later today and/or tomorrow. Patient instructed in methods to reduce muscle soreness and to continue prescribed HEP. If patient was dry needled over the lung field, patient was instructed on signs and symptoms of pneumothorax and, however unlikely, to see immediate medical attention should they occur. Patient was also educated on signs and symptoms of infection and to seek medical attention should they occur. Patient verbalized understanding of these instructions and education.              [facility] Treatment:                                                DATE: 04/06/22 Therapeutic Exercise: Nustep L5 UE/LE x 5 minutes  Standing wall sit 15 sec x 5 blue band on thighs  Blue banded lateral stepping 3 x 30 feet Banded bridge x 10  with clam , blue R SLR with ER x 10 each - cues for ab brace Hip abduction x 10, hip circles x 10 each way , bilat  Gluteal/ piriformis stretch  Supine 90/90 5 sec holds x 10  Hamstring stretch with strap    [facility] Adult PT Treatment:                                                DATE: 04/01/22 Therapeutic Exercise: NuStep L5 UE and LE for 5 min Standing wall sit 15 sec x 5 blue band on thighs  Blue banded lateral stepping 3 x 30 feet Hip hinge glider lunge used chair in front  Supine banded clam x 15, double and single  Banded bridge x 10  Added march in bridge  Single leg bridge x 10 each side   Piriformis stretch 3x30"   PATIENT EDUCATION:  Education details: PT/POC, bursitis, MRI report, aquatics  Person educated: Patient Education method: ExpTheatre stage managerucation comprehension: verbalized understanding   HOME EXERCISE PROGRAM:  [facility]: [email or web address] Date: 03/24/2022 Prepared by: JenRaeford Razorxercises - Supine Lower Trunk Rotation  - 1 x daily - 7 x weekly - 2 sets - 10 reps - 10 hold - Seated Hamstring Stretch  - 1 x daily - 7 x weekly - 1 sets - 3 reps - 30 hold - Seated Piriformis Stretch  - 1 x daily - 7 x weekly - 1 sets - 3 reps - 20-30 hold - Standing Hip Flexor Stretch  - 1 x daily - 7 x weekly - 1 sets - 3 reps - 20-30 hold - Bridge with Hip Abduction and Resistance  - 1 x daily - 7 x weekly - 2 sets - 10 reps - 5 hold - Clamshell with  Resistance  - 1 x daily - 7 x weekly - 2 sets - 10 reps - 5 hold -Hip abduction   ASSESSMENT:   CLINICAL IMPRESSION: PT was completed to address LBP per TPDN with estim as noted above to the lumbar paraspinals. Pt then completed lumbopelvic strengthening therex for muscle activation. Initially after the session, the pt reported 0/10 LBP. Will assess pt's long tern response to today's session on her next visit. Pt tolerated PT today without adverse effect.  OBJECTIVE IMPAIRMENTS: decreased activity tolerance, decreased balance, decreased mobility, difficulty walking, decreased ROM, decreased strength, hypomobility, increased fascial restrictions, impaired flexibility, and pain.     ACTIVITY LIMITATIONS: bending, sitting, standing, squatting, sleeping, stairs, transfers, and locomotion level   PARTICIPATION LIMITATIONS: interpersonal relationship, shopping, and community activity   PERSONAL FACTORS: Time since onset of injury/illness/exacerbation and 3+ comorbidities: polyarthralgia, fibromyalgia, stress  are also affecting patient's functional outcome.    REHAB POTENTIAL: Excellent   CLINICAL DECISION MAKING: Stable/uncomplicated   EVALUATION COMPLEXITY: Low     GOALS: Goals reviewed with patient? Yes   SHORT TERM GOALS: Target date: 04/13/2022     Pt will be I with HEP for hip strength and core/trunk Baseline: Goal status:in progress   2.  Pt will be able to notice increased ease with transfers after sitting short periods (10 min )  Baseline:  Goal status: in progress    3.  Pt will be screened for balance, fall risk and goal set  Baseline:  Goal status: MET      LONG TERM GOALS: Target date: 05/11/2022     Pt will be able to show FOTO score increased to 59%  Baseline: 52% 04/06/22: 57% Goal status: INITIAL   2.  Pt will be able to sit for 20-30 min without increasing L hip pain for working on computer, meals  Baseline: 5-10 min  Goal status: INITIAL   3.  Pt will be able to increase lateral hip strength to 4+/5 or better for stability with gait Baseline: 3+/5 Goal status: INITIAL   4.  Pt will be able to lie on L side for portion of the night with no more than 4 /10 hip pain .  Baseline:  Goal status: INITIAL   5.  Patient will be independent in home exercise program upon discharge Baseline:  Goal status: INITIAL  6. Patient will be able to complete 2 min walk for 480 feet with min dyspnea, no increased hip pain.    Baseline: 431 feet KNEE PAIN   Goal status: INITIAL         PLAN:   PT FREQUENCY: 2x/week   PT DURATION: 8 weeks   PLANNED INTERVENTIONS: Therapeutic exercises, Therapeutic activity, Neuromuscular re-education,  Balance training, Gait training, Patient/Family education, Self Care, Joint mobilization, Aquatic Therapy, Dry Needling, Electrical stimulation, Cryotherapy, Moist heat, Taping, Ionotophoresis '4mg'$ /ml Dexamethasone, Manual therapy, and Re-evaluation   PLAN FOR NEXT SESSION: Check home exercise program hip AROM, core strengthening, dry needling left glute med, modalities if needed, manual.   Gar Ponto MS, PT 04/08/22 11:42 AM

## 2022-04-12 NOTE — Therapy (Unsigned)
OUTPATIENT PHYSICAL THERAPY TREATMENT NOTE   Patient Name: Jaime Chapman MRN: [id number] DOB:December 30, 1960, 62 y.o., female Today's Date: 04/13/2022  PCP: Jinny Sanders   REFERRING PROVIDER: Donella Stade PA-C  END OF SESSION:   PT End of Session - 04/13/22 1102     Visit Number 9    Number of Visits 16    Date for PT Re-Evaluation 05/11/22    Authorization Type BCBS    PT Start Time 1103    PT Stop Time 1145    PT Time Calculation (min) 42 min    Activity Tolerance Patient tolerated treatment well;Patient limited by pain    Behavior During Therapy WFL for tasks assessed/performed                   Past Medical History:  Diagnosis Date   Allergic rhinitis    Anxiety    Asthma    DDD (degenerative disc disease)    also has spinal arthritis   Depression    Diabetes mellitus    borderline   Dyslipidemia    Fibromyalgia    GERD (gastroesophageal reflux disease)    Hypertension    Lung nodules    RIGHT LUNG--LAST EVALUATED BY CHEST CT 02/01/12 - STABLE AND FOLLOW UP PLANNED IN ONE YEAR.   Lyme disease    Obesity    OSA (obstructive sleep apnea) 05/10/2017   Sinusitis    Stress disorder, acute    Vitamin D deficiency    Past Surgical History:  Procedure Laterality Date   CESAREAN SECTION  1982, 1985   x 2    ENDOMETRIAL ABLATION  2009   HERNIA REPAIR  04/14/12   ventral hernia repair   Laparoscopic ventral hernia repair w/ mesh  08/2009   Dr. Grandville Silos, [facility]   LAPAROSCOPY  2002   TUBAL LIGATION     VENTRAL HERNIA REPAIR  09/2006   Dr. Grandville Silos   VENTRAL HERNIA REPAIR N/A 04/14/2012   Procedure: Laparoscopic Repair of  Ventral Hernia;  Surgeon: Pedro Earls, MD;  Location: [facility];  Service: General;  Laterality: N/A;  Repair of Recurrent Ventral Hernia   Patient Active Problem List   Diagnosis Date Noted   Moderate episode of recurrent major depressive disorder ([place]) 06/03/2020   Attention deficit disorder (ADD) without hyperactivity  03/11/2020   Class 1 drug-induced obesity with serious comorbidity and body mass index (BMI) of 31.0 to 31.9 in adult 09/14/2019   OSA (obstructive sleep apnea) 05/10/2017   Pre-syncope 03/10/2017   Abnormal CT of the chest 08/31/2016   Panic attacks 04/09/2016   Essential hypertension 07/26/2014   Lyme disease 12/13/2013   Constipation - functional 11/20/2013   Hypothyroidism 11/16/2013   H/O ventral hernia repair 05/01/2012   Pulmonary nodules 01/26/2012   Prediabetes 03/04/2011   Fatty liver disease, nonalcoholic [id number]   Generalized anxiety disorder 10/21/2010   VENTRAL HERNIA-twice recurrent 09/08/2009   Vitamin D deficiency 12/27/2008   Hyperlipidemia 05/22/2007   Moderate persistent asthma 05/22/2007   DEGENERATIVE DISC DISEASE 05/22/2007   Fibromyalgia 05/22/2007   Allergic rhinitis 04/14/2007   GERD 04/14/2007    REFERRING DIAG: M25.552 (ICD-10-CM) - Pain in left hip  THERAPY DIAG:  Pain in left hip  Stiffness of left hip, not elsewhere classified  Muscle weakness (generalized)  Rationale for Evaluation and Treatment Rehabilitation  PERTINENT HISTORY: Fibrolyalgia, Rt knee, Rt hip pain .  Bariatric surgery.  Back pain.  See above.   PRECAUTIONS: None  SUBJECTIVE:  SUBJECTIVE STATEMENT: Back was sore after needling but it is way better.  She has a knee appt 2/28 with Dr. Maureen Ralphs . Hip is barely hurting at all.    PAIN:  Are you having pain? Yes: NPRS scale: none/10 Pain location: L hip Pain description: achy Aggravating factors: sitting, overactivity  Relieving factors: heat pad, OTC tylenol, changing position, injection Severe Rt knee pain, 8/10 at times   04/13/22: Back is 1/10, knee is 5/10.  OBJECTIVE: (objective measures completed at initial evaluation unless otherwise  dated)   DIAGNOSTIC FINDINGS:  MRI spine Facet osteoarthritis at L3-4 and below. No neural impingement to explain leg symptoms.   MRI HIP 01/19/22: 1. Superior labral degeneration with a tear. 2. Mild tendinosis of the left gluteus minimus tendon insertion. 3. Mild tendinosis of the left hamstring origin.   PATIENT SURVEYS:  FOTO 52% FOTO 57% 04/06/22   COGNITION: Overall cognitive status: Within functional limits for tasks assessed                         SENSATION: WFL   EDEMA:  NT   MUSCLE LENGTH: Hamstrings: Good  Thomas test: NT   POSTURE: rounded shoulders, forward head, weight shift left, and genu varus    PALPATION: Tender TTP at left greater trochanter extending to mid thigh.  Pain in gluteus minimus, medius and piriformis muscle with palpation   LOWER EXTREMITY ROM:   Passive ROM Right eval Left eval  Hip flexion WNL WNL  Hip extension      Hip abduction      Hip adduction      Hip internal rotation decreased decreased  Hip external rotation Min Pain Pain   Knee flexion      Knee extension      Ankle dorsiflexion      Ankle plantarflexion      Ankle inversion      Ankle eversion       (Blank rows = not tested)   LOWER EXTREMITY MMT:   MMT Right eval Left eval Lt.  04/01/22  Hip flexion 4/5 4/5   Hip extension       Hip abduction 3+/5 3+/5   Hip adduction       Hip internal rotation       Hip external rotation       Knee flexion 5/5 5/5   Knee extension 4/5 5/5   Ankle dorsiflexion       Ankle plantarflexion       Ankle inversion       Ankle eversion        (Blank rows = not tested)   LOWER EXTREMITY SPECIAL TESTS:  Hip special tests: Saralyn Pilar (FABER) test: negative and Hip scouring test: negative   FUNCTIONAL TESTS:  5 times sit to stand: 12.7 sec  30 seconds chair stand test Timed up and go (TUG): NT 2 minute walk test: NT   GAIT: Distance walked: 100 Assistive device utilized: None Level of assistance: Complete  Independence Comments: not Remarkable     TODAY'S TREATMENT:   [facility] Adult PT Treatment:                                                DATE: 04/13/22 Therapeutic Exercise: Supine band exercises: clam double leg and single leg green band  Tr A  x 10  PPT to bridge  Bridge with march and clam  Sidelying clam x 15 green band  Sidelying plank knees bent on forearms 3 x 15 sec  Wall x 3 , 30 sec   RDL x 15 , 15 lbs , then 25 lbs  Stagger RDL x 15 lbs each LE x  15 , min cues  Leg press 2 plates x 10 , 2 sets , slightly wider 2nd set  [facility] Adult PT Treatment:                                                DATE: 04/08/22 Therapeutic Exercise: PPT x10 3" Supine 90/90 5 sec holds x 10  Bridging on swiss ball 2x10 SL hip abd 2x10 each Supine clams 2x10 Manual Therapy: Skilled palpation to identify taut muscle bands Trigger Point Dry Needling Treatment: Pre-treatment instruction: Patient instructed on dry needling rationale, procedures, and possible side effects including pain during treatment (achy,cramping feeling), bruising, drop of blood, lightheadedness, nausea, sweating. Patient Consent Given: Yes Education handout provided: Previously provided Muscles treated: Lumbar paraspinals  Needle size and number: .30x104m x 4 L2 and L4 bilat Electrical stimulation performed: Yes Parameters: 239m,intensity as tolerated, 1367m. 80uA, intensity as tolerated, 12 mins. Treatment response/outcome: muscle contraction Post-treatment instructions: Patient instructed to expect possible mild to moderate muscle soreness later today and/or tomorrow. Patient instructed in methods to reduce muscle soreness and to continue prescribed HEP. If patient was dry needled over the lung field, patient was instructed on signs and symptoms of pneumothorax and, however unlikely, to see immediate medical attention should they occur. Patient was also educated on signs and symptoms of infection and to seek medical attention  should they occur. Patient verbalized understanding of these instructions and education.              [facility] Treatment:                                                DATE: 04/06/22 Therapeutic Exercise: Nustep L5 UE/LE x 5 minutes  Standing wall sit 15 sec x 5 blue band on thighs  Blue banded lateral stepping 3 x 30 feet Banded bridge x 10  with clam , blue R SLR with ER x 10 each - cues for ab brace Hip abduction x 10, hip circles x 10 each way , bilat  Gluteal/ piriformis stretch  Supine 90/90 5 sec holds x 10  Hamstring stretch with strap   [facility] Adult PT Treatment:                                                DATE: 04/01/22 Therapeutic Exercise: NuStep L5 UE and LE for 5 min Standing wall sit 15 sec x 5 blue band on thighs  Blue banded lateral stepping 3 x 30 feet Hip hinge glider lunge used chair in front  Supine banded clam x 15, double and single  Banded bridge x 10  Added march in bridge  Single leg bridge x 10 each side   Piriformis stretch 3x30"  PATIENT EDUCATION:  Education details: PT/POC, bursitis, MRI report, aquatics  Person educated: Patient Education method: Theatre stage manager Education comprehension: verbalized understanding   HOME EXERCISE PROGRAM:  Access Code: [facility] URL: [email or web address] Date: 03/24/2022 Prepared by: Raeford Razor  Exercises - Supine Lower Trunk Rotation  - 1 x daily - 7 x weekly - 2 sets - 10 reps - 10 hold - Seated Hamstring Stretch  - 1 x daily - 7 x weekly - 1 sets - 3 reps - 30 hold - Seated Piriformis Stretch  - 1 x daily - 7 x weekly - 1 sets - 3 reps - 20-30 hold - Standing Hip Flexor Stretch  - 1 x daily - 7 x weekly - 1 sets - 3 reps - 20-30 hold - Bridge with Hip Abduction and Resistance  - 1 x daily - 7 x weekly - 2 sets - 10 reps - 5 hold - Clamshell with Resistance  - 1 x daily - 7 x weekly - 2 sets - 10 reps - 5 hold -Hip abduction   ASSESSMENT:   CLINICAL IMPRESSION: Patient  tolerated exercises well today.  She is feeling much better after her dry needling session.  She was able to perform deadlifts with very minimal cues and felt appropriate glutes activation with these.  Patient does have a membership to [employer] I encouraged her to try going both for her physical and mental health.  She is limited by knee pain but really has none today.  Cont POC.    OBJECTIVE IMPAIRMENTS: decreased activity tolerance, decreased balance, decreased mobility, difficulty walking, decreased ROM, decreased strength, hypomobility, increased fascial restrictions, impaired flexibility, and pain.    ACTIVITY LIMITATIONS: bending, sitting, standing, squatting, sleeping, stairs, transfers, and locomotion level   PARTICIPATION LIMITATIONS: interpersonal relationship, shopping, and community activity   PERSONAL FACTORS: Time since onset of injury/illness/exacerbation and 3+ comorbidities: polyarthralgia, fibromyalgia, stress  are also affecting patient's functional outcome.    REHAB POTENTIAL: Excellent   CLINICAL DECISION MAKING: Stable/uncomplicated   EVALUATION COMPLEXITY: Low     GOALS: Goals reviewed with patient? Yes   SHORT TERM GOALS: Target date: 04/13/2022     Pt will be I with HEP for hip strength and core/trunk Baseline: Goal status:in progress   2.  Pt will be able to notice increased ease with transfers after sitting short periods (10 min )  Baseline:  Goal status: in progress    3.  Pt will be screened for balance, fall risk and goal set  Baseline:  Goal status: MET      LONG TERM GOALS: Target date: 05/11/2022     Pt will be able to show FOTO score increased to 59%  Baseline: 52% 04/06/22: 57% Goal status: INITIAL   2.  Pt will be able to sit for 20-30 min without increasing L hip pain for working on computer, meals  Baseline: 5-10 min  Goal status: INITIAL   3.  Pt will be able to increase lateral hip strength to 4+/5 or better for stability with  gait Baseline: 3+/5 Goal status: INITIAL   4.  Pt will be able to lie on L side for portion of the night with no more than 4 /10 hip pain .  Baseline:  Goal status: INITIAL   5.  Patient will be independent in home exercise program upon discharge Baseline:  Goal status: INITIAL  6. Patient will be able to complete 2 min walk for 480 feet with min dyspnea, no increased  hip pain.    Baseline: 431 feet KNEE PAIN   Goal status: INITIAL         PLAN:   PT FREQUENCY: 2x/week   PT DURATION: 8 weeks   PLANNED INTERVENTIONS: Therapeutic exercises, Therapeutic activity, Neuromuscular re-education, Balance training, Gait training, Patient/Family education, Self Care, Joint mobilization, Aquatic Therapy, Dry Needling, Electrical stimulation, Cryotherapy, Moist heat, Taping, Ionotophoresis 8m/ml Dexamethasone, Manual therapy, and Re-evaluation   PLAN FOR NEXT SESSION: Check home exercise program hip AROM, core strengthening, dry needling left glute med, modalities if needed, manual.   AGar PontoMS, PT 04/13/22 11:04 AM

## 2022-04-13 ENCOUNTER — Encounter: Payer: Self-pay | Admitting: Physical Therapy

## 2022-04-13 ENCOUNTER — Ambulatory Visit: Payer: BC Managed Care – PPO | Admitting: Physical Therapy

## 2022-04-13 DIAGNOSIS — M25652 Stiffness of left hip, not elsewhere classified: Secondary | ICD-10-CM

## 2022-04-13 DIAGNOSIS — M25552 Pain in left hip: Secondary | ICD-10-CM | POA: Diagnosis not present

## 2022-04-13 DIAGNOSIS — M6281 Muscle weakness (generalized): Secondary | ICD-10-CM | POA: Diagnosis not present

## 2022-04-14 NOTE — Therapy (Unsigned)
OUTPATIENT PHYSICAL THERAPY TREATMENT NOTE   Patient Name: Jaime Chapman MRN: [id number] DOB:16-Jun-1960, 62 y.o., female Today's Date: 04/15/2022  PCP: Jinny Sanders   REFERRING PROVIDER: Donella Stade PA-C  END OF SESSION:   PT End of Session - 04/15/22 1106     Visit Number 10    Number of Visits 16    Date for PT Re-Evaluation 05/11/22    Authorization Type [employer]    PT Start Time 1104    PT Stop Time 1149    PT Time Calculation (min) 45 min             Progress Note Reporting Period 03/16/22 to 04/15/22  See note below for Objective Data and Assessment of Progress/Goals.           Past Medical History:  Diagnosis Date   Allergic rhinitis    Anxiety    Asthma    DDD (degenerative disc disease)    also has spinal arthritis   Depression    Diabetes mellitus    borderline   Dyslipidemia    Fibromyalgia    GERD (gastroesophageal reflux disease)    Hypertension    Lung nodules    RIGHT LUNG--LAST EVALUATED BY CHEST CT 02/01/12 - STABLE AND FOLLOW UP PLANNED IN ONE YEAR.   Lyme disease    Obesity    OSA (obstructive sleep apnea) 05/10/2017   Sinusitis    Stress disorder, acute    Vitamin D deficiency    Past Surgical History:  Procedure Laterality Date   CESAREAN SECTION  1982, 1985   x 2    ENDOMETRIAL ABLATION  2009   HERNIA REPAIR  04/14/12   ventral hernia repair   Laparoscopic ventral hernia repair w/ mesh  08/2009   Dr. Grandville Silos, [facility]   LAPAROSCOPY  2002   TUBAL LIGATION     VENTRAL HERNIA REPAIR  09/2006   Dr. Grandville Silos   VENTRAL HERNIA REPAIR N/A 04/14/2012   Procedure: Laparoscopic Repair of  Ventral Hernia;  Surgeon: Pedro Earls, MD;  Location: [facility];  Service: General;  Laterality: N/A;  Repair of Recurrent Ventral Hernia   Patient Active Problem List   Diagnosis Date Noted   Moderate episode of recurrent major depressive disorder ([place]) 06/03/2020   Attention deficit disorder (ADD) without hyperactivity 03/11/2020   Class  1 drug-induced obesity with serious comorbidity and body mass index (BMI) of 31.0 to 31.9 in adult 09/14/2019   OSA (obstructive sleep apnea) 05/10/2017   Pre-syncope 03/10/2017   Abnormal CT of the chest 08/31/2016   Panic attacks 04/09/2016   Essential hypertension 07/26/2014   Lyme disease 12/13/2013   Constipation - functional 11/20/2013   Hypothyroidism 11/16/2013   H/O ventral hernia repair 05/01/2012   Pulmonary nodules 01/26/2012   Prediabetes 03/04/2011   Fatty liver disease, nonalcoholic [id number]   Generalized anxiety disorder 10/21/2010   VENTRAL HERNIA-twice recurrent 09/08/2009   Vitamin D deficiency 12/27/2008   Hyperlipidemia 05/22/2007   Moderate persistent asthma 05/22/2007   DEGENERATIVE DISC DISEASE 05/22/2007   Fibromyalgia 05/22/2007   Allergic rhinitis 04/14/2007   GERD 04/14/2007    REFERRING DIAG: M25.552 (ICD-10-CM) - Pain in left hip  THERAPY DIAG:  Pain in left hip  Stiffness of left hip, not elsewhere classified  Muscle weakness (generalized)  Rationale for Evaluation and Treatment Rehabilitation  PERTINENT HISTORY: Fibrolyalgia, Rt knee, Rt hip pain .  Bariatric surgery.  Back pain.  See above.   PRECAUTIONS: None  SUBJECTIVE:  SUBJECTIVE STATEMENT: I am OK.  No pain in Lt hip.  Back has a catch in it.    PAIN:  Are you having pain? Yes: NPRS scale: none/10 Pain location: L hip Pain description: achy Aggravating factors: sitting, overactivity  Relieving factors: heat pad, OTC tylenol, changing position, injection Severe Rt knee pain, 8/10 at times   04/15/22: Back is 4/10, knee is 5/10.    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 No  neural impingement to explain leg symptoms.   MRI HIP 01/19/22: 1. Superior labral degeneration with a tear. 2. Mild tendinosis of the left gluteus minimus tendon insertion. 3. Mild tendinosis of the left hamstring origin.   PATIENT SURVEYS:  FOTO 52% FOTO 57% 04/06/22   COGNITION: Overall cognitive status: Within functional limits for tasks assessed                         SENSATION: WFL   EDEMA:  NT   MUSCLE LENGTH: Hamstrings: Good  Thomas test: NT   POSTURE: rounded shoulders, forward head, weight shift left, and genu varus    PALPATION: Tender TTP at left greater trochanter extending to mid thigh.  Pain in gluteus minimus, medius and piriformis muscle with palpation   LOWER EXTREMITY ROM:   Passive ROM Right eval Left eval  Hip flexion WNL WNL  Hip extension      Hip abduction      Hip adduction      Hip internal rotation decreased decreased  Hip external rotation Min Pain Pain   Knee flexion      Knee extension      Ankle dorsiflexion      Ankle plantarflexion      Ankle inversion      Ankle eversion       (Blank rows = not tested)   LOWER EXTREMITY MMT:   MMT Right eval Left eval Rt./Lt.  04/15/22  Hip flexion 4/5 4/5 4+/5  Hip extension       Hip abduction 3+/5 3+/5 4/5  Hip adduction       Hip internal rotation       Hip external rotation       Knee flexion 5/5 5/5   Knee extension 4/5 5/5   Ankle dorsiflexion       Ankle plantarflexion       Ankle inversion       Ankle eversion        (Blank rows = not tested)   LOWER EXTREMITY SPECIAL TESTS:  Hip special tests: Saralyn Pilar (FABER) test: negative and Hip scouring test: negative   FUNCTIONAL TESTS:  5 times sit to stand: 12.7 sec  30 seconds chair stand test Timed up and go (TUG): NT 2 minute walk test: NT   GAIT: Distance walked: 100 Assistive device utilized: None Level of assistance: Complete Independence Comments: not Remarkable     TODAY'S TREATMENT:    [facility] Adult PT  Treatment:                                                DATE: 04/15/22 Therapeutic Exercise: Sidelying hip abduction x 15  Sidelying clam x 15  Abduction hold with knee extension  Upper trunk rotation x 5  Hamstring, ITB stretch with strap 30 sec  Core stability in supine: ball under pelvis  March , bent knee fall out , overhead lift using Pilates circle, isometric circle press with March Core stability in standing: hinge with 10 lbs KB pass (lateral) KB circle pass 1 min x 2 (feet in tandem)  Wall sit 60 sec with 10 lbs KB   [facility] Adult PT Treatment:                                                DATE: 04/13/22 Therapeutic Exercise: Supine band exercises: clam double leg and single leg green band  Tr A  x 10  PPT to bridge  Bridge with march and clam  Sidelying clam x 15 green band  Sidelying plank knees bent on forearms 3 x 15 sec  Wall x 3 , 30 sec   RDL x 15 , 15 lbs , then 25 lbs  Stagger RDL x 15 lbs each LE x  15 , min cues  Leg press 2 plates x 10 , 2 sets , slightly wider 2nd set   [facility] Adult PT Treatment:                                                DATE: 04/08/22 Therapeutic Exercise: PPT x10 3" Supine 90/90 5 sec holds x 10  Bridging on swiss ball 2x10 SL hip abd 2x10 each Supine clams 2x10 Manual Therapy: Skilled palpation to identify taut muscle bands Trigger Point Dry Needling Treatment: Pre-treatment instruction: Patient instructed on dry needling rationale, procedures, and possible side effects including pain during treatment (achy,cramping feeling), bruising, drop of blood, lightheadedness, nausea, sweating. Patient Consent Given: Yes Education handout provided: Previously provided Muscles treated: Lumbar paraspinals  Needle size and number: .30x74m x 4 L2 and L4 bilat Electrical stimulation performed: Yes Parameters: 211m,intensity as tolerated, 1376m. 80uA, intensity as tolerated, 12 mins. Treatment response/outcome: muscle contraction Post-treatment  instructions: Patient instructed to expect possible mild to moderate muscle soreness later today and/or tomorrow. Patient instructed in methods to reduce muscle soreness and to continue prescribed HEP. If patient was dry needled over the lung field, patient was instructed on signs and symptoms of pneumothorax and, however unlikely, to see immediate medical attention should they occur. Patient was also educated on signs and symptoms of infection and to seek medical attention should they occur. Patient verbalized understanding of these instructions and education.              PATIENT EDUCATION:  Education details: PT/POC, bursitis, MRI report, aquatics  Person educated: Patient Education method: ExpTheatre stage managerucation comprehension: verbalized understanding   HOME EXERCISE PROGRAM:  Access [facility]: [email or web address] Date: 03/24/2022 Prepared by: JenRaeford Razorxercises - Supine Lower Trunk Rotation  - 1 x daily - 7 x weekly - 2 sets - 10 reps - 10 hold - Seated Hamstring Stretch  - 1 x daily - 7 x weekly - 1 sets - 3 reps - 30 hold - Seated Piriformis Stretch  - 1 x daily - 7 x weekly - 1 sets - 3 reps - 20-30 hold - Standing Hip Flexor Stretch  - 1 x daily - 7 x weekly -  1 sets - 3 reps - 20-30 hold - Bridge with Hip Abduction and Resistance  - 1 x daily - 7 x weekly - 2 sets - 10 reps - 5 hold - Clamshell with Resistance  - 1 x daily - 7 x weekly - 2 sets - 10 reps - 5 hold -Hip abduction   ASSESSMENT:   CLINICAL IMPRESSION: Focus today on core strength and stability using Pilates props.  She needs moderate cueing to engage abdominals and stabilize throughout the session.  She does exhibit increased lateral hip strength.  Overall she has excellent hip mobility but does need to work on stability.  Continue POC. Goals in progress.   OBJECTIVE IMPAIRMENTS: decreased activity tolerance, decreased balance, decreased mobility, difficulty walking,  decreased ROM, decreased strength, hypomobility, increased fascial restrictions, impaired flexibility, and pain.    ACTIVITY LIMITATIONS: bending, sitting, standing, squatting, sleeping, stairs, transfers, and locomotion level   PARTICIPATION LIMITATIONS: interpersonal relationship, shopping, and community activity   PERSONAL FACTORS: Time since onset of injury/illness/exacerbation and 3+ comorbidities: polyarthralgia, fibromyalgia, stress  are also affecting patient's functional outcome.    REHAB POTENTIAL: Excellent   CLINICAL DECISION MAKING: Stable/uncomplicated   EVALUATION COMPLEXITY: Low     GOALS: Goals reviewed with patient? Yes   SHORT TERM GOALS: Target date: 04/13/2022     Pt will be I with HEP for hip strength and core/trunk Baseline: Goal status: MET    2.  Pt will be able to notice increased ease with transfers after sitting short periods (10 min )  Baseline: 04/15/22  knee stiffness  Goal status: MET   3.  Pt will be screened for balance, fall risk and goal set  Baseline:  Goal status: MET      LONG TERM GOALS: Target date: 05/11/2022     Pt will be able to show FOTO score increased to 59%  Baseline: 52% 04/06/22: 57% Goal status: ongoing    2.  Pt will be able to sit for 20-30 min without increasing L hip pain for working on computer, meals  Baseline: 5-10 min , 04/15/22: improved  Goal status: ongoing    3.  Pt will be able to increase lateral hip strength to 4+/5 or better for stability with gait Baseline: 3+/5, 04/15/22: 4/5 Goal status: ongoing    4.  Pt will be able to lie on L side for portion of the night with no more than 4 /10 hip pain .  Baseline: 04/15/22, can do this about 30% better than previous Goal status:ongoing    5.  Patient will be independent in home exercise program upon discharge Baseline:  Goal status: ongoing   6. Patient will be able to complete 2 min walk for 480 feet with min dyspnea, no increased hip pain.    Baseline:  431 feet KNEE PAIN   Goal status: INITIAL         PLAN:   PT FREQUENCY: 2x/week   PT DURATION: 8 weeks   PLANNED INTERVENTIONS: Therapeutic exercises, Therapeutic activity, Neuromuscular re-education, Balance training, Gait training, Patient/Family education, Self Care, Joint mobilization, Aquatic Therapy, Dry Needling, Electrical stimulation, Cryotherapy, Moist heat, Taping, Ionotophoresis 18m/ml Dexamethasone, Manual therapy, and Re-evaluation   PLAN FOR NEXT SESSION: Check home exercise program hip AROM, core strengthening, dry needling left glute med, modalities if needed, manual.

## 2022-04-15 ENCOUNTER — Encounter: Payer: Self-pay | Admitting: Physical Therapy

## 2022-04-15 ENCOUNTER — Ambulatory Visit: Payer: BC Managed Care – PPO | Admitting: Physical Therapy

## 2022-04-15 DIAGNOSIS — M25652 Stiffness of left hip, not elsewhere classified: Secondary | ICD-10-CM | POA: Diagnosis not present

## 2022-04-15 DIAGNOSIS — M25552 Pain in left hip: Secondary | ICD-10-CM

## 2022-04-15 DIAGNOSIS — M6281 Muscle weakness (generalized): Secondary | ICD-10-CM

## 2022-04-20 ENCOUNTER — Ambulatory Visit: Payer: BC Managed Care – PPO

## 2022-04-20 NOTE — Therapy (Incomplete)
OUTPATIENT PHYSICAL THERAPY TREATMENT NOTE   Patient Name: Jaime Chapman MRN: [id number] DOB:[birth year], 61 y.o., female Today's Date: 04/20/2022  PCP: Jinny Sanders   REFERRING PROVIDER: Donella Stade PA-C  END OF SESSION:     Progress Note Reporting Period 03/16/22 to 04/15/22  See note below for Objective Data and Assessment of Progress/Goals.           Past Medical History:  Diagnosis Date   Allergic rhinitis    Anxiety    Asthma    DDD (degenerative disc disease)    also has spinal arthritis   Depression    Diabetes mellitus    borderline   Dyslipidemia    Fibromyalgia    GERD (gastroesophageal reflux disease)    Hypertension    Lung nodules    RIGHT LUNG--LAST EVALUATED BY CHEST CT 02/01/12 - STABLE AND FOLLOW UP PLANNED IN ONE YEAR.   Lyme disease    Obesity    OSA (obstructive sleep apnea) 05/10/2017   Sinusitis    Stress disorder, acute    Vitamin D deficiency    Past Surgical History:  Procedure Laterality Date   CESAREAN SECTION  1982, 1985   x 2    ENDOMETRIAL ABLATION  2009   HERNIA REPAIR  04/14/12   ventral hernia repair   Laparoscopic ventral hernia repair w/ mesh  08/2009   Dr. Grandville Silos, [facility]   LAPAROSCOPY  2002   TUBAL LIGATION     VENTRAL HERNIA REPAIR  09/2006   Dr. Grandville Silos   VENTRAL HERNIA REPAIR N/A 04/14/2012   Procedure: Laparoscopic Repair of  Ventral Hernia;  Surgeon: Pedro Earls, MD;  Location: [facility];  Service: General;  Laterality: N/A;  Repair of Recurrent Ventral Hernia   Patient Active Problem List   Diagnosis Date Noted   Moderate episode of recurrent major depressive disorder ([place]) 06/03/2020   Attention deficit disorder (ADD) without hyperactivity 03/11/2020   Class 1 drug-induced obesity with serious comorbidity and body mass index (BMI) of 31.0 to 31.9 in adult 09/14/2019   OSA (obstructive sleep apnea) 05/10/2017   Pre-syncope 03/10/2017   Abnormal CT of the chest 08/31/2016   Panic attacks  04/09/2016   Essential hypertension 07/26/2014   Lyme disease 12/13/2013   Constipation - functional 11/20/2013   Hypothyroidism 11/16/2013   H/O ventral hernia repair 05/01/2012   Pulmonary nodules 01/26/2012   Prediabetes 03/04/2011   Fatty liver disease, nonalcoholic [id number]   Generalized anxiety disorder 10/21/2010   VENTRAL HERNIA-twice recurrent 09/08/2009   Vitamin D deficiency 12/27/2008   Hyperlipidemia 05/22/2007   Moderate persistent asthma 05/22/2007   DEGENERATIVE DISC DISEASE 05/22/2007   Fibromyalgia 05/22/2007   Allergic rhinitis 04/14/2007   GERD 04/14/2007    REFERRING DIAG: M25.552 (ICD-10-CM) - Pain in left hip  THERAPY DIAG:  No diagnosis found.  Rationale for Evaluation and Treatment Rehabilitation  PERTINENT HISTORY: Fibrolyalgia, Rt knee, Rt hip pain .  Bariatric surgery.  Back pain.  See above.   PRECAUTIONS: None  SUBJECTIVE:  SUBJECTIVE STATEMENT: I am OK.  No pain in Lt hip.  Back has a catch in it.    PAIN:  Are you having pain? Yes: NPRS scale: none/10 Pain location: L hip Pain description: achy Aggravating factors: sitting, overactivity  Relieving factors: heat pad, OTC tylenol, changing position, injection Severe Rt knee pain, 8/10 at times   04/15/22: Back is 4/10, knee is 5/10.    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 No neural impingement to explain leg symptoms.   MRI HIP 01/19/22: 1. Superior labral degeneration with a tear. 2. Mild tendinosis of the left gluteus minimus tendon insertion. 3. Mild tendinosis of the left hamstring origin.   PATIENT SURVEYS:  FOTO 52% FOTO 57% 04/06/22   COGNITION: Overall cognitive  status: Within functional limits for tasks assessed                         SENSATION: WFL   EDEMA:  NT   MUSCLE LENGTH: Hamstrings: Good  Thomas test: NT   POSTURE: rounded shoulders, forward head, weight shift left, and genu varus    PALPATION: Tender TTP at left greater trochanter extending to mid thigh.  Pain in gluteus minimus, medius and piriformis muscle with palpation   LOWER EXTREMITY ROM:   Passive ROM Right eval Left eval  Hip flexion WNL WNL  Hip extension      Hip abduction      Hip adduction      Hip internal rotation decreased decreased  Hip external rotation Min Pain Pain   Knee flexion      Knee extension      Ankle dorsiflexion      Ankle plantarflexion      Ankle inversion      Ankle eversion       (Blank rows = not tested)   LOWER EXTREMITY MMT:   MMT Right eval Left eval Rt./Lt.  04/15/22  Hip flexion 4/5 4/5 4+/5  Hip extension       Hip abduction 3+/5 3+/5 4/5  Hip adduction       Hip internal rotation       Hip external rotation       Knee flexion 5/5 5/5   Knee extension 4/5 5/5   Ankle dorsiflexion       Ankle plantarflexion       Ankle inversion       Ankle eversion        (Blank rows = not tested)   LOWER EXTREMITY SPECIAL TESTS:  Hip special tests: Saralyn Pilar (FABER) test: negative and Hip scouring test: negative   FUNCTIONAL TESTS:  5 times sit to stand: 12.7 sec  30 seconds chair stand test Timed up and go (TUG): NT 2 minute walk test: NT   GAIT: Distance walked: 100 Assistive device utilized: None Level of assistance: Complete Independence Comments: not Remarkable     TODAY'S TREATMENT:  [facility] Adult PT Treatment:                                                DATE: 04/20/22 Therapeutic Exercise: *** Manual Therapy: *** Neuromuscular re-ed: *** Therapeutic Activity: *** Modalities: *** Self Care: [facility] Adult PT Treatment:  DATE: 04/15/22 Therapeutic  Exercise: Sidelying hip abduction x 15  Sidelying clam x 15  Abduction hold with knee extension  Upper trunk rotation x 5  Hamstring, ITB stretch with strap 30 sec  Core stability in supine: ball under pelvis  March , bent knee fall out , overhead lift using Pilates circle, isometric circle press with March Core stability in standing: hinge with 10 lbs KB pass (lateral) KB circle pass 1 min x 2 (feet in tandem)  Wall sit 60 sec with 10 lbs KB   [facility] Adult PT Treatment:                                                DATE: 04/13/22 Therapeutic Exercise: Supine band exercises: clam double leg and single leg green band  Tr A  x 10  PPT to bridge  Bridge with march and clam  Sidelying clam x 15 green band  Sidelying plank knees bent on forearms 3 x 15 sec  Wall x 3 , 30 sec   RDL x 15 , 15 lbs , then 25 lbs  Stagger RDL x 15 lbs each LE x  15 , min cues  Leg press 2 plates x 10 , 2 sets , slightly wider 2nd set   [facility] Adult PT Treatment:                                                DATE: 04/08/22 Therapeutic Exercise: PPT x10 3" Supine 90/90 5 sec holds x 10  Bridging on swiss ball 2x10 SL hip abd 2x10 each Supine clams 2x10 Manual Therapy: Skilled palpation to identify taut muscle bands Trigger Point Dry Needling Treatment: Pre-treatment instruction: Patient instructed on dry needling rationale, procedures, and possible side effects including pain during treatment (achy,cramping feeling), bruising, drop of blood, lightheadedness, nausea, sweating. Patient Consent Given: Yes Education handout provided: Previously provided Muscles treated: Lumbar paraspinals  Needle size and number: .30x93m x 4 L2 and L4 bilat Electrical stimulation performed: Yes Parameters: 287m,intensity as tolerated, 1365m. 80uA, intensity as tolerated, 12 mins. Treatment response/outcome: muscle contraction Post-treatment instructions: Patient instructed to expect possible mild to moderate muscle soreness  later today and/or tomorrow. Patient instructed in methods to reduce muscle soreness and to continue prescribed HEP. If patient was dry needled over the lung field, patient was instructed on signs and symptoms of pneumothorax and, however unlikely, to see immediate medical attention should they occur. Patient was also educated on signs and symptoms of infection and to seek medical attention should they occur. Patient verbalized understanding of these instructions and education.              PATIENT EDUCATION:  Education details: PT/POC, bursitis, MRI report, aquatics  Person educated: Patient Education method: ExpTheatre stage managerucation comprehension: verbalized understanding   HOME EXERCISE PROGRAM:  Access [facility]: [email or web address] Date: 03/24/2022 Prepared by: JenRaeford Razorxercises - Supine Lower Trunk Rotation  - 1 x daily - 7 x weekly - 2 sets - 10 reps - 10 hold - Seated Hamstring Stretch  - 1 x daily - 7 x weekly - 1 sets - 3 reps - 30 hold - Seated Piriformis Stretch  - 1 x  daily - 7 x weekly - 1 sets - 3 reps - 20-30 hold - Standing Hip Flexor Stretch  - 1 x daily - 7 x weekly - 1 sets - 3 reps - 20-30 hold - Bridge with Hip Abduction and Resistance  - 1 x daily - 7 x weekly - 2 sets - 10 reps - 5 hold - Clamshell with Resistance  - 1 x daily - 7 x weekly - 2 sets - 10 reps - 5 hold -Hip abduction   ASSESSMENT:   CLINICAL IMPRESSION: Focus today on core strength and stability using Pilates props.  She needs moderate cueing to engage abdominals and stabilize throughout the session.  She does exhibit increased lateral hip strength.  Overall she has excellent hip mobility but does need to work on stability.  Continue POC. Goals in progress.   OBJECTIVE IMPAIRMENTS: decreased activity tolerance, decreased balance, decreased mobility, difficulty walking, decreased ROM, decreased strength, hypomobility, increased fascial restrictions, impaired  flexibility, and pain.    ACTIVITY LIMITATIONS: bending, sitting, standing, squatting, sleeping, stairs, transfers, and locomotion level   PARTICIPATION LIMITATIONS: interpersonal relationship, shopping, and community activity   PERSONAL FACTORS: Time since onset of injury/illness/exacerbation and 3+ comorbidities: polyarthralgia, fibromyalgia, stress  are also affecting patient's functional outcome.    REHAB POTENTIAL: Excellent   CLINICAL DECISION MAKING: Stable/uncomplicated   EVALUATION COMPLEXITY: Low     GOALS: Goals reviewed with patient? Yes   SHORT TERM GOALS: Target date: 04/13/2022     Pt will be I with HEP for hip strength and core/trunk Baseline: Goal status: MET    2.  Pt will be able to notice increased ease with transfers after sitting short periods (10 min )  Baseline: 04/15/22  knee stiffness  Goal status: MET   3.  Pt will be screened for balance, fall risk and goal set  Baseline:  Goal status: MET      LONG TERM GOALS: Target date: 05/11/2022     Pt will be able to show FOTO score increased to 59%  Baseline: 52% 04/06/22: 57% Goal status: ongoing    2.  Pt will be able to sit for 20-30 min without increasing L hip pain for working on computer, meals  Baseline: 5-10 min , 04/15/22: improved  Goal status: ongoing    3.  Pt will be able to increase lateral hip strength to 4+/5 or better for stability with gait Baseline: 3+/5, 04/15/22: 4/5 Goal status: ongoing    4.  Pt will be able to lie on L side for portion of the night with no more than 4 /10 hip pain .  Baseline: 04/15/22, can do this about 30% better than previous Goal status:ongoing    5.  Patient will be independent in home exercise program upon discharge Baseline:  Goal status: ongoing   6. Patient will be able to complete 2 min walk for 480 feet with min dyspnea, no increased hip pain.    Baseline: 431 feet KNEE PAIN   Goal status: INITIAL         PLAN:   PT FREQUENCY: 2x/week    PT DURATION: 8 weeks   PLANNED INTERVENTIONS: Therapeutic exercises, Therapeutic activity, Neuromuscular re-education, Balance training, Gait training, Patient/Family education, Self Care, Joint mobilization, Aquatic Therapy, Dry Needling, Electrical stimulation, Cryotherapy, Moist heat, Taping, Ionotophoresis 53m/ml Dexamethasone, Manual therapy, and Re-evaluation   PLAN FOR NEXT SESSION: Check home exercise program hip AROM, core strengthening, dry needling left glute med, modalities if needed, manual.  Juandiego Kolenovic MS, PT 04/20/22 6:14 AM

## 2022-04-22 ENCOUNTER — Ambulatory Visit: Payer: BC Managed Care – PPO | Admitting: Physical Therapy

## 2022-04-26 NOTE — Progress Notes (Unsigned)
Cardiology Office Note:    Date:  04/27/2022   ID:  Jaime Chapman, DOB 28-Feb-1961, MRN [id number]  PCP:  Jinny Sanders, MD   [facility] Prov[facility] Cardiologist:  Candee Furbish, MD     Referring MD: Jinny Sanders, MD   Chief Complaint  Patient presents with   Hypertension    History of Present Illness:    Jaime Chapman is a 62 y.o. female seen at the request of Dr Diona Browner for evaluation of HTN. She was admitted overnight in August with hypertensive urgency. CT and MRI head were normal. She was told to follow up with cardiology at that time but is just getting around to it. She states she had bariatric surgery in 2022 and lost 45 lbs. Her antihypertensive therapy was stopped. BP meds were started in August and since then BP has been well controlled. She had a normal stress Echo in 2011 - saw Dr Aundra Dubin. Was seen by Dr Marlou Porch in 2020 with chest pain. Coronary CTA was normal with normal coronary arteries and calcium score of 0. She is on disability.   Past Medical History:  Diagnosis Date   Allergic rhinitis    Anxiety    Asthma    DDD (degenerative disc disease)    also has spinal arthritis   Depression    Diabetes mellitus    borderline   Dyslipidemia    Fibromyalgia    GERD (gastroesophageal reflux disease)    Hypertension    Lung nodules    RIGHT LUNG--LAST EVALUATED BY CHEST CT 02/01/12 - STABLE AND FOLLOW UP PLANNED IN ONE YEAR.   Lyme disease    Obesity    OSA (obstructive sleep apnea) 05/10/2017   Sinusitis    Stress disorder, acute    Vitamin D deficiency     Past Surgical History:  Procedure Laterality Date   CESAREAN SECTION  1982, 1985   x 2    ENDOMETRIAL ABLATION  2009   HERNIA REPAIR  04/14/12   ventral hernia repair   Laparoscopic ventral hernia repair w/ mesh  08/2009   Dr. Grandville Silos, [facility]   LAPAROSCOPY  2002   TUBAL LIGATION     VENTRAL HERNIA REPAIR  09/2006   Dr. Grandville Silos   VENTRAL HERNIA REPAIR N/A 04/14/2012   Procedure:  Laparoscopic Repair of  Ventral Hernia;  Surgeon: Pedro Earls, MD;  Location: [facility];  Service: General;  Laterality: N/A;  Repair of Recurrent Ventral Hernia    Current Medications: Current Meds  Medication Sig   albuterol (VENTOLIN HFA) 108 (90 Base) MCG/ACT inhaler INHALE 2 PUFFS INTO THE LUNGS EVERY 4 HOURS AS NEEDED FOR WHEEIZNG   Fezolinetant (VEOZAH) 45 MG TABS Take 45 mg by mouth daily.   hydrochlorothiazide (HYDRODIURIL) 12.5 MG tablet TAKE 1 TABLET BY MOUTH EVERY DAY   levothyroxine (SYNTHROID) 25 MCG tablet Take 1 tablet (25 mcg total) by mouth daily.   losartan-hydrochlorothiazide (HYZAAR) 50-12.5 MG tablet Take 1 tablet by mouth daily.     Allergies:   Progesterone, Codeine, and Cymbalta [duloxetine hcl]   Social History   Socioeconomic History   Marital status: Married    Spouse name: Simona Huh   Number of children: 2   Years of education: Not on file   Highest education level: Not on file  Occupational History   Occupation: Corporate treasurer  Tobacco Use   Smoking status: Former    Types: Cigarettes    Quit date: 03/01/2004    Years  since quitting: 18.1    Passive exposure: Past   Smokeless tobacco: Never  Vaping Use   Vaping Use: Never used  Substance and Sexual Activity   Alcohol use: No    Alcohol/week: 1.0 standard drink of alcohol    Types: 1 Standard drinks or equivalent per week   Drug use: No    Comment: remote majijuana   Sexual activity: Yes    Partners: Male    Birth control/protection: Post-menopausal  Other Topics Concern   Not on file  Social History Narrative   2 caffeine drinks daily  Married2 grown daughters, one is a crack addict and is in jail, she cares for her grandchildren.      Disabled   Social Determinants of Health   Financial Resource Strain: Not on file  Food Insecurity: Not on file  Transportation Needs: Not on file  Physical Activity: Not on file  Stress: Not on file  Social Connections: Not on file     Family  History: The patient's family history includes Alzheimer's disease in her father; Arthritis in her mother; Cancer in her maternal grandmother; Depression in her mother; Diabetes in her mother; Hearing loss in her mother; Heart disease (age of onset: 59) in her mother; Heart failure in her mother; Hypertension in her mother; Lung cancer in her maternal grandmother; Obesity in her mother. There is no history of Colon cancer.  ROS:   Please see the history of present illness.     All other systems reviewed and are negative.  EKGs/Labs/Other Studies Reviewed:    The following studies were reviewed today: Cardiac/Coronary  CT   TECHNIQUE: The patient was scanned on a [employer].   FINDINGS: A 120 kV prospective scan was triggered in the descending thoracic aorta at 111 HU's. Axial non-contrast 3 mm slices were carried out through the heart. The data set was analyzed on a dedicated work station and scored using the [place]. Gantry rotation speed was 250 msecs and collimation was .6 mm. No beta blockade and 0.8 mg of sl NTG was given. The 3D data set was reconstructed in 5% intervals of the 67-82 % of the R-R cycle. Diastolic phases were analyzed on a dedicated work station using MPR, MIP and VRT modes. The patient received 80 cc of contrast.   Aorta: Normal size. Minimal atherosclerotic plaque and no calcifications. No dissection.   Aortic Valve:  Trileaflet.  No calcifications.   Coronary Arteries:  Normal coronary origin.  Right dominance.   RCA is a large dominant artery that gives rise to acute marginal branch, PDA and PLA. There is no plaque.   Left main is a large artery that gives rise to LAD, ramus intermedius and LCX arteries. Left main has no plaque.   LAD is a large vessel that gives rise to two diagonal artery and has no plaque.   D1,2 have no plaque.   Ramus intermedius is a small branch with no plaque.   LCX is a small non-dominant that has no  plaque.   Other findings:   Normal pulmonary vein drainage into the left atrium.   Normal let atrial appendage without a thrombus.   Normal size of the pulmonary artery.   IMPRESSION: 1. Coronary calcium score of 0. This was 0 percentile for age and sex matched control.   2. Normal coronary origin with right dominance.   3. No evidence of CAD.  Consider non-cardiac causes of chest pain.     Electronically Signed  By: Ena Dawley   On: 08/11/2018 16:30    Addended by Dorothy Spark, MD on 08/11/2018  4:32 PM      EKG:  EKG is  ordered today.  The ekg ordered today demonstrates NSR rate 73. Normal. I have personally reviewed and interpreted this study.   Recent Labs: 10/08/2021: Hemoglobin 13.4; Platelets 224 12/08/2021: ALT 17; BUN 16; Creatinine, Ser 0.91; Potassium 4.0; Sodium 141 01/12/2022: TSH 4.04  Recent Lipid Panel    Component Value Date/Time   CHOL 259 (H) 12/08/2021 1016   TRIG 114.0 12/08/2021 1016   HDL 66.20 12/08/2021 1016   CHOLHDL 4 12/08/2021 1016   VLDL 22.8 12/08/2021 1016   LDLCALC 170 (H) 12/08/2021 1016   LDLDIRECT 139.0 09/09/2020 0837     Risk Assessment/Calculations:                Physical Exam:    VS:  BP 112/64   Pulse 73   Ht '5\' 4"'$  (1.626 m)   Wt 184 lb 9.6 oz (83.7 kg)   SpO2 98%   BMI 31.69 kg/m     Wt Readings from Last 3 Encounters:  04/27/22 184 lb 9.6 oz (83.7 kg)  03/17/22 187 lb 4 oz (84.9 kg)  03/03/22 185 lb 2 oz (84 kg)     GEN:  Well nourished, well developed in no acute distress HEENT: Normal NECK: No JVD; No carotid bruits LYMPHATICS: No lymphadenopathy CARDIAC: RRR, no murmurs, rubs, gallops RESPIRATORY:  Clear to auscultation without rales, wheezing or rhonchi  ABDOMEN: Soft, non-tender, non-distended MUSCULOSKELETAL:  No edema; No deformity  SKIN: Warm and dry NEUROLOGIC:  Alert and oriented x 3 PSYCHIATRIC:  Normal affect   ASSESSMENT:    1. Essential hypertension    PLAN:     In order of problems listed above:  HTN essential BP currently well controlled on losartan and HCTZ. No additional work up required at this time. Stressed importance of lifestyle modification with sodium restriction, weight control, and regular aerobic exercise. Overall CV risk is low especially with normal coronary CTA in 2020. Can follow up with [place] PRN.            Medication Adjustments/Labs and Tests Ordered: Current medicines are reviewed at length with the patient today.  Concerns regarding medicines are outlined above.  Orders Placed This Encounter  Procedures   EKG 12-Lead   No orders of the defined types were placed in this encounter.   Patient Instructions  Medication Instructions:  No changes *If you need a refill on your cardiac medications before your next appointment, please call your pharmacy*  Follow-Up: At [facility], you and your health needs are our priority.  As part of our continuing mission to provide you with exceptional heart care, we have created designated Provider Care Teams.  These Care Teams include your primary Cardiologist (physician) and Advanced Practice Providers (APPs -  Physician Assistants and Nurse Practitioners) who all work together to provide you with the care you need, when you need it.  We recommend signing up for the patient portal called "MyChart".  Sign up information is provided on this After Visit Summary.  MyChart is used to connect with patients for Virtual Visits (Telemedicine).  Patients are able to view lab/test results, encounter notes, upcoming appointments, etc.  Non-urgent messages can be sent to your provider as well.   To learn more about what you can do with MyChart, go to [email or web address].    Your next  appointment:   Follow up as needed      Signed, Lorraina Spring [place], MD  04/27/2022 8:24 AM    [place]

## 2022-04-26 NOTE — Therapy (Unsigned)
OUTPATIENT PHYSICAL THERAPY TREATMENT NOTE   Patient Name: Jaime Chapman MRN: [id number] DOB:08-11-1960, 62 y.o., female Today's Date: 04/27/2022  PCP: Jinny Sanders   REFERRING PROVIDER: Donella Stade PA-C  END OF SESSION:   PT End of Session - 04/27/22 1110     Visit Number 11    Number of Visits 16    Date for PT Re-Evaluation 05/11/22    Authorization Type BCBS    PT Start Time 66                Past Medical History:  Diagnosis Date   Allergic rhinitis    Anxiety    Asthma    DDD (degenerative disc disease)    also has spinal arthritis   Depression    Diabetes mellitus    borderline   Dyslipidemia    Fibromyalgia    GERD (gastroesophageal reflux disease)    Hypertension    Lung nodules    RIGHT LUNG--LAST EVALUATED BY CHEST CT 02/01/12 - STABLE AND FOLLOW UP PLANNED IN ONE YEAR.   Lyme disease    Obesity    OSA (obstructive sleep apnea) 05/10/2017   Sinusitis    Stress disorder, acute    Vitamin D deficiency    Past Surgical History:  Procedure Laterality Date   CESAREAN SECTION  1982, 1985   x 2    ENDOMETRIAL ABLATION  2009   HERNIA REPAIR  04/14/12   ventral hernia repair   Laparoscopic ventral hernia repair w/ mesh  08/2009   Dr. Grandville Silos, [facility]   LAPAROSCOPY  2002   TUBAL LIGATION     VENTRAL HERNIA REPAIR  09/2006   Dr. 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/11/2020   Class 1 drug-induced obesity with serious comorbidity and body mass index (BMI) of 31.0 to 31.9 in adult 09/14/2019   OSA (obstructive sleep apnea) 05/10/2017   Pre-syncope 03/10/2017   Abnormal CT  of the chest 08/31/2016   Panic attacks 04/09/2016   Essential hypertension 07/26/2014   Lyme disease 12/13/2013   Constipation - functional 11/20/2013   Hypothyroidism 11/16/2013   H/O ventral hernia repair 05/01/2012   Pulmonary nodules 01/26/2012   Prediabetes 03/04/2011   Fatty liver disease, nonalcoholic [id number]   Generalized anxiety disorder 10/21/2010   VENTRAL HERNIA-twice recurrent 09/08/2009   Vitamin D deficiency 12/27/2008   Hyperlipidemia 05/22/2007   Moderate persistent asthma 05/22/2007   DEGENERATIVE DISC DISEASE 05/22/2007   Fibromyalgia 05/22/2007   Allergic rhinitis 04/14/2007   GERD 04/14/2007    REFERRING DIAG: M25.552 (ICD-10-CM) - Pain in left hip  THERAPY DIAG:  Pain in left hip  Muscle weakness (generalized)  Stiffness of left hip, not elsewhere classified  Rationale for Evaluation and Treatment Rehabilitation  PERTINENT HISTORY: Fibrolyalgia, Rt knee, Rt hip pain .  Bariatric surgery.  Back pain.  See above.   PRECAUTIONS: None  SUBJECTIVE:  SUBJECTIVE STATEMENT: Patient had Covid last week.  Feeling better just a little tired. My back is really hurting today, I fell the other night as my daughter is going through an episode . I see the doctor (Alusio) this week Thurs.    PAIN:  Are you having pain? Yes: NPRS scale: none/10 Pain location: L hip Pain description: achy Aggravating factors: sitting, overactivity  Relieving factors: heat pad, OTC tylenol, changing position, injection Severe Rt knee pain, 8/10 at times   04/27/22: Back is 4/10, knee is 5/10.    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 No neural impingement to explain leg  symptoms.   MRI HIP 01/19/22: 1. Superior labral degeneration with a tear. 2. Mild tendinosis of the left gluteus minimus tendon insertion. 3. Mild tendinosis of the left hamstring origin.   PATIENT SURVEYS:  FOTO 52% FOTO 57% 04/06/22   COGNITION: Overall cognitive status: Within functional limits for tasks assessed                         SENSATION: WFL   EDEMA:  NT   MUSCLE LENGTH: Hamstrings: Good  Thomas test: NT   POSTURE: rounded shoulders, forward head, weight shift left, and genu varus    PALPATION: Tender TTP at left greater trochanter extending to mid thigh.  Pain in gluteus minimus, medius and piriformis muscle with palpation   LOWER EXTREMITY ROM:   Passive ROM Right eval Left eval  Hip flexion WNL WNL  Hip extension      Hip abduction      Hip adduction      Hip internal rotation decreased decreased  Hip external rotation Min Pain Pain   Knee flexion      Knee extension      Ankle dorsiflexion      Ankle plantarflexion      Ankle inversion      Ankle eversion       (Blank rows = not tested)   LOWER EXTREMITY MMT:   MMT Right eval Left eval Rt./Lt.  04/15/22  Hip flexion 4/5 4/5 4+/5  Hip extension       Hip abduction 3+/5 3+/5 4/5  Hip adduction       Hip internal rotation       Hip external rotation       Knee flexion 5/5 5/5   Knee extension 4/5 5/5   Ankle dorsiflexion       Ankle plantarflexion       Ankle inversion       Ankle eversion        (Blank rows = not tested)   LOWER EXTREMITY SPECIAL TESTS:  Hip special tests: Saralyn Pilar (FABER) test: negative and Hip scouring test: negative   FUNCTIONAL TESTS:  5 times sit to stand: 12.7 sec  30 seconds chair stand test Timed up and go (TUG): NT 2 minute walk test: NT   GAIT: Distance walked: 100 Assistive device utilized: None Level of assistance: Complete Independence Comments: not Remarkable     TODAY'S TREATMENT:   [facility] Adult PT Treatment:                                                 DATE: 04/27/22 Therapeutic  Exercise: Supine A/P tilt with ball under pelvis  March and clam for stability SLR ball under pelvis  Sidelying hip abduction with sidekicks  Sidelying hip circles  Quadruped green band bird dog x 10 each side (band on foot and opp UE)  Wall sit, squat with ball behind back and Pilates circle  Isometric staggered wall sit 15 sec x 3  Heel raise with circle x 10  Wide standing lunge with upper body rotation.     [facility] Adult PT Treatment:                                                DATE: 04/15/22 Therapeutic Exercise: Sidelying hip abduction x 15  Sidelying clam x 15  Abduction hold with knee extension  Upper trunk rotation x 5  Hamstring, ITB stretch with strap 30 sec  Core stability in supine: ball under pelvis  March , bent knee fall out , overhead lift using Pilates circle, isometric circle press with March Core stability in standing: hinge with 10 lbs KB pass (lateral) KB circle pass 1 min x 2 (feet in tandem)  Wall sit 60 sec with 10 lbs KB   [facility] Adult PT Treatment:                                                DATE: 04/13/22 Therapeutic Exercise: Supine band exercises: clam double leg and single leg green band  Tr A  x 10  PPT to bridge  Bridge with march and clam  Sidelying clam x 15 green band  Sidelying plank knees bent on forearms 3 x 15 sec  Wall x 3 , 30 sec   RDL x 15 , 15 lbs , then 25 lbs  Stagger RDL x 15 lbs each LE x  15 , min cues  Leg press 2 plates x 10 , 2 sets , slightly wider 2nd set   [facility] Adult PT Treatment:                                                DATE: 04/08/22 Therapeutic Exercise: PPT x10 3" Supine 90/90 5 sec holds x 10  Bridging on swiss ball 2x10 SL hip abd 2x10 each Supine clams 2x10 Manual Therapy: Skilled palpation to identify taut muscle bands Trigger Point Dry Needling Treatment: Pre-treatment instruction: Patient instructed on dry needling rationale, procedures, and possible side  effects including pain during treatment (achy,cramping feeling), bruising, drop of blood, lightheadedness, nausea, sweating. Patient Consent Given: Yes Education handout provided: Previously provided Muscles treated: Lumbar paraspinals  Needle size and number: .30x54m x 4 L2 and L4 bilat Electrical stimulation performed: Yes Parameters: 231m,intensity as tolerated, 1387m. 80uA, intensity as tolerated, 12 mins. Treatment response/outcome: muscle contraction Post-treatment instructions: Patient instructed to expect possible mild to moderate muscle soreness later today and/or tomorrow. Patient instructed in methods to reduce muscle soreness and to continue prescribed HEP. If patient was dry needled over the lung field, patient was instructed on signs and symptoms of pneumothorax and, however unlikely, to see immediate medical attention should they occur. Patient  was also educated on signs and symptoms of infection and to seek medical attention should they occur. Patient verbalized understanding of these instructions and education.              PATIENT EDUCATION:  Education details: PT/POC, bursitis, MRI report, aquatics  Person educated: Patient Education method: Theatre stage manager Education comprehension: verbalized understanding   HOME EXERCISE PROGRAM:  Access Code: [facility] URL: [email or web address] Date: 03/24/2022 Prepared by: Raeford Razor  Exercises - Supine Lower Trunk Rotation  - 1 x daily - 7 x weekly - 2 sets - 10 reps - 10 hold - Seated Hamstring Stretch  - 1 x daily - 7 x weekly - 1 sets - 3 reps - 30 hold - Seated Piriformis Stretch  - 1 x daily - 7 x weekly - 1 sets - 3 reps - 20-30 hold - Standing Hip Flexor Stretch  - 1 x daily - 7 x weekly - 1 sets - 3 reps - 20-30 hold - Bridge with Hip Abduction and Resistance  - 1 x daily - 7 x weekly - 2 sets - 10 reps - 5 hold - Clamshell with Resistance  - 1 x daily - 7 x weekly - 2 sets - 10 reps - 5  hold -Hip abduction   ASSESSMENT:   CLINICAL IMPRESSION: Patient continues to have pain in her back, knee and hip.  Not rated today.  Worked on core and LE exercises for strength and stability.  She has been able to continue her HEP while sick but alos going through some very stressful times at home.  She is hoping to get some relief with her appt this week as her knee pain is constant and quite severe.    OBJECTIVE IMPAIRMENTS: decreased activity tolerance, decreased balance, decreased mobility, difficulty walking, decreased ROM, decreased strength, hypomobility, increased fascial restrictions, impaired flexibility, and pain.    ACTIVITY LIMITATIONS: bending, sitting, standing, squatting, sleeping, stairs, transfers, and locomotion level   PARTICIPATION LIMITATIONS: interpersonal relationship, shopping, and community activity   PERSONAL FACTORS: Time since onset of injury/illness/exacerbation and 3+ comorbidities: polyarthralgia, fibromyalgia, stress  are also affecting patient's functional outcome.    REHAB POTENTIAL: Excellent   CLINICAL DECISION MAKING: Stable/uncomplicated   EVALUATION COMPLEXITY: Low     GOALS: Goals reviewed with patient? Yes   SHORT TERM GOALS: Target date: 04/13/2022     Pt will be I with HEP for hip strength and core/trunk Baseline: Goal status: MET    2.  Pt will be able to notice increased ease with transfers after sitting short periods (10 min )  Baseline: 04/15/22  knee stiffness  Goal status: MET   3.  Pt will be screened for balance, fall risk and goal set  Baseline:  Goal status: MET      LONG TERM GOALS: Target date: 05/11/2022     Pt will be able to show FOTO score increased to 59%  Baseline: 52% 04/06/22: 57% Goal status: ongoing    2.  Pt will be able to sit for 20-30 min without increasing L hip pain for working on computer, meals  Baseline: 5-10 min , 04/15/22: improved  Goal status: ongoing    3.  Pt will be able to increase  lateral hip strength to 4+/5 or better for stability with gait Baseline: 3+/5, 04/15/22: 4/5 Goal status: ongoing    4.  Pt will be able to lie on L side for portion of the night with no more than 4 /10  hip pain .  Baseline: 04/15/22, can do this about 30% better than previous Goal status:ongoing    5.  Patient will be independent in home exercise program upon discharge Baseline:  Goal status: ongoing   6. Patient will be able to complete 2 min walk for 480 feet with min dyspnea, no increased hip pain.    Baseline: 431 feet KNEE PAIN   Goal status: INITIAL         PLAN:   PT FREQUENCY: 2x/week   PT DURATION: 8 weeks   PLANNED INTERVENTIONS: Therapeutic exercises, Therapeutic activity, Neuromuscular re-education, Balance training, Gait training, Patient/Family education, Self Care, Joint mobilization, Aquatic Therapy, Dry Needling, Electrical stimulation, Cryotherapy, Moist heat, Taping, Ionotophoresis '4mg'$ /ml Dexamethasone, Manual therapy, and Re-evaluation   PLAN FOR NEXT SESSION: More standing as tolerated, core and hip. avoid increasing knee pain .   Raeford Razor, PT 04/27/22 1:27 PM Phone: [phone] Fax: [phone]

## 2022-04-27 ENCOUNTER — Encounter: Payer: Self-pay | Admitting: Physical Therapy

## 2022-04-27 ENCOUNTER — Encounter: Payer: Self-pay | Admitting: Cardiology

## 2022-04-27 ENCOUNTER — Ambulatory Visit: Payer: BC Managed Care – PPO | Admitting: Physical Therapy

## 2022-04-27 ENCOUNTER — Ambulatory Visit: Payer: BC Managed Care – PPO | Attending: Cardiology | Admitting: Cardiology

## 2022-04-27 VITALS — BP 112/64 | HR 73 | Ht 64.0 in | Wt 184.6 lb

## 2022-04-27 DIAGNOSIS — I10 Essential (primary) hypertension: Secondary | ICD-10-CM

## 2022-04-27 DIAGNOSIS — M25652 Stiffness of left hip, not elsewhere classified: Secondary | ICD-10-CM

## 2022-04-27 DIAGNOSIS — M6281 Muscle weakness (generalized): Secondary | ICD-10-CM | POA: Diagnosis not present

## 2022-04-27 DIAGNOSIS — M25552 Pain in left hip: Secondary | ICD-10-CM

## 2022-04-27 NOTE — Patient Instructions (Signed)
Medication Instructions:  No changes *If you need a refill on your cardiac medications before your next appointment, please call your pharmacy*  Follow-Up: At [facility], you and your health needs are our priority.  As part of our continuing mission to provide you with exceptional heart care, we have created designated Provider Care Teams.  These Care Teams include your primary Cardiologist (physician) and Advanced Practice Providers (APPs -  Physician Assistants and Nurse Practitioners) who all work together to provide you with the care you need, when you need it.  We recommend signing up for the patient portal called "MyChart".  Sign up information is provided on this After Visit Summary.  MyChart is used to connect with patients for Virtual Visits (Telemedicine).  Patients are able to view lab/test results, encounter notes, upcoming appointments, etc.  Non-urgent messages can be sent to your provider as well.   To learn more about what you can do with MyChart, go to [email or web address].    Your next appointment:   Follow up as needed

## 2022-04-28 DIAGNOSIS — M25561 Pain in right knee: Secondary | ICD-10-CM | POA: Diagnosis not present

## 2022-04-29 ENCOUNTER — Encounter: Payer: Self-pay | Admitting: Physical Therapy

## 2022-04-29 ENCOUNTER — Ambulatory Visit: Payer: BC Managed Care – PPO | Admitting: Physical Therapy

## 2022-04-29 DIAGNOSIS — M25652 Stiffness of left hip, not elsewhere classified: Secondary | ICD-10-CM | POA: Diagnosis not present

## 2022-04-29 DIAGNOSIS — M6281 Muscle weakness (generalized): Secondary | ICD-10-CM | POA: Diagnosis not present

## 2022-04-29 DIAGNOSIS — M25552 Pain in left hip: Secondary | ICD-10-CM | POA: Diagnosis not present

## 2022-04-29 NOTE — Therapy (Signed)
OUTPATIENT PHYSICAL THERAPY TREATMENT NOTE   Patient Name: Jaime Chapman MRN: [id number] DOB:08-15-1960, 62 y.o., female Today's Date: 04/29/2022  PCP: Jinny Sanders   REFERRING PROVIDER: Donella Stade PA-C  END OF SESSION:   PT End of Session - 04/29/22 1159     Visit Number 12    Number of Visits 16    Date for PT Re-Evaluation 05/11/22    Authorization Type BC[employer]    PT Start Time 1154    PT Stop Time [id number]    PT Time Calculation (min) 41 min    Activity Tolerance Patient tolerated treatment well;Patient limited by pain    Behavior During Therapy WFL for tasks assessed/performed                Past Medical History:  Diagnosis Date   Allergic rhinitis    Anxiety    Asthma    DDD (degenerative disc disease)    also has spinal arthritis   Depression    Diabetes mellitus    borderline   Dyslipidemia    Fibromyalgia    GERD (gastroesophageal reflux disease)    Hypertension    Lung nodules    RIGHT LUNG--LAST EVALUATED BY CHEST CT 02/01/12 - STABLE AND FOLLOW UP PLANNED IN ONE YEAR.   Lyme disease    Obesity    OSA (obstructive sleep apnea) 05/10/2017   Sinusitis    Stress disorder, acute    Vitamin D deficiency    Past Surgical History:  Procedure Laterality Date   CESAREAN SECTION  1982, 1985   x 2    ENDOMETRIAL ABLATION  2009   HERNIA REPAIR  04/14/12   ventral hernia repair   Laparoscopic ventral hernia repair w/ mesh  08/2009   Dr. Grandville Silos, [facility]   LAPAROSCOPY  2002   TUBAL LIGATION     VENTRAL HERNIA REPAIR  09/2006   Dr. Grandville Silos   VENTRAL HERNIA REPAIR N/A 04/14/2012   Procedure: Laparoscopic Repair of  Ventral Hernia;  Surgeon: Pedro Earls, MD;  Location: [facility];  Service: General;  Laterality: N/A;  Repair of Recurrent Ventral Hernia   Patient Active Problem List   Diagnosis Date Noted   Moderate episode of recurrent major depressive disorder ([place]) 06/03/2020   Attention deficit disorder (ADD) without hyperactivity 03/11/2020    Class 1 drug-induced obesity with serious comorbidity and body mass index (BMI) of 31.0 to 31.9 in adult 09/14/2019   OSA (obstructive sleep apnea) 05/10/2017   Pre-syncope 03/10/2017   Abnormal CT of the chest 08/31/2016   Panic attacks 04/09/2016   Essential hypertension 07/26/2014   Lyme disease 12/13/2013   Constipation - functional 11/20/2013   Hypothyroidism 11/16/2013   H/O ventral hernia repair 05/01/2012   Pulmonary nodules 01/26/2012   Prediabetes 03/04/2011   Fatty liver disease, nonalcoholic [id number]   Generalized anxiety disorder 10/21/2010   VENTRAL HERNIA-twice recurrent 09/08/2009   Vitamin D deficiency 12/27/2008   Hyperlipidemia 05/22/2007   Moderate persistent asthma 05/22/2007   DEGENERATIVE DISC DISEASE 05/22/2007   Fibromyalgia 05/22/2007   Allergic rhinitis 04/14/2007   GERD 04/14/2007    REFERRING DIAG: M25.552 (ICD-10-CM) - Pain in left hip  THERAPY DIAG:  Pain in left hip  Muscle weakness (generalized)  Stiffness of left hip, not elsewhere classified  Rationale for Evaluation and Treatment Rehabilitation  PERTINENT HISTORY: Fibrolyalgia, Rt knee, Rt hip pain .  Bariatric surgery.  Back pain.  See above.   PRECAUTIONS: None  SUBJECTIVE:  SUBJECTIVE STATEMENT: No hip pain .  They are scheduling my surgery . My back is really hurting today.    PAIN:  Are you having pain? Yes: NPRS scale: none/10 Pain location: L hip Pain description: achy Aggravating factors: sitting, overactivity  Relieving factors: heat pad, OTC tylenol, changing position, injection Severe Rt knee pain, 8/10 at times   04/29/22: Back is 5/10, knee is 5/10.    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 No neural impingement to explain leg symptoms.   MRI HIP 01/19/22: 1. Superior labral degeneration with a tear. 2. Mild tendinosis of the left gluteus minimus tendon insertion. 3. Mild tendinosis of the left hamstring origin.   PATIENT SURVEYS:  FOTO 52% FOTO 57% 04/06/22   COGNITION: Overall cognitive status: Within functional limits for tasks assessed                         SENSATION: WFL   EDEMA:  NT   MUSCLE LENGTH: Hamstrings: Good  Thomas test: NT   POSTURE: rounded shoulders, forward head, weight shift left, and genu varus    PALPATION: Tender TTP at left greater trochanter extending to mid thigh.  Pain in gluteus minimus, medius and piriformis muscle with palpation   LOWER EXTREMITY ROM:   Passive ROM Right eval Left eval  Hip flexion WNL WNL  Hip extension      Hip abduction      Hip adduction      Hip internal rotation decreased decreased  Hip external rotation Min Pain Pain   Knee flexion      Knee extension      Ankle dorsiflexion      Ankle plantarflexion      Ankle inversion      Ankle eversion       (Blank rows = not tested)   LOWER EXTREMITY MMT:   MMT Right eval Left eval Rt./Lt.  04/15/22  Hip flexion 4/5 4/5 4+/5  Hip extension       Hip abduction 3+/5 3+/5 4/5  Hip adduction       Hip internal rotation       Hip external rotation       Knee flexion 5/5 5/5   Knee extension 4/5 5/5   Ankle dorsiflexion       Ankle plantarflexion       Ankle inversion       Ankle eversion        (Blank rows = not tested)   LOWER EXTREMITY SPECIAL TESTS:  Hip special tests: Saralyn Pilar (FABER) test: negative and Hip scouring test: negative   FUNCTIONAL TESTS:  5 times sit to stand: 12.7 sec  30 seconds chair stand test Timed up and go (TUG): NT 2 minute walk test: NT   GAIT: Distance walked: 100 Assistive device utilized: None Level of assistance: Complete Independence Comments: not Remarkable     TODAY'S  TREATMENT:    [facility] Adult PT Treatment:                                                DATE: 04/29/22 Therapeutic Exercise: Sink stretch  Squat x 15 Hip abduction and extension red T band at ankles  Banded knee ext red band  x 15  unable to do this on Rt LE  SLR with core focus  x 10  90/90 hold with towel under hips  90/90 hold with arm lift alternating , then LE toe taps  x 10 Bridging x 10 no pain Hip hinge single and double leg standing using 10 lbs KB  Self Care: POC, consider PT visit limit   [facility] Adult PT Treatment:                                                DATE: 04/27/22 Therapeutic Exercise: Supine A/P tilt with ball under pelvis  March and clam for stability SLR ball under pelvis  Sidelying hip abduction with sidekicks  Sidelying hip circles  Quadruped green band bird dog x 10 each side (band on foot and opp UE)  Wall sit, squat with ball behind back and Pilates circle  Isometric staggered wall sit 15 sec x 3  Heel raise with circle x 10  Wide standing lunge with upper body rotation.     [facility] Adult PT Treatment:                                                DATE: 04/15/22 Therapeutic Exercise: Sidelying hip abduction x 15  Sidelying clam x 15  Abduction hold with knee extension  Upper trunk rotation x 5  Hamstring, ITB stretch with strap 30 sec  Core stability in supine: ball under pelvis  March , bent knee fall out , overhead lift using Pilates circle, isometric circle press with March Core stability in standing: hinge with 10 lbs KB pass (lateral) KB circle pass 1 min x 2 (feet in tandem)  Wall sit 60 sec with 10 lbs KB   [facility] Adult PT Treatment:                                                DATE: 04/13/22 Therapeutic Exercise: Supine band exercises: clam double leg and single leg green band  Tr A  x 10  PPT to bridge  Bridge with march and clam  Sidelying clam x 15 green band  Sidelying plank knees bent on forearms 3 x 15 sec  Wall x 3 , 30 sec    RDL x 15 , 15 lbs , then 25 lbs  Stagger RDL x 15 lbs each LE x  15 , min cues  Leg press 2 plates x 10 , 2 sets , slightly wider 2nd set   [facility] Adult PT Treatment:                                                DATE: 04/08/22 Therapeutic Exercise: PPT x10 3" Supine 90/90 5 sec holds x 10  Bridging on swiss ball 2x10 SL hip abd 2x10 each Supine clams 2x10 Manual Therapy: Skilled palpation to identify taut muscle bands Trigger Point Dry Needling Treatment: Pre-treatment instruction: Patient instructed on dry needling rationale, procedures, and  possible side effects including pain during treatment (achy,cramping feeling), bruising, drop of blood, lightheadedness, nausea, sweating. Patient Consent Given: Yes Education handout provided: Previously provided Muscles treated: Lumbar paraspinals  Needle size and number: .30x58m x 4 L2 and L4 bilat Electrical stimulation performed: Yes Parameters: 244m,intensity as tolerated, 1333m. 80uA, intensity as tolerated, 12 mins. Treatment response/outcome: muscle contraction Post-treatment instructions: Patient instructed to expect possible mild to moderate muscle soreness later today and/or tomorrow. Patient instructed in methods to reduce muscle soreness and to continue prescribed HEP. If patient was dry needled over the lung field, patient was instructed on signs and symptoms of pneumothorax and, however unlikely, to see immediate medical attention should they occur. Patient was also educated on signs and symptoms of infection and to seek medical attention should they occur. Patient verbalized understanding of these instructions and education.              PATIENT EDUCATION:  Education details: PT/POC, bursitis, MRI report, aquatics  Person educated: Patient Education method: ExpTheatre stage managerucation comprehension: verbalized understanding   HOME EXERCISE PROGRAM:  [email or web address]: [email or web address] Date:  03/24/2022 Prepared by: JenRaeford Razorxercises - Supine Lower Trunk Rotation  - 1 x daily - 7 x weekly - 2 sets - 10 reps - 10 hold - Seated Hamstring Stretch  - 1 x daily - 7 x weekly - 1 sets - 3 reps - 30 hold - Seated Piriformis Stretch  - 1 x daily - 7 x weekly - 1 sets - 3 reps - 20-30 hold - Standing Hip Flexor Stretch  - 1 x daily - 7 x weekly - 1 sets - 3 reps - 20-30 hold - Bridge with Hip Abduction and Resistance  - 1 x daily - 7 x weekly - 2 sets - 10 reps - 5 hold - Clamshell with Resistance  - 1 x daily - 7 x weekly - 2 sets - 10 reps - 5 hold -Hip abduction  90/90 abs  ASSESSMENT:   CLINICAL IMPRESSION: Patient is making progress with her hip pain but she continues to have hip and Rt knee pain . She will be having rt TKA in early June .  She will be ready for DC after her next week so as not to exhaust her PT visits. She needed cues and increased time to engage lower abs in supine but has great form with lifting in standing position.  Back felt better after session.    OBJECTIVE IMPAIRMENTS: decreased activity tolerance, decreased balance, decreased mobility, difficulty walking, decreased ROM, decreased strength, hypomobility, increased fascial restrictions, impaired flexibility, and pain.    ACTIVITY LIMITATIONS: bending, sitting, standing, squatting, sleeping, stairs, transfers, and locomotion level   PARTICIPATION LIMITATIONS: interpersonal relationship, shopping, and community activity   PERSONAL FACTORS: Time since onset of injury/illness/exacerbation and 3+ comorbidities: polyarthralgia, fibromyalgia, stress  are also affecting patient's functional outcome.    REHAB POTENTIAL: Excellent   CLINICAL DECISION MAKING: Stable/uncomplicated   EVALUATION COMPLEXITY: Low     GOALS: Goals reviewed with patient? Yes   SHORT TERM GOALS: Target date: 04/13/2022     Pt will be I with HEP for hip strength and core/trunk Baseline: Goal status: MET    2.  Pt will be  able to notice increased ease with transfers after sitting short periods (10 min )  Baseline: 04/15/22  knee stiffness  Goal status: MET   3.  Pt will be screened for balance, fall risk and goal set  Baseline:  Goal status: MET      LONG TERM GOALS: Target date: 05/11/2022     Pt will be able to show FOTO score increased to 59%  Baseline: 52% 04/06/22: 57% Goal status: ongoing    2.  Pt will be able to sit for 20-30 min without increasing L hip pain for working on computer, meals  Baseline: 5-10 min , 04/15/22: improved  Goal status: ongoing    3.  Pt will be able to increase lateral hip strength to 4+/5 or better for stability with gait Baseline: 3+/5, 04/15/22: 4/5 Goal status: ongoing    4.  Pt will be able to lie on L side for portion of the night with no more than 4 /10 hip pain .  Baseline: 04/15/22, can do this about 30% better than previous Goal status:ongoing    5.  Patient will be independent in home exercise program upon discharge Baseline:  Goal status: ongoing   6. Patient will be able to complete 2 min walk for 480 feet with min dyspnea, no increased hip pain.    Baseline: 431 feet KNEE PAIN   Goal status: INITIAL         PLAN:   PT FREQUENCY: 2x/week   PT DURATION: 8 weeks   PLANNED INTERVENTIONS: Therapeutic exercises, Therapeutic activity, Neuromuscular re-education, Balance training, Gait training, Patient/Family education, Self Care, Joint mobilization, Aquatic Therapy, Dry Needling, Electrical stimulation, Cryotherapy, Moist heat, Taping, Ionotophoresis '4mg'$ /ml Dexamethasone, Manual therapy, and Re-evaluation   PLAN FOR NEXT SESSION: More standing as tolerated, core and hip. avoid increasing knee pain .   Raeford Razor, PT 04/29/22 1:06 PM Phone: [phone] Fax: [phone]

## 2022-04-30 ENCOUNTER — Telehealth: Payer: Self-pay | Admitting: *Deleted

## 2022-04-30 NOTE — Telephone Encounter (Signed)
   Pre-operative Risk Assessment    Patient Name: Jaime Chapman  DOB: 23-Sep-1960 MRN: [id number]      Request for Surgical Clearance    Procedure:   RIGHT TOTAL KNEE ARTHROPLASTY  Date of Surgery:  Clearance 08/17/22                                 Surgeon:  DR. Gaynelle Arabian Surgeon's Group or Practice Name:  [facility] Phone number:  [phone] ATTN: [place] [facility] Fax number:  [phone]   Type of Clearance Requested:   - Medical ; NO MEDICATIONS LISTED AS NEEDING TO BE HELD    Type of Anesthesia:   CHOICE   Additional requests/questions:    Jiles Prows   04/30/2022, 12:27 PM

## 2022-05-03 NOTE — Therapy (Signed)
OUTPATIENT PHYSICAL THERAPY TREATMENT NOTE   Patient Name: Jaime Chapman MRN: [id number] DOB:12-04-1960, 62 y.o., female Today's Date: 05/04/2022  PCP: Jinny Sanders   REFERRING PROVIDER: Donella Stade PA-C  END OF SESSION:   PT End of Session - 05/04/22 1104     Visit Number 13    Number of Visits 16    Date for PT Re-Evaluation 05/11/22    Authorization Type [employer]    PT Start Time 1100    PT Stop Time 1145    PT Time Calculation (min) 45 min    Activity Tolerance Patient tolerated treatment well    Behavior During Therapy WFL for tasks assessed/performed                 Past Medical History:  Diagnosis Date   Allergic rhinitis    Anxiety    Asthma    DDD (degenerative disc disease)    also has spinal arthritis   Depression    Diabetes mellitus    borderline   Dyslipidemia    Fibromyalgia    GERD (gastroesophageal reflux disease)    Hypertension    Lung nodules    RIGHT LUNG--LAST EVALUATED BY CHEST CT 02/01/12 - STABLE AND FOLLOW UP PLANNED IN ONE YEAR.   Lyme disease    Obesity    OSA (obstructive sleep apnea) 05/10/2017   Sinusitis    Stress disorder, acute    Vitamin D deficiency    Past Surgical History:  Procedure Laterality Date   CESAREAN SECTION  1982, 1985   x 2    ENDOMETRIAL ABLATION  2009   HERNIA REPAIR  04/14/12   ventral hernia repair   Laparoscopic ventral hernia repair w/ mesh  08/2009   Dr. Grandville Silos, [facility]   LAPAROSCOPY  2002   TUBAL LIGATION     VENTRAL HERNIA REPAIR  09/2006   Dr. Grandville Silos   VENTRAL HERNIA REPAIR N/A 04/14/2012   Procedure: Laparoscopic Repair of  Ventral Hernia;  Surgeon: Pedro Earls, MD;  Location: [facility];  Service: General;  Laterality: N/A;  Repair of Recurrent Ventral Hernia   Patient Active Problem List   Diagnosis Date Noted   Moderate episode of recurrent major depressive disorder ([place]) 06/03/2020   Attention deficit disorder (ADD) without hyperactivity 03/11/2020   Class 1 drug-induced  obesity with serious comorbidity and body mass index (BMI) of 31.0 to 31.9 in adult 09/14/2019   OSA (obstructive sleep apnea) 05/10/2017   Pre-syncope 03/10/2017   Abnormal CT of the chest 08/31/2016   Panic attacks 04/09/2016   Essential hypertension 07/26/2014   Lyme disease 12/13/2013   Constipation - functional 11/20/2013   Hypothyroidism 11/16/2013   H/O ventral hernia repair 05/01/2012   Pulmonary nodules 01/26/2012   Prediabetes 03/04/2011   Fatty liver disease, nonalcoholic [id number]   Generalized anxiety disorder 10/21/2010   VENTRAL HERNIA-twice recurrent 09/08/2009   Vitamin D deficiency 12/27/2008   Hyperlipidemia 05/22/2007   Moderate persistent asthma 05/22/2007   DEGENERATIVE DISC DISEASE 05/22/2007   Fibromyalgia 05/22/2007   Allergic rhinitis 04/14/2007   GERD 04/14/2007    REFERRING DIAG: M25.552 (ICD-10-CM) - Pain in left hip  THERAPY DIAG:  Pain in left hip  Muscle weakness (generalized)  Stiffness of left hip, not elsewhere classified  Rationale for Evaluation and Treatment Rehabilitation  PERTINENT HISTORY: Fibrolyalgia, Rt knee, Rt hip pain .  Bariatric surgery.  Back pain.  See above.   PRECAUTIONS: None  SUBJECTIVE:  SUBJECTIVE STATEMENT: No hip pain.  Back is better than it was.    PAIN:  Are you having pain? Yes: NPRS scale: none/10 Pain location: L hip Pain description: achy Aggravating factors: sitting, overactivity  Relieving factors: heat pad, OTC tylenol, changing position, injection Severe Rt knee pain, 8/10 at times   05/04/22: Back is 3/10, knee is 5/10.    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 No neural impingement  to explain leg symptoms.   MRI HIP 01/19/22: 1. Superior labral degeneration with a tear. 2. Mild tendinosis of the left gluteus minimus tendon insertion. 3. Mild tendinosis of the left hamstring origin.   PATIENT SURVEYS:  FOTO 52% FOTO 57% 04/06/22   COGNITION: Overall cognitive status: Within functional limits for tasks assessed                         SENSATION: WFL   EDEMA:  NT   MUSCLE LENGTH: Hamstrings: Good  Thomas test: NT   POSTURE: rounded shoulders, forward head, weight shift left, and genu varus    PALPATION: Tender TTP at left greater trochanter extending to mid thigh.  Pain in gluteus minimus, medius and piriformis muscle with palpation   LOWER EXTREMITY ROM:   Passive ROM Right eval Left eval  Hip flexion WNL WNL  Hip extension      Hip abduction      Hip adduction      Hip internal rotation decreased decreased  Hip external rotation Min Pain Pain   Knee flexion      Knee extension      Ankle dorsiflexion      Ankle plantarflexion      Ankle inversion      Ankle eversion       (Blank rows = not tested)   LOWER EXTREMITY MMT:   MMT Right eval Left eval Rt./Lt.  04/15/22  Hip flexion 4/5 4/5 4+/5  Hip extension       Hip abduction 3+/5 3+/5 4/5  Hip adduction       Hip internal rotation       Hip external rotation       Knee flexion 5/5 5/5   Knee extension 4/5 5/5   Ankle dorsiflexion       Ankle plantarflexion       Ankle inversion       Ankle eversion        (Blank rows = not tested)   LOWER EXTREMITY SPECIAL TESTS:  Hip special tests: Saralyn Pilar (FABER) test: negative and Hip scouring test: negative   FUNCTIONAL TESTS:  5 times sit to stand: 12.7 sec  30 seconds chair stand test Timed up and go (TUG): NT 2 minute walk test: NT   GAIT: Distance walked: 100 Assistive device utilized: None Level of assistance: Complete Independence Comments: not Remarkable     TODAY'S TREATMENT:   [facility] Adult PT Treatment:                                                 DATE: 05/04/22 Therapeutic Exercise: Standing core: Palloff press blue band and rotation  Neutral core used Dynadisc under pelvis Unilateral bent knee fall out 2 sets x 10 (in mini bridge) 90/90 hold  30 sec  UE and LE reach x 10 each side (ipsilateral) Sideplank 10 sec x 3 static bilateral  Kneeling plank variations  High plank on mat table x 30 sec  2 min walk no increased knee pain 465 feet  Wall squat to chest press x 10 , 8 lbs  Slider lunges x 10 each side  Supine hip stretching , piriformis, wide knees LTR with hamstring stretch   [facility] Adult PT Treatment:                                                DATE: 04/29/22 Therapeutic Exercise: Sink stretch  Squat x 15 Hip abduction and extension red T band at ankles  Banded knee ext red band x 15  unable to do this on Rt LE  SLR with core focus  x 10  90/90 hold with towel under hips  90/90 hold with arm lift alternating , then LE toe taps  x 10 Bridging x 10 no pain Hip hinge single and double leg standing using 10 lbs KB  Self Care: POC, consider PT visit limit   [facility] Adult PT Treatment:                                                DATE: 04/27/22 Therapeutic Exercise: Supine A/P tilt with ball under pelvis  March and clam for stability SLR ball under pelvis  Sidelying hip abduction with sidekicks  Sidelying hip circles  Quadruped green band bird dog x 10 each side (band on foot and opp UE)  Wall sit, squat with ball behind back and Pilates circle  Isometric staggered wall sit 15 sec x 3  Heel raise with circle x 10  Wide standing lunge with upper body rotation.     [facility] Adult PT Treatment:                                                DATE: 04/15/22 Therapeutic Exercise: Sidelying hip abduction x 15  Sidelying clam x 15  Abduction hold with knee extension  Upper trunk rotation x 5  Hamstring, ITB stretch with strap 30 sec  Core stability in supine: ball under pelvis  March , bent knee  fall out , overhead lift using Pilates circle, isometric circle press with March Core stability in standing: hinge with 10 lbs KB pass (lateral) KB circle pass 1 min x 2 (feet in tandem)  Wall sit 60 sec with 10 lbs KB                PATIENT EDUCATION:  Education details: PT/POC, bursitis, MRI report, aquatics  Person educated: Patient Education method: Theatre stage manager Education comprehension: verbalized understanding   HOME EXERCISE PROGRAM:  Access Code: [facility] URL: [email or web address] Date: 03/24/2022 Prepared by: Raeford Razor  Exercises - Supine Lower Trunk Rotation  - 1 x daily - 7 x weekly - 2 sets - 10 reps - 10 hold - Seated Hamstring Stretch  - 1 x daily - 7 x weekly - 1 sets - 3 reps - 30 hold - Seated Piriformis  Stretch  - 1 x daily - 7 x weekly - 1 sets - 3 reps - 20-30 hold - Standing Hip Flexor Stretch  - 1 x daily - 7 x weekly - 1 sets - 3 reps - 20-30 hold - Bridge with Hip Abduction and Resistance  - 1 x daily - 7 x weekly - 2 sets - 10 reps - 5 hold - Clamshell with Resistance  - 1 x daily - 7 x weekly - 2 sets - 10 reps - 5 hold -Hip abduction  90/90 abs Sideplank   ASSESSMENT:   CLINICAL IMPRESSION: Patient shows good tolerance for exercise today.  She has a great HEP and is quite compliant with it, performing it 5 days per week.  Her hip pain is lessened overall and her back/knee pain is ongoing.  She is waiting to hear when her surgery will be.  She reports sleeping better on her L hip and feeling stronger in LEs, core.    OBJECTIVE IMPAIRMENTS: decreased activity tolerance, decreased balance, decreased mobility, difficulty walking, decreased ROM, decreased strength, hypomobility, increased fascial restrictions, impaired flexibility, and pain.    ACTIVITY LIMITATIONS: bending, sitting, standing, squatting, sleeping, stairs, transfers, and locomotion level   PARTICIPATION LIMITATIONS: interpersonal relationship, shopping, and  community activity   PERSONAL FACTORS: Time since onset of injury/illness/exacerbation and 3+ comorbidities: polyarthralgia, fibromyalgia, stress  are also affecting patient's functional outcome.    REHAB POTENTIAL: Excellent   CLINICAL DECISION MAKING: Stable/uncomplicated   EVALUATION COMPLEXITY: Low     GOALS: Goals reviewed with patient? Yes   SHORT TERM GOALS: Target date: 04/13/2022     Pt will be I with HEP for hip strength and core/trunk Baseline: Goal status: MET    2.  Pt will be able to notice increased ease with transfers after sitting short periods (10 min )  Baseline: 04/15/22  knee stiffness  Goal status: MET   3.  Pt will be screened for balance, fall risk and goal set  Baseline:  Goal status: MET      LONG TERM GOALS: Target date: 05/11/2022     Pt will be able to show FOTO score increased to 59%  Baseline: 52% 04/06/22: 57% Goal status: ongoing    2.  Pt will be able to sit for 20-30 min without increasing L hip pain for working on computer, meals  Baseline: 5-10 min , 04/15/22: improved  Goal status: ongoing    3.  Pt will be able to increase lateral hip strength to 4+/5 or better for stability with gait Baseline: 3+/5, 04/15/22: 4/5 Goal status: ongoing    4.  Pt will be able to lie on L side for portion of the night with no more than 4 /10 hip pain .  Baseline: 04/15/22, can do this about 30% better than previous Goal status:ongoing    5.  Patient will be independent in home exercise program upon discharge Baseline:  Goal status: ongoing   6. Patient will be able to complete 2 min walk for 480 feet with min dyspnea, no increased hip pain.    Baseline: 431 feet KNEE PAIN . 05/04/22 465 feet no incr pain in knee or hip   Goal status:MET         PLAN:   PT FREQUENCY: 2x/week   PT DURATION: 8 weeks   PLANNED INTERVENTIONS: Therapeutic exercises, Therapeutic activity, Neuromuscular re-education, Balance training, Gait training, Patient/Family  education, Self Care, Joint mobilization, Aquatic Therapy, Dry Needling, Electrical stimulation, Cryotherapy, Moist  heat, Taping, Ionotophoresis '4mg'$ /ml Dexamethasone, Manual therapy, and Re-evaluation   PLAN FOR NEXT SESSION: Last visit  , DC   Raeford Razor, PT 05/04/22 11:05 AM Phone: [phone] Fax: [phone]

## 2022-05-03 NOTE — Telephone Encounter (Signed)
     Primary Cardiologist: Candee Furbish, MD  Chart reviewed as part of pre-operative protocol coverage. Given past medical history and time since last visit, based on ACC/AHA guidelines, Jaime Chapman would be at acceptable risk for the planned procedure without further cardiovascular testing.   Her RCRI is a class I risk, 0.4% risk of major cardiac event.   I will route this recommendation to the requesting party via Epic fax function and remove from pre-op pool.  Please call with questions.  Jossie Ng. Cherene Dobbins NP-C     05/03/2022, 1:10 PM [place] [address] Office [phone] Fax [phone]

## 2022-05-04 ENCOUNTER — Encounter: Payer: Self-pay | Admitting: Physical Therapy

## 2022-05-04 ENCOUNTER — Ambulatory Visit: Payer: BC Managed Care – PPO | Attending: Family Medicine | Admitting: Physical Therapy

## 2022-05-04 DIAGNOSIS — M6281 Muscle weakness (generalized): Secondary | ICD-10-CM | POA: Insufficient documentation

## 2022-05-04 DIAGNOSIS — M25652 Stiffness of left hip, not elsewhere classified: Secondary | ICD-10-CM | POA: Insufficient documentation

## 2022-05-04 DIAGNOSIS — M25552 Pain in left hip: Secondary | ICD-10-CM | POA: Insufficient documentation

## 2022-05-06 ENCOUNTER — Encounter: Payer: Self-pay | Admitting: Physical Therapy

## 2022-05-06 ENCOUNTER — Ambulatory Visit: Payer: BC Managed Care – PPO | Admitting: Physical Therapy

## 2022-05-06 DIAGNOSIS — M25552 Pain in left hip: Secondary | ICD-10-CM | POA: Diagnosis not present

## 2022-05-06 DIAGNOSIS — M6281 Muscle weakness (generalized): Secondary | ICD-10-CM

## 2022-05-06 DIAGNOSIS — M25652 Stiffness of left hip, not elsewhere classified: Secondary | ICD-10-CM | POA: Diagnosis not present

## 2022-05-06 NOTE — Therapy (Signed)
OUTPATIENT PHYSICAL THERAPY TREATMENT NOTE DISCHARGE   Patient Name: Jaime Chapman MRN: [id number] DOB:1960-10-25, 62 y.o., female Today's Date: 05/06/2022  PCP: Jinny Sanders   REFERRING PROVIDER: Donella Stade PA-C  END OF SESSION:   PT End of Session - 05/06/22 1110     Visit Number 14    Number of Visits 16    Date for PT Re-Evaluation 05/11/22    Authorization Type [employer]    PT Start Time 1107    PT Stop Time 1145    PT Time Calculation (min) 38 min                  Past Medical History:  Diagnosis Date   Allergic rhinitis    Anxiety    Asthma    DDD (degenerative disc disease)    also has spinal arthritis   Depression    Diabetes mellitus    borderline   Dyslipidemia    Fibromyalgia    GERD (gastroesophageal reflux disease)    Hypertension    Lung nodules    RIGHT LUNG--LAST EVALUATED BY CHEST CT 02/01/12 - STABLE AND FOLLOW UP PLANNED IN ONE YEAR.   Lyme disease    Obesity    OSA (obstructive sleep apnea) 05/10/2017   Sinusitis    Stress disorder, acute    Vitamin D deficiency    Past Surgical History:  Procedure Laterality Date   CESAREAN SECTION  1982, 1985   x 2    ENDOMETRIAL ABLATION  2009   HERNIA REPAIR  04/14/12   ventral hernia repair   Laparoscopic ventral hernia repair w/ mesh  08/2009   Dr. Grandville Silos, [facility]   LAPAROSCOPY  2002   TUBAL LIGATION     VENTRAL HERNIA REPAIR  09/2006   Dr. Grandville Silos   VENTRAL HERNIA REPAIR N/A 04/14/2012   Procedure: Laparoscopic Repair of  Ventral Hernia;  Surgeon: Pedro Earls, MD;  Location: [facility];  Service: General;  Laterality: N/A;  Repair of Recurrent Ventral Hernia   Patient Active Problem List   Diagnosis Date Noted   Moderate episode of recurrent major depressive disorder ([place]) 06/03/2020   Attention deficit disorder (ADD) without hyperactivity 03/11/2020   Class 1 drug-induced obesity with serious comorbidity and body mass index (BMI) of 31.0 to 31.9 in adult 09/14/2019   OSA  (obstructive sleep apnea) 05/10/2017   Pre-syncope 03/10/2017   Abnormal CT of the chest 08/31/2016   Panic attacks 04/09/2016   Essential hypertension 07/26/2014   Lyme disease 12/13/2013   Constipation - functional 11/20/2013   Hypothyroidism 11/16/2013   H/O ventral hernia repair 05/01/2012   Pulmonary nodules 01/26/2012   Prediabetes 03/04/2011   Fatty liver disease, nonalcoholic [id number]   Generalized anxiety disorder 10/21/2010   VENTRAL HERNIA-twice recurrent 09/08/2009   Vitamin D deficiency 12/27/2008   Hyperlipidemia 05/22/2007   Moderate persistent asthma 05/22/2007   DEGENERATIVE DISC DISEASE 05/22/2007   Fibromyalgia 05/22/2007   Allergic rhinitis 04/14/2007   GERD 04/14/2007    REFERRING DIAG: M25.552 (ICD-10-CM) - Pain in left hip  THERAPY DIAG:  Pain in left hip  Muscle weakness (generalized)  Stiffness of left hip, not elsewhere classified  Rationale for Evaluation and Treatment Rehabilitation  PERTINENT HISTORY: Fibrolyalgia, Rt knee, Rt hip pain .  Bariatric surgery.  Back pain.  See above.   PRECAUTIONS: None  SUBJECTIVE:  SUBJECTIVE STATEMENT: No hip pain.  Back is hurting today, 4/10.     PAIN:  Are you having pain? Yes: NPRS scale: none/10 Pain location: L hip Pain description: achy Aggravating factors: sitting, overactivity  Relieving factors: heat pad, OTC tylenol, changing position, injection Severe Rt knee pain, 8/10 at times   05/04/22: Back is 3/10, knee is 5/10.    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 No neural impingement to explain leg symptoms.   MRI HIP 01/19/22: 1. Superior labral degeneration with a tear. 2. Mild  tendinosis of the left gluteus minimus tendon insertion. 3. Mild tendinosis of the left hamstring origin.   PATIENT SURVEYS:  FOTO 52% FOTO 57% 04/06/22   COGNITION: Overall cognitive status: Within functional limits for tasks assessed                         SENSATION: WFL   EDEMA:  NT   MUSCLE LENGTH: Hamstrings: Good  Thomas test: NT   POSTURE: rounded shoulders, forward head, weight shift left, and genu varus    PALPATION: Tender TTP at left greater trochanter extending to mid thigh.  Pain in gluteus minimus, medius and piriformis muscle with palpation   LOWER EXTREMITY ROM:   Passive ROM Right eval Left eval  Hip flexion WNL WNL  Hip extension      Hip abduction      Hip adduction      Hip internal rotation decreased decreased  Hip external rotation Min Pain Pain   Knee flexion      Knee extension      Ankle dorsiflexion      Ankle plantarflexion      Ankle inversion      Ankle eversion       (Blank rows = not tested)   LOWER EXTREMITY MMT:   MMT Right eval Left eval Rt./Lt.  04/15/22 Rt/Lt 05/06/22  Hip flexion 4/5 4/5 4+/5 4+/5  Hip extension      4+/5  Hip abduction 3+/5 3+/5 4/5 4+/5  Hip adduction        Hip internal rotation        Hip external rotation        Knee flexion 5/5 5/5  5/5, 5/5  Knee extension 4/5 5/5    Ankle dorsiflexion        Ankle plantarflexion        Ankle inversion        Ankle eversion         (Blank rows = not tested)   LOWER EXTREMITY SPECIAL TESTS:  Hip special tests: Saralyn Pilar (FABER) test: negative and Hip scouring test: negative   FUNCTIONAL TESTS:  5 times sit to stand: 12.7 sec  30 seconds chair stand test Timed up and go (TUG): NT 2 minute walk test: NT   GAIT: Distance walked: 100 Assistive device utilized: None Level of assistance: Complete Independence Comments: not Remarkable     TODAY'S TREATMENT:    [facility] Adult PT Treatment:                                                DATE:  05/06/22 Therapeutic Exercise: Supine knee to chest, piriformis, active knee extension Sideplank 30 sec each side  Hip extension x 12  Sidelying hip abduction  x 10  Lower ab work 90/90 march, reverse to taps.    Modalities: IFC for demo on Rt knee and lumbar spine , brief 10 min total  Self Care: TENS for pain relief and self care, pain control/how it works  Progress with PT and goals   FOTO  HEP review    [facility] Adult PT Treatment:                                                DATE: 05/04/22 Therapeutic Exercise: Standing core: Palloff press blue band and rotation  Neutral core used Dynadisc under pelvis Unilateral bent knee fall out 2 sets x 10 (in mini bridge) 90/90 hold 30 sec  UE and LE reach x 10 each side (ipsilateral) Sideplank 10 sec x 3 static bilateral  Kneeling plank variations  High plank on mat table x 30 sec  2 min walk no increased knee pain 465 feet  Wall squat to chest press x 10 , 8 lbs  Slider lunges x 10 each side  Supine hip stretching , piriformis, wide knees LTR with hamstring stretch   [facility] Adult PT Treatment:                                                DATE: 04/29/22 Therapeutic Exercise: Sink stretch  Squat x 15 Hip abduction and extension red T band at ankles  Banded knee ext red band x 15  unable to do this on Rt LE  SLR with core focus  x 10  90/90 hold with towel under hips  90/90 hold with arm lift alternating , then LE toe taps  x 10 Bridging x 10 no pain Hip hinge single and double leg standing using 10 lbs KB  Self Care: POC, consider PT visit limit   [facility] Adult PT Treatment:                                                DATE: 04/27/22 Therapeutic Exercise: Supine A/P tilt with ball under pelvis  March and clam for stability SLR ball under pelvis  Sidelying hip abduction with sidekicks  Sidelying hip circles  Quadruped green band bird dog x 10 each side (band on foot and opp UE)  Wall sit, squat with ball behind back and Pilates  circle  Isometric staggered wall sit 15 sec x 3  Heel raise with circle x 10  Wide standing lunge with upper body rotation.     [facility] Adult PT Treatment:                                                DATE: 04/15/22 Therapeutic Exercise: Sidelying hip abduction x 15  Sidelying clam x 15  Abduction hold with knee extension  Upper trunk rotation x 5  Hamstring, ITB stretch with strap 30 sec  Core stability in supine: ball under pelvis  March ,  bent knee fall out , overhead lift using Pilates circle, isometric circle press with March Core stability in standing: hinge with 10 lbs KB pass (lateral) KB circle pass 1 min x 2 (feet in tandem)  Wall sit 60 sec with 10 lbs KB                PATIENT EDUCATION:  Education details: PT/POC, bursitis, MRI report, aquatics  Person educated: Patient Education method: Theatre stage manager Education comprehension: verbalized understanding   HOME EXERCISE PROGRAM:  Access Code: [facility] URL: [email or web address] Date: 03/24/2022 Prepared by: Raeford Razor  Exercises - Supine Lower Trunk Rotation  - 1 x daily - 7 x weekly - 2 sets - 10 reps - 10 hold - Seated Hamstring Stretch  - 1 x daily - 7 x weekly - 1 sets - 3 reps - 30 hold - Seated Piriformis Stretch  - 1 x daily - 7 x weekly - 1 sets - 3 reps - 20-30 hold - Standing Hip Flexor Stretch  - 1 x daily - 7 x weekly - 1 sets - 3 reps - 20-30 hold - Bridge with Hip Abduction and Resistance  - 1 x daily - 7 x weekly - 2 sets - 10 reps - 5 hold - Clamshell with Resistance  - 1 x daily - 7 x weekly - 2 sets - 10 reps - 5 hold -Hip abduction  90/90 abs Sideplank   ASSESSMENT:   CLINICAL IMPRESSION: Patient has met the majority of her goals.  She has improved her hip strength to 4+/5.  She suffers mostly from Rt knee and lower back pain .  She plans to get her knee replaced in the coming months.  We discussed her using her home TENS unit for her muscular pain to allow her  some relief when needed.  She will likely return to PT for that in the future. DC from PT.    OBJECTIVE IMPAIRMENTS: decreased activity tolerance, decreased balance, decreased mobility, difficulty walking, decreased ROM, decreased strength, hypomobility, increased fascial restrictions, impaired flexibility, and pain.    ACTIVITY LIMITATIONS: bending, sitting, standing, squatting, sleeping, stairs, transfers, and locomotion level   PARTICIPATION LIMITATIONS: interpersonal relationship, shopping, and community activity   PERSONAL FACTORS: Time since onset of injury/illness/exacerbation and 3+ comorbidities: polyarthralgia, fibromyalgia, stress  are also affecting patient's functional outcome.    REHAB POTENTIAL: Excellent   CLINICAL DECISION MAKING: Stable/uncomplicated   EVALUATION COMPLEXITY: Low     GOALS: Goals reviewed with patient? Yes   SHORT TERM GOALS: Target date: 04/13/2022     Pt will be I with HEP for hip strength and core/trunk Baseline: Goal status: MET    2.  Pt will be able to notice increased ease with transfers after sitting short periods (10 min )  Baseline: 04/15/22  knee stiffness  Goal status: MET   3.  Pt will be screened for balance, fall risk and goal set  Baseline:  Goal status: MET      LONG TERM GOALS: Target date: 05/11/2022     Pt will be able to show FOTO score increased to 59%  Baseline: 52% 04/06/22: 57%, 60% Goal status: MET    2.  Pt will be able to sit for 20-30 min without increasing L hip pain for working on computer, meals  Baseline: 5-10 min , 04/15/22: improved  Goal status: MET    3.  Pt will be able to increase lateral hip strength to 4+/5 or better for stability with  gait Baseline: 3+/5, 04/15/22: 4/5 Goal status: MET    4.  Pt will be able to lie on L side for portion of the night with no more than 4 /10 hip pain .  Baseline: 04/15/22, can do this about 30% better than previous Goal status: Partially met     5.  Patient will  be independent in home exercise program upon discharge Baseline:  Goal status:MET   6. Patient will be able to complete 2 min walk for 480 feet with min dyspnea, no increased hip pain.    Baseline: 431 feet KNEE PAIN . 05/04/22 465 feet no incr pain in knee or hip   Goal status:MET         PLAN:   PT FREQUENCY: 2x/week   PT DURATION: 8 weeks   PLANNED INTERVENTIONS: Therapeutic exercises, Therapeutic activity, Neuromuscular re-education, Balance training, Gait training, Patient/Family education, Self Care, Joint mobilization, Aquatic Therapy, Dry Needling, Electrical stimulation, Cryotherapy, Moist heat, Taping, Ionotophoresis '4mg'$ /ml Dexamethasone, Manual therapy, and Re-evaluation   PLAN FOR NEXT SESSION:  NA, DC  Raeford Razor, PT 05/06/22 1:02 PM Phone: [phone] Fax: [phone]     PHYSICAL THERAPY DISCHARGE SUMMARY  Visits from Start of Care: 14  Current functional level related to goals / functional outcomes: See above    Remaining deficits: Knee and back pain, core weakness   Education / Equipment: HEP, gym exercises, strength, posture and core    Patient agrees to discharge. Patient goals were met. Patient is being discharged due to being pleased with the current functional level.   Raeford Razor, PT 05/06/22 1:04 PM Phone: [phone] Fax: [phone]

## 2022-05-11 ENCOUNTER — Encounter: Payer: Self-pay | Admitting: Family Medicine

## 2022-05-11 ENCOUNTER — Ambulatory Visit: Payer: BC Managed Care – PPO | Admitting: Family Medicine

## 2022-05-11 VITALS — BP 102/60 | HR 83 | Temp 98.0°F | Ht 64.0 in | Wt 191.4 lb

## 2022-05-11 DIAGNOSIS — Z01818 Encounter for other preprocedural examination: Secondary | ICD-10-CM | POA: Diagnosis not present

## 2022-05-11 NOTE — Progress Notes (Signed)
Patient ID: Jaime Chapman, female    DOB: 10/03/1960, 62 y.o.   MRN: [id number]  This visit was conducted in person.  BP 102/60   Pulse 83   Temp 98 F (36.7 C) (Temporal)   Ht '5\' 4"'$  (1.626 m)   Wt 191 lb 6 oz (86.8 kg)   SpO2 100%   BMI 32.85 kg/m    CC:  Chief Complaint  Patient presents with   Surgical Clearance    Subjective:   HPI: Jaime Chapman is a 62 y.o. female presenting on 05/11/2022 for Surgical Clearance For planned upcoming right total knee arthroplasty with Dr. Pilar Plate [facility] planned August 17, 2022. Referred to Cardiology for clearance given history of  hypertensive urgency. Reviewed last OV 04/27/22 Overall CV risk is low especially with normal coronary CTA in 2020.   Hypertension:   Well controlled on losartan hydrochlorothiazide 50/12.5 mg p.o. daily BP Readings from Last 3 Encounters:  05/11/22 102/60  04/27/22 112/64  03/17/22 130/70  Using medication without problems or lightheadedness:  none Chest pain with exertion: none Edema: none Short of breath: none  Average home BPs: Other issues:   S/P bariatric surgery 2022.. has lost significant weight .  No complication with healing or surgery, intubation etc. Wt Readings from Last 3 Encounters:  05/11/22 191 lb 6 oz (86.8 kg)  04/27/22 184 lb 9.6 oz (83.7 kg)  03/17/22 187 lb 4 oz (84.9 kg)    She was able  walk up 2 flights of stairs do without shortness of breath.  No CP.  Hx of moderate persistent asthma,  rare use of inhalers. OSA, resolved with weight loss.   Relevant past medical, surgical, family and social history reviewed and updated as indicated. Interim medical history since our last visit reviewed. Allergies and medications reviewed and updated. Outpatient Medications Prior to Visit  Medication Sig Dispense Refill   albuterol (VENTOLIN HFA) 108 (90 Base) MCG/ACT inhaler INHALE 2 PUFFS INTO THE LUNGS EVERY 4 HOURS AS NEEDED FOR WHEEIZNG 18 g 3   hydrochlorothiazide (HYDRODIURIL)  12.5 MG tablet TAKE 1 TABLET BY MOUTH EVERY DAY 30 tablet 11   levothyroxine (SYNTHROID) 25 MCG tablet Take 1 tablet (25 mcg total) by mouth daily. 30 tablet 11   losartan-hydrochlorothiazide (HYZAAR) 50-12.5 MG tablet Take 1 tablet by mouth daily. 90 tablet 3   Fezolinetant (VEOZAH) 45 MG TABS Take 45 mg by mouth daily.     No facility-administered medications prior to visit.     Per HPI unless specifically indicated in ROS section below Review of Systems  Constitutional:  Negative for fatigue and fever.  HENT:  Negative for congestion.   Eyes:  Negative for pain.  Respiratory:  Negative for cough and shortness of breath.   Cardiovascular:  Negative for chest pain, palpitations and leg swelling.  Gastrointestinal:  Negative for abdominal pain.  Genitourinary:  Negative for dysuria and vaginal bleeding.  Musculoskeletal:  Negative for back pain.  Neurological:  Negative for syncope, light-headedness and headaches.  Psychiatric/Behavioral:  Negative for dysphoric mood.    Objective:  BP 102/60   Pulse 83   Temp 98 F (36.7 C) (Temporal)   Ht '5\' 4"'$  (1.626 m)   Wt 191 lb 6 oz (86.8 kg)   SpO2 100%   BMI 32.85 kg/m   Wt Readings from Last 3 Encounters:  05/11/22 191 lb 6 oz (86.8 kg)  04/27/22 184 lb 9.6 oz (83.7 kg)  03/17/22 187 lb 4 oz (84.9  kg)      Physical Exam Constitutional:      General: She is not in acute distress.    Appearance: Normal appearance. She is well-developed. She is not ill-appearing or toxic-appearing.  HENT:     Head: Normocephalic.     Right Ear: Hearing, tympanic membrane, ear canal and external ear normal. Tympanic membrane is not erythematous, retracted or bulging.     Left Ear: Hearing, tympanic membrane, ear canal and external ear normal. Tympanic membrane is not erythematous, retracted or bulging.     Nose: No mucosal edema or rhinorrhea.     Right Sinus: No maxillary sinus tenderness or frontal sinus tenderness.     Left Sinus: No maxillary  sinus tenderness or frontal sinus tenderness.     Mouth/Throat:     Pharynx: Uvula midline.  Eyes:     General: Lids are normal. Lids are everted, no foreign bodies appreciated.     Conjunctiva/sclera: Conjunctivae normal.     Pupils: Pupils are equal, round, and reactive to light.  Neck:     Thyroid: No thyroid mass or thyromegaly.     Vascular: No carotid bruit.     Trachea: Trachea normal.  Cardiovascular:     Rate and Rhythm: Normal rate and regular rhythm.     Pulses: Normal pulses.     Heart sounds: Normal heart sounds, S1 normal and S2 normal. No murmur heard.    No friction rub. No gallop.  Pulmonary:     Effort: Pulmonary effort is normal. No tachypnea or respiratory distress.     Breath sounds: Normal breath sounds. No decreased breath sounds, wheezing, rhonchi or rales.  Abdominal:     General: Bowel sounds are normal.     Palpations: Abdomen is soft.     Tenderness: There is no abdominal tenderness.  Musculoskeletal:     Cervical back: Normal range of motion and neck supple.  Skin:    General: Skin is warm and dry.     Findings: No rash.  Neurological:     Mental Status: She is alert.  Psychiatric:        Mood and Affect: Mood is not anxious or depressed.        Speech: Speech normal.        Behavior: Behavior normal. Behavior is cooperative.        Thought Content: Thought content normal.        Judgment: Judgment normal.       Results for orders placed or performed in visit on 01/12/22  T3, free  Result Value Ref Range   T3, Free 3.3 2.3 - 4.2 pg/mL  T4, free  Result Value Ref Range   Free T4 0.67 0.60 - 1.60 ng/dL  TSH  Result Value Ref Range   TSH 4.04 0.35 - 5.50 uIU/mL    Assessment and Plan  Pre-op evaluation Assessment & Plan: Able to do 4-7 METS of physical activity without any difficulty other than limitations with her knee. Her blood pressure has been very well-controlled.  Last check of prediabetes showed an A1c of 5.7. Resolution of  obstructive sleep apnea with significant weight loss following bariatric surgery.  Also improvement of asthma with rare episodes.  No past known complications associated with surgery or intubation.  Optimized from a medical and cardiac standpoint and low risk for upcoming total knee arthroplasty.     No follow-ups on file.   Eliezer Lofts, MD

## 2022-05-11 NOTE — Assessment & Plan Note (Signed)
Able to do 4-7 METS of physical activity without any difficulty other than limitations with her knee. Her blood pressure has been very well-controlled.  Last check of prediabetes showed an A1c of 5.7. Resolution of obstructive sleep apnea with significant weight loss following bariatric surgery.  Also improvement of asthma with rare episodes.  No past known complications associated with surgery or intubation.  Optimized from a medical and cardiac standpoint and low risk for upcoming total knee arthroplasty.

## 2022-05-14 DIAGNOSIS — M25561 Pain in right knee: Secondary | ICD-10-CM | POA: Diagnosis not present

## 2022-06-11 DIAGNOSIS — E039 Hypothyroidism, unspecified: Secondary | ICD-10-CM | POA: Diagnosis not present

## 2022-06-11 DIAGNOSIS — R5383 Other fatigue: Secondary | ICD-10-CM | POA: Diagnosis not present

## 2022-06-11 DIAGNOSIS — I10 Essential (primary) hypertension: Secondary | ICD-10-CM | POA: Diagnosis not present

## 2022-06-11 DIAGNOSIS — E669 Obesity, unspecified: Secondary | ICD-10-CM | POA: Diagnosis not present

## 2022-06-11 DIAGNOSIS — R0602 Shortness of breath: Secondary | ICD-10-CM | POA: Diagnosis not present

## 2022-06-11 DIAGNOSIS — N951 Menopausal and female climacteric states: Secondary | ICD-10-CM | POA: Diagnosis not present

## 2022-06-11 DIAGNOSIS — E785 Hyperlipidemia, unspecified: Secondary | ICD-10-CM | POA: Diagnosis not present

## 2022-06-16 DIAGNOSIS — R41840 Attention and concentration deficit: Secondary | ICD-10-CM | POA: Diagnosis not present

## 2022-06-23 DIAGNOSIS — R41840 Attention and concentration deficit: Secondary | ICD-10-CM | POA: Diagnosis not present

## 2022-06-23 DIAGNOSIS — F338 Other recurrent depressive disorders: Secondary | ICD-10-CM | POA: Diagnosis not present

## 2022-06-23 DIAGNOSIS — F419 Anxiety disorder, unspecified: Secondary | ICD-10-CM | POA: Diagnosis not present

## 2022-07-12 DIAGNOSIS — K912 Postsurgical malabsorption, not elsewhere classified: Secondary | ICD-10-CM | POA: Diagnosis not present

## 2022-07-12 DIAGNOSIS — Z903 Acquired absence of stomach [part of]: Secondary | ICD-10-CM | POA: Diagnosis not present

## 2022-07-12 DIAGNOSIS — E039 Hypothyroidism, unspecified: Secondary | ICD-10-CM | POA: Diagnosis not present

## 2022-07-12 DIAGNOSIS — I10 Essential (primary) hypertension: Secondary | ICD-10-CM | POA: Diagnosis not present

## 2022-07-19 ENCOUNTER — Ambulatory Visit (INDEPENDENT_AMBULATORY_CARE_PROVIDER_SITE_OTHER): Payer: BC Managed Care – PPO | Admitting: Psychology

## 2022-07-19 DIAGNOSIS — F411 Generalized anxiety disorder: Secondary | ICD-10-CM | POA: Diagnosis not present

## 2022-07-19 DIAGNOSIS — F331 Major depressive disorder, recurrent, moderate: Secondary | ICD-10-CM | POA: Diagnosis not present

## 2022-07-19 NOTE — Progress Notes (Unsigned)
? ? ? ? ? ? ? ? ? ? ? ? ? ? ?  Jaime Chapman, LCMHC ?
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
## 2022-07-20 DIAGNOSIS — Z0189 Encounter for other specified special examinations: Secondary | ICD-10-CM | POA: Diagnosis not present

## 2022-07-29 ENCOUNTER — Ambulatory Visit (INDEPENDENT_AMBULATORY_CARE_PROVIDER_SITE_OTHER): Payer: BC Managed Care – PPO | Admitting: Psychology

## 2022-07-29 DIAGNOSIS — F902 Attention-deficit hyperactivity disorder, combined type: Secondary | ICD-10-CM | POA: Diagnosis not present

## 2022-07-29 DIAGNOSIS — F331 Major depressive disorder, recurrent, moderate: Secondary | ICD-10-CM | POA: Diagnosis not present

## 2022-07-29 DIAGNOSIS — F411 Generalized anxiety disorder: Secondary | ICD-10-CM | POA: Diagnosis not present

## 2022-07-29 DIAGNOSIS — H40033 Anatomical narrow angle, bilateral: Secondary | ICD-10-CM | POA: Diagnosis not present

## 2022-07-29 DIAGNOSIS — Z79899 Other long term (current) drug therapy: Secondary | ICD-10-CM | POA: Diagnosis not present

## 2022-07-29 DIAGNOSIS — H2513 Age-related nuclear cataract, bilateral: Secondary | ICD-10-CM | POA: Diagnosis not present

## 2022-07-29 NOTE — Progress Notes (Signed)
[facility] Counselor/Therapist Progress Note  Patient ID: Jaime Chapman, MRN: [id number],    Date: 07/29/2022  Time Spent: 2:30pm-3:30pm     Treatment Type: Individual Therapy  pt is seen for a virtual video visit via caregility.  Pt joins from her car, reporting privacy, and cousnelor from her home office.    Reported Symptoms: pt reports some decreased depressed mood and less overwhelmed.    Mental Status Exam: Appearance:  Well Groomed     Behavior: Appropriate  Motor: Normal  Speech/Language:  Clear and Coherent  Affect: Congruent  Mood: anxious and depressed  Thought process: normal  Thought content:   WNL  Sensory/Perceptual disturbances:   WNL  Orientation: oriented to person, place, time/date, and situation  Attention: Good  Concentration: Good  Memory: WNL  Fund of knowledge:  Good  Insight:   Good  Judgment:  Good  Impulse Control: Good   Risk Assessment: Danger to Self:  No Self-injurious Behavior: No Danger to Others: No Duty to Warn:no Physical Aggression / Violence:No  Access to Firearms a concern: No  Gang Involvement:No   Subjective: Counselor assessed pt current functioning per pt report. Processed w/pt mood,stressors and positive.  Explored w/pt recent stressors w/ interactions at home.  Discussed positives of self care and time for self.  Encouraged pt to continue making as priority.  Explored w/ pt guilt re: relationship and infidelity.  Pt affect wnl.  Pt reported that she has been feeling less down and less overwhelmed since last visit.  Pt reports that she has been trying to take more time for self outside and focus on her self care.  Pt reported on potential beach trip planning.  Pt reported on granddaughter emotional escalation the other day and stressors re: Pt express feelings of guilt re: relationship and discussing relationship stressors.    Interventions: Cognitive Behavioral Therapy and ACT  Diagnosis:Moderate episode of  recurrent major depressive disorder (HCC)  Generalized anxiety disorder  Plan: Pt to f/u w/ weekly to biweekly counseling.  Pt to f/u w/ referral to psychiatrist.    Individualized Treatment Plan Strengths: seeking counseling  Supports: husband    Goal/Needs for Treatment:  In order of importance to patient 1) cope w/ depression and anxiety 2) --- 3) ---    Client Statement of Needs: "Seeking safe space to talk through things. Find out why I do the things I do.  I self sabotage myself a lot and want to change."    Treatment Level:outpatient counseling  Symptoms:depressed mood, low motivation/loss of interest, fatigue, negative self worth  Client Treatment Preferences:weekly counseling.  F/u w/ a psychiatrist.    Healthcare consumer's goal for treatment:   Counselor, Jaime Chapman, [facility] will support the patient's ability to achieve the goals identified. Cognitive Behavioral Therapy, Assertive Communication/Conflict Resolution Training, Relaxation Training, ACT, Humanistic and other evidenced-based practices will be used to promote progress towards healthy functioning.    Healthcare consumer will: Actively participate in therapy, working towards healthy functioning.     *Justification for Continuation/Discontinuation of Goal: R=Revised, O=Ongoing, A=Achieved, D=Discontinued   Goal 1) Increase awareness of thoughts and behaviors that impacting depression and anxiety and increase healthier thought patterns, increase positive self care and effective coping skills. Baseline date 07/19/22: Progress towards goal 0; How Often - Daily Target Date Goal Was reviewed Status Code Progress towards goal/Likert rating  07/19/23  This plan has been reviewed and created by the following participants:  This plan will be reviewed at least every 12 months. Date Behavioral Health Clinician Date Guardian/Patient   07/19/22  [facility] Jaime Chapman [facility] 07/19/22 Verbal Consent Provided                                Jaime Chapman [facility]

## 2022-07-29 NOTE — Progress Notes (Deleted)
? ? ? ? ? ? ? ? ? ? ? ? ? ? ?  Jaime Chapman, LCMHC ?

## 2022-08-02 DIAGNOSIS — F902 Attention-deficit hyperactivity disorder, combined type: Secondary | ICD-10-CM | POA: Diagnosis not present

## 2022-08-04 ENCOUNTER — Ambulatory Visit: Payer: BC Managed Care – PPO | Admitting: Psychology

## 2022-08-05 ENCOUNTER — Ambulatory Visit (INDEPENDENT_AMBULATORY_CARE_PROVIDER_SITE_OTHER): Payer: BC Managed Care – PPO | Admitting: Psychology

## 2022-08-05 DIAGNOSIS — F411 Generalized anxiety disorder: Secondary | ICD-10-CM | POA: Diagnosis not present

## 2022-08-05 DIAGNOSIS — F331 Major depressive disorder, recurrent, moderate: Secondary | ICD-10-CM | POA: Diagnosis not present

## 2022-08-05 NOTE — Progress Notes (Signed)
[facility] Counselor/Therapist Progress Note  Patient ID: Jaime Chapman, MRN: [id number],    Date: 08/05/2022  Time Spent: 8:00am-8:58am     Treatment Type: Individual Therapy  pt is seen for a virtual video visit via caregility.  Pt joins from her car, reporting privacy, and cousnelor from her office.    Reported Symptoms: pt reports less depressed and overwhelmed, struggling w/ initiating/organization.    Mental Status Exam: Appearance:  Well Groomed     Behavior: Appropriate  Motor: Normal  Speech/Language:  Clear and Coherent  Affect: Congruent  Mood: anxious and depressed  Thought process: normal  Thought content:   WNL  Sensory/Perceptual disturbances:   WNL  Orientation: oriented to person, place, time/date, and situation  Attention: Good  Concentration: Good  Memory: WNL  Fund of knowledge:  Good  Insight:   Good  Judgment:  Good  Impulse Control: Good   Risk Assessment: Danger to Self:  No Self-injurious Behavior: No Danger to Others: No Duty to Warn:no Physical Aggression / Violence:No  Access to Firearms a concern: No  Gang Involvement:No   Subjective: Counselor assessed pt current functioning per pt report. Processed w/pt stressors and positives.  Explored w/pt recent self care and taking time for relaxation and destressing.  Discussed struggles w/ organization and initiating activities. Explored w/ pt worries for husband's health and grandson's transition.  Pt affect wnl.  Pt reported she continues to feel less down and less overwhelmed since last visit.  Pt reports ways she is taking time for self care.  Pt reported increased creative projects pursuits.  Pt reports that she is struggling w/ organization and initiating.  Pt reports her ADHD meds haven't benefited this.  Pt reports worry for husband's health and prognosis and w/ son's graduation and transition from school and into adulthood.  Pt discussed looking forward to short beach trip prior  to her knee surgery.    Interventions: Cognitive Behavioral Therapy and ACT  Diagnosis:Moderate episode of recurrent major depressive disorder (HCC)  Generalized anxiety disorder  Plan: Pt to f/u w/ weekly to biweekly counseling.  Pt to f/u w/ referral to psychiatrist.    Individualized Treatment Plan Strengths: seeking counseling  Supports: husband    Goal/Needs for Treatment:  In order of importance to patient 1) cope w/ depression and anxiety 2) --- 3) ---    Client Statement of Needs: "Seeking safe space to talk through things. Find out why I do the things I do.  I self sabotage myself a lot and want to change."    Treatment Level:outpatient counseling  Symptoms:depressed mood, low motivation/loss of interest, fatigue, negative self worth  Client Treatment Preferences:weekly counseling.  F/u w/ a psychiatrist.    Healthcare consumer's goal for treatment:   Counselor, [name], [facility] will support the patient's ability to achieve the goals identified. Cognitive Behavioral Therapy, Assertive Communication/Conflict Resolution Training, Relaxation Training, ACT, Humanistic and other evidenced-based practices will be used to promote progress towards healthy functioning.    Healthcare consumer will: Actively participate in therapy, working towards healthy functioning.     *Justification for Continuation/Discontinuation of Goal: R=Revised, O=Ongoing, A=Achieved, D=Discontinued   Goal 1) Increase awareness of thoughts and behaviors that impacting depression and anxiety and increase healthier thought patterns, increase positive self care and effective coping skills. Baseline date 07/19/22: Progress towards goal 0; How Often - Daily Target Date Goal Was reviewed Status Code Progress towards goal/Likert rating  07/19/23  This plan has been reviewed and created by the following participants:  This plan will be reviewed at least every 12 months. Date  Behavioral Health Clinician Date Guardian/Patient   07/19/22  [facility] [name] 07/19/22 Verbal Consent Provided                                 [name]

## 2022-08-06 ENCOUNTER — Ambulatory Visit
Admission: EM | Admit: 2022-08-06 | Discharge: 2022-08-06 | Disposition: A | Discharge status: Home / Self Care | Payer: BC Managed Care – PPO | Attending: Family Medicine | Admitting: Family Medicine

## 2022-08-06 DIAGNOSIS — L500 Allergic urticaria: Secondary | ICD-10-CM

## 2022-08-06 MED ORDER — FAMOTIDINE 20 MG PO TABS: 20.0000 mg | ORAL_TABLET | Freq: Two times a day (BID) | ORAL | 0 refills | Status: DC

## 2022-08-06 MED ORDER — METHYLPREDNISOLONE SODIUM SUCC 125 MG IJ SOLR: 80.0000 mg | Freq: Once | INTRAMUSCULAR | Status: DC

## 2022-08-06 MED ORDER — METHYLPREDNISOLONE ACETATE 80 MG/ML IJ SUSP
80.0000 mg | Freq: Once | INTRAMUSCULAR | Status: AC
  Administered 2022-08-06: 80 mg via INTRAMUSCULAR

## 2022-08-06 MED ORDER — FAMOTIDINE 20 MG PO TABS
20.0000 mg | ORAL_TABLET | Freq: Once | ORAL | Status: AC
  Administered 2022-08-06: 20 mg via ORAL

## 2022-08-06 MED ORDER — DIPHENHYDRAMINE HCL 25 MG PO CAPS
25.0000 mg | ORAL_CAPSULE | Freq: Once | ORAL | Status: AC
  Administered 2022-08-06: 25 mg via ORAL

## 2022-08-06 MED ORDER — PREDNISONE 20 MG PO TABS: 40.0000 mg | ORAL_TABLET | Freq: Every day | ORAL | 0 refills | Status: AC

## 2022-08-06 NOTE — ED Triage Notes (Signed)
Pt reports she has itching and red hives all over her body, and weakness, lightheaded and dizziness at 4:30pm today.  Pt states she got bit by ants. Had this happen before and had the same reaction with fire ants but not as severe. Took benadryl.

## 2022-08-06 NOTE — ED Provider Notes (Signed)
[facility]    CSN: [id number] Arrival date & time: 08/06/22  1815      History   Chief Complaint Chief Complaint  Patient presents with   Allergic Reaction    HPI Jaime Chapman is a 62 y.o. female.    Allergic Reaction  Here for itching and hives.  About 2 hours ago she was bitten on the foot by ants.  Within about 20 minutes she began having rash spread throughout her body.  She is itching a lot.  No shortness of breath and no sensation that her tongue or lips are swollen.  No feeling that her throat is closing.  No nausea or vomiting  She does note a little dizziness, but does wonder if that was the Benadryl she took.  She has taken 25 mg of Benadryl already. Past Medical History:  Diagnosis Date   Allergic rhinitis    Anxiety    Asthma    DDD (degenerative disc disease)    also has spinal arthritis   Depression    Diabetes mellitus    borderline   Dyslipidemia    Fibromyalgia    GERD (gastroesophageal reflux disease)    Hypertension    Lung nodules    RIGHT LUNG--LAST EVALUATED BY CHEST CT 02/01/12 - STABLE AND FOLLOW UP PLANNED IN ONE YEAR.   Lyme disease    Obesity    OSA (obstructive sleep apnea) 05/10/2017   Sinusitis    Stress disorder, acute    Vitamin D deficiency     Patient Active Problem List   Diagnosis Date Noted   Pre-op evaluation 05/11/2022   Moderate episode of recurrent major depressive disorder (HCC) 06/03/2020   Attention deficit disorder (ADD) without hyperactivity 03/11/2020   Class 1 drug-induced obesity with serious comorbidity and body mass index (BMI) of 31.0 to 31.9 in adult 09/14/2019   OSA (obstructive sleep apnea) 05/10/2017   Pre-syncope 03/10/2017   Abnormal CT of the chest 08/31/2016   Panic attacks 04/09/2016   Essential hypertension 07/26/2014   Lyme disease 12/13/2013   Constipation - functional 11/20/2013   Hypothyroidism 11/16/2013   H/O ventral hernia repair 05/01/2012   Pulmonary nodules  01/26/2012   Prediabetes 03/04/2011   Fatty liver disease, nonalcoholic 10/28/2010   Generalized anxiety disorder 10/21/2010   VENTRAL HERNIA-twice recurrent 09/08/2009   Vitamin D deficiency 12/27/2008   Hyperlipidemia 05/22/2007   Moderate persistent asthma 05/22/2007   DEGENERATIVE DISC DISEASE 05/22/2007   Fibromyalgia 05/22/2007   Allergic rhinitis 04/14/2007   GERD 04/14/2007    Past Surgical History:  Procedure Laterality Date   CESAREAN SECTION  1982, 1985   x 2    ENDOMETRIAL ABLATION  2009   HERNIA REPAIR  04/14/12   ventral hernia repair   Laparoscopic ventral hernia repair w/ mesh  08/2009   Dr. Janee Morn, [facility]   LAPAROSCOPY  2002   TUBAL LIGATION     VENTRAL HERNIA REPAIR  09/2006   Dr. Patricia Pesa HERNIA REPAIR N/A 04/14/2012   Procedure: Laparoscopic Repair of  Ventral Hernia;  Surgeon: Valarie Merino, MD;  Location: [facility];  Service: General;  Laterality: N/A;  Repair of Recurrent Ventral Hernia    OB History   No obstetric history on file.      Home Medications    Prior to Admission medications   Medication Sig Start Date End Date Taking? Authorizing Provider  estradiol (ESTRACE) 1 MG tablet PLEASE SEE ATTACHED FOR DETAILED DIRECTIONS 07/10/22  Yes [provider]  famotidine (PEPCID) 20 MG tablet Take 1 tablet (20 mg total) by mouth 2 (two) times daily. 08/06/22  Yes Zenia Resides, MD  methylphenidate (METADATE CD) 20 MG CR capsule Take 20 mg by mouth daily. 07/29/22  Yes [provider]  predniSONE (DELTASONE) 20 MG tablet Take 2 tablets (40 mg total) by mouth daily with breakfast for 5 days. 08/06/22 08/11/22 Yes Nerine Pulse, Janace Aris, MD  progesterone (PROMETRIUM) 100 MG capsule PLEASE SEE ATTACHED FOR DETAILED DIRECTIONS 07/10/22  Yes [provider]  albuterol (VENTOLIN HFA) 108 (90 Base) MCG/ACT inhaler INHALE 2 PUFFS INTO THE LUNGS EVERY 4 HOURS AS NEEDED FOR [facility] 12/14/18   Parrett, Tammy S, NP  hydrochlorothiazide  (HYDRODIURIL) 12.5 MG tablet TAKE 1 TABLET BY MOUTH EVERY DAY 03/31/22   Bedsole, [name] E, MD  levothyroxine (SYNTHROID) 25 MCG tablet Take 1 tablet (25 mcg total) by mouth daily. 12/15/21   Bedsole, [name] E, MD  losartan-hydrochlorothiazide (HYZAAR) 50-12.5 MG tablet Take 1 tablet by mouth daily. 10/15/21   Excell Seltzer, MD    Family History Family History  Problem Relation Age of Onset   Heart failure Mother    Diabetes Mother    Heart disease Mother 57   Arthritis Mother    Depression Mother    Hearing loss Mother    Hypertension Mother    Obesity Mother    Alzheimer's disease Father    Lung cancer Maternal Grandmother    Cancer Maternal Grandmother        lung   Colon cancer Neg Hx     Social History Social History   Tobacco Use   Smoking status: Former    Types: Cigarettes    Quit date: 03/01/2004    Years since quitting: 18.4    Passive exposure: Past   Smokeless tobacco: Never  Vaping Use   Vaping Use: Never used  Substance Use Topics   Alcohol use: No    Alcohol/week: 1.0 standard drink of alcohol    Types: 1 Standard drinks or equivalent per week   Drug use: No    Comment: remote majijuana     Allergies   Progesterone, Codeine, and Cymbalta [duloxetine hcl]   Review of Systems Review of Systems   Physical Exam Triage Vital Signs ED Triage Vitals  Enc Vitals Group     BP 08/06/22 1827 104/72     Pulse Rate 08/06/22 1827 (!) 101     Resp 08/06/22 1827 (!) 22     Temp 08/06/22 1827 (!) 97.4 F (36.3 C)     Temp Source 08/06/22 1827 Oral     SpO2 08/06/22 1827 96 %     Weight --      Height --      Head Circumference --      Peak Flow --      Pain Score 08/06/22 1830 0     Pain Loc --      Pain Edu? --      Excl. in GC? --    No data found.  Updated Vital Signs BP 113/78 (BP Location: Left Arm)   Pulse 82   Temp (!) 97.4 F (36.3 C) (Oral)   Resp 16   SpO2 98%   Visual Acuity Right Eye Distance:   Left Eye Distance:   Bilateral  Distance:    Right Eye Near:   Left Eye Near:    Bilateral Near:     Physical Exam Vitals  reviewed.  Constitutional:      General: She is not in acute distress.    Appearance: She is not ill-appearing, toxic-appearing or diaphoretic.  HENT:     Nose: Nose normal.     Mouth/Throat:     Mouth: Mucous membranes are moist.     Pharynx: No oropharyngeal exudate or posterior oropharyngeal erythema.     Comments: Tongue and lips are normal Eyes:     Extraocular Movements: Extraocular movements intact.     Conjunctiva/sclera: Conjunctivae normal.     Pupils: Pupils are equal, round, and reactive to light.  Cardiovascular:     Rate and Rhythm: Normal rate and regular rhythm.     Heart sounds: No murmur heard. Pulmonary:     Effort: Pulmonary effort is normal. No respiratory distress.     Breath sounds: No stridor. No wheezing, rhonchi or rales.  Musculoskeletal:     Cervical back: Neck supple.  Lymphadenopathy:     Cervical: No cervical adenopathy.  Skin:    Coloration: Skin is not jaundiced or pale.     Comments: There are extensive urticaria over her neck, arms and legs, and trunk.  Neurological:     General: No focal deficit present.     Mental Status: She is alert and oriented to person, place, and time.  Psychiatric:        Behavior: Behavior normal.      UC Treatments / Results  Labs (all labs ordered are listed, but only abnormal results are displayed) Labs Reviewed - No data to display  EKG   Radiology No results found.  Procedures Procedures (including critical care time)  Medications Ordered in UC Medications  diphenhydrAMINE (BENADRYL) capsule 25 mg (25 mg Oral Given 08/06/22 1843)  famotidine (PEPCID) tablet 20 mg (20 mg Oral Given 08/06/22 1843)  methylPREDNISolone acetate (DEPO-MEDROL) injection 80 mg (80 mg Intramuscular Given 08/06/22 1846)    Initial Impression / Assessment and Plan / UC Course  I have reviewed the triage vital signs and the nursing  notes.  Pertinent labs & imaging results that were available during my care of the patient were reviewed by me and considered in my medical decision making (see chart for details).        We are giving her oral fluids, 25 mg more of Benadryl, Pepcid 20 mg and injection of the Medrol.  30 minutes after we gave her the above medications, she is no longer itching and the urticaria are subsiding.  Blood pressure on repeat was 113 systolic and heart rate was 83. Final Clinical Impressions(s) / UC Diagnoses   Final diagnoses:  Allergic urticaria     Discharge Instructions      We gave you 80 mg of methylprednisolone and a shot today.  Take prednisone 20 mg--2 daily for 5 days  Take famotidine 20 mg--1 tablet 2 times daily for 5 days; this is to have allergic reaction in a different way.  We gave you 1 dose of this medicine tonight  We also gave you 25 mg more of Benadryl.  Going forward you can take Zyrtec or Claritin or Allegra once daily as needed for the allergies so they do not make you sleepy  Follow-up with your primary care about this issue      ED Prescriptions     Medication Sig Dispense Auth. Provider   predniSONE (DELTASONE) 20 MG tablet Take 2 tablets (40 mg total) by mouth daily with breakfast for 5 days. 10 tablet [address]  K, MD   famotidine (PEPCID) 20 MG tablet Take 1 tablet (20 mg total) by mouth 2 (two) times daily. 10 tablet Marlinda Mike Janace Aris, MD      PDMP not reviewed this encounter.   Zenia Resides, MD 08/06/22 [phone]

## 2022-08-06 NOTE — Discharge Instructions (Addendum)
We gave you 80 mg of methylprednisolone and a shot today.  Take prednisone 20 mg--2 daily for 5 days  Take famotidine 20 mg--1 tablet 2 times daily for 5 days; this is to have allergic reaction in a different way.  We gave you 1 dose of this medicine tonight  We also gave you 25 mg more of Benadryl.  Going forward you can take Zyrtec or Claritin or Allegra once daily as needed for the allergies so they do not make you sleepy  Follow-up with your primary care about this issue

## 2022-08-16 ENCOUNTER — Ambulatory Visit (INDEPENDENT_AMBULATORY_CARE_PROVIDER_SITE_OTHER): Payer: BC Managed Care – PPO | Admitting: Psychology

## 2022-08-16 DIAGNOSIS — F331 Major depressive disorder, recurrent, moderate: Secondary | ICD-10-CM

## 2022-08-16 DIAGNOSIS — F411 Generalized anxiety disorder: Secondary | ICD-10-CM

## 2022-08-16 NOTE — Progress Notes (Signed)
[facility] Counselor/Therapist Progress Note  Patient ID: Jaime Chapman, MRN: [id number],    Date: 08/16/2022  Time Spent: 9:01am-9:49am     Treatment Type: Individual Therapy  pt is seen for a virtual video visit via caregility.  Pt joins from her car, reporting privacy, and counselor from her home office.    Reported Symptoms: pt reports some increased anxiety and depressed moods.    Mental Status Exam: Appearance:  Well Groomed     Behavior: Appropriate  Motor: Normal  Speech/Language:  Clear and Coherent  Affect: Congruent  Mood: anxious and depressed  Thought process: normal  Thought content:   WNL  Sensory/Perceptual disturbances:   WNL  Orientation: oriented to person, place, time/date, and situation  Attention: Good  Concentration: Good  Memory: WNL  Fund of knowledge:  Good  Insight:   Good  Judgment:  Good  Impulse Control: Good   Risk Assessment: Danger to Self:  No Self-injurious Behavior: No Danger to Others: No Duty to Warn:no Physical Aggression / Violence:No  Access to Firearms a concern: No  Gang Involvement:No   Subjective: Counselor assessed pt current functioning per pt report. Processed w/pt positives, stressors and mood.  Explored w/pt potential contributing factors w/ increased anxiety/depression.  Discussed ways to plan for self care through her recovery from surgery.  Pt affect wnl.  Pt reported she enjoyed her beach trip.  Pt reported that has knee replacement surgery tomorrow and feeling overwhelmed trying to get everything done prior to.  Pt recognized not realistic to get complete all her list.  Pt recognized some increased anxiety and some depressed mood creeping back in. Pt discussed contributing factors w/ worry re: surgery, recovery, how household will function during this time and not able to get away when needs to.  Pt also discussed thoughts re: relationship and unanswered questions.  Pt recognized ways to seek self care  for self and time/space for self at home when not able to drive.    Interventions: Cognitive Behavioral Therapy and ACT  Diagnosis:Moderate episode of recurrent major depressive disorder (HCC)  Generalized anxiety disorder  Plan: Pt to f/u w/ weekly to biweekly counseling.  Pt to f/u w/ referral to psychiatrist.    Individualized Treatment Plan Strengths: seeking counseling  Supports: husband    Goal/Needs for Treatment:  In order of importance to patient 1) cope w/ depression and anxiety 2) --- 3) ---    Client Statement of Needs: "Seeking safe space to talk through things. Find out why I do the things I do.  I self sabotage myself a lot and want to change."    Treatment Level:outpatient counseling  Symptoms:depressed mood, low motivation/loss of interest, fatigue, negative self worth  Client Treatment Preferences:weekly counseling.  F/u w/ a psychiatrist.    Healthcare consumer's goal for treatment:   Counselor, [name], [facility] will support the patient's ability to achieve the goals identified. Cognitive Behavioral Therapy, Assertive Communication/Conflict Resolution Training, Relaxation Training, ACT, Humanistic and other evidenced-based practices will be used to promote progress towards healthy functioning.    Healthcare consumer will: Actively participate in therapy, working towards healthy functioning.     *Justification for Continuation/Discontinuation of Goal: R=Revised, O=Ongoing, A=Achieved, D=Discontinued   Goal 1) Increase awareness of thoughts and behaviors that impacting depression and anxiety and increase healthier thought patterns, increase positive self care and effective coping skills. Baseline date 07/19/22: Progress towards goal 0; How Often - Daily Target Date Goal Was reviewed Status Code Progress towards goal/Likert rating  07/19/23                            This plan has been reviewed and created by the following participants:  This plan will be  reviewed at least every 12 months. Date Behavioral Health Clinician Date Guardian/Patient   07/19/22  [facility] [name] [facility] 07/19/22 Verbal Consent Provided                                  [name] [facility]

## 2022-08-17 DIAGNOSIS — M1711 Unilateral primary osteoarthritis, right knee: Secondary | ICD-10-CM | POA: Diagnosis not present

## 2022-08-30 ENCOUNTER — Ambulatory Visit (INDEPENDENT_AMBULATORY_CARE_PROVIDER_SITE_OTHER): Payer: BC Managed Care – PPO | Admitting: Psychology

## 2022-08-30 DIAGNOSIS — F411 Generalized anxiety disorder: Secondary | ICD-10-CM | POA: Diagnosis not present

## 2022-08-30 DIAGNOSIS — F331 Major depressive disorder, recurrent, moderate: Secondary | ICD-10-CM | POA: Diagnosis not present

## 2022-08-30 NOTE — Progress Notes (Signed)
[facility] Counselor/Therapist Progress Note  Patient ID: Jaime Chapman, MRN: [id number],    Date: 08/30/2022  Time Spent: 9:02am-9:47am     Treatment Type: Individual Therapy  pt is seen for a virtual video visit via caregility.  Pt consents to telehealth session and is aware of limitations of telehealth visits. Pt joins from her home, reporting privacy, and counselor from her home office.    Reported Symptoms: pt reports pain w/ recent knee surgery, focused on recovery and less depressed and anxious moods    Mental Status Exam: Appearance:  Well Groomed     Behavior: Appropriate  Motor: Normal  Speech/Language:  Clear and Coherent  Affect: Appropriate and Congruent  Mood: normal  Thought process: normal  Thought content:   WNL  Sensory/Perceptual disturbances:   WNL  Orientation: oriented to person, place, time/date, and situation  Attention: Good  Concentration: Good  Memory: WNL  Fund of knowledge:  Good  Insight:   Good  Judgment:  Good  Impulse Control: Good   Risk Assessment: Danger to Self:  No Self-injurious Behavior: No Danger to Others: No Duty to Warn:no Physical Aggression / Violence:No  Access to Firearms a concern: No  Gang Involvement:No   Subjective: Counselor assessed pt current functioning per pt report. Processed w/pt recent surgery and recovery.   Explored w/pt self care and focus on her recovery and realistic expectations.   Discussed supports and what to engage w/ when bored.  Pt affect wnl.  Pt reported she is dealing w/ significant pain from knee replacement.  Pt reports that impacting her sleep.  Pt reports she is active w/ her physical therapy and had worried she was behind w/ her recovery and physical therapist assisted in reframing for pt. Pt reported that her grand kids have been a big support.  Pt reports that she is staying positive about her recovery and feels that having recovery for foscus on has been beneficial to her mood.   Pt reports no anxiety and reduced depressed moods.  Pt reports some boredom and exploring thing sto engage w/.     Interventions: Cognitive Behavioral Therapy and ACT  Diagnosis:Moderate episode of recurrent major depressive disorder (HCC)  Generalized anxiety disorder  Plan: Pt to f/u w/ weekly to biweekly counseling.  Pt to f/u w/ referral to psychiatrist.    Individualized Treatment Plan Strengths: seeking counseling  Supports: husband    Goal/Needs for Treatment:  In order of importance to patient 1) cope w/ depression and anxiety 2) --- 3) ---    Client Statement of Needs: "Seeking safe space to talk through things. Find out why I do the things I do.  I self sabotage myself a lot and want to change."    Treatment Level:outpatient counseling  Symptoms:depressed mood, low motivation/loss of interest, fatigue, negative self worth  Client Treatment Preferences:weekly counseling.  F/u w/ a psychiatrist.    Healthcare consumer's goal for treatment:   Counselor, [name], [facility] will support the patient's ability to achieve the goals identified. Cognitive Behavioral Therapy, Assertive Communication/Conflict Resolution Training, Relaxation Training, ACT, Humanistic and other evidenced-based practices will be used to promote progress towards healthy functioning.    Healthcare consumer will: Actively participate in therapy, working towards healthy functioning.     *Justification for Continuation/Discontinuation of Goal: R=Revised, O=Ongoing, A=Achieved, D=Discontinued   Goal 1) Increase awareness of thoughts and behaviors that impacting depression and anxiety and increase healthier thought patterns, increase positive self care and effective coping skills. Baseline date  07/19/22: Progress towards goal 0; How Often - Daily Target Date Goal Was reviewed Status Code Progress towards goal/Likert rating  07/19/23                            This plan has been reviewed and created by  the following participants:  This plan will be reviewed at least every 12 months. Date Behavioral Health Clinician Date Guardian/Patient   07/19/22  [facility] [name] [facility] 07/19/22 Verbal Consent Provided                                  [name] [facility]

## 2022-09-06 ENCOUNTER — Telehealth: Payer: Self-pay

## 2022-09-06 NOTE — Telephone Encounter (Signed)
Chart review completed for patient. Patient is due for screening mammogram. Mychart message sent to patient to inquire about scheduling mammogram.  Jaime Chapman, Population Health Specialist.  

## 2022-09-08 DIAGNOSIS — E039 Hypothyroidism, unspecified: Secondary | ICD-10-CM | POA: Diagnosis not present

## 2022-09-08 DIAGNOSIS — R5383 Other fatigue: Secondary | ICD-10-CM | POA: Diagnosis not present

## 2022-09-13 ENCOUNTER — Ambulatory Visit: Payer: BC Managed Care – PPO | Admitting: Psychology

## 2022-09-13 DIAGNOSIS — F331 Major depressive disorder, recurrent, moderate: Secondary | ICD-10-CM

## 2022-09-13 DIAGNOSIS — F411 Generalized anxiety disorder: Secondary | ICD-10-CM | POA: Diagnosis not present

## 2022-09-13 NOTE — Progress Notes (Signed)
[facility] Counselor/Therapist Progress Note  Patient ID: Jaime Chapman, MRN: [id number],    Date: 09/13/2022  Time Spent: 8:00am-9:49am     Treatment Type: Individual Therapy  pt is seen for a virtual video visit via caregility.  Pt consents to telehealth session and is aware of limitations of telehealth visits. Pt joins from her home, reporting privacy, and counselor from her home office.    Reported Symptoms: pt reports feeling restless w/ being at home, pt reports some increased pain past 2 days w/ recent knee surgery, Pt reports set boundary in relationship.  Mental Status Exam: Appearance:  Well Groomed     Behavior: Appropriate  Motor: Normal  Speech/Language:  Clear and Coherent  Affect: Appropriate  Mood: anxious  Thought process: normal  Thought content:   WNL  Sensory/Perceptual disturbances:   WNL  Orientation: oriented to person, place, time/date, and situation  Attention: Good  Concentration: Good  Memory: WNL  Fund of knowledge:  Good  Insight:   Good  Judgment:  Good  Impulse Control: Good   Risk Assessment: Danger to Self:  No Self-injurious Behavior: No Danger to Others: No Duty to Warn:no Physical Aggression / Violence:No  Access to Firearms a concern: No  Gang Involvement:No   Subjective: Counselor assessed pt current functioning per pt report. Processed w/pt positives and stressors.  Explored recovery and reflected continued progress and ways to meet need for interactions outside of household.  Discussed positive interaction w/ friend and setting boundary of no contact w/ unhealthy relationship.    Pt affect wnl.  Pt reported dealing w/ increased pain yesterday and last night- impacting sleep.  Pt is aware that making good progress but difficult w/ pain and impact on mobility and aware of lack of patience.  Pt reported that she has been having positive outlet of best friend and was good to see neighbor the other day.  Pt reported that  she did set boundary of informing not contact w/ unhealthy relationship as recognized hasn't been good for her mood.    Interventions: Cognitive Behavioral Therapy and ACT  Diagnosis:Moderate episode of recurrent major depressive disorder (HCC)  Generalized anxiety disorder  Plan: Pt to f/u w/ weekly to biweekly counseling.  Pt to f/u w/ referral to psychiatrist.    Individualized Treatment Plan Strengths: seeking counseling  Supports: husband    Goal/Needs for Treatment:  In order of importance to patient 1) cope w/ depression and anxiety 2) --- 3) ---    Client Statement of Needs: "Seeking safe space to talk through things. Find out why I do the things I do.  I self sabotage myself a lot and want to change."    Treatment Level:outpatient counseling  Symptoms:depressed mood, low motivation/loss of interest, fatigue, negative self worth  Client Treatment Preferences:weekly counseling.  F/u w/ a psychiatrist.    Healthcare consumer's goal for treatment:   Counselor, Jaime Chapman, [facility] will support the patient's ability to achieve the goals identified. Cognitive Behavioral Therapy, Assertive Communication/Conflict Resolution Training, Relaxation Training, ACT, Humanistic and other evidenced-based practices will be used to promote progress towards healthy functioning.    Healthcare consumer will: Actively participate in therapy, working towards healthy functioning.     *Justification for Continuation/Discontinuation of Goal: R=Revised, O=Ongoing, A=Achieved, D=Discontinued   Goal 1) Increase awareness of thoughts and behaviors that impacting depression and anxiety and increase healthier thought patterns, increase positive self care and effective coping skills. Baseline date 07/19/22: Progress towards goal 0; How Often - Daily  Target Date Goal Was reviewed Status Code Progress towards goal/Likert rating  07/19/23                            This plan has been reviewed and created  by the following participants:  This plan will be reviewed at least every 12 months. Date Behavioral Health Clinician Date Guardian/Patient   07/19/22  [facility] [name] [facility] 07/19/22 Verbal Consent Provided                                      Jaime Chapman [facility]

## 2022-09-17 DIAGNOSIS — N951 Menopausal and female climacteric states: Secondary | ICD-10-CM | POA: Diagnosis not present

## 2022-09-17 DIAGNOSIS — I10 Essential (primary) hypertension: Secondary | ICD-10-CM | POA: Diagnosis not present

## 2022-09-17 DIAGNOSIS — E039 Hypothyroidism, unspecified: Secondary | ICD-10-CM | POA: Diagnosis not present

## 2022-09-28 DIAGNOSIS — Z79899 Other long term (current) drug therapy: Secondary | ICD-10-CM | POA: Diagnosis not present

## 2022-09-28 DIAGNOSIS — F902 Attention-deficit hyperactivity disorder, combined type: Secondary | ICD-10-CM | POA: Diagnosis not present

## 2022-10-04 ENCOUNTER — Ambulatory Visit (INDEPENDENT_AMBULATORY_CARE_PROVIDER_SITE_OTHER): Payer: BC Managed Care – PPO | Admitting: Psychology

## 2022-10-04 DIAGNOSIS — F411 Generalized anxiety disorder: Secondary | ICD-10-CM

## 2022-10-04 DIAGNOSIS — F330 Major depressive disorder, recurrent, mild: Secondary | ICD-10-CM | POA: Diagnosis not present

## 2022-10-04 NOTE — Progress Notes (Signed)
[facility] Counselor/Therapist Progress Note  Patient ID: Jaime Chapman, MRN: [id number],    Date: 10/04/2022  Time Spent: 8:10am-9:03am     Treatment Type: Individual Therapy  pt is seen for a virtual video visit via caregility.  Pt consents to telehealth session and is aware of limitations of telehealth visits. Pt joins from her car at home, reporting privacy, and counselor from her home office.    Reported Symptoms: pt reports improved mood, decreased depressive symptoms and more hopeful.  Pt reports her pain is managed and she is recovering well from surgery- able to get around.  Mental Status Exam: Appearance:  Well Groomed     Behavior: Appropriate  Motor: Normal  Speech/Language:  Clear and Coherent  Affect: Appropriate  Mood: Some worry  Thought process: normal  Thought content:   WNL  Sensory/Perceptual disturbances:   WNL  Orientation: oriented to person, place, time/date, and situation  Attention: Good  Concentration: Good  Memory: WNL  Fund of knowledge:  Good  Insight:   Good  Judgment:  Good  Impulse Control: Good   Risk Assessment: Danger to Self:  No Self-injurious Behavior: No Danger to Others: No Duty to Warn:no Physical Aggression / Violence:No  Access to Firearms a concern: No  Gang Involvement:No   Subjective: Counselor assessed pt current functioning per pt report. Processed w/pt continued progress and mood.  Explored recovery and how she has progressed and managed through stressors.  Discussed positives of engaging and how to plan for when granddaughter goes back to school.  Explored steps for self w/ projects she wants to accomplish.   Pt affect bright.  Pt reported that her pain is managed, sleeping better and more mobile.  Pt reported that not dealing w/ depressed moods and more engaged with things.  Pt reported that she has started back on her ADHD meds and is planning for some decluttering.  Pt discussed when granddaughter starts  school would be good to sign up for community art class to have structure activity outside of house.  Pt still recognizes anxiety and some worry w/ her husband's health but is able to validate for self and not overwhelmed by.    Interventions: Cognitive Behavioral Therapy and ACT  Diagnosis:Generalized anxiety disorder  Mild episode of recurrent major depressive disorder (HCC)  Plan: Pt to f/u w/ weekly to biweekly counseling.  Pt to f/u w/ referral to psychiatrist.    Individualized Treatment Plan Strengths: seeking counseling  Supports: husband    Goal/Needs for Treatment:  In order of importance to patient 1) cope w/ depression and anxiety 2) --- 3) ---    Client Statement of Needs: "Seeking safe space to talk through things. Find out why I do the things I do.  I self sabotage myself a lot and want to change."    Treatment Level:outpatient counseling  Symptoms:depressed mood, low motivation/loss of interest, fatigue, negative self worth  Client Treatment Preferences:weekly counseling.  F/u w/ a psychiatrist.    Healthcare consumer's goal for treatment:   Counselor, [name], [facility] will support the patient's ability to achieve the goals identified. Cognitive Behavioral Therapy, Assertive Communication/Conflict Resolution Training, Relaxation Training, ACT, Humanistic and other evidenced-based practices will be used to promote progress towards healthy functioning.    Healthcare consumer will: Actively participate in therapy, working towards healthy functioning.     *Justification for Continuation/Discontinuation of Goal: R=Revised, O=Ongoing, A=Achieved, D=Discontinued   Goal 1) Increase awareness of thoughts and behaviors that impacting depression and anxiety  and increase healthier thought patterns, increase positive self care and effective coping skills. Baseline date 07/19/22: Progress towards goal 0; How Often - Daily Target Date Goal Was reviewed Status Code Progress  towards goal/Likert rating  07/19/23                            This plan has been reviewed and created by the following participants:  This plan will be reviewed at least every 12 months. Date Behavioral Health Clinician Date Guardian/Patient   07/19/22  [facility] [name] [facility] 07/19/22 Verbal Consent Provided                                      [name] [facility]

## 2022-10-18 ENCOUNTER — Ambulatory Visit (INDEPENDENT_AMBULATORY_CARE_PROVIDER_SITE_OTHER): Payer: BC Managed Care – PPO | Admitting: Psychology

## 2022-10-18 DIAGNOSIS — F411 Generalized anxiety disorder: Secondary | ICD-10-CM

## 2022-10-18 DIAGNOSIS — F330 Major depressive disorder, recurrent, mild: Secondary | ICD-10-CM | POA: Diagnosis not present

## 2022-10-18 NOTE — Progress Notes (Signed)
[facility] Counselor/Therapist Progress Note  Patient ID: Jaime Chapman, MRN: [id number],    Date: 10/18/2022  Time Spent: 9:01am-9:55am     Treatment Type: Individual Therapy  pt is seen for a virtual video visit via caregility.  Pt consents to telehealth session and is aware of limitations of telehealth visits. Pt joins from her car at home, reporting privacy, and counselor from her home office.    Reported Symptoms: pt reports improved mood, decreased depressive symptoms and more hopeful.  Pt reports struggle w/ initiating projects  Mental Status Exam: Appearance:  Well Groomed     Behavior: Appropriate  Motor: Normal  Speech/Language:  Clear and Coherent  Affect: Appropriate  Mood: Some anxiety  Thought process: normal  Thought content:   WNL  Sensory/Perceptual disturbances:   WNL  Orientation: oriented to person, place, time/date, and situation  Attention: Good  Concentration: Good  Memory: WNL  Fund of knowledge:  Good  Insight:   Good  Judgment:  Good  Impulse Control: Good   Risk Assessment: Danger to Self:  No Self-injurious Behavior: No Danger to Others: No Duty to Warn:no Physical Aggression / Violence:No  Access to Firearms a concern: No  Gang Involvement:No   Subjective: Counselor assessed pt current functioning per pt report. Processed w/pt positives and stressors.  Explored struggles w/ initiating and contributing factors.  Reflected small steps taken and encouraging self to continue instead of seeing as negative.  Explored feel of waiting for the bad to happen or days of home visits. Discussed how positive to get out on those days or have something identified for time prior.   Pt affect bright.  Pt reported that she is continuing to recovery well form knee surgery but not back to her norm.  Pt expressed disappointment w/ not getting more accomplished in past 2 weeks.  Pt was able to identify things she has completed and recognized small  steps as positive.  Pt reported that often feels that waiting around for something to happen w/ husband's health and barrier for her.  Pt reports same on days granddaughter has in home therapy.  Pt discussed positive to get out of house and enjoy things lie did past 2 weekends.  Pt discussed planning for possible pottery class to serve for this.    Interventions: Cognitive Behavioral Therapy and ACT  Diagnosis:Generalized anxiety disorder  Mild episode of recurrent major depressive disorder (HCC)  Plan: Pt to f/u w/ weekly to biweekly counseling.  Pt to f/u w/ referral to psychiatrist.    Individualized Treatment Plan Strengths: seeking counseling  Supports: husband    Goal/Needs for Treatment:  In order of importance to patient 1) cope w/ depression and anxiety 2) --- 3) ---    Client Statement of Needs: "Seeking safe space to talk through things. Find out why I do the things I do.  I self sabotage myself a lot and want to change."    Treatment Level:outpatient counseling  Symptoms:depressed mood, low motivation/loss of interest, fatigue, negative self worth  Client Treatment Preferences:weekly counseling.  F/u w/ a psychiatrist.    Healthcare consumer's goal for treatment:   Counselor, [name], [facility] will support the patient's ability to achieve the goals identified. Cognitive Behavioral Therapy, Assertive Communication/Conflict Resolution Training, Relaxation Training, ACT, Humanistic and other evidenced-based practices will be used to promote progress towards healthy functioning.    Healthcare consumer will: Actively participate in therapy, working towards healthy functioning.     *Justification for Continuation/Discontinuation of Goal:  R=Revised, O=Ongoing, A=Achieved, D=Discontinued   Goal 1) Increase awareness of thoughts and behaviors that impacting depression and anxiety and increase healthier thought patterns, increase positive self care and effective coping  skills. Baseline date 07/19/22: Progress towards goal 0; How Often - Daily Target Date Goal Was reviewed Status Code Progress towards goal/Likert rating  07/19/23                            This plan has been reviewed and created by the following participants:  This plan will be reviewed at least every 12 months. Date Behavioral Health Clinician Date Guardian/Patient   07/19/22  [facility] [name] [facility] 07/19/22 Verbal Consent Provided                                   [name] [facility]

## 2022-11-01 ENCOUNTER — Other Ambulatory Visit: Payer: Self-pay | Admitting: Family Medicine

## 2022-11-02 ENCOUNTER — Ambulatory Visit: Payer: BC Managed Care – PPO | Admitting: Psychology

## 2022-11-02 DIAGNOSIS — F330 Major depressive disorder, recurrent, mild: Secondary | ICD-10-CM | POA: Diagnosis not present

## 2022-11-02 DIAGNOSIS — F411 Generalized anxiety disorder: Secondary | ICD-10-CM

## 2022-11-02 NOTE — Telephone Encounter (Signed)
Patient has been scheduled

## 2022-11-02 NOTE — Telephone Encounter (Signed)
Please schedule CPE with fasting labs prior with Dr. Ermalene Searing after 12/16/22.

## 2022-11-02 NOTE — Telephone Encounter (Signed)
LVM for patient to c/b and schedule.  

## 2022-11-02 NOTE — Progress Notes (Signed)
[facility] Counselor/Therapist Progress Note  Patient ID: Jaime Chapman, MRN: [id number],    Date: 11/02/2022  Time Spent: 9:01am-9:54am     Treatment Type: Individual Therapy  pt is seen for a virtual video visit via caregility.  Pt consents to telehealth session and is aware of limitations of telehealth visits. Pt joins from her car at home, reporting privacy, and counselor from her home office.    Reported Symptoms: pt reports feeling tired and increased pain.  Pt reports did get some of her projects started.    Mental Status Exam: Appearance:  Well Groomed     Behavior: Appropriate  Motor: Normal  Speech/Language:  Clear and Coherent  Affect: Appropriate  Mood: depressed  Thought process: normal  Thought content:   WNL  Sensory/Perceptual disturbances:   WNL  Orientation: oriented to person, place, time/date, and situation  Attention: Good  Concentration: Good  Memory: WNL  Fund of knowledge:  Good  Insight:   Good  Judgment:  Good  Impulse Control: Good   Risk Assessment: Danger to Self:  No Self-injurious Behavior: No Danger to Others: No Duty to Warn:no Physical Aggression / Violence:No  Access to Firearms a concern: No  Gang Involvement:No   Subjective: Counselor assessed pt current functioning per pt report. Processed w/pt mood and interactions.  Explored transitions w/ routines recent.  Reflected positives small steps taken and reiterating encouraging self daily.  Explored ways to continue having time for self and away from house.  Pt affect wnl.  Pt reported that she is dealing w/ more fatigue, pain w/ her knee and w/ her joints overall.  Pt wondering if flare of her medical issues.  Pt discussed some positives w/ starting on projects and feeling good that getting some things done.  Pt reported that she is feeling some discouraged by continued pain w/ her knee.  Pt reported that she completed physical therapy and school routine for granddaughter  started back.  Pt discussed need to still have time for self and ways ot engage w/ things out of the house.  Pt considered possible ways and to be intentional aobut.  Pt reports she still waiting to hear about pottery class schedule.      Interventions: Cognitive Behavioral Therapy and ACT  Diagnosis:Generalized anxiety disorder  Mild episode of recurrent major depressive disorder (HCC)  Plan: Pt to f/u w/ weekly to biweekly counseling.  Pt to f/u w/ referral to psychiatrist.    Individualized Treatment Plan Strengths: seeking counseling  Supports: husband    Goal/Needs for Treatment:  In order of importance to patient 1) cope w/ depression and anxiety 2) --- 3) ---    Client Statement of Needs: "Seeking safe space to talk through things. Find out why I do the things I do.  I self sabotage myself a lot and want to change."    Treatment Level:outpatient counseling  Symptoms:depressed mood, low motivation/loss of interest, fatigue, negative self worth  Client Treatment Preferences:weekly counseling.  F/u w/ a psychiatrist.    Healthcare consumer's goal for treatment:   Counselor, [name], [facility] will support the patient's ability to achieve the goals identified. Cognitive Behavioral Therapy, Assertive Communication/Conflict Resolution Training, Relaxation Training, ACT, Humanistic and other evidenced-based practices will be used to promote progress towards healthy functioning.    Healthcare consumer will: Actively participate in therapy, working towards healthy functioning.     *Justification for Continuation/Discontinuation of Goal: R=Revised, O=Ongoing, A=Achieved, D=Discontinued   Goal 1) Increase awareness of thoughts and  behaviors that impacting depression and anxiety and increase healthier thought patterns, increase positive self care and effective coping skills. Baseline date 07/19/22: Progress towards goal 0; How Often - Daily Target Date Goal Was reviewed Status Code  Progress towards goal/Likert rating  07/19/23                            This plan has been reviewed and created by the following participants:  This plan will be reviewed at least every 12 months. Date Behavioral Health Clinician Date Guardian/Patient   07/19/22  [facility] [name] [facility] 07/19/22 Verbal Consent Provided                                      [name] [facility]

## 2022-11-15 ENCOUNTER — Ambulatory Visit (INDEPENDENT_AMBULATORY_CARE_PROVIDER_SITE_OTHER): Payer: BC Managed Care – PPO | Admitting: Psychology

## 2022-11-15 DIAGNOSIS — F411 Generalized anxiety disorder: Secondary | ICD-10-CM

## 2022-11-15 DIAGNOSIS — F330 Major depressive disorder, recurrent, mild: Secondary | ICD-10-CM

## 2022-11-15 NOTE — Progress Notes (Signed)
[facility] Counselor/Therapist Progress Note  Patient ID: Jaime Chapman, MRN: [id number],    Date: 11/15/2022  Time Spent: 9:01am-9:57am     Treatment Type: Individual Therapy  pt is seen for a virtual video visit via caregility.  Pt consents to telehealth session and is aware of limitations of telehealth visits. Pt joins from her car at home, reporting privacy, and counselor from her home office.    Reported Symptoms: pt reports feeling tired and some boredom  Mental Status Exam: Appearance:  Well Groomed     Behavior: Appropriate  Motor: Normal  Speech/Language:  Clear and Coherent  Affect: Appropriate  Mood: Slightly down  Thought process: normal  Thought content:   WNL  Sensory/Perceptual disturbances:   WNL  Orientation: oriented to person, place, time/date, and situation  Attention: Good  Concentration: Good  Memory: WNL  Fund of knowledge:  Good  Insight:   Good  Judgment:  Good  Impulse Control: Good   Risk Assessment: Danger to Self:  No Self-injurious Behavior: No Danger to Others: No Duty to Warn:no Physical Aggression / Violence:No  Access to Firearms a concern: No  Gang Involvement:No   Subjective: Counselor assessed pt current functioning per pt report. Processed w/pt mood and interactions.  Explored engagement in activities and further options.  Discussed positives and challenges w/ interactions at home.  Pt affect wnl.  Pt reported that she is struggling w/ recent fatigue.  Pt reports that at times bored.  Pt reports no longer feeling overwhelmed and notes that things have slowed down.  Pt discussed positives w/ her grandson and granddaughter and feels good about.  Pt reports some challenges w/ interactions w/ husband and feeling annoyed w/.  Pt recognized need to get out of house over weekend and engage w/ things.  Pt reports looking into other options for things to engage with.   Interventions: Cognitive Behavioral Therapy and  ACT  Diagnosis:Generalized anxiety disorder  Mild episode of recurrent major depressive disorder (HCC)  Plan: Pt to f/u w/ weekly to biweekly counseling.  Pt to f/u w/ referral to psychiatrist.    Individualized Treatment Plan Strengths: seeking counseling  Supports: husband    Goal/Needs for Treatment:  In order of importance to patient 1) cope w/ depression and anxiety 2) --- 3) ---    Client Statement of Needs: "Seeking safe space to talk through things. Find out why I do the things I do.  I self sabotage myself a lot and want to change."    Treatment Level:outpatient counseling  Symptoms:depressed mood, low motivation/loss of interest, fatigue, negative self worth  Client Treatment Preferences:weekly counseling.  F/u w/ a psychiatrist.    Healthcare consumer's goal for treatment:   Counselor, Jaime Chapman, [facility] will support the patient's ability to achieve the goals identified. Cognitive Behavioral Therapy, Assertive Communication/Conflict Resolution Training, Relaxation Training, ACT, Humanistic and other evidenced-based practices will be used to promote progress towards healthy functioning.    Healthcare consumer will: Actively participate in therapy, working towards healthy functioning.     *Justification for Continuation/Discontinuation of Goal: R=Revised, O=Ongoing, A=Achieved, D=Discontinued   Goal 1) Increase awareness of thoughts and behaviors that impacting depression and anxiety and increase healthier thought patterns, increase positive self care and effective coping skills. Baseline date 07/19/22: Progress towards goal 0; How Often - Daily Target Date Goal Was reviewed Status Code Progress towards goal/Likert rating  07/19/23  This plan has been reviewed and created by the following participants:  This plan will be reviewed at least every 12 months. Date Behavioral Health Clinician Date Guardian/Patient   07/19/22  [facility] Jaime Chapman  [facility] 07/19/22 Verbal Consent Provided                                    Jaime Chapman [facility]

## 2022-11-29 ENCOUNTER — Ambulatory Visit (INDEPENDENT_AMBULATORY_CARE_PROVIDER_SITE_OTHER): Payer: BC Managed Care – PPO | Admitting: Psychology

## 2022-11-29 DIAGNOSIS — F411 Generalized anxiety disorder: Secondary | ICD-10-CM | POA: Diagnosis not present

## 2022-11-29 DIAGNOSIS — F330 Major depressive disorder, recurrent, mild: Secondary | ICD-10-CM | POA: Diagnosis not present
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
## 2022-12-13 ENCOUNTER — Telehealth: Payer: Self-pay | Admitting: *Deleted

## 2022-12-13 ENCOUNTER — Ambulatory Visit (INDEPENDENT_AMBULATORY_CARE_PROVIDER_SITE_OTHER): Payer: BC Managed Care – PPO | Admitting: Psychology

## 2022-12-13 DIAGNOSIS — F411 Generalized anxiety disorder: Secondary | ICD-10-CM | POA: Diagnosis not present

## 2022-12-13 DIAGNOSIS — E782 Mixed hyperlipidemia: Secondary | ICD-10-CM

## 2022-12-13 DIAGNOSIS — F330 Major depressive disorder, recurrent, mild: Secondary | ICD-10-CM

## 2022-12-13 DIAGNOSIS — R7303 Prediabetes: Secondary | ICD-10-CM

## 2022-12-13 DIAGNOSIS — E039 Hypothyroidism, unspecified: Secondary | ICD-10-CM

## 2022-12-13 DIAGNOSIS — E559 Vitamin D deficiency, unspecified: Secondary | ICD-10-CM

## 2022-12-13 NOTE — Progress Notes (Signed)
[facility] Counselor/Therapist Progress Note  Patient ID: Jaime Chapman, MRN: [id number],    Date: 12/13/2022  Time Spent: 9:00am-10:02am     Treatment Type: Individual Therapy  pt is seen for a virtual video visit via caregility.  Pt consents to telehealth session and is aware of limitations of telehealth visits. Pt joins from her car at home, reporting privacy, and counselor from her home office.    Reported Symptoms: pt reports feeling exhausted, low motivation.  Mental Status Exam: Appearance:  Well Groomed     Behavior: Appropriate  Motor: Normal  Speech/Language:  Clear and Coherent  Affect: Appropriate  Mood: Down- low energy  Thought process: normal  Thought content:   WNL  Sensory/Perceptual disturbances:   WNL  Orientation: oriented to person, place, time/date, and situation  Attention: Good  Concentration: Good  Memory: WNL  Fund of knowledge:  Good  Insight:   Good  Judgment:  Good  Impulse Control: Good   Risk Assessment: Danger to Self:  No Self-injurious Behavior: No Danger to Others: No Duty to Warn:no Physical Aggression / Violence:No  Access to Firearms a concern: No  Gang Involvement:No   Subjective: Counselor assessed pt current functioning per pt report. Processed w/pt low energy and mood.  Explored w/ potential contributing factors and self care.  Discussed small steps and acknowledging positive steps taking.   Pt affect congruent w/ report of feeling low energy.  Pt reports that can't make self do anything and did sleep more yesterday.  Pt discussed potential of health impacting and flare up of disease.  Pt reported is following up w/ PCP about.  Pt discussed stressors and acknowledging their impact. Pt was able to reflect on thins she is doing and steps that are positive. Pt identified self care to focus on and not putting expectations too high.    Interventions: Cognitive Behavioral Therapy and supportive  Diagnosis:Generalized  anxiety disorder  Mild episode of recurrent major depressive disorder (HCC)  Plan: Pt to f/u w/ weekly to biweekly counseling.  Pt to f/u w/ referral to psychiatrist.    Individualized Treatment Plan Strengths: seeking counseling  Supports: husband    Goal/Needs for Treatment:  In order of importance to patient 1) cope w/ depression and anxiety 2) --- 3) ---    Client Statement of Needs: "Seeking safe space to talk through things. Find out why I do the things I do.  I self sabotage myself a lot and want to change."    Treatment Level:outpatient counseling  Symptoms:depressed mood, low motivation/loss of interest, fatigue, negative self worth  Client Treatment Preferences:weekly counseling.  F/u w/ a psychiatrist.    Healthcare consumer's goal for treatment:   Counselor, [name], [facility] will support the patient's ability to achieve the goals identified. Cognitive Behavioral Therapy, Assertive Communication/Conflict Resolution Training, Relaxation Training, ACT, Humanistic and other evidenced-based practices will be used to promote progress towards healthy functioning.    Healthcare consumer will: Actively participate in therapy, working towards healthy functioning.     *Justification for Continuation/Discontinuation of Goal: R=Revised, O=Ongoing, A=Achieved, D=Discontinued   Goal 1) Increase awareness of thoughts and behaviors that impacting depression and anxiety and increase healthier thought patterns, increase positive self care and effective coping skills. Baseline date 07/19/22: Progress towards goal 0; How Often - Daily Target Date Goal Was reviewed Status Code Progress towards goal/Likert rating  07/19/23  This plan has been reviewed and created by the following participants:  This plan will be reviewed at least every 12 months. Date Behavioral Health Clinician Date Guardian/Patient   07/19/22  [facility] [name] [name] 07/19/22 Verbal Consent  Provided                                     [name] [facility]

## 2022-12-13 NOTE — Telephone Encounter (Signed)
-----   Message from Vincenza Hews sent at 12/01/2022 10:39 AM EDT ----- Regarding: Labs Wed. 12/15/22 Hello,  Patient is coming in for CPE labs on Wednesday 12/15/22. Can we get orders please.   Thanks

## 2022-12-15 ENCOUNTER — Other Ambulatory Visit (INDEPENDENT_AMBULATORY_CARE_PROVIDER_SITE_OTHER): Payer: BC Managed Care – PPO

## 2022-12-15 DIAGNOSIS — E559 Vitamin D deficiency, unspecified: Secondary | ICD-10-CM

## 2022-12-15 DIAGNOSIS — R7303 Prediabetes: Secondary | ICD-10-CM | POA: Diagnosis not present

## 2022-12-15 DIAGNOSIS — E039 Hypothyroidism, unspecified: Secondary | ICD-10-CM

## 2022-12-15 DIAGNOSIS — E782 Mixed hyperlipidemia: Secondary | ICD-10-CM

## 2022-12-15 LAB — LIPID PANEL
Cholesterol: 241 mg/dL — ABNORMAL HIGH (ref 0–200)
HDL: 63.5 mg/dL
LDL Cholesterol: 144 mg/dL — ABNORMAL HIGH (ref 0–99)
NonHDL: 177.54
Total CHOL/HDL Ratio: 4
Triglycerides: 170 mg/dL — ABNORMAL HIGH (ref 0.0–149.0)
VLDL: 34 mg/dL (ref 0.0–40.0)

## 2022-12-15 LAB — COMPREHENSIVE METABOLIC PANEL
ALT: 11 U/L (ref 0–35)
AST: 13 U/L (ref 0–37)
Albumin: 4.5 g/dL (ref 3.5–5.2)
Alkaline Phosphatase: 51 U/L (ref 39–117)
BUN: 18 mg/dL (ref 6–23)
CO2: 30 meq/L (ref 19–32)
Calcium: 9.9 mg/dL (ref 8.4–10.5)
Chloride: 102 meq/L (ref 96–112)
Creatinine, Ser: 0.92 mg/dL (ref 0.40–1.20)
GFR: 67.19 mL/min (ref >60.00)
Glucose, Bld: 105 mg/dL — ABNORMAL HIGH (ref 70–99)
Potassium: 3.5 meq/L (ref 3.5–5.1)
Sodium: 141 meq/L (ref 135–145)
Total Bilirubin: 0.4 mg/dL (ref 0.2–1.2)
Total Protein: 7.2 g/dL (ref 6.0–8.3)

## 2022-12-15 LAB — HEMOGLOBIN A1C: Hgb A1c MFr Bld: 5.8 % (ref 4.6–6.5)

## 2022-12-15 LAB — T3, FREE: T3, Free: 3.2 pg/mL (ref 2.3–4.2)

## 2022-12-15 LAB — TSH: TSH: 3.86 u[IU]/mL (ref 0.35–5.50)

## 2022-12-15 LAB — T4, FREE: Free T4: 0.56 ng/dL — ABNORMAL LOW (ref 0.60–1.60)

## 2022-12-15 LAB — VITAMIN D 25 HYDROXY (VIT D DEFICIENCY, FRACTURES): VITD: 19.62 ng/mL — ABNORMAL LOW (ref 30.00–100.00)

## 2022-12-16 NOTE — Progress Notes (Signed)
No critical labs need to be addressed urgently. We will discuss labs in detail at upcoming office visit.   

## 2022-12-22 ENCOUNTER — Encounter: Payer: Self-pay | Admitting: Family Medicine

## 2022-12-22 ENCOUNTER — Ambulatory Visit: Payer: BC Managed Care – PPO | Admitting: Family Medicine

## 2022-12-22 VITALS — BP 122/82 | HR 75 | Temp 98.6°F | Ht 65.0 in | Wt 189.6 lb

## 2022-12-22 DIAGNOSIS — Z23 Encounter for immunization: Secondary | ICD-10-CM | POA: Diagnosis not present

## 2022-12-22 DIAGNOSIS — E039 Hypothyroidism, unspecified: Secondary | ICD-10-CM | POA: Diagnosis not present

## 2022-12-22 DIAGNOSIS — J4540 Moderate persistent asthma, uncomplicated: Secondary | ICD-10-CM

## 2022-12-22 DIAGNOSIS — H9193 Unspecified hearing loss, bilateral: Secondary | ICD-10-CM

## 2022-12-22 DIAGNOSIS — E559 Vitamin D deficiency, unspecified: Secondary | ICD-10-CM

## 2022-12-22 DIAGNOSIS — Z1211 Encounter for screening for malignant neoplasm of colon: Secondary | ICD-10-CM

## 2022-12-22 DIAGNOSIS — Z0000 Encounter for general adult medical examination without abnormal findings: Secondary | ICD-10-CM

## 2022-12-22 DIAGNOSIS — F331 Major depressive disorder, recurrent, moderate: Secondary | ICD-10-CM

## 2022-12-22 DIAGNOSIS — E66811 Obesity, class 1: Secondary | ICD-10-CM

## 2022-12-22 DIAGNOSIS — R7303 Prediabetes: Secondary | ICD-10-CM

## 2022-12-22 DIAGNOSIS — Z9884 Bariatric surgery status: Secondary | ICD-10-CM | POA: Insufficient documentation

## 2022-12-22 DIAGNOSIS — E661 Drug-induced obesity: Secondary | ICD-10-CM

## 2022-12-22 DIAGNOSIS — E782 Mixed hyperlipidemia: Secondary | ICD-10-CM | POA: Diagnosis not present

## 2022-12-22 DIAGNOSIS — Z6831 Body mass index (BMI) 31.0-31.9, adult: Secondary | ICD-10-CM

## 2022-12-22 DIAGNOSIS — I10 Essential (primary) hypertension: Secondary | ICD-10-CM | POA: Diagnosis not present

## 2022-12-22 DIAGNOSIS — Z8669 Personal history of other diseases of the nervous system and sense organs: Secondary | ICD-10-CM

## 2022-12-22 MED ORDER — VITAMIN D3 1.25 MG (50000 UT) PO CAPS: 1.0000 | ORAL_CAPSULE | ORAL | 0 refills | Status: DC

## 2022-12-22 MED ORDER — LOSARTAN POTASSIUM-HCTZ 50-12.5 MG PO TABS: 1.0000 | ORAL_TABLET | Freq: Every day | ORAL | 3 refills | Status: DC

## 2022-12-22 NOTE — Assessment & Plan Note (Signed)
Encouraged exercise, weight loss, healthy eating habits. ? ?
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
## 2022-12-22 NOTE — Assessment & Plan Note (Signed)
Chronic, working on [employer] and increase activity.

## 2022-12-22 NOTE — Assessment & Plan Note (Signed)
Chronic, currently low not taking any supplement.  Repeat 12-week course of 50,000 international units weekly.

## 2022-12-22 NOTE — Assessment & Plan Note (Signed)
Mother with early hearing loss.. not sure cause.  Hearing test today and referral to ENT vs audiologist.

## 2022-12-22 NOTE — Assessment & Plan Note (Signed)
Moderate control, increased stress. Seeing counselor, on no medication.

## 2022-12-22 NOTE — Assessment & Plan Note (Signed)
Stable, chronic.  Continue current medication.  Losartan HCTZ 50/12.5 mg daily  HCTZ 12.5 mg daily.

## 2022-12-22 NOTE — Assessment & Plan Note (Addendum)
Chronic, TSH controlled on NP thyroid  management by alternative med.

## 2022-12-22 NOTE — Assessment & Plan Note (Signed)
Resolved with bariatric surgery.

## 2022-12-22 NOTE — Assessment & Plan Note (Signed)
Chronic, moderate control.  4.710-year ASCVD risk score. Not currently on any medication.

## 2022-12-22 NOTE — Assessment & Plan Note (Addendum)
Chronic, stable control using albuterol as needed.   Followed by pulmonary in past.

## 2022-12-22 NOTE — Progress Notes (Signed)
Hearing Screening  Method: Audiometry   500Hz  2000Hz  4000Hz   Right ear 40    Left ear 40 40 40

## 2022-12-24 NOTE — Addendum Note (Signed)
Addended by: Kerby Nora E on: 12/24/2022 02:02 PM   Modules accepted: Orders

## 2022-12-27 ENCOUNTER — Ambulatory Visit (INDEPENDENT_AMBULATORY_CARE_PROVIDER_SITE_OTHER): Payer: BC Managed Care – PPO | Admitting: Psychology

## 2022-12-27 DIAGNOSIS — F411 Generalized anxiety disorder: Secondary | ICD-10-CM

## 2022-12-27 DIAGNOSIS — F330 Major depressive disorder, recurrent, mild: Secondary | ICD-10-CM | POA: Diagnosis not present

## 2022-12-27 NOTE — Progress Notes (Signed)
[facility] Counselor/Therapist Progress Note  Patient ID: Jaime Chapman, MRN: [id number],    Date: 12/27/2022  Time Spent: 9:02am-9:55am     Treatment Type: Individual Therapy  pt is seen for a virtual video visit via caregility.  Pt consents to telehealth session and is aware of limitations of telehealth visits. Pt joins from her car at home, reporting privacy, and counselor from her home office.    Reported Symptoms: pt reports some engagement w/ activities.  Pt reports assertive w/ husband.  Pt reports struggles w/ feeling low motivation.  Mental Status Exam: Appearance:  Well Groomed     Behavior: Appropriate  Motor: Normal  Speech/Language:  Clear and Coherent  Affect: Appropriate  Mood: Down- low energy  Thought process: normal  Thought content:   WNL  Sensory/Perceptual disturbances:   WNL  Orientation: oriented to person, place, time/date, and situation  Attention: Good  Concentration: Good  Memory: WNL  Fund of knowledge:  Good  Insight:   Good  Judgment:  Good  Impulse Control: Good   Risk Assessment: Danger to Self:  No Self-injurious Behavior: No Danger to Others: No Duty to Warn:no Physical Aggression / Violence:No  Access to Firearms a concern: No  Gang Involvement:No   Subjective: Counselor assessed pt current functioning per pt report. Processed w/pt mood, energy and engagement.  Explored some positives w/ self care and engaging in some activities and also given permission for rest.  Discussed some avoidance at times and ways to seek own space and assert her needs.  Pt affect wnl.  Pt reports that she did enjoy engaging w/ some activities over weekend and then allowing self rest yesterday.  Pt reports struggles still w/ motivation to engage.  Pt reported on options for getting out of house and benefit to her.  Pt reports on stressors at home and asserting boundaries w/ husband recent.  Pt reported that granddaughter obgyn visit showed  uterine mass that needed further dx.  Pt reports that was on list for residential tx beginning of November, but may have to push back now.  Pt identified plan for engaging w/ activities that enjoys/gives her alone time.  Interventions: Cognitive Behavioral Therapy and supportive  Diagnosis:Generalized anxiety disorder  Mild episode of recurrent major depressive disorder (HCC)  Plan: Pt to f/u w/ weekly to biweekly counseling.  Pt to f/u w/ referral to psychiatrist.    Individualized Treatment Plan Strengths: seeking counseling  Supports: husband    Goal/Needs for Treatment:  In order of importance to patient 1) cope w/ depression and anxiety 2) --- 3) ---    Client Statement of Needs: "Seeking safe space to talk through things. Find out why I do the things I do.  I self sabotage myself a lot and want to change."    Treatment Level:outpatient counseling  Symptoms:depressed mood, low motivation/loss of interest, fatigue, negative self worth  Client Treatment Preferences:weekly counseling.  F/u w/ a psychiatrist.    Healthcare consumer's goal for treatment:   Counselor, [name], [facility] will support the patient's ability to achieve the goals identified. Cognitive Behavioral Therapy, Assertive Communication/Conflict Resolution Training, Relaxation Training, ACT, Humanistic and other evidenced-based practices will be used to promote progress towards healthy functioning.    Healthcare consumer will: Actively participate in therapy, working towards healthy functioning.     *Justification for Continuation/Discontinuation of Goal: R=Revised, O=Ongoing, A=Achieved, D=Discontinued   Goal 1) Increase awareness of thoughts and behaviors that impacting depression and anxiety and increase healthier thought patterns,  increase positive self care and effective coping skills. Baseline date 07/19/22: Progress towards goal 0; How Often - Daily Target Date Goal Was reviewed Status Code Progress  towards goal/Likert rating  07/19/23                            This plan has been reviewed and created by the following participants:  This plan will be reviewed at least every 12 months. Date Behavioral Health Clinician Date Guardian/Patient   07/19/22  [facility] [name] [facility] 07/19/22 Verbal Consent Provided                                     [name] [facility]

## 2022-12-28 DIAGNOSIS — Z903 Acquired absence of stomach [part of]: Secondary | ICD-10-CM | POA: Diagnosis not present

## 2022-12-28 DIAGNOSIS — K912 Postsurgical malabsorption, not elsewhere classified: Secondary | ICD-10-CM | POA: Diagnosis not present

## 2022-12-29 DIAGNOSIS — M85852 Other specified disorders of bone density and structure, left thigh: Secondary | ICD-10-CM | POA: Diagnosis not present

## 2022-12-29 DIAGNOSIS — M8589 Other specified disorders of bone density and structure, multiple sites: Secondary | ICD-10-CM | POA: Diagnosis not present

## 2022-12-29 DIAGNOSIS — Z9884 Bariatric surgery status: Secondary | ICD-10-CM | POA: Diagnosis not present

## 2022-12-31 ENCOUNTER — Encounter (INDEPENDENT_AMBULATORY_CARE_PROVIDER_SITE_OTHER): Payer: Self-pay | Admitting: Otolaryngology

## 2023-01-07 ENCOUNTER — Institutional Professional Consult (permissible substitution) (INDEPENDENT_AMBULATORY_CARE_PROVIDER_SITE_OTHER): Payer: BC Managed Care – PPO

## 2023-01-10 ENCOUNTER — Ambulatory Visit: Payer: BC Managed Care – PPO | Admitting: Psychology

## 2023-01-10 DIAGNOSIS — F411 Generalized anxiety disorder: Secondary | ICD-10-CM

## 2023-01-10 DIAGNOSIS — F330 Major depressive disorder, recurrent, mild: Secondary | ICD-10-CM

## 2023-01-10 NOTE — Progress Notes (Signed)
Jaime Chapman  Patient ID: Jaime Chapman, MRN: [id number],    Date: 01/10/2023  Time Spent: 9:00am-9:51am     Treatment Type: Individual Therapy  pt is seen for a virtual video visit via caregility.  Pt consents to telehealth session and is aware of limitations of telehealth visits. Pt joins from her home, reporting privacy, and counselor from her home office.    Reported Symptoms: pt reports in survival mode- feeling tense and worry.    Mental Status Exam: Appearance:  Well Groomed     Behavior: Appropriate  Motor: Normal  Speech/Language:  Clear and Coherent  Affect: Appropriate  Mood: anxious  Thought process: normal  Thought content:   WNL  Sensory/Perceptual disturbances:   WNL  Orientation: oriented to person, place, time/date, and situation  Attention: Good  Concentration: Good  Memory: WNL  Fund of knowledge:  Good  Insight:   Good  Judgment:  Good  Impulse Control: Good   Risk Assessment: Danger to Self:  No Self-injurious Behavior: No Danger to Others: No Duty to Warn:no Physical Aggression / Violence:No  Access to Firearms a concern: No  Gang Involvement:No   Subjective: Counselor assessed pt current functioning per pt report. Processed w/pt stressors and anxiety.  Explored granddaughters medical stressors and impact over past 2 weeks.  Discussed coping w/ self care, moments for releasing tension, and connecting w/ supports..  Pt affect congruent w/ report of stress.  Pt reports past 2 weeks focus on granddaughters health- ovarian tumor identifies and quickly referred to specialist and last Friday to remove tumor the size of fullterm infant.  Pt reports awaiting biopsy results.  Pt feels like in survival mode, attending to things as they come and recognizing knot in stomach and tension in her body.  Pt reports that connecting w/ supports and recognizing ok to accept any support offered.  Pt receptive to ways tune in  and release tension.    Interventions: Cognitive Behavioral Therapy and supportive  Diagnosis:Generalized anxiety disorder  Mild episode of recurrent major depressive disorder (HCC)  Plan: Pt to f/u w/ weekly to biweekly counseling.  Pt to f/u w/ referral to psychiatrist.    Individualized Treatment Plan Strengths: seeking counseling  Supports: husband    Goal/Needs for Treatment:  In order of importance to patient 1) cope w/ depression and anxiety 2) --- 3) ---    Client Statement of Needs: "Seeking safe space to talk through things. Find out why I do the things I do.  I self sabotage myself a lot and want to change."    Treatment Level:outpatient counseling  Symptoms:depressed mood, low motivation/loss of interest, fatigue, negative self worth  Client Treatment Preferences:weekly counseling.  F/u w/ a psychiatrist.    Healthcare consumer's goal for treatment:   Counselor, [name], [facility] will support the patient's ability to achieve the goals identified. Cognitive Behavioral Therapy, Assertive Communication/Conflict Resolution Training, Relaxation Training, ACT, Humanistic and other evidenced-based practices will be used to promote progress towards healthy functioning.    Healthcare consumer will: Actively participate in therapy, working towards healthy functioning.     *Justification for Continuation/Discontinuation of Goal: R=Revised, O=Ongoing, A=Achieved, D=Discontinued   Goal 1) Increase awareness of thoughts and behaviors that impacting depression and anxiety and increase healthier thought patterns, increase positive self care and effective coping skills. Baseline date 07/19/22: Progress towards goal 0; How Often - Daily Target Date Goal Was reviewed Status Code Progress towards goal/Likert rating  07/19/23  This plan has been reviewed and created by the following participants:  This plan will be reviewed at least every 12 months. Date  Behavioral Health Clinician Date Guardian/Patient   07/19/22  [facility] [name] [facility] 07/19/22 Verbal Consent Provided                                     [name] [facility]

## 2023-01-11 DIAGNOSIS — E039 Hypothyroidism, unspecified: Secondary | ICD-10-CM | POA: Diagnosis not present

## 2023-01-11 DIAGNOSIS — K760 Fatty (change of) liver, not elsewhere classified: Secondary | ICD-10-CM | POA: Diagnosis not present

## 2023-01-11 DIAGNOSIS — Z903 Acquired absence of stomach [part of]: Secondary | ICD-10-CM | POA: Diagnosis not present

## 2023-01-11 DIAGNOSIS — K912 Postsurgical malabsorption, not elsewhere classified: Secondary | ICD-10-CM | POA: Diagnosis not present

## 2023-01-11 DIAGNOSIS — I10 Essential (primary) hypertension: Secondary | ICD-10-CM | POA: Diagnosis not present

## 2023-01-17 ENCOUNTER — Ambulatory Visit (INDEPENDENT_AMBULATORY_CARE_PROVIDER_SITE_OTHER): Payer: BC Managed Care – PPO | Admitting: Audiology

## 2023-01-17 ENCOUNTER — Ambulatory Visit (INDEPENDENT_AMBULATORY_CARE_PROVIDER_SITE_OTHER): Payer: BC Managed Care – PPO

## 2023-01-17 DIAGNOSIS — H903 Sensorineural hearing loss, bilateral: Secondary | ICD-10-CM

## 2023-01-17 NOTE — Progress Notes (Unsigned)
Approximately primary care was concernedDear Dr. Ermalene Searing, Here is my assessment for our mutual patient, Jaime Chapman. Thank you for allowing me the opportunity to care for your patient. Please do not hesitate to contact me should you have any other questions. Sincerely, Burna Forts PA-C  Otolaryngology Clinic Note Referring provider: Dr. Ermalene Searing HPI:  Jaime Chapman is a 62 y.o. female kindly referred by Dr. Ermalene Searing for evaluation of hearing loss.   The patient notes that over the last several years she has had bilateral hearing loss which is symmetric.  She notes over the last year it has worsened.  She notes that she has difficulty with understanding speech in loud places, she cannot generally hear her husband unless he is facing her when speaking.  She denies any significant pain to the ears, no drainage, no infections, no head and neck surgeries.  She used to work in [employer] and had some noise exposure at that time, she worked 8 years there.  She is currently retired.  She does note a history of seasonal allergies, she does not take any medications for this as they do not seem to bother her.  No history of head and neck cancer or any suspicious lesions.  She does note a family history of hearing loss in both her mother and grandmother who wore hearing aids.  She denies any new medications, no history of diabetes, autoimmune disease, or CAD.[name]  She does not have a significant smoking history with a 20-pack-year history quitting approximately 19 years ago.      H&N Surgery: no Personal or FHx of bleeding dz or anesthesia difficulty: no   GLP-1: no AP/AC: no  Tobacco: former smoker. Alcohol: no. Occupation: retired. Lives in husband.   Independent Review of Additional Tests or Records:  Audiogram 01/17/2023  Tympanometry: Right ear: Type A- Normal external ear canal volume with normal middle ear pressure and tympanic membrane compliance Left ear: Type A- Normal external ear  canal volume with normal middle ear pressure and tympanic membrane compliance   Pure tone Audiometry: Right ear- Normal to moderate sensorineural hearing loss from 250 Hz - 8000 Hz. Left ear-  Normal to moderately severe sensorineural hearing loss from 250 Hz - 8000 Hz.   Speech Audiometry: Right ear- Speech Reception Threshold (SRT) was obtained at 30 dBHL Left ear-Speech Reception Threshold (SRT) was obtained at 30 dBHL   Word Recognition Score Tested using NU-6 (MLV) Right ear: 96% was obtained at a presentation level of 70 dBHL with contralateral masking which is deemed as  excellent Left ear: 92% was obtained at a presentation level of 70 dBHL with contralateral masking which is deemed as  excellent   PMH/Meds/All/SocHx/FamHx/ROS:   Past Medical History:  Diagnosis Date   Allergic rhinitis    Anxiety    Asthma    DDD (degenerative disc disease)    also has spinal arthritis   Depression    Diabetes mellitus    borderline   Dyslipidemia    Fibromyalgia    GERD (gastroesophageal reflux disease)    Hypertension    Lung nodules    RIGHT LUNG--LAST EVALUATED BY CHEST CT 02/01/12 - STABLE AND FOLLOW UP PLANNED IN ONE YEAR.   Lyme disease    Obesity    OSA (obstructive sleep apnea) 05/10/2017   Sinusitis    Stress disorder, acute    Vitamin D deficiency      Past Surgical History:  Procedure Laterality Date   CESAREAN SECTION  1982, 1985  x 2    ENDOMETRIAL ABLATION  2009   HERNIA REPAIR  04/14/12   ventral hernia repair   Laparoscopic ventral hernia repair w/ mesh  08/2009   Dr. Janee Morn, [facility]   LAPAROSCOPY  2002   TUBAL LIGATION     VENTRAL HERNIA REPAIR  09/2006   Dr. Janee Morn   VENTRAL HERNIA REPAIR N/A 04/14/2012   Procedure: Laparoscopic Repair of  Ventral Hernia;  Surgeon: Valarie Merino, MD;  Location: [facility];  Service: General;  Laterality: N/A;  Repair of Recurrent Ventral Hernia    Family History  Problem Relation Age of Onset   Heart failure Mother     Diabetes Mother    Heart disease Mother 12   Arthritis Mother    Depression Mother    Hearing loss Mother    Hypertension Mother    Obesity Mother    Alzheimer's disease Father    Lung cancer Maternal Grandmother    Cancer Maternal Grandmother        lung   Colon cancer Neg Hx      Social Connections: Somewhat Isolated (07/09/2022)   Received from [facility], [facility]   Social Network    How would you rate your social network (family, work, friends)?: Restricted participation with some degree of social isolation      Current Outpatient Medications:    albuterol (VENTOLIN HFA) 108 (90 Base) MCG/ACT inhaler, INHALE 2 PUFFS INTO THE LUNGS EVERY 4 HOURS AS NEEDED FOR WHEEIZNG, Disp: 18 g, Rfl: 3   Cholecalciferol (VITAMIN D3) 1.25 MG (50000 UT) CAPS, Take 1 capsule (1.25 mg total) by mouth once a week., Disp: 12 capsule, Rfl: 0   estradiol (ESTRACE) 2 MG tablet, Take 2 mg by mouth daily., Disp: , Rfl:    hydrochlorothiazide (HYDRODIURIL) 12.5 MG tablet, TAKE 1 TABLET BY MOUTH EVERY DAY, Disp: 30 tablet, Rfl: 11   losartan-hydrochlorothiazide (HYZAAR) 50-12.5 MG tablet, Take 1 tablet by mouth daily., Disp: 90 tablet, Rfl: 3   NP THYROID 30 MG tablet, Take 30 mg by mouth daily., Disp: , Rfl:    progesterone (PROMETRIUM) 200 MG capsule, Take 200 mg by mouth daily., Disp: , Rfl:    Physical Exam:   There were no vitals taken for this visit.  Pertinent Findings  CN II-XII intact  Bilateral EAC clear and TM intact with well pneumatized middle ear spaces Weber 512: equal Rinne 512: AC > BC b/l   Anterior rhinoscopy: Septum midline No lesions of oral cavity/oropharynx; dentition  No obviously palpable neck masses/lymphadenopathy/thyromegaly No respiratory distress or stridor  Seprately Identifiable Procedures:  None  Impression & Plans:  [name] is a 62 y.o. female   Hearing loss-  The patient does have some hearing loss with normal to moderate sensorineural  hearing loss in bilateral ears.  I did discuss this with her that she may be candidate for hearing aids if she would like to proceed with amplification.  She would like to think about that.  Otherwise she has no other significant issues on her exam today.  Patient was given strict return precautions.  She verbalized understanding and agreement to today's plan.   - f/u PRN   Thank you for allowing me the opportunity to care for your patient. Please do not hesitate to contact me should you have any other questions.  Sincerely, Burna Forts PA-C [facility] Phone: [phone] Fax: [phone]  01/18/2023, 9:52 AM

## 2023-01-17 NOTE — Progress Notes (Signed)
  [address], [address] [place], [place] [address] [phone]  Audiological Evaluation    Name: Jaime Chapman     DOB:   Aug 31, 1960      MRN:   [id number]                                                                                     Service Date: 01/17/2023     Accompanied by: none   Patient comes today after Jaime Mechanic, PA-C sent a referral for a hearing evaluation due to concerns with hearing loss.   Symptoms Yes Details  Hearing loss  [x]  Both ears, noticed for a while  Tinnitus  [x]  At night may hear crickets.At times it may interfere with her sleep.  Ear pain/ Ear infections  []    Balance problems  []    Noise exposure  []    Previous ear surgeries  []    Family history  []    Amplification  []    Other  []      Otoscopy: Right ear: Clear external ear canals and notable landmarks visualized on the tympanic membrane. Left ear:  Clear external ear canals and notable landmarks visualized on the tympanic membrane.  Tympanometry: Right ear: Type A- Normal external ear canal volume with normal middle ear pressure and tympanic membrane compliance Left ear: Type A- Normal external ear canal volume with normal middle ear pressure and tympanic membrane compliance   Pure tone Audiometry: Right ear- Normal to moderate sensorineural hearing loss from 250 Hz - 8000 Hz. Left ear-  Normal to moderately severe sensorineural hearing loss from 250 Hz - 8000 Hz.  The hearing test results were completed under headphones and results are deemed to be of good reliability. Test technique:  conventional     Speech Audiometry: Right ear- Speech Reception Threshold (SRT) was obtained at 30 dBHL Left ear-Speech Reception Threshold (SRT) was obtained at 30 dBHL   Word Recognition Score Tested using NU-6 (MLV) Right ear: 96% was obtained at a presentation level of 70 dBHL with contralateral masking which is deemed as  excellent Left ear: 92% was obtained at a presentation level  of 70 dBHL with contralateral masking which is deemed as  excellent    Impression: There is not a significant difference in pure-tone thresholds between ears. There is not a significant difference in the word recognition score in between ears.    Recommendations: Follow up with ENT as scheduled for today.  Return for a hearing evaluation if concerns with hearing changes arise or per MD recommendation. Consider a communication needs assessment after medical clearance for hearing aids is obtained. Consider using a fan, white noise machine or sound generator at night, if the tinnitus affects her sleep.  Jaime Chapman, AUD

## 2023-01-18 ENCOUNTER — Encounter (INDEPENDENT_AMBULATORY_CARE_PROVIDER_SITE_OTHER): Payer: Self-pay

## 2023-01-19 DIAGNOSIS — F902 Attention-deficit hyperactivity disorder, combined type: Secondary | ICD-10-CM | POA: Diagnosis not present

## 2023-01-19 DIAGNOSIS — Z79899 Other long term (current) drug therapy: Secondary | ICD-10-CM | POA: Diagnosis not present

## 2023-01-21 ENCOUNTER — Encounter: Payer: Self-pay | Admitting: Audiology

## 2023-01-25 ENCOUNTER — Institutional Professional Consult (permissible substitution) (INDEPENDENT_AMBULATORY_CARE_PROVIDER_SITE_OTHER): Payer: BC Managed Care – PPO

## 2023-02-02 ENCOUNTER — Ambulatory Visit: Payer: BC Managed Care – PPO | Admitting: Psychology

## 2023-02-02 DIAGNOSIS — F411 Generalized anxiety disorder: Secondary | ICD-10-CM | POA: Diagnosis not present

## 2023-02-02 NOTE — Progress Notes (Signed)
[facility] Counselor/Therapist Progress Note  Patient ID: Jaime Chapman, MRN: [id number],    Date: 02/02/2023  Time Spent: 11:01am-11:48am     Treatment Type: Individual Therapy  pt is seen for a virtual video visit via caregility.  Pt consents to telehealth session and is aware of limitations of telehealth visits. Pt joins from her home, reporting privacy, and counselor from her home office.    Reported Symptoms: pt reports down, loss of interest   Mental Status Exam: Appearance:  Well Groomed     Behavior: Appropriate  Motor: Normal  Speech/Language:  Clear and Coherent  Affect: Appropriate  Mood: depressed  Thought process: normal  Thought content:   WNL  Sensory/Perceptual disturbances:   WNL  Orientation: oriented to person, place, time/date, and situation  Attention: Good  Concentration: Good  Memory: WNL  Fund of knowledge:  Good  Insight:   Good  Judgment:  Good  Impulse Control: Good   Risk Assessment: Danger to Self:  No Self-injurious Behavior: No Danger to Others: No Duty to Warn:no Physical Aggression / Violence:No  Access to Firearms a concern: No  Gang Involvement:No   Subjective: Counselor assessed pt current functioning per pt report. Processed w/pt stressors and mood.  Discussed stressful interactions in home.  Explored low motivation and behavioral activation.   Encouraged positive self care.  Pt affect wnl.  Pt reports biopsy results for granddaughter tumor were precancerous and come cancer markers still elevated following her f/u and being referred to oncologist.  Pt expressed feeling tired and low energy/motivation.  Pt reported that spent all weekend watching series and recognizes wants to get out of house for self care.  Pt identified some activities for self and working towards doing one things a day.    Interventions: Cognitive Behavioral Therapy and supportive  Diagnosis:Generalized anxiety disorder  Plan: Pt to f/u w/ weekly  to biweekly counseling.  Pt to f/u w/ referral to psychiatrist.    Individualized Treatment Plan Strengths: seeking counseling  Supports: husband    Goal/Needs for Treatment:  In order of importance to patient 1) cope w/ depression and anxiety 2) --- 3) ---    Client Statement of Needs: "Seeking safe space to talk through things. Find out why I do the things I do.  I self sabotage myself a lot and want to change."    Treatment Level:outpatient counseling  Symptoms:depressed mood, low motivation/loss of interest, fatigue, negative self worth  Client Treatment Preferences:weekly counseling.  F/u w/ a psychiatrist.    Healthcare consumer's goal for treatment:   Counselor, [name], [facility] will support the patient's ability to achieve the goals identified. Cognitive Behavioral Therapy, Assertive Communication/Conflict Resolution Training, Relaxation Training, ACT, Humanistic and other evidenced-based practices will be used to promote progress towards healthy functioning.    Healthcare consumer will: Actively participate in therapy, working towards healthy functioning.     *Justification for Continuation/Discontinuation of Goal: R=Revised, O=Ongoing, A=Achieved, D=Discontinued   Goal 1) Increase awareness of thoughts and behaviors that impacting depression and anxiety and increase healthier thought patterns, increase positive self care and effective coping skills. Baseline date 07/19/22: Progress towards goal 0; How Often - Daily Target Date Goal Was reviewed Status Code Progress towards goal/Likert rating  07/19/23                            This plan has been reviewed and created by the following participants:  This plan will be  reviewed at least every 12 months. Date Behavioral Health Clinician Date Guardian/Patient   07/19/22  [facility] [name] 07/19/22 Verbal Consent Provided                                     [name]

## 2023-02-13 ENCOUNTER — Other Ambulatory Visit: Payer: Self-pay | Admitting: Family Medicine

## 2023-02-14 NOTE — Telephone Encounter (Signed)
Last office visit 12/22/2022 for CPE.  Last refilled 12/22/2022 for #12 with no refills.  Vit D level 12/15/2022 which was low at 19.62 mg/nL. Next Appt: No future appointments.

## 2023-02-15 ENCOUNTER — Ambulatory Visit: Payer: BC Managed Care – PPO | Admitting: Psychology

## 2023-02-15 DIAGNOSIS — Z6831 Body mass index (BMI) 31.0-31.9, adult: Secondary | ICD-10-CM | POA: Diagnosis not present

## 2023-02-15 DIAGNOSIS — F411 Generalized anxiety disorder: Secondary | ICD-10-CM | POA: Diagnosis not present

## 2023-02-15 DIAGNOSIS — Z1231 Encounter for screening mammogram for malignant neoplasm of breast: Secondary | ICD-10-CM | POA: Diagnosis not present

## 2023-02-15 DIAGNOSIS — F330 Major depressive disorder, recurrent, mild: Secondary | ICD-10-CM

## 2023-02-15 DIAGNOSIS — Z01419 Encounter for gynecological examination (general) (routine) without abnormal findings: Secondary | ICD-10-CM | POA: Diagnosis not present

## 2023-02-15 LAB — HM MAMMOGRAPHY
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
## 2023-03-08 ENCOUNTER — Ambulatory Visit: Payer: BC Managed Care – PPO | Admitting: Psychology

## 2023-03-08 DIAGNOSIS — F331 Major depressive disorder, recurrent, moderate: Secondary | ICD-10-CM | POA: Diagnosis not present

## 2023-03-08 DIAGNOSIS — F411 Generalized anxiety disorder: Secondary | ICD-10-CM | POA: Diagnosis not present

## 2023-03-08 NOTE — Progress Notes (Signed)
 [facility] Counselor/Therapist Progress Note  Patient ID: Jaime Chapman, MRN: [id number],    Date: 03/08/2023  Time Spent: 9:01am-9:56am     Treatment Type: Individual Therapy  pt is seen for a virtual video visit via caregility.  Pt consents to telehealth session and is aware of limitations of telehealth visits. Pt joins from her car at home, reporting privacy, and counselor from her home office.    Reported Symptoms: pt reports low motivation, feeling drained w/ stressors and low energy  Mental Status Exam: Appearance:  Well Groomed     Behavior: Appropriate  Motor: Normal  Speech/Language:  Clear and Coherent  Affect: Appropriate  Mood: depressed  Thought process: normal  Thought content:   WNL  Sensory/Perceptual disturbances:   WNL  Orientation: oriented to person, place, time/date, and situation  Attention: Good  Concentration: Good  Memory: WNL  Fund of knowledge:  Good  Insight:   Good  Judgment:  Good  Impulse Control: Good   Risk Assessment: Danger to Self:  No Self-injurious Behavior: No Danger to Others: No Duty to Warn:no Physical Aggression / Violence:No  Access to Firearms a concern: No  Gang Involvement:No   Subjective: Counselor assessed pt current functioning per pt report. Processed w/pt stressors at home and impact on mood.  Explored pt supports for granddaughter and utilizing resources.  Assisted pt in recognizing benefits of connecting outside of house.  Explored options to assist w/ coping through depressed moods.    Pt affect wnl.  Pt reports she is feeling drained, difficulty w/ sleeping at night, low motivation to do things and recognizing depressed moods.  Pt reports stressors of granddaughter's mental health and recent stressful interactions.  Pt reports that waiting for residential placement to become available.  Pt recognized that spending more time on mindless games on her phone and potential steps to curb.  Pt discussed benefit  of going out for drives and connecting w/ others.  Pt looking forward to aunt's upcoming visit to mom.  Pt made commitment to engage w/ friends and w/ aunt's visit.   Interventions: Cognitive Behavioral Therapy and supportive  Diagnosis:Moderate episode of recurrent major depressive disorder (HCC)  Generalized anxiety disorder  Plan: Pt to f/u w/ weekly to biweekly counseling.  Pt to f/u w/ referral to psychiatrist.    Individualized Treatment Plan Strengths: seeking counseling  Supports: husband    Goal/Needs for Treatment:  In order of importance to patient 1) cope w/ depression and anxiety 2) --- 3) ---    Client Statement of Needs: Seeking safe space to talk through things. Find out why I do the things I do.  I self sabotage myself a lot and want to change.    Treatment Level:outpatient counseling  Symptoms:depressed mood, low motivation/loss of interest, fatigue, negative self worth  Client Treatment Preferences:weekly counseling.  F/u w/ a psychiatrist.    Healthcare consumer's goal for treatment:   Counselor, Damien Herald, [facility] will support the patient's ability to achieve the goals identified. Cognitive Behavioral Therapy, Assertive Communication/Conflict Resolution Training, Relaxation Training, ACT, Humanistic and other evidenced-based practices will be used to promote progress towards healthy functioning.    Healthcare consumer will: Actively participate in therapy, working towards healthy functioning.     *Justification for Continuation/Discontinuation of Goal: R=Revised, O=Ongoing, A=Achieved, D=Discontinued   Goal 1) Increase awareness of thoughts and behaviors that impacting depression and anxiety and increase healthier thought patterns, increase positive self care and effective coping skills. Baseline date 07/19/22: Progress towards goal  0; How Often - Daily Target Date Goal Was reviewed Status Code Progress towards goal/Likert rating  07/19/23                             This plan has been reviewed and created by the following participants:  This plan will be reviewed at least every 12 months. Date Behavioral Health Clinician Date Guardian/Patient   07/19/22  [facility] Jaime [facility] 07/19/22 Verbal Consent Provided                                      Jaime Chapman [facility]

## 2023-03-18 ENCOUNTER — Other Ambulatory Visit: Payer: Self-pay | Admitting: Family Medicine

## 2023-03-18 DIAGNOSIS — I10 Essential (primary) hypertension: Secondary | ICD-10-CM

## 2023-03-23 ENCOUNTER — Ambulatory Visit: Payer: BC Managed Care – PPO | Admitting: Psychology

## 2023-03-23 DIAGNOSIS — F411 Generalized anxiety disorder: Secondary | ICD-10-CM | POA: Diagnosis not present

## 2023-03-23 DIAGNOSIS — F330 Major depressive disorder, recurrent, mild: Secondary | ICD-10-CM

## 2023-03-24 NOTE — Progress Notes (Signed)
[facility] Counselor/Therapist Progress Note  Patient ID: ETHELEEN VANDENBOOM, MRN: [id number],    Date: 03/24/2023  Time Spent: 12:01pm-12:51pm     Treatment Type: Individual Therapy  pt is seen for a virtual video visit via caregility.  Pt consents to telehealth session and is aware of limitations of telehealth visits. Pt joins from her car at home, reporting privacy, and counselor from her home office.    Reported Symptoms: pt reports feeling drained and tired w/ recent stressors.  Pt identified positive w/ looking forward with planning garden.  Mental Status Exam: Appearance:  Well Groomed     Behavior: Appropriate  Motor: Normal  Speech/Language:  Clear and Coherent  Affect: Appropriate  Mood: depressed  Thought process: normal  Thought content:   WNL  Sensory/Perceptual disturbances:   WNL  Orientation: oriented to person, place, time/date, and situation  Attention: Good  Concentration: Good  Memory: WNL  Fund of knowledge:  Good  Insight:   Good  Judgment:  Good  Impulse Control: Good   Risk Assessment: Danger to Self:  No Self-injurious Behavior: No Danger to Others: No Duty to Warn:no Physical Aggression / Violence:No  Access to Firearms a concern: No  Gang Involvement:No   Subjective: Counselor assessed pt current functioning per pt report. Processed w/pt increased stressors w/ husband and impact on energy.   Validated and normalized feeling drained w/ recent stressors.  Discussed self car and positive for self w/ enjoying things that give her joy.  Pt affect congruent w/ report of tired.  Pt reports that since last session more stressors w/ husband going to ED and being admitted for couple days w/ pneumonia.  He has returned home coupe days ago and pt in caregiver mode.  Pt discussed continued stress w/ her granddaughter.  Pt recognizing that drained and able to give self permission to rest.  Pt reports she is enjoying getting seed catalogs and dreaming  and planning as gives some joy.   Interventions: Cognitive Behavioral Therapy and supportive  Diagnosis:Generalized anxiety disorder  Mild episode of recurrent major depressive disorder (HCC)  Plan: Pt to f/u w/ weekly to biweekly counseling.  Pt to f/u w/ referral to psychiatrist.    Individualized Treatment Plan Strengths: seeking counseling  Supports: husband    Goal/Needs for Treatment:  In order of importance to patient 1) cope w/ depression and anxiety 2) --- 3) ---    Client Statement of Needs: "Seeking safe space to talk through things. Find out why I do the things I do.  I self sabotage myself a lot and want to change."    Treatment Level:outpatient counseling  Symptoms:depressed mood, low motivation/loss of interest, fatigue, negative self worth  Client Treatment Preferences:weekly counseling.  F/u w/ a psychiatrist.    Healthcare consumer's goal for treatment:   Counselor, [name], [facility] will support the patient's ability to achieve the goals identified. Cognitive Behavioral Therapy, Assertive Communication/Conflict Resolution Training, Relaxation Training, ACT, Humanistic and other evidenced-based practices will be used to promote progress towards healthy functioning.    Healthcare consumer will: Actively participate in therapy, working towards healthy functioning.     *Justification for Continuation/Discontinuation of Goal: R=Revised, O=Ongoing, A=Achieved, D=Discontinued   Goal 1) Increase awareness of thoughts and behaviors that impacting depression and anxiety and increase healthier thought patterns, increase positive self care and effective coping skills. Baseline date 07/19/22: Progress towards goal 0; How Often - Daily Target Date Goal Was reviewed Status Code Progress towards goal/Likert rating  07/19/23  This plan has been reviewed and created by the following participants:  This plan will be reviewed at least every 12  months. Date Behavioral Health Clinician Date Guardian/Patient   07/19/22  [facility] [name] 07/19/22 Verbal Consent Provided                                   [name] [facility]

## 2023-04-04 ENCOUNTER — Encounter: Payer: Self-pay | Admitting: Family Medicine

## 2023-04-05 ENCOUNTER — Ambulatory Visit (INDEPENDENT_AMBULATORY_CARE_PROVIDER_SITE_OTHER): Payer: BC Managed Care – PPO | Admitting: Psychology

## 2023-04-05 DIAGNOSIS — F330 Major depressive disorder, recurrent, mild: Secondary | ICD-10-CM

## 2023-04-05 DIAGNOSIS — F411 Generalized anxiety disorder: Secondary | ICD-10-CM | POA: Diagnosis not present

## 2023-04-05 NOTE — Progress Notes (Signed)
  [facility] Counselor/Therapist Progress Note  Patient ID: Jaime Chapman, MRN: [id number],    Date: 04/05/2023  Time Spent: 9:00am-9:57am     Treatment Type: Individual Therapy  pt is seen for a virtual video visit via caregility.  Pt consents to telehealth session and is aware of limitations of telehealth visits. Pt joins from her car at home, reporting privacy, and counselor from her home office.    Reported Symptoms: pt reports recent illness that has impacted energy/fatigue.    Mental Status Exam: Appearance:  Well Groomed     Behavior: Appropriate  Motor: Normal  Speech/Language:  Clear and Coherent  Affect: Appropriate  Mood: depressed  Thought process: normal  Thought content:   WNL  Sensory/Perceptual disturbances:   WNL  Orientation: oriented to person, place, time/date, and situation  Attention: Good  Concentration: Good  Memory: WNL  Fund of knowledge:  Good  Insight:   Good  Judgment:  Good  Impulse Control: Good   Risk Assessment: Danger to Self:  No Self-injurious Behavior: No Danger to Others: No Duty to Warn:no Physical Aggression / Violence:No  Access to Firearms a concern: No  Gang Involvement:No   Subjective: Counselor assessed pt current functioning per pt report. Processed w/pt recent illness and impact.  Discussed stressors and stress management w/ self care and restorative practices.   Pt affect congruent w/ report of tired. Pt reports she didn't sleep well last night.  Pt reports just getting over noravirus that has spread through family.  Pt reports on stressors and ways she is managing stress by not engaging w/ added stressors.  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  Pt discussed added stressors of political environment..   Interventions: Cognitive Behavioral Therapy and supportive  Diagnosis:Generalized anxiety disorder  Mild episode of recurrent major depressive disorder (HCC)  Plan: Pt to f/u w/  weekly to biweekly counseling.  Pt to f/u w/ referral to psychiatrist.    Individualized Treatment Plan Strengths: seeking counseling  Supports: husband    Goal/Needs for Treatment:  In order of importance to patient 1) cope w/ depression and anxiety 2) --- 3) ---    Client Statement of Needs: Seeking safe space to talk through things. Find out why I do the things I do.  I self sabotage myself a lot and want to change.    Treatment Level:outpatient counseling  Symptoms:depressed mood, low motivation/loss of interest, fatigue, negative self worth  Client Treatment Preferences:weekly counseling.  F/u w/ a psychiatrist.    Healthcare consumer's goal for treatment:   Counselor, Damien Herald, [facility] will support the patient's ability to achieve the goals identified. Cognitive Behavioral Therapy, Assertive Communication/Conflict Resolution Training, Relaxation Training, ACT, Humanistic and other evidenced-based practices will be used to promote progress towards healthy functioning.    Healthcare consumer will: Actively participate in therapy, working towards healthy functioning.     *Justification for Continuation/Discontinuation of Goal: R=Revised, O=Ongoing, A=Achieved, D=Discontinued   Goal 1) Increase awareness of thoughts and behaviors that impacting depression and anxiety and increase healthier thought patterns, increase positive self care and effective coping skills. Baseline date 07/19/22: Progress towards goal 0; How Often - Daily Target Date Goal Was reviewed Status Code Progress towards goal/Likert rating  07/19/23                            This plan has been reviewed and created by the following participants:  This plan  will be reviewed at least every 12 months. Date Behavioral Health Clinician Date Guardian/Patient   07/19/22  [facility] 07/19/22 Verbal Consent Provided                                 [name]

## 2023-04-19 ENCOUNTER — Ambulatory Visit: Payer: BC Managed Care – PPO | Admitting: Psychology

## 2023-04-19 DIAGNOSIS — F331 Major depressive disorder, recurrent, moderate: Secondary | ICD-10-CM | POA: Diagnosis not present

## 2023-04-19 DIAGNOSIS — F411 Generalized anxiety disorder: Secondary | ICD-10-CM

## 2023-04-19 NOTE — Progress Notes (Signed)
 [facility] Counselor/Therapist Progress Note  Patient ID: Jaime Chapman, MRN: [id number],    Date: 04/19/2023  Time Spent: 9:00am-9:50am     Treatment Type: Individual Therapy  pt is seen for a virtual video visit via caregility.  Pt consents to telehealth session and is aware of limitations of telehealth visits. Pt joins from her car at home, reporting privacy, and counselor from her home office.    Reported Symptoms: pt reports some fatigue and low motivation.  pt reports planning for things to engage.   Mental Status Exam: Appearance:  Well Groomed     Behavior: Appropriate  Motor: Normal  Speech/Language:  Clear and Coherent  Affect: Appropriate  Mood: depressed  Thought process: normal  Thought content:   WNL  Sensory/Perceptual disturbances:   WNL  Orientation: oriented to person, place, time/date, and situation  Attention: Good  Concentration: Good  Memory: WNL  Fund of knowledge:  Good  Insight:   Good  Judgment:  Good  Impulse Control: Good   Risk Assessment: Danger to Self:  No Self-injurious Behavior: No Danger to Others: No Duty to Warn:no Physical Aggression / Violence:No  Access to Firearms a concern: No  Gang Involvement:No   Subjective: Counselor assessed pt current functioning per pt report. Processed w/pt recent positives and stressors.  Explored w/ pt mood through winter months and improved from previous winter months.  Discussed ways to keep engaged w/ activities for maintaining improving  mood.   Pt affect wnl.  Pt reports some fatigue, low motivation and some days w/ down mood.  Pt reports less depressed than previous winters and feels hope w/ getting through winter.  Pt discussed some things she is planning to active/engaged w/ activities that she enjoys.    Interventions: Cognitive Behavioral Therapy and supportive  Diagnosis:Generalized anxiety disorder  Moderate episode of recurrent major depressive disorder (HCC)  Plan: Pt  to f/u w/ weekly to biweekly counseling.  Pt to f/u w/ referral to psychiatrist.    Individualized Treatment Plan Strengths: seeking counseling  Supports: husband    Goal/Needs for Treatment:  In order of importance to patient 1) cope w/ depression and anxiety 2) --- 3) ---    Client Statement of Needs: "Seeking safe space to talk through things. Find out why I do the things I do.  I self sabotage myself a lot and want to change."    Treatment Level:outpatient counseling  Symptoms:depressed mood, low motivation/loss of interest, fatigue, negative self worth  Client Treatment Preferences:weekly counseling.  F/u w/ a psychiatrist.    Healthcare consumer's goal for treatment:   Counselor, [name], [facility] will support the patient's ability to achieve the goals identified. Cognitive Behavioral Therapy, Assertive Communication/Conflict Resolution Training, Relaxation Training, ACT, Humanistic and other evidenced-based practices will be used to promote progress towards healthy functioning.    Healthcare consumer will: Actively participate in therapy, working towards healthy functioning.     *Justification for Continuation/Discontinuation of Goal: R=Revised, O=Ongoing, A=Achieved, D=Discontinued   Goal 1) Increase awareness of thoughts and behaviors that impacting depression and anxiety and increase healthier thought patterns, increase positive self care and effective coping skills. Baseline date 07/19/22: Progress towards goal 0; How Often - Daily Target Date Goal Was reviewed Status Code Progress towards goal/Likert rating  07/19/23                            This plan has been reviewed and created by the following  participants:  This plan will be reviewed at least every 12 months. Date Behavioral Health Clinician Date Guardian/Patient   07/19/22  [facility] [name] [facility] 07/19/22 Verbal Consent Provided                                  [name] [facility]

## 2023-05-03 ENCOUNTER — Ambulatory Visit: Payer: BC Managed Care – PPO | Admitting: Psychology

## 2023-05-03 DIAGNOSIS — F331 Major depressive disorder, recurrent, moderate: Secondary | ICD-10-CM | POA: Diagnosis not present

## 2023-05-03 DIAGNOSIS — F411 Generalized anxiety disorder: Secondary | ICD-10-CM | POA: Diagnosis not present

## 2023-05-03 NOTE — Progress Notes (Signed)
 [facility] Counselor/Therapist Progress Note  Patient ID: Jaime Chapman, MRN: [id number],    Date: 05/03/2023  Time Spent: 9:00am-9:55am     Treatment Type: Individual Therapy  pt is seen for a virtual video visit via caregility.  Pt consents to telehealth session and is aware of limitations of telehealth visits. Pt joins from her car at home, reporting privacy, and counselor from her home office.    Reported Symptoms: pt reports worry/anxiety, some days low motivation, some feeling more engaged and looking forward to things.  Mental Status Exam: Appearance:  Well Groomed     Behavior: Appropriate  Motor: Normal  Speech/Language:  Clear and Coherent  Affect: Appropriate  Mood: anxious and depressed  Thought process: normal  Thought content:   WNL  Sensory/Perceptual disturbances:   WNL  Orientation: oriented to person, place, time/date, and situation  Attention: Good  Concentration: Good  Memory: WNL  Fund of knowledge:  Good  Insight:   Good  Judgment:  Good  Impulse Control: Good   Risk Assessment: Danger to Self:  No Self-injurious Behavior: No Danger to Others: No Duty to Warn:no Physical Aggression / Violence:No  Access to Firearms a concern: No  Gang Involvement:No   Subjective: Counselor assessed pt current functioning per pt report. Processed w/pt recent stressors and coping through. Validated worries for granddaughter and exhaustion feels.   Explored things that interest and planning for engaging w/.    Pt affect wnl.  Pt reports some low  motivation days and some days more interest in engaging.  Pt reports worries for granddaughter and recent interactions and poor decision making.  Pt discussed things she  has felt good about doing in past couple weeks, friend visiting next week in area.  Pt discussed ready for spring and gardening.    Interventions: Cognitive Behavioral Therapy and supportive  Diagnosis:Generalized anxiety disorder  Moderate  episode of recurrent major depressive disorder (HCC)  Plan: Pt to f/u w/ weekly to biweekly counseling.  Pt to f/u w/ referral to psychiatrist.    Individualized Treatment Plan Strengths: seeking counseling  Supports: husband    Goal/Needs for Treatment:  In order of importance to patient 1) cope w/ depression and anxiety 2) --- 3) ---    Client Statement of Needs: "Seeking safe space to talk through things. Find out why I do the things I do.  I self sabotage myself a lot and want to change."    Treatment Level:outpatient counseling  Symptoms:depressed mood, low motivation/loss of interest, fatigue, negative self worth  Client Treatment Preferences:weekly counseling.  F/u w/ a psychiatrist.    Healthcare consumer's goal for treatment:   Counselor, [name], [facility] will support the patient's ability to achieve the goals identified. Cognitive Behavioral Therapy, Assertive Communication/Conflict Resolution Training, Relaxation Training, ACT, Humanistic and other evidenced-based practices will be used to promote progress towards healthy functioning.    Healthcare consumer will: Actively participate in therapy, working towards healthy functioning.     *Justification for Continuation/Discontinuation of Goal: R=Revised, O=Ongoing, A=Achieved, D=Discontinued   Goal 1) Increase awareness of thoughts and behaviors that impacting depression and anxiety and increase healthier thought patterns, increase positive self care and effective coping skills. Baseline date 07/19/22: Progress towards goal 0; How Often - Daily Target Date Goal Was reviewed Status Code Progress towards goal/Likert rating  07/19/23                            This  plan has been reviewed and created by the following participants:  This plan will be reviewed at least every 12 months. Date Behavioral Health Clinician Date Guardian/Patient   07/19/22  [facility] [name] [facility] 07/19/22 Verbal Consent Provided                                      [name] [facility]

## 2023-05-14 ENCOUNTER — Other Ambulatory Visit: Payer: Self-pay | Admitting: Family Medicine

## 2023-05-16 NOTE — Telephone Encounter (Signed)
 Last office visit 12/22/2022 for CPE.  Last refilled 02/15/23 for #12 with no refills.  Last Vit D level 12/15/2022 which was low at 19.62.  Next Appt: No future appointments.

## 2023-05-17 ENCOUNTER — Ambulatory Visit: Payer: BC Managed Care – PPO | Admitting: Psychology

## 2023-05-17 DIAGNOSIS — F330 Major depressive disorder, recurrent, mild: Secondary | ICD-10-CM

## 2023-05-17 DIAGNOSIS — F411 Generalized anxiety disorder: Secondary | ICD-10-CM

## 2023-05-17 NOTE — Progress Notes (Signed)
 [facility] Counselor/Therapist Progress Note  Patient ID: Jaime Chapman, MRN: [id number],    Date: 05/17/2023  Time Spent: 9:00am-9:54am     Treatment Type: Individual Therapy  pt is seen for a virtual video visit via caregility.  Pt consents to telehealth session and is aware of limitations of telehealth visits. Pt joins from her car at home, reporting privacy, and counselor from her home office.    Reported Symptoms: pt reports increased self care, decreased loss of interest.  Pt reports anxiety w/ granddaughter mental health.    Mental Status Exam: Appearance:  Well Groomed     Behavior: Appropriate  Motor: Normal  Speech/Language:  Clear and Coherent  Affect: Appropriate  Mood: anxious  Thought process: normal  Thought content:   WNL  Sensory/Perceptual disturbances:   WNL  Orientation: oriented to person, place, time/date, and situation  Attention: Good  Concentration: Good  Memory: WNL  Fund of knowledge:  Good  Insight:   Good  Judgment:  Good  Impulse Control: Good   Risk Assessment: Danger to Self:  No Self-injurious Behavior: No Danger to Others: No Duty to Warn:no Physical Aggression / Violence:No  Access to Firearms a concern: No  Gang Involvement:No   Subjective: Counselor assessed pt current functioning per pt report. Processed w/pt recent positives, stressors and emotions.  Validated worries for granddaughter and drain has on her.  Encouraged pt to continue to advocate w/ inpt tx case manager and care coordinator w/ medicaid for granddaughters tx.   Reflected positives of engagement w/ interests and time w/ husband that have enjoyed and benefit has for her.   Pt affect wnl.  Pt reports stressor w/ granddaughter's mental health and return to inpt tx for suicide.  Pt discussed w/ knowing she is safe allowed her space to enjoy going out w/ husband and benefit had for her.  Pt reported she has been enjoying things outside.  Pt also reported  positive of visit w/ nutritionist and attending to her own self care.   Interventions: Cognitive Behavioral Therapy and supportive  Diagnosis:Generalized anxiety disorder  Mild episode of recurrent major depressive disorder (HCC)  Plan: Pt to f/u w/ weekly to biweekly counseling.  Pt to f/u w/ referral to psychiatrist.    Individualized Treatment Plan Strengths: seeking counseling  Supports: husband    Goal/Needs for Treatment:  In order of importance to patient 1) cope w/ depression and anxiety 2) --- 3) ---    Client Statement of Needs: "Seeking safe space to talk through things. Find out why I do the things I do.  I self sabotage myself a lot and want to change."    Treatment Level:outpatient counseling  Symptoms:depressed mood, low motivation/loss of interest, fatigue, negative self worth  Client Treatment Preferences:weekly counseling.  F/u w/ a psychiatrist.    Healthcare consumer's goal for treatment:   Counselor, [name], [facility] will support the patient's ability to achieve the goals identified. Cognitive Behavioral Therapy, Assertive Communication/Conflict Resolution Training, Relaxation Training, ACT, Humanistic and other evidenced-based practices will be used to promote progress towards healthy functioning.    Healthcare consumer will: Actively participate in therapy, working towards healthy functioning.     *Justification for Continuation/Discontinuation of Goal: R=Revised, O=Ongoing, A=Achieved, D=Discontinued   Goal 1) Increase awareness of thoughts and behaviors that impacting depression and anxiety and increase healthier thought patterns, increase positive self care and effective coping skills. Baseline date 07/19/22: Progress towards goal 0; How Often - Daily Target Date Goal Was reviewed  Status Code Progress towards goal/Likert rating  07/19/23                            This plan has been reviewed and created by the following participants:  This plan  will be reviewed at least every 12 months. Date Behavioral Health Clinician Date Guardian/Patient   07/19/22  [facility] [name] [facility] 07/19/22 Verbal Consent Provided                                  [name] [facility]

## 2023-05-31 ENCOUNTER — Ambulatory Visit (INDEPENDENT_AMBULATORY_CARE_PROVIDER_SITE_OTHER): Admitting: Psychology

## 2023-05-31 DIAGNOSIS — F411 Generalized anxiety disorder: Secondary | ICD-10-CM | POA: Diagnosis not present

## 2023-05-31 DIAGNOSIS — F330 Major depressive disorder, recurrent, mild: Secondary | ICD-10-CM | POA: Diagnosis not present

## 2023-05-31 NOTE — Progress Notes (Signed)
 [facility] Counselor/Therapist Progress Note  Patient ID: Jaime Chapman, MRN: [id number],    Date: 05/31/2023  Time Spent: 9:00am-10:01am     Treatment Type: Individual Therapy  pt is seen for a virtual video visit via caregility.  Pt consents to telehealth session and is aware of limitations of telehealth visits. Pt joins from her car at home, reporting privacy, and counselor from her home office.    Reported Symptoms: pt reports some anxiety.  Pt reports some increased initiative w/ med change.  Pt reports stressors w/ husband health and granddaughter's upcoming placement.  Mental Status Exam: Appearance:  Well Groomed     Behavior: Appropriate  Motor: Normal  Speech/Language:  Clear and Coherent  Affect: Appropriate  Mood: anxious  Thought process: normal  Thought content:   WNL  Sensory/Perceptual disturbances:   WNL  Orientation: oriented to person, place, time/date, and situation  Attention: Good  Concentration: Good  Memory: WNL  Fund of knowledge:  Good  Insight:   Good  Judgment:  Good  Impulse Control: Good   Risk Assessment: Danger to Self:  No Self-injurious Behavior: No Danger to Others: No Duty to Warn:no Physical Aggression / Violence:No  Access to Firearms a concern: No  Gang Involvement:No   Subjective: Counselor assessed pt current functioning per pt report. Processed w/pt recent stressors and upcoming transitions.  Explored worries and assisted w/ reframing and focus on taking one task at time.  Encouraged pt to focus on self care going into next week.    Pt affect wnl.  Pt reports stressor w/ husband's fall and breaking foot.  Pt reports granddaughter has group home placement that starts in 2 days.  Pt reports stressed w/ everything to get done to prepare and anxiety about granddaughter sabotaging.  Pt reflected on taking one thing at time, utilizing supports to assist her and granddaughter.  Pt reframed not focus on future what ifs.  Pt  discussed cataract surgery in 3 days for self.  Pt reflected on self care.  Pt reports [facility] started on Vyvanse and feels that helping more.    Interventions: Cognitive Behavioral Therapy and supportive  Diagnosis:Generalized anxiety disorder  Mild episode of recurrent major depressive disorder (HCC)  Plan: Pt to f/u w/ weekly to biweekly counseling.  Pt to f/u w/ referral to psychiatrist.    Individualized Treatment Plan Strengths: seeking counseling  Supports: husband    Goal/Needs for Treatment:  In order of importance to patient 1) cope w/ depression and anxiety 2) --- 3) ---    Client Statement of Needs: "Seeking safe space to talk through things. Find out why I do the things I do.  I self sabotage myself a lot and want to change."    Treatment Level:outpatient counseling  Symptoms:depressed mood, low motivation/loss of interest, fatigue, negative self worth  Client Treatment Preferences:weekly counseling.  F/u w/ a psychiatrist.    Healthcare consumer's goal for treatment:   Counselor, [name], [place] will support the patient's ability to achieve the goals identified. Cognitive Behavioral Therapy, Assertive Communication/Conflict Resolution Training, Relaxation Training, ACT, Humanistic and other evidenced-based practices will be used to promote progress towards healthy functioning.    Healthcare consumer will: Actively participate in therapy, working towards healthy functioning.     *Justification for Continuation/Discontinuation of Goal: R=Revised, O=Ongoing, A=Achieved, D=Discontinued   Goal 1) Increase awareness of thoughts and behaviors that impacting depression and anxiety and increase healthier thought patterns, increase positive self care and effective coping skills.  Baseline date 07/19/22: Progress towards goal 0; How Often - Daily Target Date Goal Was reviewed Status Code Progress towards goal/Likert rating  07/19/23                             This plan has been reviewed and created by the following participants:  This plan will be reviewed at least every 12 months. Date Behavioral Health Clinician Date Guardian/Patient   07/19/22  [facility] [name] [facility] 07/19/22 Verbal Consent Provided                                   [name] [facility]

## 2023-06-02 HISTORY — PX: CATARACT EXTRACTION: SUR2

## 2023-06-14 ENCOUNTER — Ambulatory Visit (INDEPENDENT_AMBULATORY_CARE_PROVIDER_SITE_OTHER): Admitting: Psychology

## 2023-06-14 DIAGNOSIS — F411 Generalized anxiety disorder: Secondary | ICD-10-CM | POA: Diagnosis not present

## 2023-06-14 DIAGNOSIS — F330 Major depressive disorder, recurrent, mild: Secondary | ICD-10-CM

## 2023-06-14 NOTE — Progress Notes (Signed)
 [facility] Counselor/Therapist Progress Note  Patient ID: Jaime Chapman, MRN: [id number],    Date: 06/14/2023  Time Spent: 9:01am-9:58am     Treatment Type: Individual Therapy  pt is seen for a virtual video visit via caregility.  Pt consents to telehealth session and is aware of limitations of telehealth visits. Pt joins from her car at home, reporting privacy, and counselor from her home office.    Reported Symptoms: pt reports feeling some relief and focus on giving time to relax.  Mental Status Exam: Appearance:  Well Groomed     Behavior: Appropriate  Motor: Normal  Speech/Language:  Clear and Coherent  Affect: Appropriate  Mood: anxious  Thought process: normal  Thought content:   WNL  Sensory/Perceptual disturbances:   WNL  Orientation: oriented to person, place, time/date, and situation  Attention: Good  Concentration: Good  Memory: WNL  Fund of knowledge:  Good  Insight:   Good  Judgment:  Good  Impulse Control: Good   Risk Assessment: Danger to Self:  No Self-injurious Behavior: No Danger to Others: No Duty to Warn:no Physical Aggression / Violence:No  Access to Firearms a concern: No  Gang Involvement:No   Subjective: Counselor assessed pt current functioning per pt report. Processed w/pt transition w/ granddaughter going into placement.  Discussed giving self time for decompressing and ways of engaging w/ activities that grounding and enjoys.    Pt affect wnl.  Pt she is feeling like she can breath and relax.  Pt reports that her granddaughter did go into group home placement 1.5 weeks ago.  Pt discussed things she is engaging w/ outside and gardening. Pt recognized ways of relaxing self to give opportunity for system to relax from stress/tension been experiencing.    Interventions: Cognitive Behavioral Therapy and supportive  Diagnosis:Generalized anxiety disorder  Mild episode of recurrent major depressive disorder (HCC)  Plan: Pt to  f/u w/  biweekly counseling.  Pt to f/u w/ referral to psychiatrist.    Individualized Treatment Plan Strengths: seeking counseling  Supports: husband    Goal/Needs for Treatment:  In order of importance to patient 1) cope w/ depression and anxiety 2) --- 3) ---    Client Statement of Needs: "Seeking safe space to talk through things. Find out why I do the things I do.  I self sabotage myself a lot and want to change."    Treatment Level:outpatient counseling  Symptoms:depressed mood, low motivation/loss of interest, fatigue, negative self worth  Client Treatment Preferences:weekly counseling.  F/u w/ a psychiatrist.    Healthcare consumer's goal for treatment:   Counselor, [name], [facility] will support the patient's ability to achieve the goals identified. Cognitive Behavioral Therapy, Assertive Communication/Conflict Resolution Training, Relaxation Training, ACT, Humanistic and other evidenced-based practices will be used to promote progress towards healthy functioning.    Healthcare consumer will: Actively participate in therapy, working towards healthy functioning.     *Justification for Continuation/Discontinuation of Goal: R=Revised, O=Ongoing, A=Achieved, D=Discontinued   Goal 1) Increase awareness of thoughts and behaviors that impacting depression and anxiety and increase healthier thought patterns, increase positive self care and effective coping skills. Baseline date 07/19/22: Progress towards goal 0; How Often - Daily Target Date Goal Was reviewed Status Code Progress towards goal/Likert rating  07/19/23                            This plan has been reviewed and created by the following participants:  This plan will be reviewed at least every 12 months. Date Behavioral Health Clinician Date Guardian/Patient   07/19/22  [facility] [name] [facility] 07/19/22 Verbal Consent Provided                                   [name] [facility]

## 2023-06-20 ENCOUNTER — Other Ambulatory Visit: Payer: Self-pay

## 2023-06-20 ENCOUNTER — Ambulatory Visit: Payer: Self-pay

## 2023-06-20 ENCOUNTER — Emergency Department (HOSPITAL_COMMUNITY)
Admission: EM | Admit: 2023-06-20 | Discharge: 2023-06-21 | Discharge status: Against Medical Advice | Attending: Emergency Medicine | Admitting: Emergency Medicine

## 2023-06-20 ENCOUNTER — Emergency Department (HOSPITAL_COMMUNITY)

## 2023-06-20 DIAGNOSIS — R531 Weakness: Secondary | ICD-10-CM | POA: Insufficient documentation

## 2023-06-20 DIAGNOSIS — Z5321 Procedure and treatment not carried out due to patient leaving prior to being seen by health care provider: Secondary | ICD-10-CM | POA: Insufficient documentation

## 2023-06-20 DIAGNOSIS — R519 Headache, unspecified: Secondary | ICD-10-CM | POA: Diagnosis not present

## 2023-06-20 DIAGNOSIS — R42 Dizziness and giddiness: Secondary | ICD-10-CM | POA: Insufficient documentation

## 2023-06-20 LAB — CBC WITH DIFFERENTIAL/PLATELET
Abs Immature Granulocytes: 0.02 10*3/uL (ref 0.00–0.07)
Basophils Absolute: 0 10*3/uL (ref 0.0–0.1)
Basophils Relative: 0 %
Eosinophils Absolute: 0 10*3/uL (ref 0.0–0.5)
Eosinophils Relative: 1 %
HCT: 42.9 % (ref 36.0–46.0)
Hemoglobin: 14.6 g/dL (ref 12.0–15.0)
Immature Granulocytes: 0 %
Lymphocytes Relative: 23 %
Lymphs Abs: 1.6 10*3/uL (ref 0.7–4.0)
MCH: 29.4 pg (ref 26.0–34.0)
MCHC: 34 g/dL (ref 30.0–36.0)
MCV: 86.5 fL (ref 80.0–100.0)
Monocytes Absolute: 0.4 10*3/uL (ref 0.1–1.0)
Monocytes Relative: 6 %
Neutro Abs: 4.9 10*3/uL (ref 1.7–7.7)
Neutrophils Relative %: 70 %
Platelets: 256 10*3/uL (ref 150–400)
RBC: 4.96 MIL/uL (ref 3.87–5.11)
RDW: 12.3 % (ref 11.5–15.5)
WBC: 7 10*3/uL (ref 4.0–10.5)
nRBC: 0 % (ref 0.0–0.2)

## 2023-06-20 LAB — COMPREHENSIVE METABOLIC PANEL WITH GFR
ALT: 13 U/L (ref 0–44)
AST: 18 U/L (ref 15–41)
Albumin: 4.2 g/dL (ref 3.5–5.0)
Alkaline Phosphatase: 33 U/L — ABNORMAL LOW (ref 38–126)
Anion gap: 10 (ref 5–15)
BUN: 19 mg/dL (ref 8–23)
CO2: 24 mmol/L (ref 22–32)
Calcium: 9.6 mg/dL (ref 8.9–10.3)
Chloride: 105 mmol/L (ref 98–111)
Creatinine, Ser: 0.94 mg/dL (ref 0.44–1.00)
GFR, Estimated: >60 mL/min (ref >60)
Glucose, Bld: 103 mg/dL — ABNORMAL HIGH (ref 70–99)
Potassium: 3.2 mmol/L — ABNORMAL LOW (ref 3.5–5.1)
Sodium: 139 mmol/L (ref 135–145)
Total Bilirubin: 0.7 mg/dL (ref 0.0–1.2)
Total Protein: 7.2 g/dL (ref 6.5–8.1)

## 2023-06-20 LAB — MAGNESIUM: Magnesium: 2.1 mg/dL (ref 1.7–2.4)

## 2023-06-20 LAB — TSH: TSH: 1.327 u[IU]/mL (ref 0.350–4.500)

## 2023-06-20 MED ORDER — MECLIZINE HCL 25 MG PO TABS: 25.0000 mg | ORAL_TABLET | Freq: Once | ORAL | Status: DC

## 2023-06-20 NOTE — Telephone Encounter (Signed)
  Chief Complaint: dizziness Symptoms: unsteady gait, "head feels off",  Frequency: about 2 days Pertinent Negatives: Patient denies vision changes,  Disposition: [x] ED /[] Urgent Care (no appt availability in office) / [] Appointment(In office/virtual)/ []  [facility]/ [] Home Care/ [] Refused Recommended Disposition /[] [place] [facility]/ []  Follow-up with PCP Additional Notes: Pt states that she woke 2 days ago with dizziness, pt states that she was not steady when walking. Pt states that it has not subsided. Pt states that her head "feels off" and "head feels funny". Pt admits to cataract surgery on 4/4. Pt states states that she is developing a HA at this time. Pt states that she has never had this before. States that she feels like she is spinning slowly. Pt advised ED, pt agreeable.  Copied from CRM [id number]. Topic: Clinical - Red Word Triage >> Jun 20, 2023  1:39 PM Shereese L wrote: [facility] that prompted transfer to Nurse Triage: dizzy spells and fatigue since saturday Reason for Disposition  Patient sounds very sick or weak to the triager  Answer Assessment - Initial Assessment Questions 1. DESCRIPTION: "Describe your dizziness."     Slow spin 2. VERTIGO: "Do you feel like either you or the room is spinning or tilting?"      I'm spinning 3. LIGHTHEADED: "Do you feel lightheaded?" (e.g., somewhat faint, woozy, weak upon standing)     weak 4. SEVERITY: "How bad is it?"  "Can you walk?"   - MILD: Feels slightly dizzy and unsteady, but is walking normally.   - MODERATE: Feels unsteady when walking, but not falling; interferes with normal activities (e.g., school, work).   - SEVERE: Unable to walk without falling, or requires assistance to walk without falling.     moderate 5. ONSET:  "When did the dizziness begin?"     2 days ago 6. AGGRAVATING FACTORS: "Does anything make it worse?" (e.g., standing, change in head position)     denies 7. CAUSE: "What do you think is  causing the dizziness?"     unsure 8. RECURRENT SYMPTOM: "Have you had dizziness before?" If Yes, ask: "When was the last time?" "What happened that time?"     Yea had it before, but not like this, states it is different  9. OTHER SYMPTOMS: "Do you have any other symptoms?" (e.g., headache, weakness, numbness, vomiting, earache)     Cataract surgery on 4/4.  Protocols used: Dizziness - Vertigo-A-AH

## 2023-06-20 NOTE — ED Triage Notes (Signed)
 Pt c/o dizziness and feeling off balance since Saturday am; states it has continued intermittently; today c/o generalized weakness; denies fevers; endorses decreased PO intake; recently started taking zepbound and vyvanse; no unilateral weakness, no other Neuro deficits  Verbal consent given for mse

## 2023-06-20 NOTE — ED Provider Triage Note (Signed)
 Emergency Medicine Provider Triage Evaluation Note  Jaime Chapman , a 63 y.o. female  was evaluated in triage.  Pt complains of feeling "swimmy headed" for the past 3 days.  Reports feeling generally weak.  Denies any double vision or room spinning.  Denies any chest pain or shortness of breath.  She started Vyvanse and Zepbound about 1 month ago, but no other new medications.  No unilateral weakness, slurred speech, facial droop.  Review of Systems  Positive: As above Negative: As above  Physical Exam  BP 126/78 (BP Location: Right Arm)   Pulse 83   Temp 97.7 F (36.5 C)   Resp 18   Wt 80.3 kg   SpO2 100%   BMI 29.45 kg/m  Gen:   Awake, no distress   Resp:  Normal effort  MSK:   Moves extremities without difficulty  Other:  No focal neurologic deficit.  Smiles symmetrically, raises eyebrows symmetrically.  No drift in the upper extremities bilaterally  Medical Decision Making  Medically screening exam initiated at 5:02 PM.  Appropriate orders placed.  Jaime Chapman was informed that the remainder of the evaluation will be completed by another provider, this initial triage assessment does not replace that evaluation, and the importance of remaining in the ED until their evaluation is complete.     Rexie Catena, PA-C 06/20/23 1703

## 2023-06-21 ENCOUNTER — Telehealth: Payer: Self-pay

## 2023-06-21 NOTE — Telephone Encounter (Signed)
 Copied from CRM [id number]. Topic: Clinical - Medical Advice >> Jun 21, 2023 11:26 AM Rosamond Comes wrote: Reason for CRM: patient calling in was seen in ED 06/20/23, patient had CT and labs drawn and is asking for results and wanting to know what to do. >> Jun 21, 2023 11:32 AM Terence Fend E wrote: Patient would like a phone call regarding imaging result.  Please call patient to set up ED follow up with PCP

## 2023-06-21 NOTE — Telephone Encounter (Signed)
 Noted.

## 2023-06-21 NOTE — ED Notes (Signed)
 Pt stated she called her husband to come get her and she is leaving

## 2023-06-21 NOTE — Telephone Encounter (Signed)
 Pt is schedule for ED follow up

## 2023-06-23 ENCOUNTER — Encounter: Payer: Self-pay | Admitting: Family Medicine

## 2023-06-23 ENCOUNTER — Ambulatory Visit (INDEPENDENT_AMBULATORY_CARE_PROVIDER_SITE_OTHER): Admitting: Family Medicine

## 2023-06-23 VITALS — BP 122/72 | HR 91 | Temp 99.5°F | Ht 64.5 in | Wt 177.2 lb

## 2023-06-23 DIAGNOSIS — R42 Dizziness and giddiness: Secondary | ICD-10-CM

## 2023-06-23 DIAGNOSIS — B2709 Gammaherpesviral mononucleosis with other complications: Secondary | ICD-10-CM | POA: Insufficient documentation

## 2023-06-23 NOTE — Progress Notes (Signed)
 Patient ID: Jaime Chapman, female    DOB: [birth year], 63 y.o.   MRN: [id number]  This visit was conducted in person.  BP 122/72 (BP Location: Left Arm, Patient Position: Sitting, Cuff Size: Normal)   Pulse 91   Temp 99.5 F (37.5 C) (Temporal)   Ht 5' 4.5" (1.638 m)   Wt 177 lb 4 oz (80.4 kg)   SpO2 96%   BMI 29.96 kg/m    CC:  Chief Complaint  Patient presents with   Follow-up    06/20/23-Was there for 13 hours and never was seen by doctor    Subjective:   HPI: Jaime Chapman is a 63 y.o. female presenting on 06/23/2023 for Follow-up (06/20/23-Was there for 13 hours and never was seen by doctor)  Shelah Derry to ER on 06/20/2023 for new onset head spinning sensation, felt off balance. Continued all day. BP was normal. Next day awoke with feeling weak all over. Vertigo triggered randomly, no necessarily by movement.  No allergies, no cold symptoms, no fever, no neck stiffness. No vision change ut had cataract surgery recently  No CP, feel slight SOB.  Integrative MD starting her back on valacyclovir  for elevate EBV numbers   CMET: K slight low at 3.2 Mg: TSH and CBC: normal  CT head: normal CT scan    Started vyvanse 1 month ago. ON zepbound for 8 weeks  Wt Readings from Last 3 Encounters:  06/23/23 177 lb 4 oz (80.4 kg)  06/20/23 177 lb (80.3 kg)  12/22/22 189 lb 9.6 oz (86 kg)     Relevant past medical, surgical, family and social history reviewed and updated as indicated. Interim medical history since our last visit reviewed. Allergies and medications reviewed and updated. Outpatient Medications Prior to Visit  Medication Sig Dispense Refill   albuterol  (VENTOLIN  HFA) 108 (90 Base) MCG/ACT inhaler INHALE 2 PUFFS INTO THE LUNGS EVERY 4 HOURS AS NEEDED FOR WHEEIZNG 18 g 3   Cholecalciferol (VITAMIN D3) 1.25 MG (50000 UT) CAPS TAKE 1 CAPSULE BY MOUTH ONE TIME PER WEEK 12 capsule 0   estradiol  (ESTRACE ) 2 MG tablet Take 2 mg by mouth daily.     hydrochlorothiazide   (HYDRODIURIL ) 12.5 MG tablet TAKE 1 TABLET BY MOUTH EVERY DAY 90 tablet 3   lisdexamfetamine (VYVANSE) 20 MG capsule Take 20 mg by mouth daily.     losartan -hydrochlorothiazide  (HYZAAR) 50-12.5 MG tablet Take 1 tablet by mouth daily. 90 tablet 3   NP THYROID  15 MG tablet Take 45 mg by mouth daily.     ofloxacin (OCUFLOX) 0.3 % ophthalmic solution Place 1 drop into the left eye 4 (four) times daily.     prednisoLONE acetate (PRED FORTE) 1 % ophthalmic suspension Place 1 drop into the left eye daily.     progesterone  (PROMETRIUM ) 100 MG capsule Take 300 mg by mouth at bedtime.     tirzepatide (ZEPBOUND) 2.5 MG/0.5ML injection vial Inject 2.5 mg into the skin once a week.     NP THYROID  30 MG tablet Take 30 mg by mouth daily.     progesterone  (PROMETRIUM ) 200 MG capsule Take 200 mg by mouth daily.     No facility-administered medications prior to visit.     Per HPI unless specifically indicated in ROS section below Review of Systems  Constitutional:  Negative for fatigue and fever.  HENT:  Negative for congestion.   Eyes:  Negative for pain.  Respiratory:  Negative for cough and shortness of breath.  Cardiovascular:  Negative for chest pain, palpitations and leg swelling.  Gastrointestinal:  Negative for abdominal pain.  Genitourinary:  Negative for dysuria and vaginal bleeding.  Musculoskeletal:  Negative for back pain.  Neurological:  Negative for syncope, light-headedness and headaches.  Psychiatric/Behavioral:  Negative for dysphoric mood.    Objective:  BP 122/72 (BP Location: Left Arm, Patient Position: Sitting, Cuff Size: Normal)   Pulse 91   Temp 99.5 F (37.5 C) (Temporal)   Ht 5' 4.5" (1.638 m)   Wt 177 lb 4 oz (80.4 kg)   SpO2 96%   BMI 29.96 kg/m   Wt Readings from Last 3 Encounters:  06/23/23 177 lb 4 oz (80.4 kg)  06/20/23 177 lb (80.3 kg)  12/22/22 189 lb 9.6 oz (86 kg)      Physical Exam Constitutional:      General: She is not in acute distress.     Appearance: Normal appearance. She is well-developed. She is not ill-appearing or toxic-appearing.  HENT:     Head: Normocephalic.     Right Ear: Hearing, tympanic membrane, ear canal and external ear normal. Tympanic membrane is not erythematous, retracted or bulging.     Left Ear: Hearing, tympanic membrane, ear canal and external ear normal. Tympanic membrane is not erythematous, retracted or bulging.     Nose: No mucosal edema or rhinorrhea.     Right Sinus: No maxillary sinus tenderness or frontal sinus tenderness.     Left Sinus: No maxillary sinus tenderness or frontal sinus tenderness.     Mouth/Throat:     Pharynx: Uvula midline.  Eyes:     General: Lids are normal. Lids are everted, no foreign bodies appreciated.     Conjunctiva/sclera: Conjunctivae normal.     Pupils: Pupils are equal, round, and reactive to light.  Neck:     Thyroid : No thyroid  mass or thyromegaly.     Vascular: No carotid bruit.     Trachea: Trachea normal.  Cardiovascular:     Rate and Rhythm: Normal rate and regular rhythm.     Pulses: Normal pulses.     Heart sounds: Normal heart sounds, S1 normal and S2 normal. No murmur heard.    No friction rub. No gallop.  Pulmonary:     Effort: Pulmonary effort is normal. No tachypnea or respiratory distress.     Breath sounds: Normal breath sounds. No decreased breath sounds, wheezing, rhonchi or rales.  Abdominal:     General: Bowel sounds are normal.     Palpations: Abdomen is soft.     Tenderness: There is no abdominal tenderness.  Musculoskeletal:     Cervical back: Normal range of motion and neck supple.  Skin:    General: Skin is warm and dry.     Findings: No rash.  Neurological:     Mental Status: She is alert.  Psychiatric:        Mood and Affect: Mood is not anxious or depressed.        Speech: Speech normal.        Behavior: Behavior normal. Behavior is cooperative.        Thought Content: Thought content normal.        Judgment: Judgment  normal.       Results for orders placed or performed during the hospital encounter of 06/20/23  CBC with Differential   Collection Time: 06/20/23  5:18 PM  Result Value Ref Range   WBC 7.0 4.0 - 10.5 K/uL   RBC 4.96  3.87 - 5.11 MIL/uL   Hemoglobin 14.6 12.0 - 15.0 g/dL   HCT 96.0 45.4 - 09.8 %   MCV 86.5 80.0 - 100.0 fL   MCH 29.4 26.0 - 34.0 pg   MCHC 34.0 30.0 - 36.0 g/dL   RDW 11.9 14.7 - 82.9 %   Platelets 256 150 - 400 K/uL   nRBC 0.0 0.0 - 0.2 %   Neutrophils Relative % 70 %   Neutro Abs 4.9 1.7 - 7.7 K/uL   Lymphocytes Relative 23 %   Lymphs Abs 1.6 0.7 - 4.0 K/uL   Monocytes Relative 6 %   Monocytes Absolute 0.4 0.1 - 1.0 K/uL   Eosinophils Relative 1 %   Eosinophils Absolute 0.0 0.0 - 0.5 K/uL   Basophils Relative 0 %   Basophils Absolute 0.0 0.0 - 0.1 K/uL   Immature Granulocytes 0 %   Abs Immature Granulocytes 0.02 0.00 - 0.07 K/uL  Comprehensive metabolic panel   Collection Time: 06/20/23  5:18 PM  Result Value Ref Range   Sodium 139 135 - 145 mmol/L   Potassium 3.2 (L) 3.5 - 5.1 mmol/L   Chloride 105 98 - 111 mmol/L   CO2 24 22 - 32 mmol/L   Glucose, Bld 103 (H) 70 - 99 mg/dL   BUN 19 8 - 23 mg/dL   Creatinine, Ser 5.62 0.44 - 1.00 mg/dL   Calcium  9.6 8.9 - 10.3 mg/dL   Total Protein 7.2 6.5 - 8.1 g/dL   Albumin 4.2 3.5 - 5.0 g/dL   AST 18 15 - 41 U/L   ALT 13 0 - 44 U/L   Alkaline Phosphatase 33 (L) 38 - 126 U/L   Total Bilirubin 0.7 0.0 - 1.2 mg/dL   GFR, Estimated >13 >08 mL/min   Anion gap 10 5 - 15  Magnesium   Collection Time: 06/20/23  5:18 PM  Result Value Ref Range   Magnesium 2.1 1.7 - 2.4 mg/dL  TSH   Collection Time: 06/20/23  5:18 PM  Result Value Ref Range   TSH 1.327 0.350 - 4.500 uIU/mL    Assessment and Plan  There are no diagnoses linked to this encounter.  No follow-ups on file.   Herby Lolling, MD

## 2023-06-28 ENCOUNTER — Ambulatory Visit (INDEPENDENT_AMBULATORY_CARE_PROVIDER_SITE_OTHER): Admitting: Psychology

## 2023-06-28 DIAGNOSIS — F411 Generalized anxiety disorder: Secondary | ICD-10-CM | POA: Diagnosis not present

## 2023-06-28 DIAGNOSIS — F330 Major depressive disorder, recurrent, mild: Secondary | ICD-10-CM | POA: Diagnosis not present

## 2023-06-28 NOTE — Progress Notes (Signed)
 [facility] Counselor/Therapist Progress Note  Patient ID: Jaime Chapman, MRN: [id number],    Date: 06/28/2023  Time Spent: 9:01am-9:59am     Treatment Type: Individual Therapy  pt is seen for a virtual video visit via caregility.  Pt consents to telehealth session and is aware of limitations of telehealth visits. Pt joins from her car at home, reporting privacy, and counselor from her home office.    Reported Symptoms: pt reports fatigue- related to flare of chronic illness.  Some recent anxiety.  Mental Status Exam: Appearance:  Well Groomed     Behavior: Appropriate  Motor: Normal  Speech/Language:  Clear and Coherent  Affect: Appropriate  Mood: anxious  Thought process: normal  Thought content:   WNL  Sensory/Perceptual disturbances:   WNL  Orientation: oriented to person, place, time/date, and situation  Attention: Good  Concentration: Good  Memory: WNL  Fund of knowledge:  Good  Insight:   Good  Judgment:  Good  Impulse Control: Good   Risk Assessment: Danger to Self:  No Self-injurious Behavior: No Danger to Others: No Duty to Warn:no Physical Aggression / Violence:No  Access to Firearms a concern: No  Gang Involvement:No   Subjective: Counselor assessed pt current functioning per pt report. Processed w/pt emotions and fatigue.  Discussed barriers w/ flare in chronic illness and acknowledging ways to pace self and self compassion. Reiterated use of coping skills to assist in decompressing system from stress.    Pt affect wnl.  Pt reports has not been feeling well w/ recent vertigo and flare of Epstein-barr.  Pt discussed her extreme fatigue w/ flare.  Pt reports she pushed self too much w/ yard work yesterday and sudden exhaustion that set in and almost panic attack.  Pt discussed use of calming breath to manage.  Pt reports awareness of ways to pace at this time. Pt reports positive visit w/ her granddaughter over weekend.      Interventions:  Cognitive Behavioral Therapy and relaxation skills/supportive  Diagnosis:Generalized anxiety disorder  Mild episode of recurrent major depressive disorder (HCC)  Plan: Pt to f/u w/  biweekly counseling.  Pt to f/u w/ referral to psychiatrist.    Individualized Treatment Plan Strengths: seeking counseling  Supports: husband    Goal/Needs for Treatment:  In order of importance to patient 1) cope w/ depression and anxiety 2) --- 3) ---    Client Statement of Needs: "Seeking safe space to talk through things. Find out why I do the things I do.  I self sabotage myself a lot and want to change."    Treatment Level:outpatient counseling  Symptoms:depressed mood, low motivation/loss of interest, fatigue, negative self worth  Client Treatment Preferences:weekly counseling.  F/u w/ a psychiatrist.    Healthcare consumer's goal for treatment:   Counselor, [name], [facility] will support the patient's ability to achieve the goals identified. Cognitive Behavioral Therapy, Assertive Communication/Conflict Resolution Training, Relaxation Training, ACT, Humanistic and other evidenced-based practices will be used to promote progress towards healthy functioning.    Healthcare consumer will: Actively participate in therapy, working towards healthy functioning.     *Justification for Continuation/Discontinuation of Goal: R=Revised, O=Ongoing, A=Achieved, D=Discontinued   Goal 1) Increase awareness of thoughts and behaviors that impacting depression and anxiety and increase healthier thought patterns, increase positive self care and effective coping skills. Baseline date 07/19/22: Progress towards goal 0; How Often - Daily Target Date Goal Was reviewed Status Code Progress towards goal/Likert rating  07/19/23  This plan has been reviewed and created by the following participants:  This plan will be reviewed at least every 12 months. Date Behavioral Health Clinician Date  Guardian/Patient   07/19/22  [facility] Jaime Chapman [facility] 07/19/22 Verbal Consent Provided                                     [name] [facility]

## 2023-07-12 ENCOUNTER — Ambulatory Visit (INDEPENDENT_AMBULATORY_CARE_PROVIDER_SITE_OTHER): Admitting: Psychology

## 2023-07-12 DIAGNOSIS — F330 Major depressive disorder, recurrent, mild: Secondary | ICD-10-CM

## 2023-07-12 DIAGNOSIS — F411 Generalized anxiety disorder: Secondary | ICD-10-CM | POA: Diagnosis not present

## 2023-07-12 NOTE — Progress Notes (Signed)
 [facility] Counselor/Therapist Progress Note  Patient ID: Jaime Chapman, MRN: [id number],    Date: 07/12/2023  Time Spent: 9:01am-9:59am     Treatment Type: Individual Therapy  pt is seen for a virtual video visit via caregility.  Pt consents to telehealth session and is aware of limitations of telehealth visits. Pt joins from her car at home, reporting privacy, and counselor from her home office.    Reported Symptoms: pt reports continued fatigue- related to flare of chronic illness.  Pt reports hurt by husband's reaction to granddaughter.  Mental Status Exam: Appearance:  Well Groomed     Behavior: Appropriate  Motor: Normal  Speech/Language:  Clear and Coherent  Affect: Appropriate  Mood: sad  Thought process: normal  Thought content:   WNL  Sensory/Perceptual disturbances:   WNL  Orientation: oriented to person, place, time/date, and situation  Attention: Good  Concentration: Good  Memory: WNL  Fund of knowledge:  Good  Insight:   Good  Judgment:  Good  Impulse Control: Good   Risk Assessment: Danger to Self:  No Self-injurious Behavior: No Danger to Others: No Duty to Warn:no Physical Aggression / Violence:No  Access to Firearms a concern: No  Gang Involvement:No   Subjective: Counselor assessed pt current functioning per pt report. Processed w/pt continued impact of flare from [name]. Encouraged recognizing needs w/ her illness  and self compassion response.  Explored interactions w/ granddaughter and validated feeling hurt.  Pt affect wnl.  Pt reports vertigo improved but still struggling w/ persistent fatigue w/ flare of Epstein-barr.  Pt recognizing need to pace and impact having on keeping things up in house.  Pt reported that granddaughter hasn't called much from group home, meets for family therapy and plan to visit next weekend.  Pt reports when she did call to for mother's day and husband's birthday- husband was cold in interaction w/.   Pt upset and hurt that he is not working towards relationship w/ her granddaughter.    Interventions: Cognitive Behavioral Therapy and relaxation skills/supportive  Diagnosis:Generalized anxiety disorder  Mild episode of recurrent major depressive disorder (HCC)  Plan: Pt to f/u w/  biweekly counseling.  Pt to f/u w/ referral to psychiatrist.    Individualized Treatment Plan Strengths: seeking counseling  Supports: husband    Goal/Needs for Treatment:  In order of importance to patient 1) cope w/ depression and anxiety 2) --- 3) ---    Client Statement of Needs: "Seeking safe space to talk through things. Find out why I do the things I do.  I self sabotage myself a lot and want to change."    Treatment Level:outpatient counseling  Symptoms:depressed mood, low motivation/loss of interest, fatigue, negative self worth  Client Treatment Preferences:weekly counseling.  F/u w/ a psychiatrist.    Healthcare consumer's goal for treatment:   Counselor, [name], [facility] will support the patient's ability to achieve the goals identified. Cognitive Behavioral Therapy, Assertive Communication/Conflict Resolution Training, Relaxation Training, ACT, Humanistic and other evidenced-based practices will be used to promote progress towards healthy functioning.    Healthcare consumer will: Actively participate in therapy, working towards healthy functioning.     *Justification for Continuation/Discontinuation of Goal: R=Revised, O=Ongoing, A=Achieved, D=Discontinued   Goal 1) Increase awareness of thoughts and behaviors that impacting depression and anxiety and increase healthier thought patterns, increase positive self care and effective coping skills. Baseline date 07/19/22: Progress towards goal 0; How Often - Daily Target Date Goal Was reviewed Status Code Progress towards  goal/Likert rating  07/19/23                            This plan has been reviewed and created by the following  participants:  This plan will be reviewed at least every 12 months. Date Behavioral Health Clinician Date Guardian/Patient   07/19/22  [facility] [name] [facility] 07/19/22 Verbal Consent Provided                                 [name] [facility]

## 2023-07-19 DIAGNOSIS — R42 Dizziness and giddiness: Secondary | ICD-10-CM | POA: Insufficient documentation

## 2023-07-19 NOTE — Assessment & Plan Note (Signed)
 Acute  Possible BPPV vs med SE  CMET: K slight low at 3.2 Mg: TSH and CBC: normal  CT head: normal CT scan  Reviewed findings with patient in deatil. If not continuing to improve, she will return for further eval, possible ENT referral.

## 2023-07-26 ENCOUNTER — Encounter: Payer: Self-pay | Admitting: Psychology

## 2023-07-26 ENCOUNTER — Ambulatory Visit (INDEPENDENT_AMBULATORY_CARE_PROVIDER_SITE_OTHER): Admitting: Psychology

## 2023-07-26 DIAGNOSIS — F411 Generalized anxiety disorder: Secondary | ICD-10-CM

## 2023-07-26 DIAGNOSIS — F330 Major depressive disorder, recurrent, mild: Secondary | ICD-10-CM
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
## 2023-08-17 ENCOUNTER — Ambulatory Visit (INDEPENDENT_AMBULATORY_CARE_PROVIDER_SITE_OTHER): Admitting: Psychology

## 2023-08-17 DIAGNOSIS — F411 Generalized anxiety disorder: Secondary | ICD-10-CM | POA: Diagnosis not present

## 2023-08-17 DIAGNOSIS — F330 Major depressive disorder, recurrent, mild: Secondary | ICD-10-CM | POA: Diagnosis not present

## 2023-08-17 NOTE — Progress Notes (Signed)
 [facility] Counselor/Therapist Progress Note  Patient ID: Jaime Chapman, MRN: [id number],    Date: 08/17/2023  Time Spent: 9:01am-9:57am     Treatment Type: Individual Therapy  pt is seen for a virtual video visit via caregility.  Pt consents to telehealth session and is aware of limitations of telehealth visits. Pt joins from her car at home, reporting privacy, and counselor from her home office.    Reported Symptoms: pt reports improved w/ chronic illness flare subsiding.  Pt reports some anxiety about upcoming visit w/ granddaughter.  Mental Status Exam: Appearance:  Well Groomed     Behavior: Appropriate  Motor: Normal  Speech/Language:  Clear and Coherent  Affect: Appropriate  Mood: anxious  Thought process: normal  Thought content:   WNL  Sensory/Perceptual disturbances:   WNL  Orientation: oriented to person, place, time/date, and situation  Attention: Good  Concentration: Good  Memory: WNL  Fund of knowledge:  Good  Insight:   Good  Judgment:  Good  Impulse Control: Good   Risk Assessment: Danger to Self:  No Self-injurious Behavior: No Danger to Others: No Duty to Warn:no Physical Aggression / Violence:No  Access to Firearms a concern: No  Gang Involvement:No   Subjective: Counselor assessed pt current functioning per pt report. Processed w/pt improvements w/ [name] flare and positive impact on functioning.   Explored upcoming vacation and visit w/ granddaughter.  Validated and normalized worries and discussed support of group home/tx providers and focus on positive interactions.  Pt affect wnl.  Pt reports her flared has improved and has been able to do things she has been planning and less fatigue.  Pt reports on upcoming vacation w/ husband to beach and plan for outing/visit w/ granddaughter from group home.  Pt reports some anxiety that she might try to run or conflict when has her. Pt discussed positive interactions that have had and  support of group home providers.  Pt is looking forward to some relaxation time for self.   Interventions: Cognitive Behavioral Therapy and relaxation skills/supportive  Diagnosis:Generalized anxiety disorder  Mild episode of recurrent major depressive disorder (HCC)  Plan: Pt to f/u w/ counseling monthly.  Pt to f/u w/ PCP and [employer] as needed.       Individualized Treatment Plan Strengths: creative, enjoys gardening, enjoys her pets  Supports: close friends maintains contact with   Goal/Needs for Treatment:  In order of importance to patient 1) cope w/ depression and anxiety 2) --- 3) ---   Client Statement of Needs: Seeking safe space to talk through things. I need to be mentally stronger to deal with what is coming and insight to figure out what that looks like. Pt also would like to feel more ease despite stress.   Treatment Level:outpatient counseling  Symptoms:fatigue, negative self worth, anxiety, depressed moods  Client Treatment Preferences: counseling 1-2 times a month.   F/u w/ a psychiatrist.   Healthcare consumer's goal for treatment:  Counselor, [name], [facility] will support the patient's ability to achieve the goals identified. Cognitive Behavioral Therapy, Assertive Communication/Conflict Resolution Training, Relaxation Training, ACT, Humanistic and other evidenced-based practices will be used to promote progress towards healthy functioning.   Healthcare consumer will: Actively participate in therapy, working towards healthy functioning.    *Justification for Continuation/Discontinuation of Goal: R=Revised, O=Ongoing, A=Achieved, D=Discontinued  Goal 1) Increase awareness of thoughts and behaviors that impacting depression and anxiety and increase healthier thought patterns, increase positive self care and effective coping skills.  Baseline date 07/26/23: Progress towards goal 40; How Often - Daily Target Date Goal Was reviewed Status  Code Progress towards goal/Likert rating  07/25/24                Goal 2) Increase awareness of what's in her control, insight into her values and wants as confronts life stressors.   Baseline date 07/26/23: Progress towards goal 40; How Often - Daily Target Date Goal Was reviewed Status Code Progress towards goal/Likert rating  07/25/24           This plan has been reviewed and created by the following participants:  This plan will be reviewed at least every 12 months. Date Behavioral Health Clinician Date Guardian/Patient   07/26/23  [facility] [name] [facility] 07/26/23 Verbal Consent Provided and request for electronic signature sent.    08/17/23  Electronic signature completed by pt                [name] [facility]               [place], LCMHC

## 2023-09-07 ENCOUNTER — Ambulatory Visit (INDEPENDENT_AMBULATORY_CARE_PROVIDER_SITE_OTHER): Admitting: Psychology

## 2023-09-07 DIAGNOSIS — F330 Major depressive disorder, recurrent, mild: Secondary | ICD-10-CM | POA: Diagnosis not present

## 2023-09-07 DIAGNOSIS — F411 Generalized anxiety disorder: Secondary | ICD-10-CM | POA: Diagnosis not present

## 2023-09-07 NOTE — Progress Notes (Signed)
 [facility] Counselor/Therapist Progress Note  Patient ID: CELA NEWCOM, MRN: [id number],    Date: 09/07/2023  Time Spent: 9:02am-9:58am     Treatment Type: Individual Therapy  pt is seen for a virtual video visit via caregility.  Pt consents to telehealth session and is aware of limitations of telehealth visits. Pt joins from her home, reporting privacy, and counselor from her home office.    Reported Symptoms: pt reports some anxiety re: visits w/ granddaughter.  Pt reports able to get things done.   Mental Status Exam: Appearance:  Well Groomed     Behavior: Appropriate  Motor: Normal  Speech/Language:  Clear and Coherent  Affect: Appropriate  Mood: anxious  Thought process: normal  Thought content:   WNL  Sensory/Perceptual disturbances:   WNL  Orientation: oriented to person, place, time/date, and situation  Attention: Good  Concentration: Good  Memory: WNL  Fund of knowledge:  Good  Insight:   Good  Judgment:  Good  Impulse Control: Good   Risk Assessment: Danger to Self:  No Self-injurious Behavior: No Danger to Others: No Duty to Warn:no Physical Aggression / Violence:No  Access to Firearms a concern: No  Gang Involvement:No   Subjective: Counselor assessed pt current functioning per pt report. Processed w/pt positives and stressors.  Explored recent visit w/ granddaughter and worries for visits.  Discussed supports w/ group home and tx team.  Encouraged to advocate and seek guidance from.    Pt affect wnl.  Pt reports her health has continued to be improved from recent flare.  Pt reports enjoying getting things done and feels that current Vyvanse medication better for her ADHD.  Pt reports that she did have visit w/ her granddaughter for birthday but wasn't overnight as had run away form group home week before.  Pt discussed htat planning to go back in next couple weeks and incentive for overnight if follows group home rules.  Pt is worried that may  try to leave when she has her.  Pt also discussed concern for her manipulation in her tx.   Pt does feel good support from her tx team and plans for further communication w/ her granddaughter's therapist.    Interventions: Cognitive Behavioral Therapy and relaxation skills/supportive  Diagnosis:Generalized anxiety disorder  Mild episode of recurrent major depressive disorder (HCC)  Plan: Pt to f/u w/ counseling monthly.  Pt to f/u w/ PCP and [employer] as needed.       Individualized Treatment Plan Strengths: creative, enjoys gardening, enjoys her pets  Supports: close friends maintains contact with   Goal/Needs for Treatment:  In order of importance to patient 1) cope w/ depression and anxiety 2) --- 3) ---   Client Statement of Needs: Seeking safe space to talk through things. I need to be mentally stronger to deal with what is coming and insight to figure out what that looks like. Pt also would like to feel more ease despite stress.   Treatment Level:outpatient counseling  Symptoms:fatigue, negative self worth, anxiety, depressed moods  Client Treatment Preferences: counseling 1-2 times a month.   F/u w/ a psychiatrist.   Healthcare consumer's goal for treatment:  Counselor, Damien Herald, [facility] will support the patient's ability to achieve the goals identified. Cognitive Behavioral Therapy, Assertive Communication/Conflict Resolution Training, Relaxation Training, ACT, Humanistic and other evidenced-based practices will be used to promote progress towards healthy functioning.   Healthcare consumer will: Actively participate in therapy, working towards healthy functioning.    *Justification for  Continuation/Discontinuation of Goal: R=Revised, O=Ongoing, A=Achieved, D=Discontinued  Goal 1) Increase awareness of thoughts and behaviors that impacting depression and anxiety and increase healthier thought patterns, increase positive self care and effective  coping skills. Baseline date 07/26/23: Progress towards goal 40; How Often - Daily Target Date Goal Was reviewed Status Code Progress towards goal/Likert rating  07/25/24                Goal 2) Increase awareness of what's in her control, insight into her values and wants as confronts life stressors.   Baseline date 07/26/23: Progress towards goal 40; How Often - Daily Target Date Goal Was reviewed Status Code Progress towards goal/Likert rating  07/25/24           This plan has been reviewed and created by the following participants:  This plan will be reviewed at least every 12 months. Date Behavioral Health Clinician Date Guardian/Patient   07/26/23  [facility] 07/26/23 Verbal Consent Provided and request for electronic signature sent.    08/17/23  Electronic signature completed by pt                      [name], LCMHC

## 2023-09-28 ENCOUNTER — Ambulatory Visit: Admitting: Psychology

## 2023-09-28 DIAGNOSIS — F330 Major depressive disorder, recurrent, mild: Secondary | ICD-10-CM | POA: Diagnosis not present

## 2023-09-28 DIAGNOSIS — F411 Generalized anxiety disorder: Secondary | ICD-10-CM
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
## 2023-10-20 ENCOUNTER — Ambulatory Visit (INDEPENDENT_AMBULATORY_CARE_PROVIDER_SITE_OTHER): Admitting: Psychology

## 2023-10-20 DIAGNOSIS — F411 Generalized anxiety disorder: Secondary | ICD-10-CM | POA: Diagnosis not present

## 2023-10-20 DIAGNOSIS — F330 Major depressive disorder, recurrent, mild: Secondary | ICD-10-CM

## 2023-10-20 NOTE — Progress Notes (Signed)
 [facility] Counselor/Therapist Progress Note  Patient ID: Jaime Chapman, MRN: [id number],    Date: 10/20/2023  Time Spent: 8:01am-8:56am     Treatment Type: Individual Therapy  pt is seen for a virtual video visit via caregility.  Pt consents to telehealth session and is aware of limitations of telehealth visits. Pt joins from her parked car, reporting privacy, and counselor from her home office.    Reported Symptoms: pt reports some anxiety about granddaughter and mother.  Mental Status Exam: Appearance:  Well Groomed     Behavior: Appropriate  Motor: Normal  Speech/Language:  Clear and Coherent  Affect: Appropriate  Mood: anxious- some worry  Thought process: normal  Thought content:   WNL  Sensory/Perceptual disturbances:   WNL  Orientation: oriented to person, place, time/date, and situation  Attention: Good  Concentration: Good  Memory: WNL  Fund of knowledge:  Good  Insight:   Good  Judgment:  Good  Impulse Control: Good   Risk Assessment: Danger to Self:  No Self-injurious Behavior: No Danger to Others: No Duty to Warn:no Physical Aggression / Violence:No  Access to Firearms a concern: No  Gang Involvement:No   Subjective: Counselor assessed pt current functioning per pt report. Processed w/pt positives and stressors.  Explored upcoming visit w/ granddaughter and recent interactions.  Discussed concerns for granddaughter's mental and mother's health. Encouraged pt focus on what's in her control and ways to support and boundaries to maintain.  Pt affect wnl.  Pt reports mood has been good and better energy.  Pt reports on her way for day visit w/ granddaughter.  Pt reports that will be meeting w/ counselor re: pt and her concerns.  Pt reports seems to be able to do what expected and then continued pattern of dysfunction.  Pt concerned that not making sustainable progress and how things will be when returns home.  Pt also reports concerns for her  husband health and her mom and struggles w/ decision making for finances.  Pt discussed ways supporting and to not self blame.    Interventions: Cognitive Behavioral Therapy and supportive  Diagnosis:Generalized anxiety disorder  Mild episode of recurrent major depressive disorder (HCC)  Plan: Pt to f/u w/ counseling monthly.  Pt to f/u w/ PCP and [employer] as needed.       Individualized Treatment Plan Strengths: creative, enjoys gardening, enjoys her pets  Supports: close friends maintains contact with   Goal/Needs for Treatment:  In order of importance to patient 1) cope w/ depression and anxiety 2) --- 3) ---   Client Statement of Needs: Seeking safe space to talk through things. I need to be mentally stronger to deal with what is coming and insight to figure out what that looks like. Pt also would like to feel more ease despite stress.   Treatment Level:outpatient counseling  Symptoms:fatigue, negative self worth, anxiety, depressed moods  Client Treatment Preferences: counseling 1-2 times a month.   F/u w/ a psychiatrist.   Healthcare consumer's goal for treatment:  Counselor, Damien Herald, [facility] will support the patient's ability to achieve the goals identified. Cognitive Behavioral Therapy, Assertive Communication/Conflict Resolution Training, Relaxation Training, ACT, Humanistic and other evidenced-based practices will be used to promote progress towards healthy functioning.   Healthcare consumer will: Actively participate in therapy, working towards healthy functioning.    *Justification for Continuation/Discontinuation of Goal: R=Revised, O=Ongoing, A=Achieved, D=Discontinued  Goal 1) Increase awareness of thoughts and behaviors that impacting depression and anxiety and increase healthier thought  patterns, increase positive self care and effective coping skills. Baseline date 07/26/23: Progress towards goal 40; How Often - Daily Target Date Goal  Was reviewed Status Code Progress towards goal/Likert rating  07/25/24                Goal 2) Increase awareness of what's in her control, insight into her values and wants as confronts life stressors.   Baseline date 07/26/23: Progress towards goal 40; How Often - Daily Target Date Goal Was reviewed Status Code Progress towards goal/Likert rating  07/25/24           This plan has been reviewed and created by the following participants:  This plan will be reviewed at least every 12 months. Date Behavioral Health Clinician Date Guardian/Patient   07/26/23  [facility] Barbarann [facility] 07/26/23 Verbal Consent Provided and request for electronic signature sent.    08/17/23  Electronic signature completed by pt                     [name], LCMHC

## 2023-10-28 ENCOUNTER — Other Ambulatory Visit: Payer: Self-pay | Admitting: *Deleted

## 2023-11-09 ENCOUNTER — Ambulatory Visit (INDEPENDENT_AMBULATORY_CARE_PROVIDER_SITE_OTHER): Admitting: Psychology

## 2023-11-09 DIAGNOSIS — F330 Major depressive disorder, recurrent, mild: Secondary | ICD-10-CM

## 2023-11-09 DIAGNOSIS — F411 Generalized anxiety disorder: Secondary | ICD-10-CM

## 2023-11-09 NOTE — Progress Notes (Signed)
 [facility] Counselor/Therapist Progress Note  Patient ID: Jaime Chapman, MRN: [id number],    Date: 11/09/2023  Time Spent: 9:01am-9:59am     Treatment Type: Individual Therapy  pt is seen for a virtual video visit via caregility.  Pt consents to telehealth session and is aware of limitations of telehealth visits. Pt joins from her parked car, reporting privacy, and counselor from her home office.    Reported Symptoms: pt reports increased feeling on edge and lower motivation.   Mental Status Exam: Appearance:  Well Groomed     Behavior: Appropriate  Motor: Normal  Speech/Language:  Clear and Coherent  Affect: Appropriate  Mood: anxious  Thought process: normal  Thought content:   WNL  Sensory/Perceptual disturbances:   WNL  Orientation: oriented to person, place, time/date, and situation  Attention: Good  Concentration: Good  Memory: WNL  Fund of knowledge:  Good  Insight:   Good  Judgment:  Good  Impulse Control: Good   Risk Assessment: Danger to Self:  No Self-injurious Behavior: No Danger to Others: No Duty to Warn:no Physical Aggression / Violence:No  Access to Firearms a concern: No  Gang Involvement:No   Subjective: Counselor assessed pt current functioning per pt report. Processed w/pt positives, stressors and anxiety.  Assisted pt in identifying potential contributing factors to increased anxiety. Explored coping skills and ways to engage to be present, movement, plans for positive engagement.  Pt affect wnl.  Pt reports she has been feeling more on edge in past couple weeks.  Pt reports also struggle w/ motivation to do things.  Pt was able ot identify stressors- road trip w mom and difficult interactions w/ mom's negativity.  Pt reports worry for granddaughter and stressors of decisions- stealing, drinking, fight at school.  Pt also reports worry for her grandson and work stressor recent for him. Pt also recognized that shifting to fall is  difficult anticipates negative of winter for herself.  Pt was able ot reflect on ways to focus on her coping and self care and ways to be present and engage in enjoying things.  Pt discussed walking and to plan visit to mountains has discussed w/ husband.    Interventions: Cognitive Behavioral Therapy and supportive  Diagnosis:Generalized anxiety disorder  Mild episode of recurrent major depressive disorder (HCC)  Plan: Pt to f/u w/ in 2 weeks.  Pt to f/u w/ PCP and [employer] as needed.       Individualized Treatment Plan Strengths: creative, enjoys gardening, enjoys her pets  Supports: close friends maintains contact with   Goal/Needs for Treatment:  In order of importance to patient 1) cope w/ depression and anxiety 2) --- 3) ---   Client Statement of Needs: Seeking safe space to talk through things. I need to be mentally stronger to deal with what is coming and insight to figure out what that looks like. Pt also would like to feel more ease despite stress.   Treatment Level:outpatient counseling  Symptoms:fatigue, negative self worth, anxiety, depressed moods  Client Treatment Preferences: counseling 1-2 times a month.   F/u w/ a psychiatrist.   Healthcare consumer's goal for treatment:  Counselor, Damien Herald, [facility] will support the patient's ability to achieve the goals identified. Cognitive Behavioral Therapy, Assertive Communication/Conflict Resolution Training, Relaxation Training, ACT, Humanistic and other evidenced-based practices will be used to promote progress towards healthy functioning.   Healthcare consumer will: Actively participate in therapy, working towards healthy functioning.    *Justification for Continuation/Discontinuation of Goal:  R=Revised, O=Ongoing, A=Achieved, D=Discontinued  Goal 1) Increase awareness of thoughts and behaviors that impacting depression and anxiety and increase healthier thought patterns, increase positive  self care and effective coping skills. Baseline date 07/26/23: Progress towards goal 40; How Often - Daily Target Date Goal Was reviewed Status Code Progress towards goal/Likert rating  07/25/24                Goal 2) Increase awareness of what's in her control, insight into her values and wants as confronts life stressors.   Baseline date 07/26/23: Progress towards goal 40; How Often - Daily Target Date Goal Was reviewed Status Code Progress towards goal/Likert rating  07/25/24           This plan has been reviewed and created by the following participants:  This plan will be reviewed at least every 12 months. Date Behavioral Health Clinician Date Guardian/Patient   07/26/23  [facility] Jaime [facility] 07/26/23 Verbal Consent Provided and request for electronic signature sent.    08/17/23  Electronic signature completed by pt                      Jaime Chapman, LCMHC

## 2023-11-22 ENCOUNTER — Other Ambulatory Visit: Payer: Self-pay | Admitting: Family Medicine

## 2023-11-22 ENCOUNTER — Telehealth: Payer: Self-pay

## 2023-11-22 DIAGNOSIS — R7303 Prediabetes: Secondary | ICD-10-CM

## 2023-11-22 DIAGNOSIS — E782 Mixed hyperlipidemia: Secondary | ICD-10-CM

## 2023-11-22 DIAGNOSIS — E039 Hypothyroidism, unspecified: Secondary | ICD-10-CM

## 2023-11-22 DIAGNOSIS — E559 Vitamin D deficiency, unspecified: Secondary | ICD-10-CM

## 2023-11-22 NOTE — Telephone Encounter (Signed)
 Please call and schedule fasting lab appointment prior to CPE on 12/23/2023.  Future lab orders in Epic.

## 2023-11-22 NOTE — Telephone Encounter (Signed)
 Copied from CRM [id number]. Topic: Clinical - Request for Lab/Test Order >> Nov 22, 2023 10:38 AM Franky GRADE wrote: Reason for CRM: Patient is calling because she is scheduled for her annual physical on 12/23/2023 and would like to do the lab work prior. No active orders.

## 2023-11-23 ENCOUNTER — Ambulatory Visit (INDEPENDENT_AMBULATORY_CARE_PROVIDER_SITE_OTHER): Admitting: Psychology

## 2023-11-23 DIAGNOSIS — F330 Major depressive disorder, recurrent, mild: Secondary | ICD-10-CM

## 2023-11-23 DIAGNOSIS — F411 Generalized anxiety disorder: Secondary | ICD-10-CM | POA: Diagnosis not present

## 2023-11-23 NOTE — Progress Notes (Unsigned)
 [facility] Counselor/Therapist Progress Note  Patient ID: Jaime Chapman, MRN: [id number],    Date: 11/23/2023  Time Spent: 9:01am-9:59am     Treatment Type: Individual Therapy  pt is seen for a virtual video visit via caregility.  Pt consents to telehealth session and is aware of limitations of telehealth visits. Pt joins from her parked car, reporting privacy, and counselor from her home office.    Reported Symptoms: pt reports worry for husband's health, granddaughter   Mental Status Exam: Appearance:  Well Groomed     Behavior: Appropriate  Motor: Normal  Speech/Language:  Clear and Coherent  Affect: Appropriate  Mood: anxious  Thought process: normal  Thought content:   WNL  Sensory/Perceptual disturbances:   WNL  Orientation: oriented to person, place, time/date, and situation  Attention: Good  Concentration: Good  Memory: WNL  Fund of knowledge:  Good  Insight:   Good  Judgment:  Good  Impulse Control: Good   Risk Assessment: Danger to Self:  No Self-injurious Behavior: No Danger to Others: No Duty to Warn:no Physical Aggression / Violence:No  Access to Firearms a concern: No  Gang Involvement:No   Subjective: Counselor assessed pt current functioning per pt report. Processed w/pt positives, stressors and anxiety.  Assisted pt in identifying potential contributing factors to increased anxiety. Explored coping skills and ways to engage to be present, movement, plans for positive engagement.  Pt affect wnl.  Pt reports she has been feeling more on edge in past couple weeks.  Pt reports also struggle w/ motivation to do things.  Pt was able ot identify stressors- road trip w mom and difficult interactions w/ mom's negativity.  Pt reports worry for granddaughter and stressors of decisions- stealing, drinking, fight at school.  Pt also reports worry for her grandson and work stressor recent for him. Pt also recognized that shifting to fall is difficult  anticipates negative of winter for herself.  Pt was able ot reflect on ways to focus on her coping and self care and ways to be present and engage in enjoying things.  Pt discussed walking and to plan visit to mountains has discussed w/ husband.    Interventions: Cognitive Behavioral Therapy and supportive  Diagnosis:Generalized anxiety disorder  Mild episode of recurrent major depressive disorder  Plan: Pt to f/u w/ in 2 weeks.  Pt to f/u w/ PCP and [employer] as needed.       Individualized Treatment Plan Strengths: creative, enjoys gardening, enjoys her pets  Supports: close friends maintains contact with   Goal/Needs for Treatment:  In order of importance to patient 1) cope w/ depression and anxiety 2) --- 3) ---   Client Statement of Needs: Seeking safe space to talk through things. I need to be mentally stronger to deal with what is coming and insight to figure out what that looks like. Pt also would like to feel more ease despite stress.   Treatment Level:outpatient counseling  Symptoms:fatigue, negative self worth, anxiety, depressed moods  Client Treatment Preferences: counseling 1-2 times a month.   F/u w/ a psychiatrist.   Healthcare consumer's goal for treatment:  Counselor, Damien Herald, [facility] will support the patient's ability to achieve the goals identified. Cognitive Behavioral Therapy, Assertive Communication/Conflict Resolution Training, Relaxation Training, ACT, Humanistic and other evidenced-based practices will be used to promote progress towards healthy functioning.   Healthcare consumer will: Actively participate in therapy, working towards healthy functioning.    *Justification for Continuation/Discontinuation of Goal: R=Revised, O=Ongoing, A=Achieved,  D=Discontinued  Goal 1) Increase awareness of thoughts and behaviors that impacting depression and anxiety and increase healthier thought patterns, increase positive self care and  effective coping skills. Baseline date 07/26/23: Progress towards goal 40; How Often - Daily Target Date Goal Was reviewed Status Code Progress towards goal/Likert rating  07/25/24                Goal 2) Increase awareness of what's in her control, insight into her values and wants as confronts life stressors.   Baseline date 07/26/23: Progress towards goal 40; How Often - Daily Target Date Goal Was reviewed Status Code Progress towards goal/Likert rating  07/25/24           This plan has been reviewed and created by the following participants:  This plan will be reviewed at least every 12 months. Date Behavioral Health Clinician Date Guardian/Patient   07/26/23  [facility] [name] 07/26/23 Verbal Consent Provided and request for electronic signature sent.    08/17/23  Electronic signature completed by pt                    [name], LCMHC

## 2023-12-14 ENCOUNTER — Ambulatory Visit (INDEPENDENT_AMBULATORY_CARE_PROVIDER_SITE_OTHER): Admitting: Psychology

## 2023-12-14 DIAGNOSIS — F330 Major depressive disorder, recurrent, mild: Secondary | ICD-10-CM | POA: Diagnosis not present

## 2023-12-14 DIAGNOSIS — F411 Generalized anxiety disorder: Secondary | ICD-10-CM | POA: Diagnosis not present

## 2023-12-14 NOTE — Progress Notes (Signed)
 [facility] Counselor/Therapist Progress Note  Patient ID: Jaime Chapman, MRN: [id number],    Date: 12/14/2023  Time Spent: 9:01am-9:52am     Treatment Type: Individual Therapy  pt is seen for a virtual video visit via caregility.  Pt consents to telehealth session and is aware of limitations of telehealth visits. Pt joins from her home, reporting privacy, and counselor from her home office.    Reported Symptoms: pt reports fatigue w/ attending to others.  Pt reports some positive self care steps.    Mental Status Exam: Appearance:  Well Groomed     Behavior: Appropriate  Motor: Normal  Speech/Language:  Clear and Coherent  Affect: Appropriate  Mood: anxious and fatigue  Thought process: normal  Thought content:   WNL  Sensory/Perceptual disturbances:   WNL  Orientation: oriented to person, place, time/date, and situation  Attention: Good  Concentration: Good  Memory: WNL  Fund of knowledge:  Good  Insight:   Good  Judgment:  Good  Impulse Control: Good   Risk Assessment: Danger to Self:  No Self-injurious Behavior: No Danger to Others: No Duty to Warn:no Physical Aggression / Violence:No  Access to Firearms a concern: No  Gang Involvement:No   Subjective: Counselor assessed pt current functioning per pt report. Processed w/pt stressors and fatigue.  Validated and normalized emotions w/ attending to others. Explored coping skills and how being intentional for self care strategies.  Reflected positives of self care.  Pt affect wnl.  Pt reports she is feeling fatigued and aware that has been taking care of others- grand kids, mom and husband.  Pt reports on worry for granddaughter and lack of effort putting her back in progress.  Pt reports positive of grandson starting school next week.  Pt reports in ED w/ mom couple nights ago. Pt discussed awareness of mom's aging and having to assist more and how draining this is for her. Pt reports positive of mountain  trip w/ husband and planning for crocheting today and how that is therapeutic for her. .  Interventions: Cognitive Behavioral Therapy and supportive and relaxation strategies.   Diagnosis:Generalized anxiety disorder  Mild episode of recurrent major depressive disorder  Plan: Pt to f/u w/ in 3 weeks.  Pt to f/u w/ PCP and [employer] as needed.       Individualized Treatment Plan Strengths: creative, enjoys gardening, enjoys her pets  Supports: close friends maintains contact with   Goal/Needs for Treatment:  In order of importance to patient 1) cope w/ depression and anxiety 2) --- 3) ---   Client Statement of Needs: Seeking safe space to talk through things. I need to be mentally stronger to deal with what is coming and insight to figure out what that looks like. Pt also would like to feel more ease despite stress.   Treatment Level:outpatient counseling  Symptoms:fatigue, negative self worth, anxiety, depressed moods  Client Treatment Preferences: counseling 1-2 times a month.   F/u w/ a psychiatrist.   Healthcare consumer's goal for treatment:  Counselor, Damien Herald, [facility] will support the patient's ability to achieve the goals identified. Cognitive Behavioral Therapy, Assertive Communication/Conflict Resolution Training, Relaxation Training, ACT, Humanistic and other evidenced-based practices will be used to promote progress towards healthy functioning.   Healthcare consumer will: Actively participate in therapy, working towards healthy functioning.    *Justification for Continuation/Discontinuation of Goal: R=Revised, O=Ongoing, A=Achieved, D=Discontinued  Goal 1) Increase awareness of thoughts and behaviors that impacting depression and anxiety and increase healthier  thought patterns, increase positive self care and effective coping skills. Baseline date 07/26/23: Progress towards goal 40; How Often - Daily Target Date Goal Was reviewed Status Code  Progress towards goal/Likert rating  07/25/24                Goal 2) Increase awareness of what's in her control, insight into her values and wants as confronts life stressors.   Baseline date 07/26/23: Progress towards goal 40; How Often - Daily Target Date Goal Was reviewed Status Code Progress towards goal/Likert rating  07/25/24           This plan has been reviewed and created by the following participants:  This plan will be reviewed at least every 12 months. Date Behavioral Health Clinician Date Guardian/Patient   07/26/23  [facility] Barbarann [facility] 07/26/23 Verbal Consent Provided and request for electronic signature sent.    08/17/23  Electronic signature completed by pt                      [name], LCMHC

## 2023-12-16 ENCOUNTER — Other Ambulatory Visit

## 2023-12-16 ENCOUNTER — Ambulatory Visit: Payer: Self-pay | Admitting: Family Medicine

## 2023-12-16 DIAGNOSIS — E559 Vitamin D deficiency, unspecified: Secondary | ICD-10-CM

## 2023-12-16 DIAGNOSIS — E039 Hypothyroidism, unspecified: Secondary | ICD-10-CM

## 2023-12-16 DIAGNOSIS — R7303 Prediabetes: Secondary | ICD-10-CM

## 2023-12-16 DIAGNOSIS — E782 Mixed hyperlipidemia: Secondary | ICD-10-CM

## 2023-12-16 LAB — VITAMIN D 25 HYDROXY (VIT D DEFICIENCY, FRACTURES): VITD: 33.9 ng/mL (ref 30.00–100.00)

## 2023-12-16 LAB — LIPID PANEL
Cholesterol: 203 mg/dL — ABNORMAL HIGH (ref 0–200)
HDL: 49.7 mg/dL (ref >39.00)
LDL Cholesterol: 127 mg/dL — ABNORMAL HIGH (ref 0–99)
NonHDL: 153.58
Total CHOL/HDL Ratio: 4
Triglycerides: 134 mg/dL (ref 0.0–149.0)
VLDL: 26.8 mg/dL (ref 0.0–40.0)

## 2023-12-16 LAB — COMPREHENSIVE METABOLIC PANEL WITH GFR
ALT: 9 U/L (ref 0–35)
AST: 10 U/L (ref 0–37)
Albumin: 4.3 g/dL (ref 3.5–5.2)
Alkaline Phosphatase: 37 U/L — ABNORMAL LOW (ref 39–117)
BUN: 17 mg/dL (ref 6–23)
CO2: 30 meq/L (ref 19–32)
Calcium: 9.6 mg/dL (ref 8.4–10.5)
Chloride: 103 meq/L (ref 96–112)
Creatinine, Ser: 0.94 mg/dL (ref 0.40–1.20)
GFR: 65.02 mL/min (ref >60.00)
Glucose, Bld: 82 mg/dL (ref 70–99)
Potassium: 3.7 meq/L (ref 3.5–5.1)
Sodium: 141 meq/L (ref 135–145)
Total Bilirubin: 0.6 mg/dL (ref 0.2–1.2)
Total Protein: 6.6 g/dL (ref 6.0–8.3)

## 2023-12-16 LAB — HEMOGLOBIN A1C: Hgb A1c MFr Bld: 5.3 % (ref 4.6–6.5)

## 2023-12-16 LAB — T3, FREE: T3, Free: 2.9 pg/mL (ref 2.3–4.2)

## 2023-12-16 LAB — TSH: TSH: 1.11 u[IU]/mL (ref 0.35–5.50)

## 2023-12-16 LAB — T4, FREE: Free T4: 0.72 ng/dL (ref 0.60–1.60)

## 2023-12-16 NOTE — Progress Notes (Signed)
 No critical labs need to be addressed urgently. We will discuss labs in detail at upcoming office visit.

## 2023-12-23 ENCOUNTER — Ambulatory Visit (INDEPENDENT_AMBULATORY_CARE_PROVIDER_SITE_OTHER): Admitting: Family Medicine

## 2023-12-23 ENCOUNTER — Encounter: Payer: Self-pay | Admitting: Family Medicine

## 2023-12-23 VITALS — BP 98/62 | HR 97 | Temp 97.8°F | Ht 64.5 in | Wt 156.0 lb

## 2023-12-23 DIAGNOSIS — E559 Vitamin D deficiency, unspecified: Secondary | ICD-10-CM

## 2023-12-23 DIAGNOSIS — Z1211 Encounter for screening for malignant neoplasm of colon: Secondary | ICD-10-CM

## 2023-12-23 DIAGNOSIS — R7303 Prediabetes: Secondary | ICD-10-CM | POA: Diagnosis not present

## 2023-12-23 DIAGNOSIS — Z0000 Encounter for general adult medical examination without abnormal findings: Secondary | ICD-10-CM

## 2023-12-23 DIAGNOSIS — E039 Hypothyroidism, unspecified: Secondary | ICD-10-CM

## 2023-12-23 DIAGNOSIS — I10 Essential (primary) hypertension: Secondary | ICD-10-CM

## 2023-12-23 DIAGNOSIS — J4540 Moderate persistent asthma, uncomplicated: Secondary | ICD-10-CM

## 2023-12-23 DIAGNOSIS — Z23 Encounter for immunization: Secondary | ICD-10-CM | POA: Diagnosis not present

## 2023-12-23 DIAGNOSIS — E7800 Pure hypercholesterolemia, unspecified: Secondary | ICD-10-CM

## 2023-12-23 NOTE — Assessment & Plan Note (Signed)
Chronic, TSH controlled on NP thyroid  management by alternative med.

## 2023-12-23 NOTE — Progress Notes (Signed)
 Patient ID: Jaime Chapman, female    DOB: 1960/05/13, 63 y.o.   MRN: [id number]  This visit was conducted in person.  BP 98/62   Pulse 97   Temp 97.8 F (36.6 C) (Temporal)   Ht 5' 4.5 (1.638 m)   Wt 156 lb (70.8 kg)   SpO2 96%   BMI 26.36 kg/m    CC:  Chief Complaint  Patient presents with   Annual Exam    Would like to discuss ongoing elevated ferritin levels. Recently had this checked through [facility] bariatric clinic. Labs in care everywhere.     Subjective:   HPI: Jaime Chapman is a 63 y.o. female presenting on 12/23/2023 for Annual Exam (Would like to discuss ongoing elevated ferritin levels. Recently had this checked through [facility] bariatric clinic. Labs in care everywhere. ) The patient presents for  complete physical and review of chronic health problems. He/She also has the following acute concerns today:   She has been losing weight with Zepbound. S/P gastric bypass.  Hypertension:  Good control.. with weight loss on losartan  hydrochlorothiazide  50/12.5 mg daily and  extra 12.5 HCTZ BP Readings from Last 3 Encounters:  12/23/23 98/62  06/23/23 122/72  06/20/23 122/80  Using medication without problems or lightheadedness:  none Chest pain with exertion:none Edema: none Short of breath: OSA  Average home BPs: 110/60 Other issues:  Seeing bariatric MD for weight mangement. Wt Readings from Last 3 Encounters:  12/23/23 156 lb (70.8 kg)  06/23/23 177 lb 4 oz (80.4 kg)  06/20/23 177 lb (80.3 kg)     Hypothyroidism:  Now on NP thyroid .. treated by Dr. Orlie. stable Lab Results  Component Value Date   TSH 1.11 12/16/2023   Elevated Cholesterol: Tolerable control. Has recently starting walking wit  dog. Lab Results  Component Value Date   CHOL 203 (H) 12/16/2023   HDL 49.70 12/16/2023   LDLCALC 127 (H) 12/16/2023   LDLDIRECT 139.0 09/09/2020   TRIG 134.0 12/16/2023   CHOLHDL 4 12/16/2023   The 10-year ASCVD risk score (Arnett DK, et al.,  2019) is: 3.4%   Values used to calculate the score:     Age: 56 years     Clincally relevant sex: Female     Is Non-Hispanic African American: No     Diabetic: No     Tobacco smoker: No     Systolic Blood Pressure: 98 mmHg     Is BP treated: Yes     HDL Cholesterol: 49.7 mg/dL     Total Cholesterol: 203 mg/dL Using medications without problems: Muscle aches:  Diet compliance: moderate... working on Field seismologist. Exercise: minimal Other complaints: Seeing Dr. Dela for bariatric weight loss center.   Prediabetes stable control with diet Lab Results  Component Value Date   HGBA1C 5.3 12/16/2023      Fatty liver disease : Transaminases in the normal range. Wt Readings from Last 3 Encounters:  12/23/23 156 lb (70.8 kg)  06/23/23 177 lb 4 oz (80.4 kg)  06/20/23 177 lb (80.3 kg)    MDD/GAD/Fibromyalgia  Stable overall, mood is improved. Still chronic body pain.  Granddaughter now in group home. Flowsheet Row Office Visit from 12/23/2023 in [facility] [place]  PHQ-2 Total Score 0     Relevant past medical, surgical, family and social history reviewed and updated as indicated. Interim medical history since our last visit reviewed. Allergies and medications reviewed and updated. Outpatient Medications Prior to Visit  Medication Sig Dispense Refill   albuterol  (VENTOLIN  HFA) 108 (90 Base) MCG/ACT inhaler INHALE 2 PUFFS INTO THE LUNGS EVERY 4 HOURS AS NEEDED FOR WHEEIZNG 18 g 3   Cholecalciferol (VITAMIN D3) 1.25 MG (50000 UT) CAPS TAKE 1 CAPSULE BY MOUTH ONE TIME PER WEEK 12 capsule 0   estradiol  (ESTRACE ) 2 MG tablet Take 2 mg by mouth daily.     lisdexamfetamine (VYVANSE) 20 MG capsule Take 20 mg by mouth daily.     losartan -hydrochlorothiazide  (HYZAAR) 50-12.5 MG tablet Take 1 tablet by mouth daily. 90 tablet 3   NP THYROID  15 MG tablet Take 45 mg by mouth daily.     progesterone  (PROMETRIUM ) 100 MG capsule Take 300 mg by mouth at bedtime.      tirzepatide (ZEPBOUND) 2.5 MG/0.5ML injection vial Inject 2.5 mg into the skin once a week.     hydrochlorothiazide  (HYDRODIURIL ) 12.5 MG tablet TAKE 1 TABLET BY MOUTH EVERY DAY 90 tablet 3   No facility-administered medications prior to visit.     Per HPI unless specifically indicated in ROS section below Review of Systems  Constitutional:  Negative for fatigue and fever.  HENT:  Negative for congestion.   Eyes:  Negative for pain.  Respiratory:  Negative for cough and shortness of breath.   Cardiovascular:  Negative for chest pain, palpitations and leg swelling.  Gastrointestinal:  Negative for abdominal pain.  Genitourinary:  Negative for dysuria and vaginal bleeding.  Musculoskeletal:  Negative for back pain.  Neurological:  Negative for syncope, light-headedness and headaches.  Psychiatric/Behavioral:  Negative for dysphoric mood.    Objective:  BP 98/62   Pulse 97   Temp 97.8 F (36.6 C) (Temporal)   Ht 5' 4.5 (1.638 m)   Wt 156 lb (70.8 kg)   SpO2 96%   BMI 26.36 kg/m   Wt Readings from Last 3 Encounters:  12/23/23 156 lb (70.8 kg)  06/23/23 177 lb 4 oz (80.4 kg)  06/20/23 177 lb (80.3 kg)      Physical Exam Constitutional:      General: She is not in acute distress.    Appearance: Normal appearance. She is well-developed. She is obese. She is not ill-appearing or toxic-appearing.  HENT:     Head: Normocephalic.     Right Ear: Hearing, tympanic membrane, ear canal and external ear normal. Tympanic membrane is not erythematous, retracted or bulging.     Left Ear: Hearing, tympanic membrane, ear canal and external ear normal. Tympanic membrane is not erythematous, retracted or bulging.     Nose: No mucosal edema or rhinorrhea.     Right Sinus: No maxillary sinus tenderness or frontal sinus tenderness.     Left Sinus: No maxillary sinus tenderness or frontal sinus tenderness.     Mouth/Throat:     Pharynx: Uvula midline.  Eyes:     General: Lids are normal.  Lids are everted, no foreign bodies appreciated.     Conjunctiva/sclera: Conjunctivae normal.     Pupils: Pupils are equal, round, and reactive to light.  Neck:     Thyroid : No thyroid  mass or thyromegaly.     Vascular: No carotid bruit.     Trachea: Trachea normal.  Cardiovascular:     Rate and Rhythm: Normal rate and regular rhythm.     Pulses: Normal pulses.     Heart sounds: Normal heart sounds, S1 normal and S2 normal. No murmur heard.    No friction rub. No gallop.  Pulmonary:  Effort: Pulmonary effort is normal. No tachypnea or respiratory distress.     Breath sounds: Normal breath sounds. No decreased breath sounds, wheezing, rhonchi or rales.  Abdominal:     General: Bowel sounds are normal.     Palpations: Abdomen is soft.     Tenderness: There is no abdominal tenderness.  Musculoskeletal:     Cervical back: Normal range of motion and neck supple.  Skin:    General: Skin is warm and dry.     Findings: No rash.  Neurological:     Mental Status: She is alert.  Psychiatric:        Mood and Affect: Mood is not anxious or depressed.        Speech: Speech normal.        Behavior: Behavior normal. Behavior is cooperative.        Thought Content: Thought content normal.        Judgment: Judgment normal.       Results for orders placed or performed in visit on 12/16/23  VITAMIN D  25 Hydroxy (Vit-D Deficiency, Fractures)   Collection Time: 12/16/23  7:56 AM  Result Value Ref Range   VITD 33.90 30.00 - 100.00 ng/mL  T3, free   Collection Time: 12/16/23  7:56 AM  Result Value Ref Range   T3, Free 2.9 2.3 - 4.2 pg/mL  T4, free   Collection Time: 12/16/23  7:56 AM  Result Value Ref Range   Free T4 0.72 0.60 - 1.60 ng/dL  TSH   Collection Time: 12/16/23  7:56 AM  Result Value Ref Range   TSH 1.11 0.35 - 5.50 uIU/mL  Lipid panel   Collection Time: 12/16/23  7:56 AM  Result Value Ref Range   Cholesterol 203 (H) 0 - 200 mg/dL   Triglycerides 865.9 0.0 - 149.0 mg/dL    HDL 50.29 >60.99 mg/dL   VLDL 73.1 0.0 - 59.9 mg/dL   LDL Cholesterol 872 (H) 0 - 99 mg/dL   Total CHOL/HDL Ratio 4    NonHDL 153.58   Comprehensive metabolic panel   Collection Time: 12/16/23  7:56 AM  Result Value Ref Range   Sodium 141 135 - 145 mEq/L   Potassium 3.7 3.5 - 5.1 mEq/L   Chloride 103 96 - 112 mEq/L   CO2 30 19 - 32 mEq/L   Glucose, Bld 82 70 - 99 mg/dL   BUN 17 6 - 23 mg/dL   Creatinine, Ser 9.05 0.40 - 1.20 mg/dL   Total Bilirubin 0.6 0.2 - 1.2 mg/dL   Alkaline Phosphatase 37 (L) 39 - 117 U/L   AST 10 0 - 37 U/L   ALT 9 0 - 35 U/L   Total Protein 6.6 6.0 - 8.3 g/dL   Albumin 4.3 3.5 - 5.2 g/dL   GFR 34.97 >39.99 mL/min   Calcium  9.6 8.4 - 10.5 mg/dL  Hemoglobin J8r   Collection Time: 12/16/23  7:56 AM  Result Value Ref Range   Hgb A1c MFr Bld 5.3 4.6 - 6.5 %     COVID 19 screen:  No recent travel or known exposure to COVID19 The patient denies respiratory symptoms of COVID 19 at this time. The importance of social distancing was discussed today.   Assessment and Plan   The patient's preventative maintenance and recommended screening tests for an annual wellness exam were reviewed in full today. Brought up to date unless services declined.  Counselled on the importance of diet, exercise, and its role in overall health  and mortality. The patient's FH and SH was reviewed, including their home life, tobacco status, and drug and alcohol status.   Vaccines:   COVID x 3, uptodate with Td, shingrix, flu. Consider Prevnar Pap/DVE:  2022 [facility]. will get records Mammo: Done in 2022 per GYN... will get records Colon:  10/2010,  Dr. Aneita, repeat in 10 years. DUE Smoking Status:none ETOH/ drug ldz:wnwz/wnwz  HIV screen:   refused per pt done in past.  Problem List Items Addressed This Visit     Essential hypertension   Chronic, Significant decrease with weight loss.  Losartan  HCTZ 50/12.5 mg daily GFiven low normal... stop second  Hydrochlorothiazide  12.5 mg daily       Hyperlipidemia    Chronic,improved control with lifestyle changes.      Hypothyroidism   Chronic, TSH controlled on NP thyroid   management by alternative med.       Moderate persistent asthma   Chronic, stable control using albuterol  as needed.   Followed by pulmonary in past.      Prediabetes   Resolved with weight loss.      Vitamin D  deficiency   Chronic, resolved on supplementation.      Other Visit Diagnoses       Routine general medical examination at a health care facility    -  Primary     Colon cancer screening       Relevant Orders   Cologuard       No orders of the defined types were placed in this encounter.    Greig Ring, MD

## 2023-12-23 NOTE — Assessment & Plan Note (Signed)
 Chronic,improved control with lifestyle changes.

## 2023-12-23 NOTE — Assessment & Plan Note (Addendum)
 Chronic, Significant decrease with weight loss.  Losartan  HCTZ 50/12.5 mg daily GFiven low normal... stop second Hydrochlorothiazide  12.5 mg daily

## 2023-12-23 NOTE — Assessment & Plan Note (Signed)
Chronic, stable control using albuterol as needed.   Followed by pulmonary in past.

## 2023-12-23 NOTE — Assessment & Plan Note (Signed)
 Chronic, resolved on supplementation.

## 2023-12-23 NOTE — Assessment & Plan Note (Signed)
 Resolved with weight loss.

## 2023-12-23 NOTE — Patient Instructions (Signed)
 Stop second tablet of hydrochlorothiazide .

## 2023-12-29 ENCOUNTER — Ambulatory Visit: Payer: Self-pay | Admitting: Family Medicine

## 2024-01-04 ENCOUNTER — Ambulatory Visit (INDEPENDENT_AMBULATORY_CARE_PROVIDER_SITE_OTHER): Admitting: Psychology

## 2024-01-04 DIAGNOSIS — F411 Generalized anxiety disorder: Secondary | ICD-10-CM | POA: Diagnosis not present

## 2024-01-04 DIAGNOSIS — F330 Major depressive disorder, recurrent, mild: Secondary | ICD-10-CM

## 2024-01-04 NOTE — Progress Notes (Signed)
 [facility] Counselor/Therapist Progress Note  Patient ID: Jaime Chapman, MRN: [id number],    Date: 01/04/2024  Time Spent: 9:02am-9:58am     Treatment Type: Individual Therapy  pt is seen for a virtual video visit via caregility.  Pt consents to telehealth session and is aware of limitations of telehealth visits. Pt joins from her car parked at home, reporting privacy, and counselor from her home office.    Reported Symptoms: pt reports anxiety and worry w/ health/mental of granddaughter, grandson and husband  Mental Status Exam: Appearance:  Well Groomed     Behavior: Appropriate  Motor: Normal  Speech/Language:  Clear and Coherent  Affect: Appropriate  Mood: anxious  Thought process: normal  Thought content:   WNL  Sensory/Perceptual disturbances:   WNL  Orientation: oriented to person, place, time/date, and situation  Attention: Good  Concentration: Good  Memory: WNL  Fund of knowledge:  Good  Insight:   Good  Judgment:  Good  Impulse Control: Good   Risk Assessment: Danger to Self:  No Self-injurious Behavior: No Danger to Others: No Duty to Warn:no Physical Aggression / Violence:No  Access to Firearms a concern: No  Gang Involvement:No   Subjective: Counselor assessed pt current functioning per pt report. Processed w/pt positives and stressors.  Reflected positive progress w/ her project and motivation for.  Explored w/ pt worries and normalized concerns having.  Discussed what's in her control and supports to assist.    Pt affect wnl.  Pt reports she felt good about completing her crotchet blanket and wants to work on another actor.  Pt reports on concerns and worries for her husband's health, her grandson working to become more independent and her granddaughter's mental health.  Pt reports unsure about going to get granddaughter for visit as really declined w/ functioning and working program.  Pt discussed supports w/her tx team there and  identifying plan.  Pt recognizing what's in her control and not expecting more from self. Pt reports she is in process of testing w/ [facility].    Interventions: Cognitive Behavioral Therapy and supportive and relaxation strategies.   Diagnosis:Generalized anxiety disorder  Mild episode of recurrent major depressive disorder  Plan: Pt to f/u w/ in 3 weeks.  Pt to f/u w/ PCP and [employer] as needed.       Individualized Treatment Plan Strengths: creative, enjoys gardening, enjoys her pets  Supports: close friends maintains contact with   Goal/Needs for Treatment:  In order of importance to patient 1) cope w/ depression and anxiety 2) --- 3) ---   Client Statement of Needs: Seeking safe space to talk through things. I need to be mentally stronger to deal with what is coming and insight to figure out what that looks like. Pt also would like to feel more ease despite stress.   Treatment Level:outpatient counseling  Symptoms:fatigue, negative self worth, anxiety, depressed moods  Client Treatment Preferences: counseling 1-2 times a month.   F/u w/ a psychiatrist.   Healthcare consumer's goal for treatment:  Counselor, Damien Herald, [facility] will support the patient's ability to achieve the goals identified. Cognitive Behavioral Therapy, Assertive Communication/Conflict Resolution Training, Relaxation Training, ACT, Humanistic and other evidenced-based practices will be used to promote progress towards healthy functioning.   Healthcare consumer will: Actively participate in therapy, working towards healthy functioning.    *Justification for Continuation/Discontinuation of Goal: R=Revised, O=Ongoing, A=Achieved, D=Discontinued  Goal 1) Increase awareness of thoughts and behaviors that impacting depression and  anxiety and increase healthier thought patterns, increase positive self care and effective coping skills. Baseline date 07/26/23:  Progress towards goal 40; How Often - Daily Target Date Goal Was reviewed Status Code Progress towards goal/Likert rating  07/25/24                Goal 2) Increase awareness of what's in her control, insight into her values and wants as confronts life stressors.   Baseline date 07/26/23: Progress towards goal 40; How Often - Daily Target Date Goal Was reviewed Status Code Progress towards goal/Likert rating  07/25/24           This plan has been reviewed and created by the following participants:  This plan will be reviewed at least every 12 months. Date Behavioral Health Clinician Date Guardian/Patient   07/26/23  [facility] 07/26/23 Verbal Consent Provided and request for electronic signature sent.    08/17/23  Electronic signature completed by pt                    Jaime Chapman, LCMHC

## 2024-01-05 ENCOUNTER — Ambulatory Visit: Payer: Self-pay | Admitting: Family Medicine

## 2024-01-05 LAB — COLOGUARD: COLOGUARD: NEGATIVE

## 2024-01-24 ENCOUNTER — Ambulatory Visit: Admitting: Psychology

## 2024-01-24 DIAGNOSIS — F411 Generalized anxiety disorder: Secondary | ICD-10-CM

## 2024-01-24 DIAGNOSIS — F330 Major depressive disorder, recurrent, mild: Secondary | ICD-10-CM

## 2024-01-24 NOTE — Progress Notes (Signed)
 [facility] Counselor/Therapist Progress Note  Patient ID: Jaime Chapman, MRN: [id number],    Date: 01/24/2024  Time Spent: 10:00am-10:51am     Treatment Type: Individual Therapy  pt is seen for a virtual video visit via caregility.  Pt consents to telehealth session and is aware of limitations of telehealth visits. Pt joins from her car parked at home, reporting privacy, and counselor from her home office.    Reported Symptoms: pt reports worry for kids/ganddaughter and some increased sadness.    Mental Status Exam: Appearance:  Well Groomed     Behavior: Appropriate  Motor: Normal  Speech/Language:  Clear and Coherent  Affect: Appropriate  Mood: anxious and sad  Thought process: normal  Thought content:   WNL  Sensory/Perceptual disturbances:   WNL  Orientation: oriented to person, place, time/date, and situation  Attention: Good  Concentration: Good  Memory: WNL  Fund of knowledge:  Good  Insight:   Good  Judgment:  Good  Impulse Control: Good   Risk Assessment: Danger to Self:  No Self-injurious Behavior: No Danger to Others: No Duty to Warn:no Physical Aggression / Violence:No  Access to Firearms a concern: No  Gang Involvement:No   Subjective: Counselor assessed pt current functioning per pt report. Processed w/pt positives, stressors and emotions. Explored concerns w/ daughter, mertha, granddaughter. Reflected positive boundaries she is keeping and acknowledging sad situation re: her kids/grandkids and not internalizing.  Discussed what's in her control and coping skills to assist her managing through stressors.   Pt affect wnl.  Pt reports worry for her granddaughter and continued issues she is having, despite her tx.  Pt reports on step son and her daughter who both reached out recent and their situations w/ addiction and homelessness.  Pt reports reports feeling heaviness with knowing not doing well and boundaries she is keeping.  Pt identified  some sadness creeping in.  Pt discussed walking for coping and her continue crocheting.   Interventions: Cognitive Behavioral Therapy and supportive and relaxation strategies.   Diagnosis:Generalized anxiety disorder  Mild episode of recurrent major depressive disorder  Plan: Pt to f/u w/ in 3 weeks.  Pt to f/u w/ PCP and [employer] as needed.       Individualized Treatment Plan Strengths: creative, enjoys gardening, enjoys her pets  Supports: close friends maintains contact with   Goal/Needs for Treatment:  In order of importance to patient 1) cope w/ depression and anxiety 2) --- 3) ---   Client Statement of Needs: Seeking safe space to talk through things. I need to be mentally stronger to deal with what is coming and insight to figure out what that looks like. Pt also would like to feel more ease despite stress.   Treatment Level:outpatient counseling  Symptoms:fatigue, negative self worth, anxiety, depressed moods  Client Treatment Preferences: counseling 1-2 times a month.   F/u w/ a psychiatrist.   Healthcare consumer's goal for treatment:  Counselor, Damien Herald, [facility] will support the patient's ability to achieve the goals identified. Cognitive Behavioral Therapy, Assertive Communication/Conflict Resolution Training, Relaxation Training, ACT, Humanistic and other evidenced-based practices will be used to promote progress towards healthy functioning.   Healthcare consumer will: Actively participate in therapy, working towards healthy functioning.    *Justification for Continuation/Discontinuation of Goal: R=Revised, O=Ongoing, A=Achieved, D=Discontinued  Goal 1) Increase awareness of thoughts and behaviors that impacting depression and anxiety and increase healthier thought patterns, increase positive self care and effective coping skills. Baseline date 07/26/23: Progress towards  goal 40; How Often - Daily Target Date Goal Was reviewed Status Code  Progress towards goal/Likert rating  07/25/24                Goal 2) Increase awareness of what's in her control, insight into her values and wants as confronts life stressors.   Baseline date 07/26/23: Progress towards goal 40; How Often - Daily Target Date Goal Was reviewed Status Code Progress towards goal/Likert rating  07/25/24           This plan has been reviewed and created by the following participants:  This plan will be reviewed at least every 12 months. Date Behavioral Health Clinician Date Guardian/Patient   07/26/23  [facility] Barbarann [facility] 07/26/23 Verbal Consent Provided and request for electronic signature sent.    08/17/23  Electronic signature completed by pt                    [name], LCMHC

## 2024-02-13 ENCOUNTER — Ambulatory Visit: Admitting: Psychology

## 2024-02-13 DIAGNOSIS — F330 Major depressive disorder, recurrent, mild: Secondary | ICD-10-CM

## 2024-02-13 DIAGNOSIS — F411 Generalized anxiety disorder: Secondary | ICD-10-CM

## 2024-02-13 NOTE — Progress Notes (Signed)
 [facility] Counselor/Therapist Progress Note  Patient ID: Jaime Chapman, MRN: [id number],    Date: 02/13/2024  Time Spent: 9:01am-9:58am     Treatment Type: Individual Therapy  pt is seen for a virtual video visit via caregility.  Pt consents to telehealth session and is aware of limitations of telehealth visits. Pt joins from her home, reporting privacy, and counselor from her home office.    Reported Symptoms: pt reports some worry for grandkids.  Pt reports positive engagement in activities.     Mental Status Exam: Appearance:  Well Groomed     Behavior: Appropriate  Motor: Normal  Speech/Language:  Clear and Coherent  Affect: Appropriate  Mood: anxious  Thought process: normal  Thought content:   WNL  Sensory/Perceptual disturbances:   WNL  Orientation: oriented to person, place, time/date, and situation  Attention: Good  Concentration: Good  Memory: WNL  Fund of knowledge:  Good  Insight:   Good  Judgment:  Good  Impulse Control: Good   Risk Assessment: Danger to Self:  No Self-injurious Behavior: No Danger to Others: No Duty to Warn:no Physical Aggression / Violence:No  Access to Firearms a concern: No  Gang Involvement:No   Subjective: Counselor assessed pt current functioning per pt report. Processed w/pt positives, stressors and emotions. Explored interactions w/ family.  Discussed worries for grand kids and how supporting w/ healthy boundaries.   Reflected positives of engagement in activities w/ crocheting.  Pt affect wnl.  Pt reports positive visit w/ her granddaughter recent.  Pt reports over past couple week not reports of concerns for behavior.  Pt reports some worry for grandson w/ seeming more withdrawn.  Pt discussed ways keeping open communication.  Pt reports holiday was mostly quiet and kept boundaries w/ adult children and chose not to engage w/ son in law.  Pt reports on plans for Christmas w/ granddaughter having a visit.   Pt has  been enjoying her crotcheting projects.    Interventions: Cognitive Behavioral Therapy and supportive and relaxation strategies.   Diagnosis:Generalized anxiety disorder  Mild episode of recurrent major depressive disorder  Plan: Pt to f/u w/ in 3 weeks.  Pt to f/u w/ PCP and [employer] as needed.       Individualized Treatment Plan Strengths: creative, enjoys gardening, enjoys her pets  Supports: close friends maintains contact with   Goal/Needs for Treatment:  In order of importance to patient 1) cope w/ depression and anxiety 2) --- 3) ---   Client Statement of Needs: Seeking safe space to talk through things. I need to be mentally stronger to deal with what is coming and insight to figure out what that looks like. Pt also would like to feel more ease despite stress.   Treatment Level:outpatient counseling  Symptoms:fatigue, negative self worth, anxiety, depressed moods  Client Treatment Preferences: counseling 1-2 times a month.   F/u w/ a psychiatrist.   Healthcare consumer's goal for treatment:  Counselor, Jaime Chapman, [facility] will support the patient's ability to achieve the goals identified. Cognitive Behavioral Therapy, Assertive Communication/Conflict Resolution Training, Relaxation Training, ACT, Humanistic and other evidenced-based practices will be used to promote progress towards healthy functioning.   Healthcare consumer will: Actively participate in therapy, working towards healthy functioning.    *Justification for Continuation/Discontinuation of Goal: R=Revised, O=Ongoing, A=Achieved, D=Discontinued  Goal 1) Increase awareness of thoughts and behaviors that impacting depression and anxiety and increase healthier thought patterns, increase positive self care and effective coping skills. Baseline date 07/26/23:  Progress towards goal 40; How Often - Daily Target Date Goal Was reviewed Status Code Progress towards goal/Likert rating  07/25/24                 Goal 2) Increase awareness of what's in her control, insight into her values and wants as confronts life stressors.   Baseline date 07/26/23: Progress towards goal 40; How Often - Daily Target Date Goal Was reviewed Status Code Progress towards goal/Likert rating  07/25/24           This plan has been reviewed and created by the following participants:  This plan will be reviewed at least every 12 months. Date Behavioral Health Clinician Date Guardian/Patient   07/26/23  [facility] Jaime Chapman 07/26/23 Verbal Consent Provided and request for electronic signature sent.    08/17/23  Electronic signature completed by pt                    Jaime Chapman, LCMHC

## 2024-03-07 ENCOUNTER — Ambulatory Visit: Admitting: Psychology

## 2024-03-07 DIAGNOSIS — F330 Major depressive disorder, recurrent, mild: Secondary | ICD-10-CM

## 2024-03-07 DIAGNOSIS — F411 Generalized anxiety disorder: Secondary | ICD-10-CM
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
## 2024-03-16 ENCOUNTER — Other Ambulatory Visit: Payer: Self-pay | Admitting: Family Medicine

## 2024-03-16 DIAGNOSIS — I10 Essential (primary) hypertension: Secondary | ICD-10-CM

## 2024-04-04 ENCOUNTER — Ambulatory Visit: Admitting: Psychology

## 2024-04-04 DIAGNOSIS — F330 Major depressive disorder, recurrent, mild: Secondary | ICD-10-CM

## 2024-04-04 DIAGNOSIS — F411 Generalized anxiety disorder: Secondary | ICD-10-CM
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
## 2024-04-24 ENCOUNTER — Ambulatory Visit: Admitting: Psychology
